# Patient Record
Sex: Female | Born: 1960
Health system: Southern US, Community
[De-identification: ages and names within clinical notes are randomized; demographics above are authoritative.]

## PROBLEM LIST (undated history)

## (undated) DIAGNOSIS — E785 Hyperlipidemia, unspecified: Secondary | ICD-10-CM

## (undated) DIAGNOSIS — M545 Low back pain: Secondary | ICD-10-CM

## (undated) DIAGNOSIS — M25519 Pain in unspecified shoulder: Secondary | ICD-10-CM

## (undated) DIAGNOSIS — G8929 Other chronic pain: Secondary | ICD-10-CM

## (undated) DIAGNOSIS — M25561 Pain in right knee: Secondary | ICD-10-CM

## (undated) DIAGNOSIS — E119 Type 2 diabetes mellitus without complications: Secondary | ICD-10-CM

## (undated) DIAGNOSIS — F329 Major depressive disorder, single episode, unspecified: Secondary | ICD-10-CM

## (undated) DIAGNOSIS — W19XXXA Unspecified fall, initial encounter: Secondary | ICD-10-CM

## (undated) DIAGNOSIS — E039 Hypothyroidism, unspecified: Secondary | ICD-10-CM

## (undated) DIAGNOSIS — G43709 Chronic migraine without aura, not intractable, without status migrainosus: Secondary | ICD-10-CM

## (undated) DIAGNOSIS — I10 Essential (primary) hypertension: Secondary | ICD-10-CM

## (undated) DIAGNOSIS — F32A Depression, unspecified: Secondary | ICD-10-CM

## (undated) DIAGNOSIS — M25562 Pain in left knee: Secondary | ICD-10-CM

## (undated) HISTORY — DX: Other chronic pain: G89.29

## (undated) HISTORY — DX: Unspecified fall, initial encounter: W19.XXXA

## (undated) HISTORY — DX: Pain in unspecified shoulder: M25.519

## (undated) HISTORY — DX: Hyperlipidemia, unspecified: E78.5

## (undated) HISTORY — DX: Low back pain: M54.5

## (undated) HISTORY — DX: Depression, unspecified: F32.A

## (undated) HISTORY — DX: Chronic migraine without aura, not intractable, without status migrainosus: G43.709

## (undated) HISTORY — PX: OTHER SURGICAL HISTORY: SHX169

## (undated) HISTORY — DX: Pain in left knee: M25.562

## (undated) HISTORY — DX: Pain in right knee: M25.561

---
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
## 1985-02-01 DIAGNOSIS — IMO0002 Reserved for concepts with insufficient information to code with codable children: Secondary | ICD-10-CM

## 1985-02-01 HISTORY — DX: Reserved for concepts with insufficient information to code with codable children: IMO0002

## 2009-10-08 ENCOUNTER — Emergency Department (HOSPITAL_COMMUNITY): Admission: EM | Admit: 2009-10-08 | Discharge: 2009-10-08 | Payer: Self-pay | Admitting: Emergency Medicine

## 2009-11-17 ENCOUNTER — Encounter: Admission: RE | Admit: 2009-11-17 | Discharge: 2009-11-17 | Payer: Self-pay | Admitting: Infectious Diseases

## 2009-11-25 ENCOUNTER — Emergency Department (HOSPITAL_COMMUNITY): Admission: EM | Admit: 2009-11-25 | Discharge: 2009-11-25 | Payer: Self-pay | Admitting: Family Medicine

## 2009-12-08 ENCOUNTER — Emergency Department (HOSPITAL_COMMUNITY): Admission: EM | Admit: 2009-12-08 | Discharge: 2009-12-08 | Payer: Self-pay | Admitting: Family Medicine

## 2010-03-09 ENCOUNTER — Inpatient Hospital Stay (INDEPENDENT_AMBULATORY_CARE_PROVIDER_SITE_OTHER)
Admission: RE | Admit: 2010-03-09 | Discharge: 2010-03-09 | Disposition: A | Discharge status: Home / Self Care | Payer: Medicaid Other | Source: Ambulatory Visit | Attending: Family Medicine | Admitting: Family Medicine

## 2010-03-19 ENCOUNTER — Inpatient Hospital Stay (INDEPENDENT_AMBULATORY_CARE_PROVIDER_SITE_OTHER)
Admission: RE | Admit: 2010-03-19 | Discharge: 2010-03-19 | Disposition: A | Discharge status: Home / Self Care | Payer: Medicaid Other | Source: Ambulatory Visit | Attending: Family Medicine | Admitting: Family Medicine

## 2010-03-19 DIAGNOSIS — M719 Bursopathy, unspecified: Secondary | ICD-10-CM

## 2010-05-11 ENCOUNTER — Inpatient Hospital Stay (INDEPENDENT_AMBULATORY_CARE_PROVIDER_SITE_OTHER)
Admission: RE | Admit: 2010-05-11 | Discharge: 2010-05-11 | Disposition: A | Discharge status: Home / Self Care | Payer: Self-pay | Source: Ambulatory Visit | Attending: Family Medicine | Admitting: Family Medicine

## 2010-05-11 DIAGNOSIS — D485 Neoplasm of uncertain behavior of skin: Secondary | ICD-10-CM

## 2010-05-11 DIAGNOSIS — M67919 Unspecified disorder of synovium and tendon, unspecified shoulder: Secondary | ICD-10-CM

## 2010-10-30 ENCOUNTER — Inpatient Hospital Stay (INDEPENDENT_AMBULATORY_CARE_PROVIDER_SITE_OTHER)
Admission: RE | Admit: 2010-10-30 | Discharge: 2010-10-30 | Disposition: A | Discharge status: Home / Self Care | Payer: Self-pay | Source: Ambulatory Visit | Attending: Emergency Medicine | Admitting: Emergency Medicine

## 2010-10-30 DIAGNOSIS — M25519 Pain in unspecified shoulder: Secondary | ICD-10-CM

## 2010-10-30 DIAGNOSIS — M549 Dorsalgia, unspecified: Secondary | ICD-10-CM

## 2010-10-30 DIAGNOSIS — M2550 Pain in unspecified joint: Secondary | ICD-10-CM

## 2010-10-30 LAB — POCT I-STAT, CHEM 8
Glucose, Bld: 85 mg/dL (ref 70–99)
HCT: 42 % (ref 36.0–46.0)
Hemoglobin: 14.3 g/dL (ref 12.0–15.0)
Potassium: 4.1 mEq/L (ref 3.5–5.1)
Sodium: 142 mEq/L (ref 135–145)
TCO2: 22 mmol/L (ref 0–100)

## 2011-07-12 ENCOUNTER — Emergency Department (HOSPITAL_COMMUNITY)
Admission: EM | Admit: 2011-07-12 | Discharge: 2011-07-13 | Disposition: A | Discharge status: Home / Self Care | Payer: Self-pay | Attending: Emergency Medicine | Admitting: Emergency Medicine

## 2011-07-12 ENCOUNTER — Encounter (HOSPITAL_COMMUNITY): Payer: Self-pay | Admitting: *Deleted

## 2011-07-12 DIAGNOSIS — M62838 Other muscle spasm: Secondary | ICD-10-CM | POA: Insufficient documentation

## 2011-07-12 DIAGNOSIS — M542 Cervicalgia: Secondary | ICD-10-CM | POA: Insufficient documentation

## 2011-07-12 DIAGNOSIS — F17200 Nicotine dependence, unspecified, uncomplicated: Secondary | ICD-10-CM | POA: Insufficient documentation

## 2011-07-12 MED ORDER — HYDROCODONE-ACETAMINOPHEN 5-325 MG PO TABS
1.0000 | ORAL_TABLET | Freq: Once | ORAL | Status: AC
  Administered 2011-07-12: 1 via ORAL
  Filled 2011-07-12: qty 1

## 2011-07-12 NOTE — ED Notes (Addendum)
C/o headache and neck pain, onset this am, (denies: injury, fever, dizziness, nvd, visual changes or other sx). No relief with ASA. No h/o same. (Declines translator at this time, son with pt and will translate). "Takes meloxicam for mouth and joint pain". Pinpoints pain to bilateral occipital & parietal areas.

## 2011-07-13 ENCOUNTER — Encounter (HOSPITAL_COMMUNITY): Payer: Self-pay | Admitting: *Deleted

## 2011-07-13 ENCOUNTER — Emergency Department (HOSPITAL_COMMUNITY): Payer: Self-pay

## 2011-07-13 ENCOUNTER — Emergency Department (HOSPITAL_COMMUNITY)
Admission: EM | Admit: 2011-07-13 | Discharge: 2011-07-13 | Disposition: A | Discharge status: Home / Self Care | Payer: Self-pay | Attending: Emergency Medicine | Admitting: Emergency Medicine

## 2011-07-13 DIAGNOSIS — R51 Headache: Secondary | ICD-10-CM | POA: Insufficient documentation

## 2011-07-13 DIAGNOSIS — R110 Nausea: Secondary | ICD-10-CM | POA: Insufficient documentation

## 2011-07-13 DIAGNOSIS — H53149 Visual discomfort, unspecified: Secondary | ICD-10-CM | POA: Insufficient documentation

## 2011-07-13 DIAGNOSIS — Z7982 Long term (current) use of aspirin: Secondary | ICD-10-CM | POA: Insufficient documentation

## 2011-07-13 MED ORDER — MECLIZINE HCL 32 MG PO TABS: 32.0000 mg | ORAL_TABLET | Freq: Three times a day (TID) | ORAL | Status: AC | PRN

## 2011-07-13 MED ORDER — DIPHENHYDRAMINE HCL 50 MG/ML IJ SOLN
25.0000 mg | Freq: Once | INTRAMUSCULAR | Status: AC
  Administered 2011-07-13: 25 mg via INTRAVENOUS
  Filled 2011-07-13: qty 1

## 2011-07-13 MED ORDER — IBUPROFEN 800 MG PO TABS: 800.0000 mg | ORAL_TABLET | Freq: Three times a day (TID) | ORAL | Status: AC

## 2011-07-13 MED ORDER — OXYCODONE-ACETAMINOPHEN 5-325 MG PO TABS: 2.0000 | ORAL_TABLET | ORAL | Status: DC | PRN

## 2011-07-13 MED ORDER — DIAZEPAM 5 MG PO TABS: 5.0000 mg | ORAL_TABLET | Freq: Three times a day (TID) | ORAL | Status: DC | PRN

## 2011-07-13 MED ORDER — MECLIZINE HCL 25 MG PO TABS
25.0000 mg | ORAL_TABLET | Freq: Once | ORAL | Status: AC
  Administered 2011-07-13: 25 mg via ORAL
  Filled 2011-07-13: qty 1

## 2011-07-13 MED ORDER — DIAZEPAM 5 MG PO TABS
5.0000 mg | ORAL_TABLET | Freq: Once | ORAL | Status: AC
  Administered 2011-07-13: 5 mg via ORAL
  Filled 2011-07-13: qty 1

## 2011-07-13 MED ORDER — DEXAMETHASONE SODIUM PHOSPHATE 10 MG/ML IJ SOLN
10.0000 mg | Freq: Once | INTRAMUSCULAR | Status: AC
  Administered 2011-07-13: 10 mg via INTRAVENOUS
  Filled 2011-07-13: qty 1

## 2011-07-13 MED ORDER — METOCLOPRAMIDE HCL 5 MG/ML IJ SOLN
10.0000 mg | Freq: Once | INTRAMUSCULAR | Status: AC
  Administered 2011-07-13: 10 mg via INTRAVENOUS
  Filled 2011-07-13: qty 2

## 2011-07-13 MED ORDER — IBUPROFEN 800 MG PO TABS
800.0000 mg | ORAL_TABLET | Freq: Once | ORAL | Status: AC
  Administered 2011-07-13: 800 mg via ORAL
  Filled 2011-07-13 (×2): qty 1

## 2011-07-13 NOTE — ED Provider Notes (Signed)
History     CSN: [id number]  Arrival date & time 07/13/11  1949   First MD Initiated Contact with Patient 07/13/11 2104      Chief Complaint  Patient presents with  . Headache    HPI  Hx provided by pt through a [place] interpreter.  Patient is a 51 year old female with prior history of migraine type headaches who presents with complaints of headache and dizziness. Patient states she was seen yesterday for similar symptoms of headache. Patient was treated and felt much better and returned home. Today headache has returned and is associated with symptoms of slight dizziness. Patient states she feels off balance as the surroundings were spinning. Symptoms are worse with movements and standing and walking. She does have some improvement with rest. Patient has long history of migraine type headaches for many years. Pain feels the same and was gradual. Patient denies any associated fever, chills, confusion, neck pain or stiffness, rash. Patient denies any speech change, weakness or paralysis in extremities. Patient has not taken anything for her symptoms. Symptoms are described as severe.    History reviewed. No pertinent past medical history.  History reviewed. No pertinent past surgical history.  No family history on file.  History  Substance Use Topics  . Smoking status: Current Some Day Smoker  . Smokeless tobacco: Not on file  . Alcohol Use: No    OB History    Grav Para Term Preterm Abortions TAB SAB Ect Mult Living                  Review of Systems  Constitutional: Negative for fever and chills.  Eyes: Positive for photophobia. Negative for pain, redness and visual disturbance.  Gastrointestinal: Positive for nausea. Negative for vomiting.  Skin: Negative for rash.  Neurological: Positive for dizziness and headaches. Negative for speech difficulty, weakness, light-headedness and numbness.  Psychiatric/Behavioral: Negative for confusion.    Allergies  Review of  patient's allergies indicates no known allergies.  Home Medications   Current Outpatient Rx  Name Route Sig Dispense Refill  . ASPIRIN 325 MG PO TABS Oral Take 650 mg by mouth every 6 (six) hours as needed. For headache    . DIAZEPAM 5 MG PO TABS Oral Take 5 mg by mouth every 8 (eight) hours as needed. For anxiety    . IBUPROFEN 800 MG PO TABS Oral Take 1 tablet (800 mg total) by mouth 3 (three) times daily. 21 tablet 0  . OXYCODONE-ACETAMINOPHEN 5-325 MG PO TABS Oral Take 2 tablets by mouth every 4 (four) hours as needed. For pain      BP 108/70  Pulse 56  Temp(Src) 97.4 F (36.3 C) (Oral)  Resp 18  SpO2 97%  LMP 06/27/2011  Physical Exam  Nursing note and vitals reviewed. Constitutional: She is oriented to person, place, and time. She appears well-developed and well-nourished. No distress.  HENT:  Head: Normocephalic and atraumatic.  Eyes: Conjunctivae and EOM are normal. Pupils are equal, round, and reactive to light.       No nystagmus.   Neck: Normal range of motion. Neck supple.  Cardiovascular: Normal rate and regular rhythm.   Pulmonary/Chest: Effort normal and breath sounds normal.  Abdominal: Soft.  Neurological: She is alert and oriented to person, place, and time. She has normal strength. No cranial nerve deficit or sensory deficit. Gait normal.  Skin: Skin is warm and dry. No rash noted.  Psychiatric: She has a normal mood and affect. Her behavior is  normal.    ED Course  Procedures    Ct Head Wo Contrast  07/13/2011  *RADIOLOGY REPORT*  Clinical Data: Headache  CT HEAD WITHOUT CONTRAST  Technique:  Contiguous axial images were obtained from the base of the skull through the vertex without contrast.  Comparison: None.  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 There is no evidence for acute hemorrhage, hydrocephalus, mass lesion, or abnormal extra-axial fluid collection.  No  definite CT evidence for acute infarction.  The visualized paranasal sinuses and mastoid air cells are predominately clear.  IMPRESSION: Mild white matter changes as above.  No definite acute intracranial abnormality.  Original Report Authenticated By: Waneta Martins, M.D.     1. Headache       MDM  Patient seen and evaluated. Patient no acute distress.   Patient feeling much better after medications. Patient ambulating well and on own without assistance     Angus Seller, [place] 07/15/11 [phone]

## 2011-07-13 NOTE — Discharge Instructions (Signed)
Muscle Spasm  Take medications as prescribed.  Warm moist heat, either from warm bath, hot water bottle, or heating pad to the spasm area will help with symptoms.  Expect to be sore for 7-10 days.  Follow up with your regular doctor.  If you do not have a doctor, follow up with one of the numbers listed.  Return to the ER for worsening pain, new weakness, numbness, loss of bowel or bladder control, or other concerning symptoms.  PSYCH ANXIOLYTICS BENZODIAZEPINES  PSYCH ANXIOLYTICS BENZODIAZEPINES: You have been prescribed a medication that belongs to a class called Benzodiazepines.      These medicines are used to treat anxiety (nervousness), tremors, seizures, vertigo, insomnia, nausea (especially that associated with chemotherapy), alcohol or sedative drug withdrawal, and muscle spasm; they may also be given (usually intravenously) in the ED for sedation during procedures. This medication is a "scheduled substance" that means it is against the law to share it or give it to anyone else.     You have been given a medication, or a prescription for a medication, that causes drowsiness or dizziness.  DO NOT drive a car, operate machinery, ride a bike, or perform jobs that require you to be alert until you know how you are going to react to this medicine.     Make sure your doctor knows if you have any of these conditions before you take this medication:  an alcohol or drug abuse problem, depression, glaucoma, kidney or liver disease, lung disease or breathing difficulties, myasthenia gravis, Parkinson's disease.     If you are on any of the following medications make sure that your doctor knows before you start this medication as they can cause some undesirable interactions:  Seizure medicines (used for convulsion or epilepsy), chloroquine, cimetidine (Tagamet), digoxin (Lanoxin), disulfiram (Antabuse), or erythromycin.     DO NOT drink alcoholic beverages while taking this medicine.     If you  become dizzy, sit or lie down at the first signs.  You should be careful going up and down stairs.     This medication may cause side-effects.  If they are bothersome, discontinue the medication.  If they are severe, follow-up with your physician or return to the Emergency Department for a recheck.  These side-effects include:  dizziness, depression, headaches, blurry vision, and problems sleeping. Tell your doctor if you are taking any of the following medicines:      DO NOT drink alcoholic beverages while taking this medicine.     Medications for your stomach, Cyclosporin, Medications for your heart or blood pressure, Diflucan, Theophylline, Isoniazid, Antibiotics, Migraine medicines, Medications for seizures, depression, or mental illness.     If you become dizzy, sit or lie down at the first signs.  You should be careful going up and down stairs. DO NOT take more of this medicine than prescribed.  Taking too much of this medicine can cause DEATH.      If you miss a dose do not "double up."  DO NOT take extra doses as this will not help you feel any better any faster.  It may even cause unwanted side-effects.     Contact your doctor immediately if you develop an allergic reaction, you feel lightheaded, confused, drowsy, or experience thoughts of hurting yourself or others.  Call also if you experience yellowing of the eyes or skin, or abnormal muscle twitching or movements.     DO NOT take this medication if you are pregnant or are nursing   or you are actively trying to become pregnant.     Keep this medication out of the reach of children.  Always keep this medication in child-proof containers.  DO NOT give your medication to anyone else. This drug may be habit-forming (addictive).  DO NOT use it for more than one week without discussing it with your regular doctor.  You have been given a medication, or a prescription for a medication, that causes drowsiness or dizziness.  DO NOT drive a  car, operate machinery, ride a bike, or perform jobs that require you to be alert until you know how you are going to react to this medicine.  THESE INSTRUCTIONS ARE NOT COMPREHENSIVE (complete):  Ask your pharmacist for additional information and precautions for this medication.   PAIN NSAID MOTRIN  PAIN NSAID MOTRIN: You have been given a medication that contains ibuprofen.     This medication is often used to relieve pain, reduce fever, reduce inflammation, or to help prevent the ureteral spasm and pain associated with kidney stones.    DO NOT take this medication if you  have stomach ulcers or are sensitive / allergic to ibuprofen.    DO NOT take this medication if you are taking other over-the-counter medications that contain ibuprofen.  Never take more of the medication than prescribed.  Overdosing of medication may cause damage to your kidneys.    If you have side-effects that you think are caused by this medicine, tell your doctor.  If you develop stomach pain, vomit blood, or have bowel movements that become black and tarry, discontinue the medication and notify your physician immediately.    This medication may upset your stomach.  Always take medication with milk or meals.    Keep this medication out of the reach of children.  Always keep this medication in child-proof containers.  DO NOT give your medication to anyone else. THESE INSTRUCTIONS ARE NOT COMPREHENSIVE (complete):  Ask your pharmacist for additional information and precautions for this medication.   PAIN ACETAMINOPHEN OXYCODONE  PAIN ACETAMINOPHEN OXYCODONE: You have been given a medication that contains acetaminophen and oxycodone.      This medication is used to relieve pain.     DO NOT take this medication if you have liver disease or drink alcohol on a daily basis.     DO NOT take this medication if you are taking other over-the-counter medications that contain Tylenol or acetaminophen (the active  ingredient in Tylenol).     If you have side-effects that you think are caused by this medicine, tell your doctor.     DO NOT drink alcoholic beverages while taking this medicine.     If you become dizzy, sit or lie down at the first signs.  You should be careful going up and down stairs.     If you are pregnant or breastfeeding, notify your doctor before taking this medication.     Keep this medication out of the reach of children.  Always keep this medication in child-proof containers.  DO NOT give your medication to anyone else. This medication can be HABIT-FORMING.  Discontinue use when no longer needed and never give this medication to others.  You have been given a medication, or a prescription for a medication, that causes drowsiness or dizziness.  DO NOT drive a car, operate machinery, or perform jobs that require you to be alert until you know how you are going to react to this medicine.  THESE INSTRUCTIONS ARE NOT COMPREHENSIVE (  complete):  Ask your pharmacist for additional information and precautions for this medication.   PAIN, GENERAL - WITH PAIN MEDICATION  PAIN, GENERAL: You have been seen today for treatment of your pain.  The physician who treated you did not feel that the cause of your pain was dangerous and felt it was OK to treat your symptoms.  You will be given a prescription for pain medication to treat your pain. Use the pain medication as needed for discomfort. It may be to your advantage to take the pain medications regularly, around the clock as directed for the next a few days. By doing this, you can build up a helpful amount of the medicine in your system.  YOU SHOULD SEEK MEDICAL ATTENTION IMMEDIATELY, EITHER HERE OR AT THE NEAREST EMERGENCY DEPARTMENT, IF ANY OF THE FOLLOWING OCCURS:      Your pain becomes worse, despite treatment with pain medications.     You develop any other significant symptoms.  MUSCLE STRAIN, GENERAL  MUSCLE STRAIN,  GENERAL: You have been diagnosed with a muscle strain.  Any muscle in the body can be strained. A strain is an injury to muscles where some of the muscle fibers are injured by being stretched or partially torn. This usually happens by overusing the muscle or performing an activity that the muscle is not used to doing.  Some of the symptoms of a strain include pain, muscle cramping, and soreness to the touch.  Often, the pain and stiffness in the muscle is worse the next day. This is much like what happens when a person begins exercising for the first time. After the exercise session, the person may feel pretty good, however the next day all of the exercised muscles feel stiff and sore.  The general treatment for a strain includes the following:      Resting the affected part.     Pain medication.     Muscle relaxant medications.     Warm compresses (such as a warm, moist towel).     Gentle stretching of the injured muscle.     And when tolerated, gentle massage of the affected area. This injury is self-limited (it gets better on its own) and rarely requires specific treatment.  YOU SHOULD SEEK MEDICAL ATTENTION IMMEDIATELY, EITHER HERE OR AT THE NEAREST EMERGENCY DEPARTMENT, IF ANY OF THE FOLLOWING OCCURS:      Significant increase in swelling of the affected area.     Worsening pain instead of gradual improvement.     Redness of the skin over the affected area.     Inability to use the affected limb. Weakness or numbness of the limb.  IMPORTANCE OF PRIMARY CARE DOCTOR (EDU)  IMPORTANCE OF PRIMARY CARE DOCTOR (EDU): You have been given instructions to follow up with a primary care physician.  A primary care physician is a doctor who helps with your health maintenance. For example, he or she provides yearly health exams to help determine your general well-being along with regular check-ups to help to identify potential health problems.  Your primary care physician serves as  a main resource on all aspects of your health. In addition to treating existing medical conditions, this physician monitors your health over time. Your primary doctor can help you to recognize symptoms, or changes in your body that could be signs of new illness. Primary care physicians can look at the big picture, including your lifestyle and family history. They can help plan the best ways of staying healthy and   leading a long, productive life. They are also an important part in making referrals to specialists (such as doctors who specialize in specific disease conditions such as diabetes, heart disease, etc.).  There are many types of physicians who provide primary care. They all offer the benefits of a lasting, personal relationship based upon mutual trust and a thorough knowledge of an individual person. They also provide a wide range of healthcare services.      Family Medicine physicians provide comprehensive care for all family members, from newborns through older adults.     Internal Medicine physicians specialize in meeting the complete healthcare needs of adults, from teenagers through seniors, providing both primary and advanced levels of care.     Obstetrician/gynecologists often serve as primary physicians for women, performing routine physicals and health screenings in addition to obstetrical and gynecological care.     Pediatricians are experts in primary care for children, usually from infancy through the teen years. Primary care doctors may be either MDs or DOs. With today's modern medical training, the differences between an MD (Medical Doctor) and a D.O. (Doctor of Osteopathic Medicine) are minimal. Both MD's and DOs go to medical school and complete residencies in various medical specialties.  If you do not have a primary care physician, it takes a little homework and determination on your part. There are several options available in selecting the most appropriate doctor for your  care. There are referrals lines in your local area as well as specialists that work with your specific health care plan. Many people find a physician through word-of-mouth, asking their friends, neighbors or relatives. There are also referral lines in your local area. Hospital physician referral services are also another option. Your health care plan may also offer referral services and most health plans offer the "Ask A Nurse" service. Referral services offer backgrounds of potential physicians, their educational and practice history, age range, office locations and hours, and the types of insurance coverage that they accept.  When you have decided which doctor may be right for you, make an appointment to ask questions about issues that are important to you. Frequently asked questions include the following:      Is the doctor on staff at a hospital? Which hospital?     What is the doctor's educational background?     Does the doctor specialize in certain areas of medicine?     How many years has his or her practice been established?     Is the doctor in practice by himself or herself, or in a group practice?     Is his or her office conveniently located?     What hours are available for appointments?     What types of insurance coverage does the doctor accept?     If you're on Medicare or Medicaid, does the doctor accept these plans?     How far in advance do you have to make an appointment? Are same-day appointments available?     How does the doctor handle situations when you need to see a doctor urgently?     What is the doctor's fee schedule? When is payment expected and how can it be made? When seeing a patient for the first time in a non-emergency situation, most doctors will begin a medical chart. This chart includes information about your health history. This record should include your present state of health, personal statistics (age, height, weight, occupation, whether you're a  smoker or non-smoker), and your   family history.  Establishing a GOOD RAPPORT (relationship) with your family doctor is EXTREMELY important! A PCP (Primary Care Physician) is the cornerstone of your care and should be the first person you call with any health concerns or problems. Being an established patient is VERY important so that you can be seen quickly when an illness or injury does occur. Plan ahead and make an appointment with your chosen physician to become an established patient of his or her practice.  If you develop symptoms of Shortness of Breath, Chest Pain, Swelling of lips, mouth or tongue or if your condition becomes worse with any new symptoms, see your doctor or return to the Emergency Department for immediate care. Emergency services are not intended to be a substitute for comprehensive medical attention.  Please contact your doctor for follow up if not improving as expected.   Call your doctor in 5-7 days or as directed if there is no improvement.   Community Resources: *IF YOU ARE IN IMMEDIATE DANGER CALL 911!  Abuse/Neglect:  Family Services Crisis Hotline ([place]): (336) 273-7273 [facility] Against Violence ([place]): (336) 342-3331  After hours, holidays and weekends: (336) 643-3000 National Domestic Violence Hotline: 800 799-7233  Mental Health: [place] [facility]: [address]: (336) 641-4993  Health Clinics:  Urgent [facility] Center ([facility]): [phone] Monday - Friday 8 AM - 9 PM, Saturday and Sunday 10 AM - 9 PM  [facility] [place]: [phone] Monday - Friday 8 AM - 5 PM  [facility]  [address]: [phone] Monday- Friday 8:30 AM - 5:30 PM, Sat 9 AM - 1 PM  [facility] [address]: [phone] [facility] [address]: [phone] [facility] [address]: [phone]  [facility]: [address] [phone]  [phone]  Cultures: If culture results are positive, we will notify you if a change in treatment is necessary.  LABORATORY TESTS:         If you had any labs drawn in the ED that have not resulted by the time you are discharged home, we will review these lab results and the treatment given to you.  If there is any further treatment or notification needed, we will contact you by phone, or letter.  "PLEASE ENSURE THAT YOU HAVE GIVEN US YOUR CURRENT WORKING PHONE NUMBER AND YOUR CURRENT ADDRESS, so that we can contact you if needed."  RADIOLOGY TESTS:  If the referred physician wants today's x-rays, please call the hospital's Radiology Department the day before your doctor's appointment. [place]     [phone] [place]   [phone] [place]     [phone]  Our doctors and staff appreciate your choosing us for your emergency medical care needs. We are here to serve you. 

## 2011-07-13 NOTE — ED Notes (Signed)
Headache for 2 days.  She was seen here in the ed for the same

## 2011-07-13 NOTE — Discharge Instructions (Signed)
You were seen and evaluated for your headache.  At this time your providers do not feel your symptoms are caused from any serious or concerning condition.  You may return home and follow up with your primary care provider.  If your develop any worsening symptoms or new concerning symptoms return to the emergency room.     Headache, General, Unknown Cause The specific cause of your headache may not have been found today. There are many causes and types of headache. A few common ones are:  Tension headache.   Migraine.   Infections (examples: dental and sinus infections).   Bone and/or joint problems in the neck or jaw.   Depression.   Eye problems.  These headaches are not life threatening.  Headaches can sometimes be diagnosed by a patient history and a physical exam. Sometimes, lab and imaging studies (such as x-ray and/or CT scan) are used to rule out more serious problems. In some cases, a spinal tap (lumbar puncture) may be requested. There are many times when your exam and tests may be normal on the first visit even when there is a serious problem causing your headaches. Because of that, it is very important to follow up with your doctor or local clinic for further evaluation. FINDING OUT THE RESULTS OF TESTS  If a radiology test was performed, a radiologist will review your results.   You will be contacted by the emergency department or your physician if any test results require a change in your treatment plan.   Not all test results may be available during your visit. If your test results are not back during the visit, make an appointment with your caregiver to find out the results. Do not assume everything is normal if you have not heard from your caregiver or the medical facility. It is important for you to follow up on all of your test results.  HOME CARE INSTRUCTIONS   Keep follow-up appointments with your caregiver, or any specialist referral.   Only take over-the-counter  or prescription medicines for pain, discomfort, or fever as directed by your caregiver.   Biofeedback, massage, or other relaxation techniques may be helpful.   Ice packs or heat applied to the head and neck can be used. Do this three to four times per day, or as needed.   Call your doctor if you have any questions or concerns.   If you smoke, you should quit.  SEEK MEDICAL CARE IF:   You develop problems with medications prescribed.   You do not respond to or obtain relief from medications.   You have a change from the usual headache.   You develop nausea or vomiting.  SEEK IMMEDIATE MEDICAL CARE IF:   If your headache becomes severe.   You have an unexplained oral temperature above 102 F (38.9 C), or as your caregiver suggests.   You have a stiff neck.   You have loss of vision.   You have muscular weakness.   You have loss of muscular control.   You develop severe symptoms different from your first symptoms.   You start losing your balance or have trouble walking.   You feel faint or pass out.  MAKE SURE YOU:   Understand these instructions.   Will watch your condition.   Will get help right away if you are not doing well or get worse.  Document Released: 01/18/2005 Document Revised: 01/07/2011 Document Reviewed: 09/07/2007 [facility] Patient Information 2012 [place], [place].  RESOURCE GUIDE  Chronic Pain Problems: Contact [facility]  [phone] Patients need to be referred by their primary care doctor.  Insufficient Money for Medicine: Contact United Way:  call "211" or [facility] [phone].  No Primary Care Doctor: - Call [facility]  [phone] - can help you locate a primary care doctor that  accepts your insurance, provides certain services, etc. - Physician Referral Service- [phone]  Agencies that provide inexpensive medical care: - [facility]  [phone] - [facility]   [phone] - [facility]  [phone] - Women's Clinic  [phone] - Planned Parenthood  [phone] [facility]  [phone]  Medicaid-accepting [facility] [place] Providers: - [facility]- [address]  [phone], Mon-Fri 9am-7pm, Sat 9am-1pm - [facility]- [address] [place], [address] [place]  [phone] - [facility] [address] [place]  [phone] [facility]- [address]  [phone] - Renaye Rakers- [address] [place], [address], [phone]  Only accepts [place] patients after they have their name  applied to their card  Self Pay (no insurance) in [place]: - Sickle Cell Patients: Dr Willey Blade, [facility] Internal [facility]  [address] [place], [phone] - [facility]- [address] [place]  [phone]       [facility] [place]- [address]       -     [facility]- see information above (Speak to [employer] if you do not have insurance)       -  [facility]- [address] [place], [phone]       -  [facility]- [address],  [phone]       -  [facility]- [address] [phone]       -  Dr Julio Sicks-  [address], [place], [phone]       -  [facility]- [address] [phone]       -  [facility]- [address], [phone], also [address] [phone]       -    [facility]- [address] [place], [phone], 1st & 3rd Saturday   every month, 10am-1pm  1) Find a Doctor and Pay Out of Pocket Although you won't have to find out who is covered by your insurance plan, it is a good idea to ask around and get recommendations. You will then need to call the office and see if the doctor you have chosen will accept you as a new patient and what types of options they offer for patients who are self-pay.  Some doctors offer discounts or will set up payment plans for their patients who do not have insurance, but you will need to ask so you aren't surprised when you get to your appointment.  2) Contact Your Local Health Department Not all health departments have doctors that can see patients for sick visits, but many do, so it is worth a call to see if yours does. If you don't know where your local health department is, you can check in your phone book. The CDC also has a tool to help you locate your state's health department, and many state websites also  have listings of all of their local health departments.  3) Find a Walk-in Clinic If your illness is not likely to be very severe or complicated, you may want to try a walk in clinic. These are popping up all over the country in pharmacies, drugstores, and shopping centers. They're usually staffed by nurse practitioners or physician assistants that have been trained to treat common illnesses and complaints. They're usually fairly quick and inexpensive. However, if you have serious medical issues or chronic medical problems, these are probably not your best option  STD Testing - [facility] Department of [facility], STD Clinic, [address], [place], phone [phone] or [phone].  Monday - Friday, call for an appointment. [facility] Department of [employer], STD Clinic, [address], [place], phone [phone] or [phone].  Monday - Friday, call for an appointment.  Abuse/Neglect: [facility] Child Abuse Hotline 915 790 4646 [facility] Child Abuse Hotline (267) 205-2676 (After Hours)  Emergency Shelter:  [facility] 2165060797  Maternity Homes: - [facility] [place] [facility] [phone] - [facility] 8545324285  MRSA Hotline #:   310-732-6061  [facility] Resources  [facility] [place]  [facility] Dept. [address]                 [address]         [address] [place] [address]  Blondell Reveal Phone:  [phone]                                  Phone:  [phone]                   Phone:  [phone]  [facility] Mental Health, [phone] - [phone]       -     [facility] in [place], [address]                                  [phone], [facility] Child Abuse Hotline 978-337-3174 or 225-271-2191 (After Hours)   Behavioral Health Services  Substance Abuse Resources: - [facility] Drug [facility]  [phone] - [facility] [phone] - [facility] [place] [phone] Floydene Flock (725)[phone] - Residential & Outpatient Substance Abuse Program  ([phone]  Psychological Services: [facility]  [phone] Services  [phone] - [facility], [phone] [place]. [address], [place], ACCESS LINE: [phone] or 9592225346, [employer]  Dental Assistance  If unable to pay or uninsured, contact:  [facility] or [facility]. to become qualified for the adult dental clinic.  Patients with Medicaid: [facility] [phone] [address], [phone] [address] [phone]  If unable  to pay, or uninsured, contact [facility] 7203906994) or [facility] Department [phone] in [place], [phone] in [facility]) to become qualified for the adult dental clinic  Other Low-Cost Community Dental Services: - [facility]- [address] [place], [place], [place], [address], [phone], [phone], 2nd and 4th Thursday of the month at 6:30am.  10 clients each day by appointment, can sometimes see walk-in patients if someone does not show for  an appointment. [facility]- [address] [place] Griffins [place], [place], [address], [phone] - [facility]- [address], [place], [place], [address], [phone] - [facility]- [phone] [facility]- [phone] [facility] Department- [phone]

## 2011-07-13 NOTE — ED Notes (Signed)
Pt ambulated in hall with out assistance.  C/o slight HA per her son as pt is non english speaking.

## 2011-07-16 NOTE — ED Provider Notes (Signed)
Medical screening examination/treatment/procedure(s) were performed by non-physician practitioner and as supervising physician I was immediately available for consultation/collaboration.  Juliet Rude. Rubin Payor, MD 07/16/11 [phone]

## 2011-07-16 NOTE — ED Provider Notes (Signed)
History     CSN: [id number]  Arrival date & time 07/12/11  2146   First MD Initiated Contact with Patient 07/13/11 0052      Chief Complaint  Patient presents with  . Headache  . Neck Pain    (Consider location/radiation/quality/duration/timing/severity/associated sxs/prior treatment) HPI 51 yo female presents to the ER with complaint of right sided neck pain and headache.  Pt has take meloxicam for pain.  Pt does not speak english, family member at bedside translating.  Pt refuses phone translator.  No fever, chills, no sore throat, uri sxs.  Pain with movement of the next but no rigidity.  No trauma. History reviewed. No pertinent past medical history.  History reviewed. No pertinent past surgical history.  History reviewed. No pertinent family history.  History  Substance Use Topics  . Smoking status: Current Some Day Smoker  . Smokeless tobacco: Not on file  . Alcohol Use: No    OB History    Grav Para Term Preterm Abortions TAB SAB Ect Mult Living                  Review of Systems  Unable to perform ROS: Other    Allergies  Review of patient's allergies indicates no known allergies.  Home Medications   Current Outpatient Rx  Name Route Sig Dispense Refill  . ASPIRIN 325 MG PO TABS Oral Take 650 mg by mouth every 6 (six) hours as needed. For headache    . DIAZEPAM 5 MG PO TABS Oral Take 5 mg by mouth every 8 (eight) hours as needed. For anxiety    . IBUPROFEN 800 MG PO TABS Oral Take 1 tablet (800 mg total) by mouth 3 (three) times daily. 21 tablet 0  . MECLIZINE HCL 32 MG PO TABS Oral Take 1 tablet (32 mg total) by mouth 3 (three) times daily as needed. 30 tablet 0  . OXYCODONE-ACETAMINOPHEN 5-325 MG PO TABS Oral Take 2 tablets by mouth every 4 (four) hours as needed. For pain      BP 140/59  Pulse 57  Temp 98.1 F (36.7 C) (Oral)  Resp 18  SpO2 99%  LMP 06/27/2011  Physical Exam  Nursing note and vitals reviewed. Constitutional: She is oriented  to person, place, and time. She appears well-developed and well-nourished.  HENT:  Head: Normocephalic and atraumatic.  Right Ear: External ear normal.  Left Ear: External ear normal.  Nose: Nose normal.  Mouth/Throat: Oropharynx is clear and moist.  Eyes: Conjunctivae and EOM are normal. Pupils are equal, round, and reactive to light.  Neck: Normal range of motion. Neck supple. No JVD present. No tracheal deviation present. No thyromegaly present.       Tenderness with palpation of SCM, trapezius muscles bilaterally no midline tenderness or crepitus, no nuchal rigidity.  Cardiovascular: Normal rate, regular rhythm, normal heart sounds and intact distal pulses.  Exam reveals no gallop and no friction rub.   No murmur heard. Pulmonary/Chest: Effort normal and breath sounds normal. No stridor. No respiratory distress. She has no wheezes. She has no rales. She exhibits no tenderness.  Abdominal: Soft. Bowel sounds are normal. She exhibits no distension and no mass. There is no tenderness. There is no rebound and no guarding.  Musculoskeletal: Normal range of motion. She exhibits no edema and no tenderness.  Lymphadenopathy:    She has no cervical adenopathy.  Neurological: She is oriented to person, place, and time. She has normal reflexes. No cranial nerve deficit. She  exhibits normal muscle tone. Coordination normal.  Skin: Skin is dry. No rash noted. No erythema. No pallor.  Psychiatric: She has a normal mood and affect. Her behavior is normal. Judgment and thought content normal.    ED Course  Procedures (including critical care time)  Labs Reviewed - No data to display No results found.   1. Trapezius muscle spasm   2. Neck pain       MDM  Pt feeling better after medications, wishes to go home.  Will have her f/u with pcm       Olivia Mackie, MD 07/16/11 1529

## 2011-07-20 NOTE — ED Notes (Addendum)
Medication strength changed to Antivert 25 mg TID  Per Roselyn Meier. Called by Purnell Shoemaker PFM to Pharmacy

## 2012-07-17 ENCOUNTER — Encounter (HOSPITAL_COMMUNITY): Payer: Self-pay

## 2012-07-17 ENCOUNTER — Emergency Department (HOSPITAL_COMMUNITY)
Admission: EM | Admit: 2012-07-17 | Discharge: 2012-07-17 | Disposition: A | Discharge status: Home / Self Care | Payer: No Typology Code available for payment source | Source: Home / Self Care | Attending: Family Medicine | Admitting: Family Medicine

## 2012-07-17 DIAGNOSIS — G43909 Migraine, unspecified, not intractable, without status migrainosus: Secondary | ICD-10-CM

## 2012-07-17 MED ORDER — AMITRIPTYLINE HCL 50 MG PO TABS: 50.0000 mg | ORAL_TABLET | Freq: Every day | ORAL | Status: DC

## 2012-07-17 MED ORDER — METHYLPREDNISOLONE ACETATE 40 MG/ML IJ SUSP
80.0000 mg | Freq: Once | INTRAMUSCULAR | Status: AC
  Administered 2012-07-17: 80 mg via INTRAMUSCULAR

## 2012-07-17 MED ORDER — METHYLPREDNISOLONE ACETATE 80 MG/ML IJ SUSP
INTRAMUSCULAR | Status: AC
  Filled 2012-07-17: qty 1

## 2012-07-17 NOTE — ED Provider Notes (Signed)
History     CSN: [id number]  Arrival date & time 07/17/12  1901   First MD Initiated Contact with Patient 07/17/12 1936      Chief Complaint  Patient presents with  . Headache    (Consider location/radiation/quality/duration/timing/severity/associated sxs/prior treatment) Patient is a 52 y.o. female presenting with headaches. The history is provided by the patient.  Headache Pain location:  Generalized Quality:  Stabbing Radiates to:  Does not radiate Progression:  Unchanged Chronicity:  Chronic Similar to prior headaches: yes (15-20 yrs having headache problem)   Context: bright light   Relieved by:  NSAIDs Ineffective treatments:  None tried Associated symptoms: dizziness and photophobia   Associated symptoms: no back pain, no blurred vision, no fever, no nausea, no neck pain and no sinus pressure     History reviewed. No pertinent past medical history.  History reviewed. No pertinent past surgical history.  History reviewed. No pertinent family history.  History  Substance Use Topics  . Smoking status: Current Some Day Smoker  . Smokeless tobacco: Not on file  . Alcohol Use: No    OB History   Grav Para Term Preterm Abortions TAB SAB Ect Mult Living                  Review of Systems  Constitutional: Negative.  Negative for fever.  HENT: Negative for neck pain and sinus pressure.   Eyes: Positive for photophobia. Negative for blurred vision.  Gastrointestinal: Negative.  Negative for nausea.  Musculoskeletal: Negative for back pain.  Neurological: Positive for dizziness and headaches.    Allergies  Review of patient's allergies indicates no known allergies.  Home Medications   Current Outpatient Rx  Name  Route  Sig  Dispense  Refill  . amitriptyline (ELAVIL) 50 MG tablet   Oral   Take 1 tablet (50 mg total) by mouth at bedtime. As needed for headache   30 tablet   0   . aspirin 325 MG tablet   Oral   Take 650 mg by mouth every 6 (six) hours  as needed. For headache           BP 128/68  Pulse 88  Temp(Src) 98.1 F (36.7 C) (Oral)  Resp 16  SpO2 100%  Physical Exam  Nursing note and vitals reviewed. Constitutional: She is oriented to person, place, and time. She appears well-developed and well-nourished. No distress.  HENT:  Head: Normocephalic.  Right Ear: External ear normal.  Left Ear: External ear normal.  Mouth/Throat: Oropharynx is clear and moist.  Neck: Normal range of motion. Neck supple.  Musculoskeletal: Normal range of motion.  Lymphadenopathy:    She has no cervical adenopathy.  Neurological: She is alert and oriented to person, place, and time. No cranial nerve deficit. Coordination normal.  Skin: Skin is warm and dry.    ED Course  Procedures (including critical care time)  Labs Reviewed - No data to display No results found.   1. Migraine headache       MDM         Linna Hoff, MD 07/17/12 2021

## 2012-07-17 NOTE — ED Notes (Signed)
Reports HA onset today ; sensitivity to light, sounds; has not used any medication for her HA; son here with her

## 2012-08-18 ENCOUNTER — Ambulatory Visit: Payer: No Typology Code available for payment source | Attending: Family Medicine | Admitting: Family Medicine

## 2012-08-18 ENCOUNTER — Encounter: Payer: Self-pay | Admitting: Family Medicine

## 2012-08-18 VITALS — BP 148/84 | HR 91 | Temp 98.2°F | Resp 18 | Wt 150.8 lb

## 2012-08-18 DIAGNOSIS — R519 Headache, unspecified: Secondary | ICD-10-CM | POA: Insufficient documentation

## 2012-08-18 DIAGNOSIS — I10 Essential (primary) hypertension: Secondary | ICD-10-CM | POA: Insufficient documentation

## 2012-08-18 DIAGNOSIS — M545 Low back pain: Secondary | ICD-10-CM | POA: Insufficient documentation

## 2012-08-18 DIAGNOSIS — M542 Cervicalgia: Secondary | ICD-10-CM

## 2012-08-18 DIAGNOSIS — R51 Headache: Secondary | ICD-10-CM | POA: Insufficient documentation

## 2012-08-18 LAB — COMPLETE METABOLIC PANEL WITH GFR
AST: 24 U/L (ref 0–37)
Albumin: 4.3 g/dL (ref 3.5–5.2)
BUN: 12 mg/dL (ref 6–23)
Calcium: 9.2 mg/dL (ref 8.4–10.5)
Chloride: 105 mEq/L (ref 96–112)
Creat: 0.74 mg/dL (ref 0.50–1.10)
GFR, Est African American: >89 mL/min
GFR, Est Non African American: >89 mL/min
Glucose, Bld: 132 mg/dL — ABNORMAL HIGH (ref 70–99)
Potassium: 4.1 mEq/L (ref 3.5–5.3)

## 2012-08-18 LAB — CBC
HCT: 36.4 % (ref 36.0–46.0)
Hemoglobin: 12.3 g/dL (ref 12.0–15.0)
MCV: 79.6 fL (ref 78.0–100.0)
RDW: 14.7 % (ref 11.5–15.5)
WBC: 9.4 10*3/uL (ref 4.0–10.5)

## 2012-08-18 LAB — LIPID PANEL
HDL: 51 mg/dL (ref >39)
LDL Cholesterol: 67 mg/dL (ref 0–99)
Triglycerides: 342 mg/dL — ABNORMAL HIGH (ref <150)

## 2012-08-18 MED ORDER — CYCLOBENZAPRINE HCL 5 MG PO TABS: 5.0000 mg | ORAL_TABLET | Freq: Every evening | ORAL | Status: DC | PRN

## 2012-08-18 MED ORDER — HYDROCHLOROTHIAZIDE 12.5 MG PO TABS: 12.5000 mg | ORAL_TABLET | Freq: Every day | ORAL | Status: DC

## 2012-08-18 NOTE — Progress Notes (Signed)
Patient here to establish care Has a history of headaches Both ears are blocked

## 2012-08-18 NOTE — Progress Notes (Signed)
Patient ID: April Mcmahon, female   DOB: 1960/02/03, 52 y.o.   MRN: [id number]  CC:  Chief Complaint  Patient presents with  . Establish Care   Interpreter used for communication  HPI Pt is presenting to establish care.  PT is reporting that she has been having chronic headaches and pain behind her eyes.  She has not had an eye exam and has been told by other providers that she needed to have an eye exam but has not been able to do so. She does not have a diagnosis in the past of hypertension but does say that she has been using herbal and natural substances to lower blood pressure at home.   No Known Allergies History reviewed. No pertinent past medical history. Current Outpatient Prescriptions on File Prior to Visit  Medication Sig Dispense Refill  . aspirin 325 MG tablet Take 650 mg by mouth every 6 (six) hours as needed. For headache       No current facility-administered medications on file prior to visit.   History reviewed. No pertinent family history. History   Social History  . Marital Status: Married    Spouse Name: N/A    Number of Children: N/A  . Years of Education: N/A   Occupational History  . Not on file.   Social History Main Topics  . Smoking status: Current Some Day Smoker  . Smokeless tobacco: Not on file  . Alcohol Use: No  . Drug Use: No  . Sexually Active:    Other Topics Concern  . Not on file   Social History Narrative  . No narrative on file    Review of Systems  Constitutional: Negative for fever, chills, diaphoresis, activity change, appetite change and fatigue.  HENT: Negative for ear pain, nosebleeds, congestion, facial swelling, rhinorrhea, neck pain, neck stiffness and ear discharge.   Eyes: positive for pain and visual disturbance. Negative for discharge, redness, itching.  Respiratory: Negative for cough, choking, chest tightness, shortness of breath, wheezing and stridor.   Cardiovascular: Negative for chest pain, palpitations and leg  swelling.  Gastrointestinal: Negative for abdominal distention.  Genitourinary: Negative for dysuria, urgency, frequency, hematuria, flank pain, decreased urine volume, difficulty urinating and dyspareunia.  Musculoskeletal: Negative for back pain, joint swelling, arthralgias and gait problem.  Neurological: Negative for dizziness, tremors, seizures, syncope, facial asymmetry, speech difficulty, weakness, light-headedness, numbness and positive for headaches.  Hematological: Negative for adenopathy. Does not bruise/bleed easily.  Psychiatric/Behavioral: Negative for hallucinations, behavioral problems, confusion, dysphoric mood, decreased concentration and agitation.    Objective:   Filed Vitals:   08/18/12 1637  BP: 148/84  Pulse: 91  Temp: 98.2 F (36.8 C)  Resp: 18    Physical Exam  Constitutional: Appears well-developed and well-nourished. No distress.  HENT: Normocephalic. External right and left ear normal. Oropharynx is clear and moist.  Eyes: Conjunctivae and EOM are normal. PERRLA, no scleral icterus.  Neck: Normal ROM. Neck supple. No JVD. No tracheal deviation. No thyromegaly.  CVS: RRR, S1/S2 +, no murmurs, no gallops, no carotid bruit.  Pulmonary: Effort and breath sounds normal, no stridor, rhonchi, wheezes, rales.  Abdominal: Soft. BS +,  no distension, tenderness, rebound or guarding.  Musculoskeletal: Normal range of motion. No edema and no tenderness.  Lymphadenopathy: No lymphadenopathy noted, cervical, inguinal. Neuro: Alert. Normal reflexes, muscle tone coordination. No cranial nerve deficit. Skin: Skin is warm and dry. No rash noted. Not diaphoretic. No erythema. No pallor.  Psychiatric: Normal mood and affect. Behavior, judgment,  thought content normal.   Lab Results  Component Value Date   HGB 14.3 10/30/2010   HCT 42.0 10/30/2010   Lab Results  Component Value Date   CREATININE 0.60 10/30/2010   BUN 9 10/30/2010   NA 142 10/30/2010   K 4.1 10/30/2010    CL 107 10/30/2010    No results found for this basename: HGBA1C   Lipid Panel  No results found for this basename: chol, trig, hdl, cholhdl, vldl, ldlcalc       Assessment and plan:   Patient Active Problem List   Diagnosis Date Noted  . Unspecified essential hypertension 08/18/2012  . Headache(784.0) 08/18/2012  . Low back pain 08/18/2012  . Neck pain 08/18/2012   HCTZ 12.5 mg po daily to treat hypertension.  Recommended to family and patient urgent eye appointment for eye exam to rule out glaucoma and visual deficits. Family says they will take patient to a local optometrist for evaluation asap.   Check labs today  The patient was given clear instructions to go to ER or return to medical center if symptoms don't improve, worsen or new problems develop.  The patient verbalized understanding.  The patient was told to call to get any lab results if not heard anything in the next week.    Follow up in 3 weeks  Rodney Langton, MD, CDE, FAAFP [facility] [place], [place] .  Results for orders placed in visit on 08/18/12  LIPID PANEL      Result Value Range   Cholesterol 186  0 - 200 mg/dL   Triglycerides 161 (*) <150 mg/dL   HDL 51  >09 mg/dL   Total CHOL/HDL Ratio 3.6     VLDL 68 (*) 0 - 40 mg/dL   LDL Cholesterol 67  0 - 99 mg/dL  CBC      Result Value Range   WBC 9.4  4.0 - 10.5 K/uL   RBC 4.57  3.87 - 5.11 MIL/uL   Hemoglobin 12.3  12.0 - 15.0 g/dL   HCT 60.4  54.0 - 98.1 %   MCV 79.6  78.0 - 100.0 fL   MCH 26.9  26.0 - 34.0 pg   MCHC 33.8  30.0 - 36.0 g/dL   RDW 19.1  47.8 - 29.5 %   Platelets 221  150 - 400 K/uL  COMPLETE METABOLIC PANEL WITH GFR      Result Value Range   Sodium 139  135 - 145 mEq/L   Potassium 4.1  3.5 - 5.3 mEq/L   Chloride 105  96 - 112 mEq/L   CO2 24  19 - 32 mEq/L   Glucose, Bld 132 (*) 70 - 99 mg/dL   BUN 12  6 - 23 mg/dL   Creat 6.21  3.08 - 6.57 mg/dL   Total Bilirubin 0.3  0.3 - 1.2 mg/dL   Alkaline  Phosphatase 97  39 - 117 U/L   AST 24  0 - 37 U/L   ALT 25  0 - 35 U/L   Total Protein 7.2  6.0 - 8.3 g/dL   Albumin 4.3  3.5 - 5.2 g/dL   Calcium 9.2  8.4 - 84.6 mg/dL   GFR, Est African American >89     GFR, Est Non African American >89

## 2012-08-18 NOTE — Patient Instructions (Addendum)
Please go to Eye doctor to have an eye exam as soon as possible   Headaches, Frequently Asked Questions MIGRAINE HEADACHES Q: What is migraine? What causes it? How can I treat it? A: Generally, migraine headaches begin as a dull ache. Then they develop into a constant, throbbing, and pulsating pain. You may experience pain at the temples. You may experience pain at the front or back of one or both sides of the head. The pain is usually accompanied by a combination of:  Nausea.  Vomiting.  Sensitivity to light and noise. Some people (about 15%) experience an aura (see below) before an attack. The cause of migraine is believed to be chemical reactions in the brain. Treatment for migraine may include over-the-counter or prescription medications. It may also include self-help techniques. These include relaxation training and biofeedback.  Q: What is an aura? A: About 15% of people with migraine get an "aura". This is a sign of neurological symptoms that occur before a migraine headache. You may see wavy or jagged lines, dots, or flashing lights. You might experience tunnel vision or blind spots in one or both eyes. The aura can include visual or auditory hallucinations (something imagined). It may include disruptions in smell (such as strange odors), taste or touch. Other symptoms include:  Numbness.  A "pins and needles" sensation.  Difficulty in recalling or speaking the correct word. These neurological events may last as long as 60 minutes. These symptoms will fade as the headache begins. Q: What is a trigger? A: Certain physical or environmental factors can lead to or "trigger" a migraine. These include:  Foods.  Hormonal changes.  Weather.  Stress. It is important to remember that triggers are different for everyone. To help prevent migraine attacks, you need to figure out which triggers affect you. Keep a headache diary. This is a good way to track triggers. The diary will help you  talk to your healthcare professional about your condition. Q: Does weather affect migraines? A: Bright sunshine, hot, humid conditions, and drastic changes in barometric pressure may lead to, or "trigger," a migraine attack in some people. But studies have shown that weather does not act as a trigger for everyone with migraines. Q: What is the link between migraine and hormones? A: Hormones start and regulate many of your body's functions. Hormones keep your body in balance within a constantly changing environment. The levels of hormones in your body are unbalanced at times. Examples are during menstruation, pregnancy, or menopause. That can lead to a migraine attack. In fact, about three quarters of all women with migraine report that their attacks are related to the menstrual cycle.  Q: Is there an increased risk of stroke for migraine sufferers? A: The likelihood of a migraine attack causing a stroke is very remote. That is not to say that migraine sufferers cannot have a stroke associated with their migraines. In persons under age 35, the most common associated factor for stroke is migraine headache. But over the course of a person's normal life span, the occurrence of migraine headache may actually be associated with a reduced risk of dying from cerebrovascular disease due to stroke.  Q: What are acute medications for migraine? A: Acute medications are used to treat the pain of the headache after it has started. Examples over-the-counter medications, NSAIDs, ergots, and triptans.  Q: What are the triptans? A: Triptans are the newest class of abortive medications. They are specifically targeted to treat migraine. Triptans are vasoconstrictors. They moderate  some chemical reactions in the brain. The triptans work on receptors in your brain. Triptans help to restore the balance of a neurotransmitter called serotonin. Fluctuations in levels of serotonin are thought to be a main cause of migraine.  Q: Are  over-the-counter medications for migraine effective? A: Over-the-counter, or "OTC," medications may be effective in relieving mild to moderate pain and associated symptoms of migraine. But you should see your caregiver before beginning any treatment regimen for migraine.  Q: What are preventive medications for migraine? A: Preventive medications for migraine are sometimes referred to as "prophylactic" treatments. They are used to reduce the frequency, severity, and length of migraine attacks. Examples of preventive medications include antiepileptic medications, antidepressants, beta-blockers, calcium channel blockers, and NSAIDs (nonsteroidal anti-inflammatory drugs). Q: Why are anticonvulsants used to treat migraine? A: During the past few years, there has been an increased interest in antiepileptic drugs for the prevention of migraine. They are sometimes referred to as "anticonvulsants". Both epilepsy and migraine may be caused by similar reactions in the brain.  Q: Why are antidepressants used to treat migraine? A: Antidepressants are typically used to treat people with depression. They may reduce migraine frequency by regulating chemical levels, such as serotonin, in the brain.  Q: What alternative therapies are used to treat migraine? A: The term "alternative therapies" is often used to describe treatments considered outside the scope of conventional Western medicine. Examples of alternative therapy include acupuncture, acupressure, and yoga. Another common alternative treatment is herbal therapy. Some herbs are believed to relieve headache pain. Always discuss alternative therapies with your caregiver before proceeding. Some herbal products contain arsenic and other toxins. TENSION HEADACHES Q: What is a tension-type headache? What causes it? How can I treat it? A: Tension-type headaches occur randomly. They are often the result of temporary stress, anxiety, fatigue, or anger. Symptoms include  soreness in your temples, a tightening band-like sensation around your head (a "vice-like" ache). Symptoms can also include a pulling feeling, pressure sensations, and contracting head and neck muscles. The headache begins in your forehead, temples, or the back of your head and neck. Treatment for tension-type headache may include over-the-counter or prescription medications. Treatment may also include self-help techniques such as relaxation training and biofeedback. CLUSTER HEADACHES Q: What is a cluster headache? What causes it? How can I treat it? A: Cluster headache gets its name because the attacks come in groups. The pain arrives with little, if any, warning. It is usually on one side of the head. A tearing or bloodshot eye and a runny nose on the same side of the headache may also accompany the pain. Cluster headaches are believed to be caused by chemical reactions in the brain. They have been described as the most severe and intense of any headache type. Treatment for cluster headache includes prescription medication and oxygen. SINUS HEADACHES Q: What is a sinus headache? What causes it? How can I treat it? A: When a cavity in the bones of the face and skull (a sinus) becomes inflamed, the inflammation will cause localized pain. This condition is usually the result of an allergic reaction, a tumor, or an infection. If your headache is caused by a sinus blockage, such as an infection, you will probably have a fever. An x-ray will confirm a sinus blockage. Your caregiver's treatment might include antibiotics for the infection, as well as antihistamines or decongestants.  REBOUND HEADACHES Q: What is a rebound headache? What causes it? How can I treat it? A: A pattern  of taking acute headache medications too often can lead to a condition known as "rebound headache." A pattern of taking too much headache medication includes taking it more than 2 days per week or in excessive amounts. That means more  than the label or a caregiver advises. With rebound headaches, your medications not only stop relieving pain, they actually begin to cause headaches. Doctors treat rebound headache by tapering the medication that is being overused. Sometimes your caregiver will gradually substitute a different type of treatment or medication. Stopping may be a challenge. Regularly overusing a medication increases the potential for serious side effects. Consult a caregiver if you regularly use headache medications more than 2 days per week or more than the label advises. ADDITIONAL QUESTIONS AND ANSWERS Q: What is biofeedback? A: Biofeedback is a self-help treatment. Biofeedback uses special equipment to monitor your body's involuntary physical responses. Biofeedback monitors:  Breathing.  Pulse.  Heart rate.  Temperature.  Muscle tension.  Brain activity. Biofeedback helps you refine and perfect your relaxation exercises. You learn to control the physical responses that are related to stress. Once the technique has been mastered, you do not need the equipment any more. Q: Are headaches hereditary? A: Four out of five (80%) of people that suffer report a family history of migraine. Scientists are not sure if this is genetic or a family predisposition. Despite the uncertainty, a child has a 50% chance of having migraine if one parent suffers. The child has a 75% chance if both parents suffer.  Q: Can children get headaches? A: By the time they reach high school, most young people have experienced some type of headache. Many safe and effective approaches or medications can prevent a headache from occurring or stop it after it has begun.  Q: What type of doctor should I see to diagnose and treat my headache? A: Start with your primary caregiver. Discuss his or her experience and approach to headaches. Discuss methods of classification, diagnosis, and treatment. Your caregiver may decide to recommend you to a  headache specialist, depending upon your symptoms or other physical conditions. Having diabetes, allergies, etc., may require a more comprehensive and inclusive approach to your headache. The National Headache Foundation will provide, upon request, a list of [facility] physician members in your state. Document Released: 04/10/2003 Document Revised: 04/12/2011 Document Reviewed: 09/18/2007 [facility] Patient Information 2014 [place], [place]. Hypertension As your heart beats, it forces blood through your arteries. This force is your blood pressure. If the pressure is too high, it is called hypertension (HTN) or high blood pressure. HTN is dangerous because you may have it and not know it. High blood pressure may mean that your heart has to work harder to pump blood. Your arteries may be narrow or stiff. The extra work puts you at risk for heart disease, stroke, and other problems.  Blood pressure consists of two numbers, a higher number over a lower, 110/72, for example. It is stated as "110 over 72." The ideal is below 120 for the top number (systolic) and under 80 for the bottom (diastolic). Write down your blood pressure today. You should pay close attention to your blood pressure if you have certain conditions such as:  Heart failure.  Prior heart attack.  Diabetes  Chronic kidney disease.  Prior stroke.  Multiple risk factors for heart disease. To see if you have HTN, your blood pressure should be measured while you are seated with your arm held at the level of the heart. It  should be measured at least twice. A one-time elevated blood pressure reading (especially in the Emergency Department) does not mean that you need treatment. There may be conditions in which the blood pressure is different between your right and left arms. It is important to see your caregiver soon for a recheck. Most people have essential hypertension which means that there is not a specific cause. This type of high blood pressure  may be lowered by changing lifestyle factors such as:  Stress.  Smoking.  Lack of exercise.  Excessive weight.  Drug/tobacco/alcohol use.  Eating less salt. Most people do not have symptoms from high blood pressure until it has caused damage to the body. Effective treatment can often prevent, delay or reduce that damage. TREATMENT  When a cause has been identified, treatment for high blood pressure is directed at the cause. There are a large number of medications to treat HTN. These fall into several categories, and your caregiver will help you select the medicines that are best for you. Medications may have side effects. You should review side effects with your caregiver. If your blood pressure stays high after you have made lifestyle changes or started on medicines,   Your medication(s) may need to be changed.  Other problems may need to be addressed.  Be certain you understand your prescriptions, and know how and when to take your medicine.  Be sure to follow up with your caregiver within the time frame advised (usually within two weeks) to have your blood pressure rechecked and to review your medications.  If you are taking more than one medicine to lower your blood pressure, make sure you know how and at what times they should be taken. Taking two medicines at the same time can result in blood pressure that is too low. SEEK IMMEDIATE MEDICAL CARE IF:  You develop a severe headache, blurred or changing vision, or confusion.  You have unusual weakness or numbness, or a faint feeling.  You have severe chest or abdominal pain, vomiting, or breathing problems. MAKE SURE YOU:   Understand these instructions.  Will watch your condition.  Will get help right away if you are not doing well or get worse. Document Released: 01/18/2005 Document Revised: 04/12/2011 Document Reviewed: 09/08/2007 [facility] Patient Information 2014 [place], [place].

## 2012-08-19 ENCOUNTER — Encounter: Payer: Self-pay | Admitting: Family Medicine

## 2012-08-19 NOTE — Progress Notes (Signed)
Quick Note:  Please inform patient that labs came back OK.   C. L. Johnson, MD, CDE, FAAFP [facility] [employer], [place]   ______ 

## 2012-08-22 ENCOUNTER — Telehealth: Payer: Self-pay | Admitting: *Deleted

## 2012-08-22 NOTE — Telephone Encounter (Signed)
08/22/12 Patient made aware that  Labs came back OK per Dr. Laural Benes. P.Nataliee Shurtz,RN BSN [facility]

## 2012-09-01 ENCOUNTER — Ambulatory Visit: Payer: No Typology Code available for payment source | Attending: Family Medicine | Admitting: Internal Medicine

## 2012-09-01 VITALS — BP 137/84 | HR 92 | Temp 98.1°F | Resp 16 | Wt 150.0 lb

## 2012-09-01 DIAGNOSIS — R51 Headache: Secondary | ICD-10-CM

## 2012-09-01 MED ORDER — BUTALBITAL-APAP-CAFFEINE 50-500-40 MG PO TABS: 1.0000 | ORAL_TABLET | ORAL | Status: DC | PRN

## 2012-09-01 MED ORDER — MECLIZINE HCL 25 MG PO TABS: 25.0000 mg | ORAL_TABLET | Freq: Three times a day (TID) | ORAL | Status: DC | PRN

## 2012-09-01 MED ORDER — FENOFIBRATE 145 MG PO TABS: 145.0000 mg | ORAL_TABLET | Freq: Every day | ORAL | Status: DC

## 2012-09-01 MED ORDER — HYDROCHLOROTHIAZIDE 12.5 MG PO TABS: 12.5000 mg | ORAL_TABLET | Freq: Every day | ORAL | Status: DC

## 2012-09-01 MED ORDER — CYCLOBENZAPRINE HCL 5 MG PO TABS: 5.0000 mg | ORAL_TABLET | Freq: Every evening | ORAL | Status: DC | PRN

## 2012-09-01 NOTE — Progress Notes (Unsigned)
Patient ID: April Mcmahon, female   DOB: [birth year], 52 y.o.   MRN: [id number]  CC:  HPI: 52 year old female is here for followup of her headache and her visual problems. The patient recently saw an ophthalmologist and her headaches are much better. The patient has been compliant with her antihypertensive medication. Hypertension stable continue hydrochlorothiazide   No Known Allergies History reviewed. No pertinent past medical history. Current Outpatient Prescriptions on File Prior to Visit  Medication Sig Dispense Refill  . aspirin 325 MG tablet Take 650 mg by mouth every 6 (six) hours as needed. For headache       No current facility-administered medications on file prior to visit.   History reviewed. No pertinent family history. History   Social History  . Marital Status: Married    Spouse Name: N/A    Number of Children: N/A  . Years of Education: N/A   Occupational History  . Not on file.   Social History Main Topics  . Smoking status: Current Some Day Smoker  . Smokeless tobacco: Not on file  . Alcohol Use: No  . Drug Use: No  . Sexually Active:    Other Topics Concern  . Not on file   Social History Narrative  . No narrative on file    Review of Systems  Constitutional: Negative for fever, chills, diaphoresis, activity change, appetite change and fatigue.  HENT: Negative for ear pain, nosebleeds, congestion, facial swelling, rhinorrhea, neck pain, neck stiffness and ear discharge.   Eyes: Negative for pain, discharge, redness, itching and visual disturbance.  Respiratory: Negative for cough, choking, chest tightness, shortness of breath, wheezing and stridor.   Cardiovascular: Negative for chest pain, palpitations and leg swelling.  Gastrointestinal: Negative for abdominal distention.  Genitourinary: Negative for dysuria, urgency, frequency, hematuria, flank pain, decreased urine volume, difficulty urinating and dyspareunia.  Musculoskeletal: Negative for back  pain, joint swelling, arthralgias and gait problem.  Neurological: Negative for dizziness, tremors, seizures, syncope, facial asymmetry, speech difficulty, weakness, light-headedness, numbness and headaches.  Hematological: Negative for adenopathy. Does not bruise/bleed easily.  Psychiatric/Behavioral: Negative for hallucinations, behavioral problems, confusion, dysphoric mood, decreased concentration and agitation.    Objective:   Filed Vitals:   09/01/12 1400  BP: 137/84  Pulse: 92  Temp: 98.1 F (36.7 C)  Resp: 16    Physical Exam  Constitutional: Appears well-developed and well-nourished. No distress.  HENT: Normocephalic. External right and left ear normal. Oropharynx is clear and moist.  Eyes: Conjunctivae and EOM are normal. PERRLA, no scleral icterus.  Neck: Normal ROM. Neck supple. No JVD. No tracheal deviation. No thyromegaly.  CVS: RRR, S1/S2 +, no murmurs, no gallops, no carotid bruit.  Pulmonary: Effort and breath sounds normal, no stridor, rhonchi, wheezes, rales.  Abdominal: Soft. BS +,  no distension, tenderness, rebound or guarding.  Musculoskeletal: Normal range of motion. No edema and no tenderness.  Lymphadenopathy: No lymphadenopathy noted, cervical, inguinal. Neuro: Alert. Normal reflexes, muscle tone coordination. No cranial nerve deficit. Skin: Skin is warm and dry. No rash noted. Not diaphoretic. No erythema. No pallor.  Psychiatric: Normal mood and affect. Behavior, judgment, thought content normal.   Lab Results  Component Value Date   WBC 9.4 08/18/2012   HGB 12.3 08/18/2012   HCT 36.4 08/18/2012   MCV 79.6 08/18/2012   PLT 221 08/18/2012   Lab Results  Component Value Date   CREATININE 0.74 08/18/2012   BUN 12 08/18/2012   NA 139 08/18/2012   K 4.1 08/18/2012  CL 105 08/18/2012   CO2 24 08/18/2012    No results found for this basename: HGBA1C   Lipid Panel     Component Value Date/Time   CHOL 186 08/18/2012 1715   TRIG 342* 08/18/2012 1715    HDL 51 08/18/2012 1715   CHOLHDL 3.6 08/18/2012 1715   VLDL 68* 08/18/2012 1715   LDLCALC 67 08/18/2012 1715       Assessment and plan:   Patient Active Problem List   Diagnosis Date Noted  . Unspecified essential hypertension 08/18/2012  . Headache(784.0) 08/18/2012  . Low back pain 08/18/2012  . Neck pain 08/18/2012       Hypertension stable continue hydrochlorothiazide  Headaches improved with change of glasses  Hypertriglyceridemia start the patient on [facility] Either repeat lipid panel in 6 weeks

## 2012-09-01 NOTE — Progress Notes (Unsigned)
Patient here for follow up- headaches States since getting her glasses headaches have been less frequent

## 2012-12-11 ENCOUNTER — Ambulatory Visit: Payer: Self-pay | Attending: Internal Medicine

## 2013-02-19 ENCOUNTER — Ambulatory Visit: Payer: Self-pay

## 2013-02-19 ENCOUNTER — Ambulatory Visit: Payer: Self-pay | Admitting: Internal Medicine

## 2013-04-06 ENCOUNTER — Ambulatory Visit: Payer: Self-pay | Admitting: Internal Medicine

## 2013-04-19 ENCOUNTER — Ambulatory Visit: Payer: No Typology Code available for payment source | Attending: Internal Medicine | Admitting: Internal Medicine

## 2013-04-19 ENCOUNTER — Encounter: Payer: Self-pay | Admitting: Internal Medicine

## 2013-04-19 VITALS — BP 146/87 | HR 74 | Temp 97.6°F | Resp 16 | Ht 60.0 in | Wt 152.0 lb

## 2013-04-19 DIAGNOSIS — F17200 Nicotine dependence, unspecified, uncomplicated: Secondary | ICD-10-CM | POA: Insufficient documentation

## 2013-04-19 DIAGNOSIS — Z139 Encounter for screening, unspecified: Secondary | ICD-10-CM

## 2013-04-19 DIAGNOSIS — M545 Low back pain, unspecified: Secondary | ICD-10-CM | POA: Insufficient documentation

## 2013-04-19 DIAGNOSIS — K029 Dental caries, unspecified: Secondary | ICD-10-CM

## 2013-04-19 DIAGNOSIS — I10 Essential (primary) hypertension: Secondary | ICD-10-CM | POA: Insufficient documentation

## 2013-04-19 DIAGNOSIS — Z7982 Long term (current) use of aspirin: Secondary | ICD-10-CM | POA: Insufficient documentation

## 2013-04-19 DIAGNOSIS — Z79899 Other long term (current) drug therapy: Secondary | ICD-10-CM | POA: Insufficient documentation

## 2013-04-19 DIAGNOSIS — E781 Pure hyperglyceridemia: Secondary | ICD-10-CM | POA: Insufficient documentation

## 2013-04-19 DIAGNOSIS — R51 Headache: Secondary | ICD-10-CM | POA: Insufficient documentation

## 2013-04-19 LAB — COMPLETE METABOLIC PANEL WITH GFR
ALBUMIN: 4.7 g/dL (ref 3.5–5.2)
ALK PHOS: 102 U/L (ref 39–117)
ALT: 29 U/L (ref 0–35)
AST: 27 U/L (ref 0–37)
BILIRUBIN TOTAL: 0.3 mg/dL (ref 0.2–1.2)
BUN: 8 mg/dL (ref 6–23)
CO2: 26 mEq/L (ref 19–32)
Calcium: 9.7 mg/dL (ref 8.4–10.5)
Chloride: 107 mEq/L (ref 96–112)
Creat: 0.67 mg/dL (ref 0.50–1.10)
GLUCOSE: 113 mg/dL — AB (ref 70–99)
POTASSIUM: 4.5 meq/L (ref 3.5–5.3)
Sodium: 141 mEq/L (ref 135–145)
Total Protein: 7.8 g/dL (ref 6.0–8.3)

## 2013-04-19 LAB — CBC WITH DIFFERENTIAL/PLATELET
BASOS PCT: 0 % (ref 0–1)
Basophils Absolute: 0 10*3/uL (ref 0.0–0.1)
EOS ABS: 0.2 10*3/uL (ref 0.0–0.7)
Eosinophils Relative: 3 % (ref 0–5)
HEMATOCRIT: 40.3 % (ref 36.0–46.0)
HEMOGLOBIN: 13 g/dL (ref 12.0–15.0)
Lymphocytes Relative: 38 % (ref 12–46)
Lymphs Abs: 2.9 10*3/uL (ref 0.7–4.0)
MCH: 25.7 pg — AB (ref 26.0–34.0)
MCHC: 32.3 g/dL (ref 30.0–36.0)
MCV: 79.8 fL (ref 78.0–100.0)
MONO ABS: 0.3 10*3/uL (ref 0.1–1.0)
MONOS PCT: 4 % (ref 3–12)
Neutro Abs: 4.1 10*3/uL (ref 1.7–7.7)
Neutrophils Relative %: 55 % (ref 43–77)
Platelets: 191 10*3/uL (ref 150–400)
RBC: 5.05 MIL/uL (ref 3.87–5.11)
RDW: 14.7 % (ref 11.5–15.5)
WBC: 7.5 10*3/uL (ref 4.0–10.5)

## 2013-04-19 LAB — LIPID PANEL
Cholesterol: 178 mg/dL (ref 0–200)
HDL: 47 mg/dL (ref >39)
LDL Cholesterol: 75 mg/dL (ref 0–99)
Total CHOL/HDL Ratio: 3.8 Ratio
Triglycerides: 282 mg/dL — ABNORMAL HIGH (ref <150)
VLDL: 56 mg/dL — ABNORMAL HIGH (ref 0–40)

## 2013-04-19 LAB — TSH: TSH: 1.492 u[IU]/mL (ref 0.350–4.500)

## 2013-04-19 MED ORDER — HYDROCHLOROTHIAZIDE 12.5 MG PO TABS: 12.5000 mg | ORAL_TABLET | Freq: Every day | ORAL | Status: DC

## 2013-04-19 MED ORDER — FENOFIBRATE 145 MG PO TABS: 145.0000 mg | ORAL_TABLET | Freq: Every day | ORAL | Status: DC

## 2013-04-19 MED ORDER — BUTALBITAL-APAP-CAFFEINE 50-500-40 MG PO TABS: 1.0000 | ORAL_TABLET | ORAL | Status: DC | PRN

## 2013-04-19 NOTE — Progress Notes (Signed)
Pt is here following up on her chronic back pain. Pt reports having frequent headaches. Pt has an interpretor.

## 2013-04-19 NOTE — Progress Notes (Signed)
MRN: [id number] Name: April Mcmahon  Sex: female Age: 53 y.o. DOB: January 01, 1961  Allergies: Review of patient's allergies indicates no known allergies.  Chief Complaint  Patient presents with  . Follow-up    HPI: Patient is 53 y.o. female who has history of hypertension, hypertriglyceridemia, chronic headache, comes today for followup her, has not been seen in the office for several months, as per patient she stopped taking the medications, now she has orange card and will be able to take the medications, she's requesting on refill, currently denies any headache dizziness chest and shortness of breath. Patient also reported to have lot of dental cavities and is requesting to see a dentist.  History reviewed. No pertinent past medical history.  History reviewed. No pertinent past surgical history.    Medication List       This list is accurate as of: 04/19/13  9:30 AM.  Always use your most recent med list.               aspirin 325 MG tablet  Take 650 mg by mouth every 6 (six) hours as needed. For headache     butalbital-acetaminophen-caffeine 50-500-40 MG per tablet  Commonly known as:  ESGIC PLUS  Take 1 tablet by mouth every 4 (four) hours as needed for pain.     cyclobenzaprine 5 MG tablet  Commonly known as:  FLEXERIL  Take 1 tablet (5 mg total) by mouth at bedtime as needed for muscle spasms.     fenofibrate 145 MG tablet  Commonly known as:  TRICOR  Take 1 tablet (145 mg total) by mouth daily.     hydrochlorothiazide 12.5 MG tablet  Commonly known as:  HYDRODIURIL  Take 1 tablet (12.5 mg total) by mouth daily.     meclizine 25 MG tablet  Commonly known as:  ANTIVERT  Take 1 tablet (25 mg total) by mouth 3 (three) times daily as needed.        Meds ordered this encounter  Medications  . fenofibrate (TRICOR) 145 MG tablet    Sig: Take 1 tablet (145 mg total) by mouth daily.    Dispense:  30 tablet    Refill:  2  . hydrochlorothiazide (HYDRODIURIL) 12.5  MG tablet    Sig: Take 1 tablet (12.5 mg total) by mouth daily.    Dispense:  30 tablet    Refill:  3  . butalbital-acetaminophen-caffeine (ESGIC PLUS) 50-500-40 MG per tablet    Sig: Take 1 tablet by mouth every 4 (four) hours as needed for pain.    Dispense:  30 tablet    Refill:  0     There is no immunization history on file for this patient.  History reviewed. No pertinent family history.  History  Substance Use Topics  . Smoking status: Current Some Day Smoker  . Smokeless tobacco: Not on file  . Alcohol Use: No    Review of Systems   As noted in HPI  Filed Vitals:   04/19/13 0910  BP: 146/87  Pulse: 74  Temp: 97.6 F (36.4 C)  Resp: 16    Physical Exam  Physical Exam  Constitutional: No distress.  Eyes: EOM are normal. Pupils are equal, round, and reactive to light.  Cardiovascular: Normal rate and regular rhythm.   Pulmonary/Chest: Breath sounds normal. No respiratory distress. She has no wheezes. She has no rales.  Musculoskeletal: She exhibits no edema.  Neurological:  Equal strength all extremities     CBC  Component Value Date/Time   WBC 9.4 08/18/2012 1715   RBC 4.57 08/18/2012 1715   HGB 12.3 08/18/2012 1715   HCT 36.4 08/18/2012 1715   PLT 221 08/18/2012 1715   MCV 79.6 08/18/2012 1715    CMP     Component Value Date/Time   NA 139 08/18/2012 1715   K 4.1 08/18/2012 1715   CL 105 08/18/2012 1715   CO2 24 08/18/2012 1715   GLUCOSE 132* 08/18/2012 1715   BUN 12 08/18/2012 1715   CREATININE 0.74 08/18/2012 1715   CREATININE 0.60 10/30/2010 1141   CALCIUM 9.2 08/18/2012 1715   PROT 7.2 08/18/2012 1715   ALBUMIN 4.3 08/18/2012 1715   AST 24 08/18/2012 1715   ALT 25 08/18/2012 1715   ALKPHOS 97 08/18/2012 1715   BILITOT 0.3 08/18/2012 1715    Lab Results  Component Value Date/Time   CHOL 186 08/18/2012  5:15 PM    No components found with this basename: hga1c    Lab Results  Component Value Date/Time   AST 24 08/18/2012  5:15 PM     Assessment and Plan  Unspecified essential hypertension - Plan: DASH diet resume on hydrochlorothiazide (HYDRODIURIL) 12.5 MG tablet  Headache(784.0) - Plan: butalbital-acetaminophen-caffeine (ESGIC PLUS) 50-500-40 MG per tablet  Low back pain Tylenol/ibuprofen when necessary  Screening - Plan: CBC with Differential, COMPLETE METABOLIC PANEL WITH GFR, TSH, Lipid panel, Vit D  25 hydroxy (rtn osteoporosis monitoring)  Hypertriglyceridemia - Plan: Restart fenofibrate (TRICOR) 145 MG tablet  Dental cavities - Plan: Ambulatory referral to Dentistry   Return in about 3 months (around 07/20/2013) for hypertension.  Lorayne Marek, MD

## 2013-04-20 ENCOUNTER — Telehealth: Payer: Self-pay

## 2013-04-20 LAB — VITAMIN D 25 HYDROXY (VIT D DEFICIENCY, FRACTURES): VIT D 25 HYDROXY: 23 ng/mL — AB (ref 30–89)

## 2013-04-20 MED ORDER — VITAMIN D (ERGOCALCIFEROL) 1.25 MG (50000 UNIT) PO CAPS: 50000.0000 [IU] | ORAL_CAPSULE | ORAL | Status: DC

## 2013-04-20 NOTE — Telephone Encounter (Signed)
Interpreter line used Patient is aware of her lab results 

## 2013-04-20 NOTE — Telephone Encounter (Signed)
Message copied by Dorothe Pea on Fri Apr 20, 2013  2:40 PM ------      Message from: Lorayne Marek      Created: Fri Apr 20, 2013  1:56 PM       Blood work reviewed, noticed low vitamin D, call patient advise to start ergocalciferol 50,000 units once a week for the duration of  12 weeks.       noticed impaired fasting glucose, call and advise patient for low carbohydrate diet.       ------

## 2013-05-15 ENCOUNTER — Encounter: Payer: Self-pay | Admitting: Internal Medicine

## 2013-05-15 ENCOUNTER — Ambulatory Visit: Payer: No Typology Code available for payment source | Attending: Internal Medicine | Admitting: Internal Medicine

## 2013-05-15 VITALS — BP 132/85 | HR 72 | Temp 98.5°F | Resp 16

## 2013-05-15 DIAGNOSIS — K0889 Other specified disorders of teeth and supporting structures: Secondary | ICD-10-CM

## 2013-05-15 DIAGNOSIS — G43909 Migraine, unspecified, not intractable, without status migrainosus: Secondary | ICD-10-CM

## 2013-05-15 MED ORDER — SUMATRIPTAN SUCCINATE 50 MG PO TABS: 50.0000 mg | ORAL_TABLET | ORAL | Status: DC | PRN

## 2013-05-15 MED ORDER — TOPIRAMATE 25 MG PO TABS: 25.0000 mg | ORAL_TABLET | Freq: Two times a day (BID) | ORAL | Status: DC

## 2013-05-15 NOTE — Progress Notes (Signed)
MRN: [id number] Name: April Mcmahon  Sex: female Age: 53 y.o. DOB: 04-18-1960  Allergies: Review of patient's allergies indicates no known allergies.  Chief Complaint  Patient presents with  . Headache    HPI: Patient is 53 y.o. female who reported to have migraine headache for several years, she was prescribed Esgic plus on the last visit as per patient she has used the medication still has persistent pulsating pain and bright light bothers her, denies any chest pain or shortness of breath history of hypertension blood pressure is well controlled, she also reported to have lot of dental cavities and loose teeth her would like a referral to see a dentist.  History reviewed. No pertinent past medical history.  History reviewed. No pertinent past surgical history.    Medication List       This list is accurate as of: 05/15/13  1:06 PM.  Always use your most recent med list.               aspirin 325 MG tablet  Take 650 mg by mouth every 6 (six) hours as needed. For headache     butalbital-acetaminophen-caffeine 50-500-40 MG per tablet  Commonly known as:  ESGIC PLUS  Take 1 tablet by mouth every 4 (four) hours as needed for pain.     cyclobenzaprine 5 MG tablet  Commonly known as:  FLEXERIL  Take 1 tablet (5 mg total) by mouth at bedtime as needed for muscle spasms.     fenofibrate 145 MG tablet  Commonly known as:  TRICOR  Take 1 tablet (145 mg total) by mouth daily.     hydrochlorothiazide 12.5 MG tablet  Commonly known as:  HYDRODIURIL  Take 1 tablet (12.5 mg total) by mouth daily.     meclizine 25 MG tablet  Commonly known as:  ANTIVERT  Take 1 tablet (25 mg total) by mouth 3 (three) times daily as needed.     SUMAtriptan 50 MG tablet  Commonly known as:  IMITREX  Take 1 tablet (50 mg total) by mouth every 2 (two) hours as needed for migraine or headache. May repeat in 2 hours if headache persists or recurs.     topiramate 25 MG tablet  Commonly known as:   TOPAMAX  Take 1 tablet (25 mg total) by mouth 2 (two) times daily.     Vitamin D (Ergocalciferol) 50000 UNITS Caps capsule  Commonly known as:  DRISDOL  Take 1 capsule (50,000 Units total) by mouth every 7 (seven) days.        Meds ordered this encounter  Medications  . topiramate (TOPAMAX) 25 MG tablet    Sig: Take 1 tablet (25 mg total) by mouth 2 (two) times daily.    Dispense:  60 tablet    Refill:  3  . SUMAtriptan (IMITREX) 50 MG tablet    Sig: Take 1 tablet (50 mg total) by mouth every 2 (two) hours as needed for migraine or headache. May repeat in 2 hours if headache persists or recurs.    Dispense:  10 tablet    Refill:  0     There is no immunization history on file for this patient.  History reviewed. No pertinent family history.  History  Substance Use Topics  . Smoking status: Current Some Day Smoker  . Smokeless tobacco: Not on file  . Alcohol Use: No    Review of Systems   As noted in HPI  Filed Vitals:   05/15/13 1233  BP: 132/85  Pulse: 72  Temp: 98.5 F (36.9 C)  Resp: 16    Physical Exam  Physical Exam  HENT:  Dental cavities   Eyes: EOM are normal. Pupils are equal, round, and reactive to light.  Cardiovascular: Normal rate and regular rhythm.   Pulmonary/Chest: Breath sounds normal. No respiratory distress. She has no wheezes. She has no rales.  Musculoskeletal: She exhibits no edema.  Neurological: She has normal reflexes.    CBC    Component Value Date/Time   WBC 7.5 04/19/2013 0933   RBC 5.05 04/19/2013 0933   HGB 13.0 04/19/2013 0933   HCT 40.3 04/19/2013 0933   PLT 191 04/19/2013 0933   MCV 79.8 04/19/2013 0933   LYMPHSABS 2.9 04/19/2013 0933   MONOABS 0.3 04/19/2013 0933   EOSABS 0.2 04/19/2013 0933   BASOSABS 0.0 04/19/2013 0933    CMP     Component Value Date/Time   NA 141 04/19/2013 0933   K 4.5 04/19/2013 0933   CL 107 04/19/2013 0933   CO2 26 04/19/2013 0933   GLUCOSE 113* 04/19/2013 0933   BUN 8 04/19/2013 0933    CREATININE 0.67 04/19/2013 0933   CREATININE 0.60 10/30/2010 1141   CALCIUM 9.7 04/19/2013 0933   PROT 7.8 04/19/2013 0933   ALBUMIN 4.7 04/19/2013 0933   AST 27 04/19/2013 0933   ALT 29 04/19/2013 0933   ALKPHOS 102 04/19/2013 0933   BILITOT 0.3 04/19/2013 0933   GFRNONAA >89 04/19/2013 0933   GFRAA >89 04/19/2013 0933    Lab Results  Component Value Date/Time   CHOL 178 04/19/2013  9:33 AM    No components found with this basename: hga1c    Lab Results  Component Value Date/Time   AST 27 04/19/2013  9:33 AM    Assessment and Plan  Migraine - Plan: Have started patient on topiramate (TOPAMAX) 25 MG tablet for prophylaxis, SUMAtriptan (IMITREX) 50 MG tablet when necessary.  Tooth loose - Plan: Ambulatory referral to Dentistry  Return in about 3 months (around 08/14/2013) for hypertension, migraine.  Lorayne Marek, MD

## 2013-05-15 NOTE — Progress Notes (Signed)
Patient here with interpreter Complains of headache since yesterday No relief with the migraine medication that she is prescribed The light bothers her eyes

## 2013-08-09 ENCOUNTER — Emergency Department (HOSPITAL_COMMUNITY)
Admission: EM | Admit: 2013-08-09 | Discharge: 2013-08-09 | Disposition: A | Discharge status: Home / Self Care | Payer: No Typology Code available for payment source | Source: Home / Self Care | Attending: Family Medicine | Admitting: Family Medicine

## 2013-08-09 ENCOUNTER — Encounter (HOSPITAL_COMMUNITY): Payer: Self-pay | Admitting: Emergency Medicine

## 2013-08-09 DIAGNOSIS — IMO0001 Reserved for inherently not codable concepts without codable children: Secondary | ICD-10-CM

## 2013-08-09 DIAGNOSIS — G44209 Tension-type headache, unspecified, not intractable: Secondary | ICD-10-CM

## 2013-08-09 DIAGNOSIS — E1165 Type 2 diabetes mellitus with hyperglycemia: Secondary | ICD-10-CM

## 2013-08-09 DIAGNOSIS — IMO0002 Reserved for concepts with insufficient information to code with codable children: Secondary | ICD-10-CM

## 2013-08-09 LAB — GLUCOSE, CAPILLARY: GLUCOSE-CAPILLARY: 113 mg/dL — AB (ref 70–99)

## 2013-08-09 NOTE — ED Provider Notes (Signed)
CSN: [id number]     Arrival date & time 08/09/13  1722 History   First MD Initiated Contact with Patient 08/09/13 1846     Chief Complaint  Patient presents with  . Hyperglycemia   (Consider location/radiation/quality/duration/timing/severity/associated sxs/prior Treatment) HPI Comments: Patient was seen by congregational nurse and had blood sugar of 276, asymptomatic. The patient was told that she needs to come to urgent care immediately, and was specifically told that this would be better than her primary care. She denies any new complaints at this time whatsoever, although she has lots of chronic somatic complaints that are all being adequately treated by primary care. She denies nausea, vomiting, blurry vision, headache, or any other symptoms.   History reviewed. No pertinent past medical history. History reviewed. No pertinent past surgical history. No family history on file. History  Substance Use Topics  . Smoking status: Current Some Day Smoker  . Smokeless tobacco: Not on file  . Alcohol Use: No   OB History   Grav Para Term Preterm Abortions TAB SAB Ect Mult Living                 Review of Systems  Constitutional: Negative for fever and chills.  Eyes: Negative for visual disturbance.  Respiratory: Negative for cough and shortness of breath.   Cardiovascular: Negative for chest pain, palpitations and leg swelling.  Gastrointestinal: Negative for nausea, vomiting and abdominal pain.  Endocrine: Negative for polydipsia and polyuria.  Genitourinary: Negative for dysuria, urgency and frequency.  Musculoskeletal: Positive for arthralgias (chronic) and myalgias (Chronic).  Skin: Negative for rash.  Neurological: Negative for dizziness, weakness and light-headedness.  All other systems reviewed and are negative.   Allergies  Review of patient's allergies indicates no known allergies.  Home Medications   Prior to Admission medications   Medication Sig Start Date End Date  Taking? Authorizing Provider  aspirin 325 MG tablet Take 650 mg by mouth every 6 (six) hours as needed. For headache    Historical Provider, MD  butalbital-acetaminophen-caffeine (ESGIC PLUS) 50-500-40 MG per tablet Take 1 tablet by mouth every 4 (four) hours as needed for pain. 04/19/13   Lorayne Marek, MD  cyclobenzaprine (FLEXERIL) 5 MG tablet Take 1 tablet (5 mg total) by mouth at bedtime as needed for muscle spasms. 09/01/12   Reyne Dumas, MD  fenofibrate (TRICOR) 145 MG tablet Take 1 tablet (145 mg total) by mouth daily. 04/19/13   Lorayne Marek, MD  hydrochlorothiazide (HYDRODIURIL) 12.5 MG tablet Take 1 tablet (12.5 mg total) by mouth daily. 04/19/13   Lorayne Marek, MD  meclizine (ANTIVERT) 25 MG tablet Take 1 tablet (25 mg total) by mouth 3 (three) times daily as needed. 09/01/12   Reyne Dumas, MD  SUMAtriptan (IMITREX) 50 MG tablet Take 1 tablet (50 mg total) by mouth every 2 (two) hours as needed for migraine or headache. May repeat in 2 hours if headache persists or recurs. 05/15/13   Lorayne Marek, MD  topiramate (TOPAMAX) 25 MG tablet Take 1 tablet (25 mg total) by mouth 2 (two) times daily. 05/15/13   Lorayne Marek, MD  Vitamin D, Ergocalciferol, (DRISDOL) 50000 UNITS CAPS capsule Take 1 capsule (50,000 Units total) by mouth every 7 (seven) days. 04/20/13   Lorayne Marek, MD   BP 144/75  Pulse 66  Temp(Src) 98.7 F (37.1 C) (Oral)  Resp 18  SpO2 100% Physical Exam  Nursing note and vitals reviewed. Constitutional: She is oriented to person, place, and time. Vital signs are normal.  She appears well-developed and well-nourished. No distress.  HENT:  Head: Normocephalic and atraumatic.  Cardiovascular: Normal rate, regular rhythm and normal heart sounds.   Pulmonary/Chest: Effort normal and breath sounds normal. No respiratory distress.  Abdominal: Soft. She exhibits no mass. There is no tenderness. There is no rebound and no guarding.  Neurological: She is alert and oriented to person,  place, and time. She has normal strength. Coordination normal.  Skin: Skin is warm and dry. No rash noted. She is not diaphoretic.  Psychiatric: She has a normal mood and affect. Judgment normal.    ED Course  Procedures (including critical care time) Labs Review Labs Reviewed  GLUCOSE, CAPILLARY - Abnormal; Notable for the following:    Glucose-Capillary 113 (*)    All other components within normal limits    Imaging Review No results found.   MDM   1. Diabetes type 2, uncontrolled   2. Tension headache    Physical exam is normal, asymptomatic, and blood sugar is now normal. Followup with primary care     Liam Graham, PA-C 08/10/13 1443

## 2013-08-09 NOTE — Discharge Instructions (Signed)
Diabetes and Standards of Medical Care  Diabetes is complicated. You may find that your diabetes team includes a dietitian, nurse, diabetes educator, eye doctor, and more. To help everyone know what is going on and to help you get the care you deserve, the following schedule of care was developed to help keep you on track. Below are the tests, exams, vaccines, medicines, education, and plans you will need. HbA1c test This test shows how well you have controlled your glucose over the past 2-3 months. It is used to see if your diabetes management plan needs to be adjusted.   It is performed at least 2 times a year if you are meeting treatment goals.  It is performed 4 times a year if therapy has changed or if you are not meeting treatment goals. Blood pressure test  This test is performed at every routine medical visit. The goal is less than 140/90 mmHg for most people, but 130/80 mmHg in some cases. Ask your health care provider about your goal. Dental exam  Follow up with the dentist regularly. Eye exam  If you are diagnosed with type 1 diabetes as a child, get an exam upon reaching the age of 10 years or older and have had diabetes for 3-5 years. Yearly eye exams are recommended after that initial eye exam.  If you are diagnosed with type 1 diabetes as an adult, get an exam within 5 years of diagnosis and then yearly.  If you are diagnosed with type 2 diabetes, get an exam as soon as possible after the diagnosis and then yearly. Foot care exam  Visual foot exams are performed at every routine medical visit. The exams check for cuts, injuries, or other problems with the feet.  A comprehensive foot exam should be done yearly. This includes visual inspection as well as assessing foot pulses and testing for loss of sensation.  Check your feet nightly for cuts, injuries, or other problems with your feet. Tell your health care provider if anything is not healing. Kidney function test (urine  microalbumin)  This test is performed once a year.  Type 1 diabetes: The first test is performed 5 years after diagnosis.  Type 2 diabetes: The first test is performed at the time of diagnosis.  A serum creatinine and estimated glomerular filtration rate (eGFR) test is done once a year to assess the level of chronic kidney disease (CKD), if present. Lipid profile (cholesterol, HDL, LDL, triglycerides)  Performed every 5 years for most people.  The goal for LDL is less than 100 mg/dL. If you are at high risk, the goal is less than 70 mg/dL.  The goal for HDL is 40 mg/dL-50 mg/dL for men and 50 mg/dL-60 mg/dL for women. An HDL cholesterol of 60 mg/dL or higher gives some protection against heart disease.  The goal for triglycerides is less than 150 mg/dL. Influenza vaccine, pneumococcal vaccine, and hepatitis B vaccine  The influenza vaccine is recommended yearly.  The pneumococcal vaccine is generally given once in a lifetime. However, there are some instances when another vaccination is recommended. Check with your health care provider.  The hepatitis B vaccine is also recommended for adults with diabetes. Diabetes self-management education  Education is recommended at diagnosis and ongoing as needed. Treatment plan  Your treatment plan is reviewed at every medical visit. Document Released: 11/15/2008 Document Revised: 09/20/2012 Document Reviewed: 06/20/2012 ExitCare Patient Information 2015 [employer]. This information is not intended to replace advice given to you by your   health care provider. Make sure you discuss any questions you have with your health care provider.  

## 2013-08-09 NOTE — ED Notes (Signed)
Patient was seen earlier today by the congregational nurse Her fasting blood sugar this am was 276 Patient took medications as scheduled

## 2013-08-12 NOTE — ED Provider Notes (Signed)
Medical screening examination/treatment/procedure(s) were performed by resident physician or non-physician practitioner and as supervising physician I was immediately available for consultation/collaboration.   Pauline Good MD.   Billy Fischer, MD 08/12/13 1314

## 2013-08-29 ENCOUNTER — Ambulatory Visit: Payer: Self-pay | Admitting: Internal Medicine

## 2013-09-01 DIAGNOSIS — I10 Essential (primary) hypertension: Secondary | ICD-10-CM

## 2013-09-01 HISTORY — DX: Essential (primary) hypertension: I10

## 2013-09-13 ENCOUNTER — Encounter: Payer: Self-pay | Admitting: *Deleted

## 2013-09-13 ENCOUNTER — Ambulatory Visit: Payer: Self-pay | Admitting: Internal Medicine

## 2013-09-13 NOTE — Progress Notes (Signed)
Patient presents to walk-in clinic with c/o headache, back pain, and arm pain for two months every since she got her teeth pulled. Patient is already on Imitrex and Toprimate for headaches. VS are stable. Patient given an appointment for 09/24/2013. Patient verbalized understanding via telephone interpreter. Vivia Birmingham, RN

## 2013-09-24 ENCOUNTER — Encounter: Payer: Self-pay | Admitting: Internal Medicine

## 2013-09-24 ENCOUNTER — Ambulatory Visit: Payer: No Typology Code available for payment source | Attending: Internal Medicine | Admitting: Internal Medicine

## 2013-09-24 VITALS — BP 144/78 | HR 94 | Temp 98.2°F | Resp 15 | Wt 147.2 lb

## 2013-09-24 DIAGNOSIS — F17200 Nicotine dependence, unspecified, uncomplicated: Secondary | ICD-10-CM | POA: Insufficient documentation

## 2013-09-24 DIAGNOSIS — G43709 Chronic migraine without aura, not intractable, without status migrainosus: Secondary | ICD-10-CM

## 2013-09-24 DIAGNOSIS — R7301 Impaired fasting glucose: Secondary | ICD-10-CM | POA: Insufficient documentation

## 2013-09-24 DIAGNOSIS — E781 Pure hyperglyceridemia: Secondary | ICD-10-CM | POA: Insufficient documentation

## 2013-09-24 DIAGNOSIS — I10 Essential (primary) hypertension: Secondary | ICD-10-CM

## 2013-09-24 DIAGNOSIS — M25529 Pain in unspecified elbow: Secondary | ICD-10-CM

## 2013-09-24 DIAGNOSIS — M25521 Pain in right elbow: Secondary | ICD-10-CM

## 2013-09-24 DIAGNOSIS — Z7982 Long term (current) use of aspirin: Secondary | ICD-10-CM | POA: Insufficient documentation

## 2013-09-24 DIAGNOSIS — E559 Vitamin D deficiency, unspecified: Secondary | ICD-10-CM | POA: Insufficient documentation

## 2013-09-24 DIAGNOSIS — G43909 Migraine, unspecified, not intractable, without status migrainosus: Secondary | ICD-10-CM | POA: Insufficient documentation

## 2013-09-24 DIAGNOSIS — E785 Hyperlipidemia, unspecified: Secondary | ICD-10-CM | POA: Insufficient documentation

## 2013-09-24 MED ORDER — SUMATRIPTAN SUCCINATE 50 MG PO TABS: 50.0000 mg | ORAL_TABLET | ORAL | Status: DC | PRN

## 2013-09-24 MED ORDER — FENOFIBRATE 160 MG PO TABS: 160.0000 mg | ORAL_TABLET | Freq: Every day | ORAL | Status: DC

## 2013-09-24 MED ORDER — TOPIRAMATE 25 MG PO TABS: 25.0000 mg | ORAL_TABLET | Freq: Two times a day (BID) | ORAL | Status: DC

## 2013-09-24 MED ORDER — GABAPENTIN 300 MG PO CAPS: 300.0000 mg | ORAL_CAPSULE | Freq: Every day | ORAL | Status: DC

## 2013-09-24 NOTE — Progress Notes (Signed)
Patient here for follow up Complains of headache and generalized back pain and  Arm pain Here with interpreter

## 2013-09-24 NOTE — Patient Instructions (Signed)
Diabetes Mellitus and Food It is important for you to manage your blood sugar (glucose) level. Your blood glucose level can be greatly affected by what you eat. Eating healthier foods in the appropriate amounts throughout the day at about the same time each day will help you control your blood glucose level. It can also help slow or prevent worsening of your diabetes mellitus. Healthy eating may even help you improve the level of your blood pressure and reach or maintain a healthy weight.  HOW CAN FOOD AFFECT ME? Carbohydrates Carbohydrates affect your blood glucose level more than any other type of food. Your dietitian will help you determine how many carbohydrates to eat at each meal and teach you how to count carbohydrates. Counting carbohydrates is important to keep your blood glucose at a healthy level, especially if you are using insulin or taking certain medicines for diabetes mellitus. Alcohol Alcohol can cause sudden decreases in blood glucose (hypoglycemia), especially if you use insulin or take certain medicines for diabetes mellitus. Hypoglycemia can be a life-threatening condition. Symptoms of hypoglycemia (sleepiness, dizziness, and disorientation) are similar to symptoms of having too much alcohol.  If your health care provider has given you approval to drink alcohol, do so in moderation and use the following guidelines:  Women should not have more than one drink per day, and men should not have more than two drinks per day. One drink is equal to:  12 oz of beer.  5 oz of wine.  1 oz of hard liquor.  Do not drink on an empty stomach.  Keep yourself hydrated. Have water, diet soda, or unsweetened iced tea.  Regular soda, juice, and other mixers might contain a lot of carbohydrates and should be counted. WHAT FOODS ARE NOT RECOMMENDED? As you make food choices, it is important to remember that all foods are not the same. Some foods have fewer nutrients per serving than other  foods, even though they might have the same number of calories or carbohydrates. It is difficult to get your body what it needs when you eat foods with fewer nutrients. Examples of foods that you should avoid that are high in calories and carbohydrates but low in nutrients include:  Trans fats (most processed foods list trans fats on the Nutrition Facts label).  Regular soda.  Juice.  Candy.  Sweets, such as cake, pie, doughnuts, and cookies.  Fried foods. WHAT FOODS CAN I EAT? Have nutrient-rich foods, which will nourish your body and keep you healthy. The food you should eat also will depend on several factors, including:  The calories you need.  The medicines you take.  Your weight.  Your blood glucose level.  Your blood pressure level.  Your cholesterol level. You also should eat a variety of foods, including:  Protein, such as meat, poultry, fish, tofu, nuts, and seeds (lean animal proteins are best).  Fruits.  Vegetables.  Dairy products, such as milk, cheese, and yogurt (low fat is best).  Breads, grains, pasta, cereal, rice, and beans.  Fats such as olive oil, trans fat-free margarine, canola oil, avocado, and olives. DOES EVERYONE WITH DIABETES MELLITUS HAVE THE SAME MEAL PLAN? Because every person with diabetes mellitus is different, there is not one meal plan that works for everyone. It is very important that you meet with a dietitian who will help you create a meal plan that is just right for you. Document Released: 10/15/2004 Document Revised: 01/23/2013 Document Reviewed: 12/15/2012 ExitCare Patient Information 2015 [employer]. This   information is not intended to replace advice given to you by your health care provider. Make sure you discuss any questions you have with your health care provider. DASH Eating Plan DASH stands for "Dietary Approaches to Stop Hypertension." The DASH eating plan is a healthy eating plan that has been shown to reduce high  blood pressure (hypertension). Additional health benefits may include reducing the risk of type 2 diabetes mellitus, heart disease, and stroke. The DASH eating plan may also help with weight loss. WHAT DO I NEED TO KNOW ABOUT THE DASH EATING PLAN? For the DASH eating plan, you will follow these general guidelines:  Choose foods with a percent daily value for sodium of less than 5% (as listed on the food label).  Use salt-free seasonings or herbs instead of table salt or sea salt.  Check with your health care provider or pharmacist before using salt substitutes.  Eat lower-sodium products, often labeled as "lower sodium" or "no salt added."  Eat fresh foods.  Eat more vegetables, fruits, and low-fat dairy products.  Choose whole grains. Look for the word "whole" as the first word in the ingredient list.  Choose fish and skinless chicken or turkey more often than red meat. Limit fish, poultry, and meat to 6 oz (170 g) each day.  Limit sweets, desserts, sugars, and sugary drinks.  Choose heart-healthy fats.  Limit cheese to 1 oz (28 g) per day.  Eat more home-cooked food and less restaurant, buffet, and fast food.  Limit fried foods.  Cook foods using methods other than frying.  Limit canned vegetables. If you do use them, rinse them well to decrease the sodium.  When eating at a restaurant, ask that your food be prepared with less salt, or no salt if possible. WHAT FOODS CAN I EAT? Seek help from a dietitian for individual calorie needs. Grains Whole grain or whole wheat bread. Brown rice. Whole grain or whole wheat pasta. Quinoa, bulgur, and whole grain cereals. Low-sodium cereals. Corn or whole wheat flour tortillas. Whole grain cornbread. Whole grain crackers. Low-sodium crackers. Vegetables Fresh or frozen vegetables (raw, steamed, roasted, or grilled). Low-sodium or reduced-sodium tomato and vegetable juices. Low-sodium or reduced-sodium tomato sauce and paste. Low-sodium  or reduced-sodium canned vegetables.  Fruits All fresh, canned (in natural juice), or frozen fruits. Meat and Other Protein Products Ground beef (85% or leaner), grass-fed beef, or beef trimmed of fat. Skinless chicken or turkey. Ground chicken or turkey. Pork trimmed of fat. All fish and seafood. Eggs. Dried beans, peas, or lentils. Unsalted nuts and seeds. Unsalted canned beans. Dairy Low-fat dairy products, such as skim or 1% milk, 2% or reduced-fat cheeses, low-fat ricotta or cottage cheese, or plain low-fat yogurt. Low-sodium or reduced-sodium cheeses. Fats and Oils Tub margarines without trans fats. Light or reduced-fat mayonnaise and salad dressings (reduced sodium). Avocado. Safflower, olive, or canola oils. Natural peanut or almond butter. Other Unsalted popcorn and pretzels. The items listed above may not be a complete list of recommended foods or beverages. Contact your dietitian for more options. WHAT FOODS ARE NOT RECOMMENDED? Grains White bread. White pasta. White rice. Refined cornbread. Bagels and croissants. Crackers that contain trans fat. Vegetables Creamed or fried vegetables. Vegetables in a cheese sauce. Regular canned vegetables. Regular canned tomato sauce and paste. Regular tomato and vegetable juices. Fruits Dried fruits. Canned fruit in light or heavy syrup. Fruit juice. Meat and Other Protein Products Fatty cuts of meat. Ribs, chicken wings, bacon, sausage, bologna, salami, chitterlings, fatback, hot   dogs, bratwurst, and packaged luncheon meats. Salted nuts and seeds. Canned beans with salt. Dairy Whole or 2% milk, cream, half-and-half, and cream cheese. Whole-fat or sweetened yogurt. Full-fat cheeses or blue cheese. Nondairy creamers and whipped toppings. Processed cheese, cheese spreads, or cheese curds. Condiments Onion and garlic salt, seasoned salt, table salt, and sea salt. Canned and packaged gravies. Worcestershire sauce. Tartar sauce. Barbecue sauce.  Teriyaki sauce. Soy sauce, including reduced sodium. Steak sauce. Fish sauce. Oyster sauce. Cocktail sauce. Horseradish. Ketchup and mustard. Meat flavorings and tenderizers. Bouillon cubes. Hot sauce. Tabasco sauce. Marinades. Taco seasonings. Relishes. Fats and Oils Butter, stick margarine, lard, shortening, ghee, and bacon fat. Coconut, palm kernel, or palm oils. Regular salad dressings. Other Pickles and olives. Salted popcorn and pretzels. The items listed above may not be a complete list of foods and beverages to avoid. Contact your dietitian for more information. WHERE CAN I FIND MORE INFORMATION? National Heart, [employer], and [employer] Institute: www.nhlbi.nih.gov/health/health-topics/topics/dash/ Document Released: 01/07/2011 Document Revised: 06/04/2013 Document Reviewed: 11/22/2012 ExitCare Patient Information 2015 [employer]. This information is not intended to replace advice given to you by your health care provider. Make sure you discuss any questions you have with your health care provider.  

## 2013-09-24 NOTE — Progress Notes (Signed)
MRN: [id number] Name: April Mcmahon  Sex: female Age: 53 y.o. DOB: 03-02-1960  Allergies: Review of patient's allergies indicates no known allergies.  Chief Complaint  Patient presents with  . Headache  . generalized pain    HPI: Patient is 53 y.o. female who has to of hypertension, migraine headaches comes today for followup, complaining of right elbow pain on and off for several months denies any fall or trauma denies any numbness or weakness as per patient the pain is more worse at night, denies any fever chills chest and shortness of breath, she is requesting refill on her migraine medications, she had blood work done which was reviewed with the patient noticed impaired fasting glucose and vitamin D deficiency as per patient she already finished the prescription dose of vitamin D.  History reviewed. No pertinent past medical history.  History reviewed. No pertinent past surgical history.    Medication List       This list is accurate as of: 09/24/13  3:09 PM.  Always use your most recent med list.               aspirin 325 MG tablet  Take 650 mg by mouth every 6 (six) hours as needed. For headache     butalbital-acetaminophen-caffeine 50-500-40 MG per tablet  Commonly known as:  ESGIC PLUS  Take 1 tablet by mouth every 4 (four) hours as needed for pain.     cyclobenzaprine 5 MG tablet  Commonly known as:  FLEXERIL  Take 1 tablet (5 mg total) by mouth at bedtime as needed for muscle spasms.     fenofibrate 160 MG tablet  Take 1 tablet (160 mg total) by mouth daily.     gabapentin 300 MG capsule  Commonly known as:  NEURONTIN  Take 1 capsule (300 mg total) by mouth at bedtime.     hydrochlorothiazide 12.5 MG tablet  Commonly known as:  HYDRODIURIL  Take 1 tablet (12.5 mg total) by mouth daily.     meclizine 25 MG tablet  Commonly known as:  ANTIVERT  Take 1 tablet (25 mg total) by mouth 3 (three) times daily as needed.     SUMAtriptan 50 MG tablet  Commonly  known as:  IMITREX  Take 1 tablet (50 mg total) by mouth every 2 (two) hours as needed for migraine or headache. May repeat in 2 hours if headache persists or recurs.     topiramate 25 MG tablet  Commonly known as:  TOPAMAX  Take 1 tablet (25 mg total) by mouth 2 (two) times daily.     Vitamin D (Ergocalciferol) 50000 UNITS Caps capsule  Commonly known as:  DRISDOL  Take 1 capsule (50,000 Units total) by mouth every 7 (seven) days.        Meds ordered this encounter  Medications  . gabapentin (NEURONTIN) 300 MG capsule    Sig: Take 1 capsule (300 mg total) by mouth at bedtime.    Dispense:  90 capsule    Refill:  3  . topiramate (TOPAMAX) 25 MG tablet    Sig: Take 1 tablet (25 mg total) by mouth 2 (two) times daily.    Dispense:  60 tablet    Refill:  3  . SUMAtriptan (IMITREX) 50 MG tablet    Sig: Take 1 tablet (50 mg total) by mouth every 2 (two) hours as needed for migraine or headache. May repeat in 2 hours if headache persists or recurs.    Dispense:  10  tablet    Refill:  0  . fenofibrate 160 MG tablet    Sig: Take 1 tablet (160 mg total) by mouth daily.    Dispense:  30 tablet    Refill:  3     There is no immunization history on file for this patient.  History reviewed. No pertinent family history.  History  Substance Use Topics  . Smoking status: Current Some Day Smoker  . Smokeless tobacco: Not on file  . Alcohol Use: No    Review of Systems   As noted in HPI  Filed Vitals:   09/24/13 1434  BP: 144/78  Pulse: 94  Temp: 98.2 F (36.8 C)  Resp: 15    Physical Exam  Physical Exam  Constitutional: No distress.  Eyes: EOM are normal. Pupils are equal, round, and reactive to light.  Cardiovascular: Normal rate and regular rhythm.   Pulmonary/Chest: Breath sounds normal. No respiratory distress. She has no wheezes. She has no rales.  Musculoskeletal: She exhibits no edema.  Right elbow Minimal tenderness on the lateral epicondyle, good ROM     CBC    Component Value Date/Time   WBC 7.5 04/19/2013 0933   RBC 5.05 04/19/2013 0933   HGB 13.0 04/19/2013 0933   HCT 40.3 04/19/2013 0933   PLT 191 04/19/2013 0933   MCV 79.8 04/19/2013 0933   LYMPHSABS 2.9 04/19/2013 0933   MONOABS 0.3 04/19/2013 0933   EOSABS 0.2 04/19/2013 0933   BASOSABS 0.0 04/19/2013 0933    CMP     Component Value Date/Time   NA 141 04/19/2013 0933   K 4.5 04/19/2013 0933   CL 107 04/19/2013 0933   CO2 26 04/19/2013 0933   GLUCOSE 113* 04/19/2013 0933   BUN 8 04/19/2013 0933   CREATININE 0.67 04/19/2013 0933   CREATININE 0.60 10/30/2010 1141   CALCIUM 9.7 04/19/2013 0933   PROT 7.8 04/19/2013 0933   ALBUMIN 4.7 04/19/2013 0933   AST 27 04/19/2013 0933   ALT 29 04/19/2013 0933   ALKPHOS 102 04/19/2013 0933   BILITOT 0.3 04/19/2013 0933   GFRNONAA >89 04/19/2013 0933   GFRAA >89 04/19/2013 0933    Lab Results  Component Value Date/Time   CHOL 178 04/19/2013  9:33 AM    No components found with this basename: hga1c    Lab Results  Component Value Date/Time   AST 27 04/19/2013  9:33 AM    Assessment and Plan  Chronic migraine without aura without status migrainosus, not intractable - Plan: Continue with topiramate (TOPAMAX) 25 MG tablet, SUMAtriptan (IMITREX) 50 MG tablet when necessary for severe headache  Right elbow pain - Plan: Patient will take percent when necessary, Trial of gabapentin (NEURONTIN) 300 MG capsule each bedtime  Unspecified essential hypertension Blood pressure is borderline elevated, patient is on hydrochlorothiazide, I have advised patient for DASH diet.  Unspecified vitamin D deficiency Patient will start taking over-the-counter vitamin D 2000 units daily.  IFG (impaired fasting glucose) Advised patient for low carbohydrate diet.  Hypertriglyceridemia - Plan: Continue with fenofibrate 160 MG tablet, will repeat fasting lipid panel on the next visit.    Return in about 3 months (around 12/25/2013) for hypertension.  Lorayne Marek, MD

## 2013-10-24 ENCOUNTER — Ambulatory Visit: Payer: No Typology Code available for payment source | Attending: Internal Medicine

## 2014-05-03 ENCOUNTER — Ambulatory Visit: Payer: Self-pay | Attending: Internal Medicine | Admitting: Internal Medicine

## 2014-05-03 ENCOUNTER — Encounter: Payer: Self-pay | Admitting: Internal Medicine

## 2014-05-03 VITALS — BP 140/60 | HR 94 | Temp 98.0°F | Resp 16 | Wt 149.8 lb

## 2014-05-03 DIAGNOSIS — M25511 Pain in right shoulder: Secondary | ICD-10-CM | POA: Insufficient documentation

## 2014-05-03 DIAGNOSIS — I10 Essential (primary) hypertension: Secondary | ICD-10-CM | POA: Insufficient documentation

## 2014-05-03 DIAGNOSIS — R030 Elevated blood-pressure reading, without diagnosis of hypertension: Secondary | ICD-10-CM

## 2014-05-03 DIAGNOSIS — IMO0001 Reserved for inherently not codable concepts without codable children: Secondary | ICD-10-CM

## 2014-05-03 DIAGNOSIS — M25561 Pain in right knee: Secondary | ICD-10-CM

## 2014-05-03 DIAGNOSIS — E559 Vitamin D deficiency, unspecified: Secondary | ICD-10-CM | POA: Insufficient documentation

## 2014-05-03 DIAGNOSIS — Z7982 Long term (current) use of aspirin: Secondary | ICD-10-CM | POA: Insufficient documentation

## 2014-05-03 MED ORDER — HYDROCHLOROTHIAZIDE 12.5 MG PO TABS: 12.5000 mg | ORAL_TABLET | Freq: Every day | ORAL | Status: DC

## 2014-05-03 MED ORDER — CLONIDINE HCL 0.1 MG PO TABS
0.1000 mg | ORAL_TABLET | Freq: Once | ORAL | Status: AC
  Administered 2014-05-03: 0.1 mg via ORAL

## 2014-05-03 MED ORDER — IBUPROFEN 600 MG PO TABS: 600.0000 mg | ORAL_TABLET | Freq: Three times a day (TID) | ORAL | Status: DC | PRN

## 2014-05-03 NOTE — Progress Notes (Signed)
Patient here for follow up Complains of right leg pain and right shoulder pain that seem to be getting worse Patient states she sleeps with a pillow under her arm so her shoulder wont hurt so much Patients blood pressure is elevated but she has been our of her medication for greater than ten days

## 2014-05-03 NOTE — Progress Notes (Signed)
MRN: [id number] Name: April Mcmahon  Sex: female Age: 54 y.o. DOB: 1960/11/20  Allergies: Review of patient's allergies indicates no known allergies.  Chief Complaint  Patient presents with  . Follow-up    HPI: Patient is 54 y.o. female who has to of hypertension, vitamin D deficiency, accompanied with interpreter comes for followup, patient ran out of her blood pressure medications, her blood pressure initially was elevated, she was given clonidine, repeat manual blood pressure is 140/60, she denies any headache dizziness chest and shortness of breath, her major concern today is multiple joint pain including right shoulder and right knee, she denies any recent fall or trauma, she has not tried any pain medication.  History reviewed. No pertinent past medical history.  History reviewed. No pertinent past surgical history.    Medication List       This list is accurate as of: 05/03/14  4:07 PM.  Always use your most recent med list.               aspirin 325 MG tablet  Take 650 mg by mouth every 6 (six) hours as needed. For headache     butalbital-acetaminophen-caffeine 50-500-40 MG per tablet  Commonly known as:  ESGIC PLUS  Take 1 tablet by mouth every 4 (four) hours as needed for pain.     cyclobenzaprine 5 MG tablet  Commonly known as:  FLEXERIL  Take 1 tablet (5 mg total) by mouth at bedtime as needed for muscle spasms.     fenofibrate 160 MG tablet  Take 1 tablet (160 mg total) by mouth daily.     gabapentin 300 MG capsule  Commonly known as:  NEURONTIN  Take 1 capsule (300 mg total) by mouth at bedtime.     hydrochlorothiazide 12.5 MG tablet  Commonly known as:  HYDRODIURIL  Take 1 tablet (12.5 mg total) by mouth daily.     ibuprofen 600 MG tablet  Commonly known as:  ADVIL,MOTRIN  Take 1 tablet (600 mg total) by mouth every 8 (eight) hours as needed.     meclizine 25 MG tablet  Commonly known as:  ANTIVERT  Take 1 tablet (25 mg total) by mouth 3 (three)  times daily as needed.     SUMAtriptan 50 MG tablet  Commonly known as:  IMITREX  Take 1 tablet (50 mg total) by mouth every 2 (two) hours as needed for migraine or headache. May repeat in 2 hours if headache persists or recurs.     topiramate 25 MG tablet  Commonly known as:  TOPAMAX  Take 1 tablet (25 mg total) by mouth 2 (two) times daily.     Vitamin D (Ergocalciferol) 50000 UNITS Caps capsule  Commonly known as:  DRISDOL  Take 1 capsule (50,000 Units total) by mouth every 7 (seven) days.        Meds ordered this encounter  Medications  . cloNIDine (CATAPRES) tablet 0.1 mg    Sig:   . ibuprofen (ADVIL,MOTRIN) 600 MG tablet    Sig: Take 1 tablet (600 mg total) by mouth every 8 (eight) hours as needed.    Dispense:  30 tablet    Refill:  3  . hydrochlorothiazide (HYDRODIURIL) 12.5 MG tablet    Sig: Take 1 tablet (12.5 mg total) by mouth daily.    Dispense:  30 tablet    Refill:  3     There is no immunization history on file for this patient.  History reviewed. No pertinent family history.  History  Substance Use Topics  . Smoking status: Current Some Day Smoker  . Smokeless tobacco: Not on file  . Alcohol Use: No    Review of Systems   As noted in HPI  Filed Vitals:   05/03/14 1534  BP: 140/60  Pulse:   Temp:   Resp:     Physical Exam  Physical Exam  Cardiovascular: Normal rate and regular rhythm.   Pulmonary/Chest: Breath sounds normal. No respiratory distress. She has no wheezes. She has no rales.  Musculoskeletal:  Right shoulder minimal tenderness anteriorly, minimal limitation with full range of motion with abduction and extension.  Right knee some tenderness, no erythema or swelling, crepitation +    CBC    Component Value Date/Time   WBC 7.5 04/19/2013 0933   RBC 5.05 04/19/2013 0933   HGB 13.0 04/19/2013 0933   HCT 40.3 04/19/2013 0933   PLT 191 04/19/2013 0933   MCV 79.8 04/19/2013 0933   LYMPHSABS 2.9 04/19/2013 0933   MONOABS  0.3 04/19/2013 0933   EOSABS 0.2 04/19/2013 0933   BASOSABS 0.0 04/19/2013 0933    CMP     Component Value Date/Time   NA 141 04/19/2013 0933   K 4.5 04/19/2013 0933   CL 107 04/19/2013 0933   CO2 26 04/19/2013 0933   GLUCOSE 113* 04/19/2013 0933   BUN 8 04/19/2013 0933   CREATININE 0.67 04/19/2013 0933   CREATININE 0.60 10/30/2010 1141   CALCIUM 9.7 04/19/2013 0933   PROT 7.8 04/19/2013 0933   ALBUMIN 4.7 04/19/2013 0933   AST 27 04/19/2013 0933   ALT 29 04/19/2013 0933   ALKPHOS 102 04/19/2013 0933   BILITOT 0.3 04/19/2013 0933   GFRNONAA >89 04/19/2013 0933   GFRAA >89 04/19/2013 0933    Lab Results  Component Value Date/Time   CHOL 178 04/19/2013 09:33 AM    No components found for: HGA1C  Lab Results  Component Value Date/Time   AST 27 04/19/2013 09:33 AM    Assessment and Plan  Elevated blood pressure - Plan: cloNIDine (CATAPRES) tablet 0.1 mg  Right knee pain - Plan: ? arthritis, prescribed  ibuprofen (ADVIL,MOTRIN) 600 MG tablet, DG Knee Bilateral Standing AP  Right shoulder pain - Plan: ibuprofen (ADVIL,MOTRIN) 600 MG tablet, DG Shoulder Right  Vitamin D deficiency - Plan: will recheck Vit D  25 hydroxy (rtn osteoporosis monitoring)  Essential hypertension - Plan:resume back on  hydrochlorothiazide (HYDRODIURIL) 12.5 MG tablet, COMPLETE METABOLIC PANEL WITH GFR    Return in about 3 months (around 08/02/2014), or if symptoms worsen or fail to improve, for hypertension.   This note has been created with Surveyor, quantity. Any transcriptional errors are unintentional.    Lorayne Marek, MD

## 2014-05-03 NOTE — Patient Instructions (Signed)
DASH Eating Plan °DASH stands for "Dietary Approaches to Stop Hypertension." The DASH eating plan is a healthy eating plan that has been shown to reduce high blood pressure (hypertension). Additional health benefits may include reducing the risk of type 2 diabetes mellitus, heart disease, and stroke. The DASH eating plan may also help with weight loss. °WHAT DO I NEED TO KNOW ABOUT THE DASH EATING PLAN? °For the DASH eating plan, you will follow these general guidelines: °· Choose foods with a percent daily value for sodium of less than 5% (as listed on the food label). °· Use salt-free seasonings or herbs instead of table salt or sea salt. °· Check with your health care provider or pharmacist before using salt substitutes. °· Eat lower-sodium products, often labeled as "lower sodium" or "no salt added." °· Eat fresh foods. °· Eat more vegetables, fruits, and low-fat dairy products. °· Choose whole grains. Look for the word "whole" as the first word in the ingredient list. °· Choose fish and skinless chicken or turkey more often than red meat. Limit fish, poultry, and meat to 6 oz (170 g) each day. °· Limit sweets, desserts, sugars, and sugary drinks. °· Choose heart-healthy fats. °· Limit cheese to 1 oz (28 g) per day. °· Eat more home-cooked food and less restaurant, buffet, and fast food. °· Limit fried foods. °· Cook foods using methods other than frying. °· Limit canned vegetables. If you do use them, rinse them well to decrease the sodium. °· When eating at a restaurant, ask that your food be prepared with less salt, or no salt if possible. °WHAT FOODS CAN I EAT? °Seek help from a dietitian for individual calorie needs. °Grains °Whole grain or whole wheat bread. Brown rice. Whole grain or whole wheat pasta. Quinoa, bulgur, and whole grain cereals. Low-sodium cereals. Corn or whole wheat flour tortillas. Whole grain cornbread. Whole grain crackers. Low-sodium crackers. °Vegetables °Fresh or frozen vegetables  (raw, steamed, roasted, or grilled). Low-sodium or reduced-sodium tomato and vegetable juices. Low-sodium or reduced-sodium tomato sauce and paste. Low-sodium or reduced-sodium canned vegetables.  °Fruits °All fresh, canned (in natural juice), or frozen fruits. °Meat and Other Protein Products °Ground beef (85% or leaner), grass-fed beef, or beef trimmed of fat. Skinless chicken or turkey. Ground chicken or turkey. Pork trimmed of fat. All fish and seafood. Eggs. Dried beans, peas, or lentils. Unsalted nuts and seeds. Unsalted canned beans. °Dairy °Low-fat dairy products, such as skim or 1% milk, 2% or reduced-fat cheeses, low-fat ricotta or cottage cheese, or plain low-fat yogurt. Low-sodium or reduced-sodium cheeses. °Fats and Oils °Tub margarines without trans fats. Light or reduced-fat mayonnaise and salad dressings (reduced sodium). Avocado. Safflower, olive, or canola oils. Natural peanut or almond butter. °Other °Unsalted popcorn and pretzels. °The items listed above may not be a complete list of recommended foods or beverages. Contact your dietitian for more options. °WHAT FOODS ARE NOT RECOMMENDED? °Grains °White bread. White pasta. White rice. Refined cornbread. Bagels and croissants. Crackers that contain trans fat. °Vegetables °Creamed or fried vegetables. Vegetables in a cheese sauce. Regular canned vegetables. Regular canned tomato sauce and paste. Regular tomato and vegetable juices. °Fruits °Dried fruits. Canned fruit in light or heavy syrup. Fruit juice. °Meat and Other Protein Products °Fatty cuts of meat. Ribs, chicken wings, bacon, sausage, bologna, salami, chitterlings, fatback, hot dogs, bratwurst, and packaged luncheon meats. Salted nuts and seeds. Canned beans with salt. °Dairy °Whole or 2% milk, cream, half-and-half, and cream cheese. Whole-fat or sweetened yogurt. Full-fat   cheeses or blue cheese. Nondairy creamers and whipped toppings. Processed cheese, cheese spreads, or cheese  curds. °Condiments °Onion and garlic salt, seasoned salt, table salt, and sea salt. Canned and packaged gravies. Worcestershire sauce. Tartar sauce. Barbecue sauce. Teriyaki sauce. Soy sauce, including reduced sodium. Steak sauce. Fish sauce. Oyster sauce. Cocktail sauce. Horseradish. Ketchup and mustard. Meat flavorings and tenderizers. Bouillon cubes. Hot sauce. Tabasco sauce. Marinades. Taco seasonings. Relishes. °Fats and Oils °Butter, stick margarine, lard, shortening, ghee, and bacon fat. Coconut, palm kernel, or palm oils. Regular salad dressings. °Other °Pickles and olives. Salted popcorn and pretzels. °The items listed above may not be a complete list of foods and beverages to avoid. Contact your dietitian for more information. °WHERE CAN I FIND MORE INFORMATION? °[employer], [employer]: www.nhlbi.nih.gov/health/health-topics/topics/dash/ °Document Released: 01/07/2011 Document Revised: 06/04/2013 Document Reviewed: 11/22/2012 °[employer]® Patient Information ©2015 [employer]. This information is not intended to replace advice given to you by your health care provider. Make sure you discuss any questions you have with your health care provider. ° °

## 2014-05-04 LAB — COMPLETE METABOLIC PANEL WITH GFR
ALT: 16 U/L (ref 0–35)
AST: 18 U/L (ref 0–37)
Albumin: 4.6 g/dL (ref 3.5–5.2)
Alkaline Phosphatase: 102 U/L (ref 39–117)
BILIRUBIN TOTAL: 0.3 mg/dL (ref 0.2–1.2)
BUN: 10 mg/dL (ref 6–23)
CHLORIDE: 106 meq/L (ref 96–112)
CO2: 25 mEq/L (ref 19–32)
CREATININE: 0.6 mg/dL (ref 0.50–1.10)
Calcium: 9.9 mg/dL (ref 8.4–10.5)
GFR, Est African American: >89 mL/min
Glucose, Bld: 115 mg/dL — ABNORMAL HIGH (ref 70–99)
POTASSIUM: 4.8 meq/L (ref 3.5–5.3)
Sodium: 141 mEq/L (ref 135–145)
Total Protein: 7.7 g/dL (ref 6.0–8.3)

## 2014-05-04 LAB — VITAMIN D 25 HYDROXY (VIT D DEFICIENCY, FRACTURES): VIT D 25 HYDROXY: 19 ng/mL — AB (ref 30–100)

## 2014-05-06 ENCOUNTER — Ambulatory Visit (HOSPITAL_COMMUNITY)
Admission: RE | Admit: 2014-05-06 | Discharge: 2014-05-06 | Disposition: A | Discharge status: Home / Self Care | Payer: Self-pay | Source: Ambulatory Visit | Attending: Internal Medicine | Admitting: Internal Medicine

## 2014-05-06 DIAGNOSIS — M25562 Pain in left knee: Secondary | ICD-10-CM | POA: Insufficient documentation

## 2014-05-06 DIAGNOSIS — M25511 Pain in right shoulder: Secondary | ICD-10-CM | POA: Insufficient documentation

## 2014-05-06 DIAGNOSIS — M25561 Pain in right knee: Secondary | ICD-10-CM | POA: Insufficient documentation

## 2014-05-13 ENCOUNTER — Telehealth: Payer: Self-pay

## 2014-05-13 NOTE — Telephone Encounter (Signed)
-----   Message from Lorayne Marek, MD sent at 05/07/2014  9:24 AM EDT ----- : The patient know that her right shoulder x-rays negative  FINDINGS: There is no evidence of fracture or dislocation. There is no evidence of arthropathy or other focal bone abnormality. Soft tissues are unremarkable.

## 2014-05-13 NOTE — Telephone Encounter (Signed)
Interpreter line used [id number] Patient not available Message left on voice mail to return our call

## 2014-05-21 ENCOUNTER — Telehealth: Payer: Self-pay | Admitting: *Deleted

## 2014-05-21 NOTE — Telephone Encounter (Signed)
Interpreter line left message with family member to return my call.

## 2014-05-21 NOTE — Telephone Encounter (Signed)
-----   Message from Dorothe Pea, LPN sent at 4/72/0721  1:55 PM EDT -----   ----- Message -----    From: Lorayne Marek, MD    Sent: 05/06/2014   9:15 AM      To: Dorothe Pea, LPN  Blood work reviewed noticed impaired fasting glucose, call and advise patient for low carbohydrate diet.  noticed low vitamin D, call patient advise to start ergocalciferol 50,000 units once a week for the duration of  12 weeks.

## 2014-05-29 ENCOUNTER — Encounter: Payer: Self-pay | Admitting: Internal Medicine

## 2014-05-29 ENCOUNTER — Ambulatory Visit: Payer: 59 | Attending: Internal Medicine | Admitting: Internal Medicine

## 2014-05-29 VITALS — BP 140/80 | HR 83 | Temp 97.9°F | Resp 16 | Wt 152.4 lb

## 2014-05-29 DIAGNOSIS — E559 Vitamin D deficiency, unspecified: Secondary | ICD-10-CM

## 2014-05-29 DIAGNOSIS — I10 Essential (primary) hypertension: Secondary | ICD-10-CM | POA: Diagnosis not present

## 2014-05-29 DIAGNOSIS — Z720 Tobacco use: Secondary | ICD-10-CM | POA: Diagnosis not present

## 2014-05-29 DIAGNOSIS — R7301 Impaired fasting glucose: Secondary | ICD-10-CM | POA: Diagnosis not present

## 2014-05-29 DIAGNOSIS — M1710 Unilateral primary osteoarthritis, unspecified knee: Secondary | ICD-10-CM

## 2014-05-29 DIAGNOSIS — M179 Osteoarthritis of knee, unspecified: Secondary | ICD-10-CM

## 2014-05-29 MED ORDER — VITAMIN D (ERGOCALCIFEROL) 1.25 MG (50000 UNIT) PO CAPS: 50000.0000 [IU] | ORAL_CAPSULE | ORAL | Status: DC

## 2014-05-29 MED ORDER — IBUPROFEN 600 MG PO TABS
600.0000 mg | ORAL_TABLET | Freq: Three times a day (TID) | ORAL | Status: DC | PRN
Start: 2014-05-29 — End: 2014-05-29

## 2014-05-29 MED ORDER — IBUPROFEN 600 MG PO TABS: 600.0000 mg | ORAL_TABLET | Freq: Three times a day (TID) | ORAL | Status: DC | PRN

## 2014-05-29 NOTE — Progress Notes (Signed)
Patient here with interpreter  Here for follow up of bilateral leg and foot pain No other complaints

## 2014-05-29 NOTE — Patient Instructions (Addendum)
DASH Eating Plan DASH stands for "Dietary Approaches to Stop Hypertension." The DASH eating plan is a healthy eating plan that has been shown to reduce high blood pressure (hypertension). Additional health benefits may include reducing the risk of type 2 diabetes mellitus, heart disease, and stroke. The DASH eating plan may also help with weight loss. WHAT DO I NEED TO KNOW ABOUT THE DASH EATING PLAN? For the DASH eating plan, you will follow these general guidelines:  Choose foods with a percent daily value for sodium of less than 5% (as listed on the food label).  Use salt-free seasonings or herbs instead of table salt or sea salt.  Check with your health care provider or pharmacist before using salt substitutes.  Eat lower-sodium products, often labeled as "lower sodium" or "no salt added."  Eat fresh foods.  Eat more vegetables, fruits, and low-fat dairy products.  Choose whole grains. Look for the word "whole" as the first word in the ingredient list.  Choose fish and skinless chicken or turkey more often than red meat. Limit fish, poultry, and meat to 6 oz (170 g) each day.  Limit sweets, desserts, sugars, and sugary drinks.  Choose heart-healthy fats.  Limit cheese to 1 oz (28 g) per day.  Eat more home-cooked food and less restaurant, buffet, and fast food.  Limit fried foods.  Cook foods using methods other than frying.  Limit canned vegetables. If you do use them, rinse them well to decrease the sodium.  When eating at a restaurant, ask that your food be prepared with less salt, or no salt if possible. WHAT FOODS CAN I EAT? Seek help from a dietitian for individual calorie needs. Grains Whole grain or whole wheat bread. Brown rice. Whole grain or whole wheat pasta. Quinoa, bulgur, and whole grain cereals. Low-sodium cereals. Corn or whole wheat flour tortillas. Whole grain cornbread. Whole grain crackers. Low-sodium crackers. Vegetables Fresh or frozen vegetables  (raw, steamed, roasted, or grilled). Low-sodium or reduced-sodium tomato and vegetable juices. Low-sodium or reduced-sodium tomato sauce and paste. Low-sodium or reduced-sodium canned vegetables.  Fruits All fresh, canned (in natural juice), or frozen fruits. Meat and Other Protein Products Ground beef (85% or leaner), grass-fed beef, or beef trimmed of fat. Skinless chicken or turkey. Ground chicken or turkey. Pork trimmed of fat. All fish and seafood. Eggs. Dried beans, peas, or lentils. Unsalted nuts and seeds. Unsalted canned beans. Dairy Low-fat dairy products, such as skim or 1% milk, 2% or reduced-fat cheeses, low-fat ricotta or cottage cheese, or plain low-fat yogurt. Low-sodium or reduced-sodium cheeses. Fats and Oils Tub margarines without trans fats. Light or reduced-fat mayonnaise and salad dressings (reduced sodium). Avocado. Safflower, olive, or canola oils. Natural peanut or almond butter. Other Unsalted popcorn and pretzels. The items listed above may not be a complete list of recommended foods or beverages. Contact your dietitian for more options. WHAT FOODS ARE NOT RECOMMENDED? Grains White bread. White pasta. White rice. Refined cornbread. Bagels and croissants. Crackers that contain trans fat. Vegetables Creamed or fried vegetables. Vegetables in a cheese sauce. Regular canned vegetables. Regular canned tomato sauce and paste. Regular tomato and vegetable juices. Fruits Dried fruits. Canned fruit in light or heavy syrup. Fruit juice. Meat and Other Protein Products Fatty cuts of meat. Ribs, chicken wings, bacon, sausage, bologna, salami, chitterlings, fatback, hot dogs, bratwurst, and packaged luncheon meats. Salted nuts and seeds. Canned beans with salt. Dairy Whole or 2% milk, cream, half-and-half, and cream cheese. Whole-fat or sweetened yogurt. Full-fat   cheeses or blue cheese. Nondairy creamers and whipped toppings. Processed cheese, cheese spreads, or cheese  curds. Condiments Onion and garlic salt, seasoned salt, table salt, and sea salt. Canned and packaged gravies. Worcestershire sauce. Tartar sauce. Barbecue sauce. Teriyaki sauce. Soy sauce, including reduced sodium. Steak sauce. Fish sauce. Oyster sauce. Cocktail sauce. Horseradish. Ketchup and mustard. Meat flavorings and tenderizers. Bouillon cubes. Hot sauce. Tabasco sauce. Marinades. Taco seasonings. Relishes. Fats and Oils Butter, stick margarine, lard, shortening, ghee, and bacon fat. Coconut, palm kernel, or palm oils. Regular salad dressings. Other Pickles and olives. Salted popcorn and pretzels. The items listed above may not be a complete list of foods and beverages to avoid. Contact your dietitian for more information. WHERE CAN I FIND MORE INFORMATION? National Heart, Lung, and Blood Institute: www.nhlbi.nih.gov/health/health-topics/topics/dash/ Document Released: 01/07/2011 Document Revised: 06/04/2013 Document Reviewed: 11/22/2012 [employer] Patient Information 2015 [employer]. This information is not intended to replace advice given to you by your health care provider. Make sure you discuss any questions you have with your health care provider. Diabetes Mellitus and Food It is important for you to manage your blood sugar (glucose) level. Your blood glucose level can be greatly affected by what you eat. Eating healthier foods in the appropriate amounts throughout the day at about the same time each day will help you control your blood glucose level. It can also help slow or prevent worsening of your diabetes mellitus. Healthy eating may even help you improve the level of your blood pressure and reach or maintain a healthy weight.  HOW CAN FOOD AFFECT ME? Carbohydrates Carbohydrates affect your blood glucose level more than any other type of food. Your dietitian will help you determine how many carbohydrates to eat at each meal and teach you how to count carbohydrates. Counting  carbohydrates is important to keep your blood glucose at a healthy level, especially if you are using insulin or taking certain medicines for diabetes mellitus. Alcohol Alcohol can cause sudden decreases in blood glucose (hypoglycemia), especially if you use insulin or take certain medicines for diabetes mellitus. Hypoglycemia can be a life-threatening condition. Symptoms of hypoglycemia (sleepiness, dizziness, and disorientation) are similar to symptoms of having too much alcohol.  If your health care provider has given you approval to drink alcohol, do so in moderation and use the following guidelines:  Women should not have more than one drink per day, and men should not have more than two drinks per day. One drink is equal to:  12 oz of beer.  5 oz of wine.  1 oz of hard liquor.  Do not drink on an empty stomach.  Keep yourself hydrated. Have water, diet soda, or unsweetened iced tea.  Regular soda, juice, and other mixers might contain a lot of carbohydrates and should be counted. WHAT FOODS ARE NOT RECOMMENDED? As you make food choices, it is important to remember that all foods are not the same. Some foods have fewer nutrients per serving than other foods, even though they might have the same number of calories or carbohydrates. It is difficult to get your body what it needs when you eat foods with fewer nutrients. Examples of foods that you should avoid that are high in calories and carbohydrates but low in nutrients include:  Trans fats (most processed foods list trans fats on the Nutrition Facts label).  Regular soda.  Juice.  Candy.  Sweets, such as cake, pie, doughnuts, and cookies.  Fried foods. WHAT FOODS CAN I EAT? Have nutrient-rich foods,   which will nourish your body and keep you healthy. The food you should eat also will depend on several factors, including:  The calories you need.  The medicines you take.  Your weight.  Your blood glucose level.  Your  blood pressure level.  Your cholesterol level. You also should eat a variety of foods, including:  Protein, such as meat, poultry, fish, tofu, nuts, and seeds (lean animal proteins are best).  Fruits.  Vegetables.  Dairy products, such as milk, cheese, and yogurt (low fat is best).  Breads, grains, pasta, cereal, rice, and beans.  Fats such as olive oil, trans fat-free margarine, canola oil, avocado, and olives. DOES EVERYONE WITH DIABETES MELLITUS HAVE THE SAME MEAL PLAN? Because every person with diabetes mellitus is different, there is not one meal plan that works for everyone. It is very important that you meet with a dietitian who will help you create a meal plan that is just right for you. Document Released: 10/15/2004 Document Revised: 01/23/2013 Document Reviewed: 12/15/2012 [employer] Patient Information 2015 [employer]. This information is not intended to replace advice given to you by your health care provider. Make sure you discuss any questions you have with your health care provider.  

## 2014-05-29 NOTE — Progress Notes (Signed)
MRN: [id number] Name: April Mcmahon  Sex: female Age: 54 y.o. DOB: 16-Aug-1960  Allergies: Review of patient's allergies indicates no known allergies.  Chief Complaint  Patient presents with  . Follow-up    HPI: Patient is 54 y.o. female who has to of hypertension, vitamin D deficiency, today her blood pressure is borderline elevated, denies any headache dizziness chest  short of breath, as per patient she takes her blood pressure medication at night, she also is complaining of joint pain knee pain, patient had x-ray done which reported osteoarthritis, patient is requesting some pain medication. Also a blood work shows impaired fasting glucose, she's advised for low carbohydrate diet.  History reviewed. No pertinent past medical history.  History reviewed. No pertinent past surgical history.    Medication List       This list is accurate as of: 05/29/14  5:05 PM.  Always use your most recent med list.               aspirin 325 MG tablet  Take 650 mg by mouth every 6 (six) hours as needed. For headache     butalbital-acetaminophen-caffeine 50-500-40 MG per tablet  Commonly known as:  ESGIC PLUS  Take 1 tablet by mouth every 4 (four) hours as needed for pain.     cyclobenzaprine 5 MG tablet  Commonly known as:  FLEXERIL  Take 1 tablet (5 mg total) by mouth at bedtime as needed for muscle spasms.     fenofibrate 160 MG tablet  Take 1 tablet (160 mg total) by mouth daily.     gabapentin 300 MG capsule  Commonly known as:  NEURONTIN  Take 1 capsule (300 mg total) by mouth at bedtime.     hydrochlorothiazide 12.5 MG tablet  Commonly known as:  HYDRODIURIL  Take 1 tablet (12.5 mg total) by mouth daily.     ibuprofen 600 MG tablet  Commonly known as:  ADVIL,MOTRIN  Take 1 tablet (600 mg total) by mouth every 8 (eight) hours as needed.     meclizine 25 MG tablet  Commonly known as:  ANTIVERT  Take 1 tablet (25 mg total) by mouth 3 (three) times daily as needed.     SUMAtriptan 50 MG tablet  Commonly known as:  IMITREX  Take 1 tablet (50 mg total) by mouth every 2 (two) hours as needed for migraine or headache. May repeat in 2 hours if headache persists or recurs.     topiramate 25 MG tablet  Commonly known as:  TOPAMAX  Take 1 tablet (25 mg total) by mouth 2 (two) times daily.     Vitamin D (Ergocalciferol) 50000 UNITS Caps capsule  Commonly known as:  DRISDOL  Take 1 capsule (50,000 Units total) by mouth every 7 (seven) days.        Meds ordered this encounter  Medications  . DISCONTD: Vitamin D, Ergocalciferol, (DRISDOL) 50000 UNITS CAPS capsule    Sig: Take 1 capsule (50,000 Units total) by mouth every 7 (seven) days.    Dispense:  12 capsule    Refill:  0  . DISCONTD: ibuprofen (ADVIL,MOTRIN) 600 MG tablet    Sig: Take 1 tablet (600 mg total) by mouth every 8 (eight) hours as needed.    Dispense:  30 tablet    Refill:  3  . ibuprofen (ADVIL,MOTRIN) 600 MG tablet    Sig: Take 1 tablet (600 mg total) by mouth every 8 (eight) hours as needed.    Dispense:  30  tablet    Refill:  3  . Vitamin D, Ergocalciferol, (DRISDOL) 50000 UNITS CAPS capsule    Sig: Take 1 capsule (50,000 Units total) by mouth every 7 (seven) days.    Dispense:  12 capsule    Refill:  0     There is no immunization history on file for this patient.  History reviewed. No pertinent family history.  History  Substance Use Topics  . Smoking status: Current Some Day Smoker  . Smokeless tobacco: Not on file  . Alcohol Use: No    Review of Systems   As noted in HPI  Filed Vitals:   05/29/14 1650  BP: 140/80  Pulse:   Temp:   Resp:     Physical Exam  Physical Exam  Constitutional: No distress.  Eyes: EOM are normal. Pupils are equal, round, and reactive to light.  Cardiovascular: Normal rate and regular rhythm.   Pulmonary/Chest: Breath sounds normal. No respiratory distress. She has no wheezes. She has no rales.  Musculoskeletal: She exhibits no  edema.    CBC    Component Value Date/Time   WBC 7.5 04/19/2013 0933   RBC 5.05 04/19/2013 0933   HGB 13.0 04/19/2013 0933   HCT 40.3 04/19/2013 0933   PLT 191 04/19/2013 0933   MCV 79.8 04/19/2013 0933   LYMPHSABS 2.9 04/19/2013 0933   MONOABS 0.3 04/19/2013 0933   EOSABS 0.2 04/19/2013 0933   BASOSABS 0.0 04/19/2013 0933    CMP     Component Value Date/Time   NA 141 05/03/2014 1539   K 4.8 05/03/2014 1539   CL 106 05/03/2014 1539   CO2 25 05/03/2014 1539   GLUCOSE 115* 05/03/2014 1539   BUN 10 05/03/2014 1539   CREATININE 0.60 05/03/2014 1539   CREATININE 0.60 10/30/2010 1141   CALCIUM 9.9 05/03/2014 1539   PROT 7.7 05/03/2014 1539   ALBUMIN 4.6 05/03/2014 1539   AST 18 05/03/2014 1539   ALT 16 05/03/2014 1539   ALKPHOS 102 05/03/2014 1539   BILITOT 0.3 05/03/2014 1539   GFRNONAA >89 05/03/2014 1539   GFRAA >89 05/03/2014 1539    Lab Results  Component Value Date/Time   CHOL 178 04/19/2013 09:33 AM    No results found for: HGBA1C  Lab Results  Component Value Date/Time   AST 18 05/03/2014 03:39 PM    Assessment and Plan  Essential hypertension Blood pressure is borderline elevated, again reinforced DASH diet continue with hydrochlorothiazide, recheck blood chemistry on the following visit  Osteoarthritis of knee, unspecified laterality, unspecified osteoarthritis type - Plan: ibuprofen (ADVIL,MOTRIN) 600 MG tablet,   IFG (impaired fasting glucose) Advise patient for low fat diet  Vitamin D deficiency - Plan: Vitamin D, Ergocalciferol, (DRISDOL) 50000 UNITS CAPS capsule,     Return in about 3 months (around 08/28/2014).   This note has been created with Surveyor, quantity. Any transcriptional errors are unintentional.    Lorayne Marek, MD

## 2014-06-02 DIAGNOSIS — G8929 Other chronic pain: Secondary | ICD-10-CM

## 2014-06-02 DIAGNOSIS — M545 Low back pain, unspecified: Secondary | ICD-10-CM

## 2014-06-02 DIAGNOSIS — M25561 Pain in right knee: Secondary | ICD-10-CM

## 2014-06-02 HISTORY — DX: Other chronic pain: G89.29

## 2014-06-02 HISTORY — DX: Pain in right knee: M25.561

## 2014-06-02 HISTORY — DX: Low back pain, unspecified: M54.50

## 2014-06-03 ENCOUNTER — Ambulatory Visit: Payer: Self-pay

## 2014-06-17 ENCOUNTER — Ambulatory Visit: Payer: 59

## 2014-07-22 ENCOUNTER — Ambulatory Visit: Payer: 59

## 2014-09-03 ENCOUNTER — Encounter: Payer: Self-pay | Admitting: Internal Medicine

## 2014-09-03 ENCOUNTER — Ambulatory Visit: Payer: 59 | Attending: Internal Medicine | Admitting: Internal Medicine

## 2014-09-03 VITALS — BP 130/70 | HR 77 | Temp 98.8°F | Resp 16 | Wt 152.0 lb

## 2014-09-03 DIAGNOSIS — R7301 Impaired fasting glucose: Secondary | ICD-10-CM | POA: Insufficient documentation

## 2014-09-03 DIAGNOSIS — M179 Osteoarthritis of knee, unspecified: Secondary | ICD-10-CM | POA: Diagnosis not present

## 2014-09-03 DIAGNOSIS — M79671 Pain in right foot: Secondary | ICD-10-CM | POA: Diagnosis not present

## 2014-09-03 DIAGNOSIS — I10 Essential (primary) hypertension: Secondary | ICD-10-CM | POA: Insufficient documentation

## 2014-09-03 DIAGNOSIS — M1710 Unilateral primary osteoarthritis, unspecified knee: Secondary | ICD-10-CM

## 2014-09-03 DIAGNOSIS — Z720 Tobacco use: Secondary | ICD-10-CM | POA: Insufficient documentation

## 2014-09-03 MED ORDER — IBUPROFEN 600 MG PO TABS: 600.0000 mg | ORAL_TABLET | Freq: Three times a day (TID) | ORAL | Status: DC | PRN

## 2014-09-03 NOTE — Patient Instructions (Addendum)
DASH Eating Plan DASH stands for "Dietary Approaches to Stop Hypertension." The DASH eating plan is a healthy eating plan that has been shown to reduce high blood pressure (hypertension). Additional health benefits may include reducing the risk of type 2 diabetes mellitus, heart disease, and stroke. The DASH eating plan may also help with weight loss. WHAT DO I NEED TO KNOW ABOUT THE DASH EATING PLAN? For the DASH eating plan, you will follow these general guidelines:  Choose foods with a percent daily value for sodium of less than 5% (as listed on the food label).  Use salt-free seasonings or herbs instead of table salt or sea salt.  Check with your health care provider or pharmacist before using salt substitutes.  Eat lower-sodium products, often labeled as "lower sodium" or "no salt added."  Eat fresh foods.  Eat more vegetables, fruits, and low-fat dairy products.  Choose whole grains. Look for the word "whole" as the first word in the ingredient list.  Choose fish and skinless chicken or turkey more often than red meat. Limit fish, poultry, and meat to 6 oz (170 g) each day.  Limit sweets, desserts, sugars, and sugary drinks.  Choose heart-healthy fats.  Limit cheese to 1 oz (28 g) per day.  Eat more home-cooked food and less restaurant, buffet, and fast food.  Limit fried foods.  Cook foods using methods other than frying.  Limit canned vegetables. If you do use them, rinse them well to decrease the sodium.  When eating at a restaurant, ask that your food be prepared with less salt, or no salt if possible. WHAT FOODS CAN I EAT? Seek help from a dietitian for individual calorie needs. Grains Whole grain or whole wheat bread. Brown rice. Whole grain or whole wheat pasta. Quinoa, bulgur, and whole grain cereals. Low-sodium cereals. Corn or whole wheat flour tortillas. Whole grain cornbread. Whole grain crackers. Low-sodium crackers. Vegetables Fresh or frozen vegetables  (raw, steamed, roasted, or grilled). Low-sodium or reduced-sodium tomato and vegetable juices. Low-sodium or reduced-sodium tomato sauce and paste. Low-sodium or reduced-sodium canned vegetables.  Fruits All fresh, canned (in natural juice), or frozen fruits. Meat and Other Protein Products Ground beef (85% or leaner), grass-fed beef, or beef trimmed of fat. Skinless chicken or turkey. Ground chicken or turkey. Pork trimmed of fat. All fish and seafood. Eggs. Dried beans, peas, or lentils. Unsalted nuts and seeds. Unsalted canned beans. Dairy Low-fat dairy products, such as skim or 1% milk, 2% or reduced-fat cheeses, low-fat ricotta or cottage cheese, or plain low-fat yogurt. Low-sodium or reduced-sodium cheeses. Fats and Oils Tub margarines without trans fats. Light or reduced-fat mayonnaise and salad dressings (reduced sodium). Avocado. Safflower, olive, or canola oils. Natural peanut or almond butter. Other Unsalted popcorn and pretzels. The items listed above may not be a complete list of recommended foods or beverages. Contact your dietitian for more options. WHAT FOODS ARE NOT RECOMMENDED? Grains White bread. White pasta. White rice. Refined cornbread. Bagels and croissants. Crackers that contain trans fat. Vegetables Creamed or fried vegetables. Vegetables in a cheese sauce. Regular canned vegetables. Regular canned tomato sauce and paste. Regular tomato and vegetable juices. Fruits Dried fruits. Canned fruit in light or heavy syrup. Fruit juice. Meat and Other Protein Products Fatty cuts of meat. Ribs, chicken wings, bacon, sausage, bologna, salami, chitterlings, fatback, hot dogs, bratwurst, and packaged luncheon meats. Salted nuts and seeds. Canned beans with salt. Dairy Whole or 2% milk, cream, half-and-half, and cream cheese. Whole-fat or sweetened yogurt. Full-fat   cheeses or blue cheese. Nondairy creamers and whipped toppings. Processed cheese, cheese spreads, or cheese  curds. Condiments Onion and garlic salt, seasoned salt, table salt, and sea salt. Canned and packaged gravies. Worcestershire sauce. Tartar sauce. Barbecue sauce. Teriyaki sauce. Soy sauce, including reduced sodium. Steak sauce. Fish sauce. Oyster sauce. Cocktail sauce. Horseradish. Ketchup and mustard. Meat flavorings and tenderizers. Bouillon cubes. Hot sauce. Tabasco sauce. Marinades. Taco seasonings. Relishes. Fats and Oils Butter, stick margarine, lard, shortening, ghee, and bacon fat. Coconut, palm kernel, or palm oils. Regular salad dressings. Other Pickles and olives. Salted popcorn and pretzels. The items listed above may not be a complete list of foods and beverages to avoid. Contact your dietitian for more information. WHERE CAN I FIND MORE INFORMATION? National Heart, Lung, and Blood Institute: travelstabloid.com Document Released: 01/07/2011 Document Revised: 06/04/2013 Document Reviewed: 11/22/2012 [facility] Patient Information 2015 [place], [place]. This information is not intended to replace advice given to you by your health care provider. Make sure you discuss any questions you have with your health care provider. Plantar Fasciitis Plantar fasciitis is a common condition that causes foot pain. It is soreness (inflammation) of the band of tough fibrous tissue on the bottom of the foot that runs from the heel bone (calcaneus) to the ball of the foot. The cause of this soreness may be from excessive standing, poor fitting shoes, running on hard surfaces, being overweight, having an abnormal walk, or overuse (this is common in runners) of the painful foot or feet. It is also common in aerobic exercise dancers and ballet dancers. SYMPTOMS  Most people with plantar fasciitis complain of:  Severe pain in the morning on the bottom of their foot especially when taking the first steps out of bed. This pain recedes after a few minutes of walking.  Severe pain  is experienced also during walking following a long period of inactivity.  Pain is worse when walking barefoot or up stairs DIAGNOSIS   Your caregiver will diagnose this condition by examining and feeling your foot.  Special tests such as X-rays of your foot, are usually not needed. PREVENTION   Consult a sports medicine professional before beginning a new exercise program.  Walking programs offer a good workout. With walking there is a lower chance of overuse injuries common to runners. There is less impact and less jarring of the joints.  Begin all new exercise programs slowly. If problems or pain develop, decrease the amount of time or distance until you are at a comfortable level.  Wear good shoes and replace them regularly.  Stretch your foot and the heel cords at the back of the ankle (Achilles tendon) both before and after exercise.  Run or exercise on even surfaces that are not hard. For example, asphalt is better than pavement.  Do not run barefoot on hard surfaces.  If using a treadmill, vary the incline.  Do not continue to workout if you have foot or joint problems. Seek professional help if they do not improve. HOME CARE INSTRUCTIONS   Avoid activities that cause you pain until you recover.  Use ice or cold packs on the problem or painful areas after working out.  Only take over-the-counter or prescription medicines for pain, discomfort, or fever as directed by your caregiver.  Soft shoe inserts or athletic shoes with air or gel sole cushions may be helpful.  If problems continue or become more severe, consult a sports medicine caregiver or your own health care provider. Cortisone is a potent  anti-inflammatory medication that may be injected into the painful area. You can discuss this treatment with your caregiver. MAKE SURE YOU:   Understand these instructions.  Will watch your condition.  Will get help right away if you are not doing well or get  worse. Document Released: 10/13/2000 Document Revised: 04/12/2011 Document Reviewed: 12/13/2007 [facility] Patient Information 2015 [place], [place]. This information is not intended to replace advice given to you by your health care provider. Make sure you discuss any questions you have with your health care provider.

## 2014-09-03 NOTE — Progress Notes (Signed)
Patient here with interpreter Her for follow up on her blood pressure and also for her knee pain Also having some pain to her left foot

## 2014-09-03 NOTE — Progress Notes (Signed)
MRN: [id number] Name: April Mcmahon  Sex: female Age: 54 y.o. DOB: [birth year]  Allergies: Review of patient's allergies indicates no known allergies.  Chief Complaint  Patient presents with  . Follow-up    HPI: Patient is 54 y.o. female who has history of hypertension, osteoarthritis, vitamin D deficiency, comes today for followup as per patient she is taking her blood pressure medication regularly, initially her blood pressure was elevated, repeat manual blood pressure is 130/70, she denies any headache dizziness chest and shortness of breath, today she is also complaining of right foot heel pain which is worse in the morning, she denies any recent fall or trauma.  History reviewed. No pertinent past medical history.  History reviewed. No pertinent past surgical history.    Medication List       This list is accurate as of: 09/03/14 12:24 PM.  Always use your most recent med list.               aspirin 325 MG tablet  Take 650 mg by mouth every 6 (six) hours as needed. For headache     butalbital-acetaminophen-caffeine 50-500-40 MG per tablet  Commonly known as:  ESGIC PLUS  Take 1 tablet by mouth every 4 (four) hours as needed for pain.     cyclobenzaprine 5 MG tablet  Commonly known as:  FLEXERIL  Take 1 tablet (5 mg total) by mouth at bedtime as needed for muscle spasms.     fenofibrate 160 MG tablet  Take 1 tablet (160 mg total) by mouth daily.     gabapentin 300 MG capsule  Commonly known as:  NEURONTIN  Take 1 capsule (300 mg total) by mouth at bedtime.     hydrochlorothiazide 12.5 MG tablet  Commonly known as:  HYDRODIURIL  Take 1 tablet (12.5 mg total) by mouth daily.     ibuprofen 600 MG tablet  Commonly known as:  ADVIL,MOTRIN  Take 1 tablet (600 mg total) by mouth every 8 (eight) hours as needed.     meclizine 25 MG tablet  Commonly known as:  ANTIVERT  Take 1 tablet (25 mg total) by mouth 3 (three) times daily as needed.     SUMAtriptan 50 MG  tablet  Commonly known as:  IMITREX  Take 1 tablet (50 mg total) by mouth every 2 (two) hours as needed for migraine or headache. May repeat in 2 hours if headache persists or recurs.     topiramate 25 MG tablet  Commonly known as:  TOPAMAX  Take 1 tablet (25 mg total) by mouth 2 (two) times daily.     Vitamin D (Ergocalciferol) 50000 UNITS Caps capsule  Commonly known as:  DRISDOL  Take 1 capsule (50,000 Units total) by mouth every 7 (seven) days.        Meds ordered this encounter  Medications  . ibuprofen (ADVIL,MOTRIN) 600 MG tablet    Sig: Take 1 tablet (600 mg total) by mouth every 8 (eight) hours as needed.    Dispense:  30 tablet    Refill:  3     There is no immunization history on file for this patient.  History reviewed. No pertinent family history.  History  Substance Use Topics  . Smoking status: Current Some Day Smoker  . Smokeless tobacco: Not on file  . Alcohol Use: No    Review of Systems   As noted in HPI  Filed Vitals:   09/03/14 1217  BP: 130/70  Pulse:   Temp:  Resp:     Physical Exam  Physical Exam  Constitutional: No distress.  Eyes: EOM are normal. Pupils are equal, round, and reactive to light.  Cardiovascular: Normal rate and regular rhythm.   Pulmonary/Chest: Breath sounds normal. No respiratory distress. She has no wheezes. She has no rales.  Musculoskeletal:  Right heel tenderness with deep palpation, no skin breakdown erythema or any signs of infection.    Labs   Lab Results  Component Value Date   WBC 7.5 04/19/2013   HGB 13.0 04/19/2013   HCT 40.3 04/19/2013   PLT 191 04/19/2013   GLUCOSE 115* 05/03/2014   CHOL 178 04/19/2013   TRIG 282* 04/19/2013   HDL 47 04/19/2013   LDLCALC 75 04/19/2013   ALT 16 05/03/2014   AST 18 05/03/2014   NA 141 05/03/2014   K 4.8 05/03/2014   CL 106 05/03/2014   CREATININE 0.60 05/03/2014   BUN 10 05/03/2014   CO2 25 05/03/2014   TSH 1.492 04/19/2013    No results found  for: HGBA1C   Assessment and Plan  Essential hypertension - Plan: blood pressure is well controlled continue with hydrochlorothiazide, repeat blood chemistry COMPLETE METABOLIC PANEL WITH GFR  Osteoarthritis of knee, unspecified laterality, unspecified osteoarthritis type - Plan: ibuprofen (ADVIL,MOTRIN) 600 MG tablet  Heel pain, right - Plan: likely plantar fasciitis, patient is given the handout , prescribed ibuprofen (ADVIL,MOTRIN) 600 MG tablet  IFG (impaired fasting glucose) - Plan: advised patient for low carbohydrate, check Hemoglobin A1c   Return in about 3 months (around 12/04/2014), or if symptoms worsen or fail to improve.   This note has been created with Surveyor, quantity. Any transcriptional errors are unintentional.    Lorayne Marek, MD

## 2014-09-04 ENCOUNTER — Telehealth: Payer: Self-pay

## 2014-09-04 LAB — COMPLETE METABOLIC PANEL WITH GFR
ALBUMIN: 4.3 g/dL (ref 3.6–5.1)
ALT: 19 U/L (ref 6–29)
AST: 21 U/L (ref 10–35)
Alkaline Phosphatase: 109 U/L (ref 33–130)
BUN: 8 mg/dL (ref 7–25)
CHLORIDE: 109 mmol/L (ref 98–110)
CO2: 23 mmol/L (ref 20–31)
Calcium: 9.5 mg/dL (ref 8.6–10.4)
Creat: 0.62 mg/dL (ref 0.50–1.05)
GFR, Est African American: >89 mL/min (ref >=60)
GFR, Est Non African American: >89 mL/min (ref >=60)
Glucose, Bld: 141 mg/dL — ABNORMAL HIGH (ref 65–99)
Potassium: 4.9 mmol/L (ref 3.5–5.3)
Sodium: 137 mmol/L (ref 135–146)
Total Bilirubin: 0.3 mg/dL (ref 0.2–1.2)
Total Protein: 7.4 g/dL (ref 6.1–8.1)

## 2014-09-04 LAB — HEMOGLOBIN A1C
Hgb A1c MFr Bld: 7.2 % — ABNORMAL HIGH (ref <5.7)
MEAN PLASMA GLUCOSE: 160 mg/dL — AB (ref <117)

## 2014-09-04 MED ORDER — METFORMIN HCL 500 MG PO TABS: 500.0000 mg | ORAL_TABLET | Freq: Two times a day (BID) | ORAL | Status: DC

## 2014-09-04 NOTE — Telephone Encounter (Signed)
-----   Message from Lorayne Marek, MD sent at 09/04/2014  9:25 AM EDT ----- Call and let the patient know that her hemoglobin A1c is 7.2%, patient has diabetes, advise patient for low carbohydrate diet, start taking metformin 500 mg twice a day, we'll check A1c in 3 months.

## 2014-09-04 NOTE — Telephone Encounter (Signed)
Interpreter line used [place] ID# [phone] Patient not available Left message on machine to return our call

## 2014-10-04 ENCOUNTER — Telehealth: Payer: Self-pay | Admitting: Internal Medicine

## 2014-10-04 ENCOUNTER — Ambulatory Visit: Payer: 59 | Attending: Internal Medicine

## 2014-10-04 NOTE — Telephone Encounter (Signed)
Pt. Needs refill on her vitamin D....please call patiebnt

## 2014-10-14 ENCOUNTER — Ambulatory Visit: Payer: 59 | Attending: Family Medicine

## 2014-11-02 DIAGNOSIS — E119 Type 2 diabetes mellitus without complications: Secondary | ICD-10-CM

## 2014-11-02 HISTORY — DX: Type 2 diabetes mellitus without complications: E11.9

## 2014-12-02 ENCOUNTER — Encounter: Payer: Self-pay | Admitting: Internal Medicine

## 2014-12-02 ENCOUNTER — Ambulatory Visit: Payer: 59 | Attending: Internal Medicine | Admitting: Internal Medicine

## 2014-12-02 VITALS — BP 130/78 | HR 78 | Temp 98.0°F | Resp 16 | Ht 60.0 in | Wt 148.6 lb

## 2014-12-02 DIAGNOSIS — M79671 Pain in right foot: Secondary | ICD-10-CM

## 2014-12-02 DIAGNOSIS — E119 Type 2 diabetes mellitus without complications: Secondary | ICD-10-CM | POA: Diagnosis not present

## 2014-12-02 DIAGNOSIS — E781 Pure hyperglyceridemia: Secondary | ICD-10-CM | POA: Diagnosis not present

## 2014-12-02 DIAGNOSIS — I10 Essential (primary) hypertension: Secondary | ICD-10-CM

## 2014-12-02 DIAGNOSIS — M25561 Pain in right knee: Secondary | ICD-10-CM | POA: Diagnosis not present

## 2014-12-02 LAB — LIPID PANEL
CHOL/HDL RATIO: 4.7 ratio (ref <=5.0)
CHOLESTEROL: 165 mg/dL (ref 125–200)
HDL: 35 mg/dL — AB (ref >=46)
LDL Cholesterol: 74 mg/dL (ref <130)
TRIGLYCERIDES: 281 mg/dL — AB (ref <150)
VLDL: 56 mg/dL — AB (ref <30)

## 2014-12-02 LAB — GLUCOSE, POCT (MANUAL RESULT ENTRY): POC Glucose: 255 mg/dl — AB (ref 70–99)

## 2014-12-02 LAB — POCT GLYCOSYLATED HEMOGLOBIN (HGB A1C): Hemoglobin A1C: 6.9

## 2014-12-02 MED ORDER — FENOFIBRATE 160 MG PO TABS: 160.0000 mg | ORAL_TABLET | Freq: Every day | ORAL | Status: DC

## 2014-12-02 MED ORDER — IBUPROFEN 600 MG PO TABS: 600.0000 mg | ORAL_TABLET | Freq: Three times a day (TID) | ORAL | Status: DC | PRN

## 2014-12-02 MED ORDER — GABAPENTIN 300 MG PO CAPS: 300.0000 mg | ORAL_CAPSULE | Freq: Every day | ORAL | Status: DC

## 2014-12-02 MED ORDER — METFORMIN HCL 500 MG PO TABS: 500.0000 mg | ORAL_TABLET | Freq: Two times a day (BID) | ORAL | Status: DC

## 2014-12-02 MED ORDER — HYDROCHLOROTHIAZIDE 12.5 MG PO TABS: 12.5000 mg | ORAL_TABLET | Freq: Every day | ORAL | Status: DC

## 2014-12-02 NOTE — Progress Notes (Signed)
Patient ID: April Mcmahon, female   DOB: 11/13/1960, 54 y.o.   MRN: [id number]  CC: generalized pain, diabetes   HPI: April Mcmahon is a 54 y.o. female here today for a follow up visit.  Patient has past medical history of hypertension, migraine headaches, and diabetes mellitus. Patient reports that she takes her Metformin daily without complications. She denies hypoglycemic events, polyuria/dipsia, blurred vision. She reports some neuropathy of her BLE but has been on gabapentin for pain. She has right foot pain in her heel and arch of foot for 4 months. She states that she when she gets out of bed and when she sits for a period of time and then gets up to walk. She then reports that she has headaches, back pain, right knee pain, and left shoulder pain all described as a ache. She states that her general;ized pain keeps her from sleeping at night. She denies bowel or bladder dysfunction and has only tried gabapentin for pain.   No Known Allergies No past medical history on file. Current Outpatient Prescriptions on File Prior to Visit  Medication Sig Dispense Refill  . aspirin 325 MG tablet Take 650 mg by mouth every 6 (six) hours as needed. For headache    . hydrochlorothiazide (HYDRODIURIL) 12.5 MG tablet Take 1 tablet (12.5 mg total) by mouth daily. 30 tablet 3  . ibuprofen (ADVIL,MOTRIN) 600 MG tablet Take 1 tablet (600 mg total) by mouth every 8 (eight) hours as needed. 30 tablet 3  . metFORMIN (GLUCOPHAGE) 500 MG tablet Take 1 tablet (500 mg total) by mouth 2 (two) times daily with a meal. 180 tablet 3  . butalbital-acetaminophen-caffeine (ESGIC PLUS) 50-500-40 MG per tablet Take 1 tablet by mouth every 4 (four) hours as needed for pain. 30 tablet 0  . cyclobenzaprine (FLEXERIL) 5 MG tablet Take 1 tablet (5 mg total) by mouth at bedtime as needed for muscle spasms. 30 tablet 0  . fenofibrate 160 MG tablet Take 1 tablet (160 mg total) by mouth daily. 30 tablet 3  . gabapentin (NEURONTIN) 300 MG  capsule Take 1 capsule (300 mg total) by mouth at bedtime. 90 capsule 3  . meclizine (ANTIVERT) 25 MG tablet Take 1 tablet (25 mg total) by mouth 3 (three) times daily as needed. 30 tablet 2  . SUMAtriptan (IMITREX) 50 MG tablet Take 1 tablet (50 mg total) by mouth every 2 (two) hours as needed for migraine or headache. May repeat in 2 hours if headache persists or recurs. 10 tablet 0  . topiramate (TOPAMAX) 25 MG tablet Take 1 tablet (25 mg total) by mouth 2 (two) times daily. 60 tablet 3  . Vitamin D, Ergocalciferol, (DRISDOL) 50000 UNITS CAPS capsule Take 1 capsule (50,000 Units total) by mouth every 7 (seven) days. 12 capsule 0   No current facility-administered medications on file prior to visit.   No family history on file. Social History   Social History  . Marital Status: Married    Spouse Name: N/A  . Number of Children: N/A  . Years of Education: N/A   Occupational History  . Not on file.   Social History Main Topics  . Smoking status: Current Some Day Smoker  . Smokeless tobacco: Not on file  . Alcohol Use: No  . Drug Use: No  . Sexual Activity: Not on file   Other Topics Concern  . Not on file   Social History Narrative    Review of Systems: Other than what is stated in HPI,  all other systems are negative.   Objective:   Filed Vitals:   12/02/14 1146  BP: 148/83  Pulse: 78  Temp: 98 F (36.7 C)  Resp: 16    Physical Exam  Constitutional: She is oriented to person, place, and time.  Cardiovascular: Normal rate, regular rhythm and normal heart sounds.   Pulses:      Dorsalis pedis pulses are 2+ on the right side, and 2+ on the left side.       Posterior tibial pulses are 2+ on the right side, and 2+ on the left side.  Pulmonary/Chest: Effort normal and breath sounds normal.  Musculoskeletal: Normal range of motion. She exhibits no edema or tenderness.  Feet:  Right Foot:  Protective Sensation: 10 sites tested.2 sites sensed. Skin Integrity: Negative  for skin breakdown.  Left Foot:  Protective Sensation: 10 sites tested. 3 sites sensed. Skin Integrity: Negative for skin breakdown.  Neurological: She is alert and oriented to person, place, and time.  Skin: Skin is warm and dry.  Psychiatric: She has a normal mood and affect.     Lab Results  Component Value Date   WBC 7.5 04/19/2013   HGB 13.0 04/19/2013   HCT 40.3 04/19/2013   MCV 79.8 04/19/2013   PLT 191 04/19/2013   Lab Results  Component Value Date   CREATININE 0.62 09/03/2014   BUN 8 09/03/2014   NA 137 09/03/2014   K 4.9 09/03/2014   CL 109 09/03/2014   CO2 23 09/03/2014    Lab Results  Component Value Date   HGBA1C 6.90 12/02/2014   Lipid Panel     Component Value Date/Time   CHOL 178 04/19/2013 0933   TRIG 282* 04/19/2013 0933   HDL 47 04/19/2013 0933   CHOLHDL 3.8 04/19/2013 0933   VLDL 56* 04/19/2013 0933   LDLCALC 75 04/19/2013 0933       Assessment and plan:   April Mcmahon was seen today for follow-up.  Diagnoses and all orders for this visit:  Type 2 diabetes mellitus without complication, without long-term current use of insulin (HCC) -     Glucose (CBG) -     HgB A1c -     Flu Vaccine QUAD 36+ mos PF IM (Fluarix & Fluzone Quad PF) -     metFORMIN (GLUCOPHAGE) 500 MG tablet; Take 1 tablet (500 mg total) by mouth 2 (two) times daily with a meal. Patients diabetes is well control as evidence by consistently low a1c.  Patient will continue with current therapy and continue to make necessary lifestyle changes.  Reviewed foot care, diet, exercise, annual health maintenance with patient.   Essential hypertension -     hydrochlorothiazide (HYDRODIURIL) 12.5 MG tablet; Take 1 tablet (12.5 mg total) by mouth daily. Patient blood pressure is stable and may continue on current medication.  Education on diet, exercise, and modifiable risk factors discussed. Will obtain appropriate labs as needed. Will follow up in 3-6 months.   Hypertriglyceridemia -      fenofibrate 160 MG tablet; Take 1 tablet (160 mg total) by mouth daily. For triglyucerides -     Lipid panel Education provided on proper lifestyle changes in order to lower cholesterol. Patient advised to maintain healthy weight and to keep total fat intake at 25-35% of total calories and carbohydrates 50-60% of total daily calories. Explained how high cholesterol places patient at risk for heart disease. Patient placed on appropriate medication and repeat labs in 6 months   Right knee pain -  gabapentin (NEURONTIN) 300 MG capsule; Take 1 capsule (300 mg total) by mouth at bedtime. -     ibuprofen (ADVIL,MOTRIN) 600 MG tablet; Take 1 tablet (600 mg total) by mouth every 8 (eight) hours as needed.  Heel pain, right Pain sounds consistent with plantar fascitis. Will refer to podiatry for diabetic foot exam. I alos have went over exercises to stretch fascia to help with pain.  Due to language barrier, an interpreter was present during the history-taking and subsequent discussion (and for part of the physical exam) with this patient.  Return in about 3 months (around 03/04/2015).       Lance Bosch, [facility] [phone] 12/02/2014, 11:54 AM

## 2014-12-02 NOTE — Patient Instructions (Signed)

## 2014-12-02 NOTE — Progress Notes (Signed)
Patient here for follow up on her leg pain Headaches and just generalized pain

## 2014-12-09 ENCOUNTER — Telehealth: Payer: Self-pay

## 2014-12-09 NOTE — Telephone Encounter (Signed)
Interpreter line used [place] ID# [id number] Called patient  Patient not available Message left on voice mail to return our call

## 2014-12-09 NOTE — Telephone Encounter (Signed)
-----   Message from Lance Bosch, NP sent at 12/06/2014  9:07 AM EDT ----- Triglycerides have not changes they are still elevated. Make sure she has been taking the Fenofibrate, if she is adamant about compliance I may need to change the medication because it may not be working

## 2015-02-10 ENCOUNTER — Ambulatory Visit: Payer: Self-pay | Admitting: Internal Medicine

## 2015-02-27 ENCOUNTER — Ambulatory Visit: Payer: Self-pay | Attending: Internal Medicine | Admitting: Internal Medicine

## 2015-02-27 ENCOUNTER — Encounter: Payer: Self-pay | Admitting: Internal Medicine

## 2015-02-27 VITALS — BP 152/86 | HR 96 | Temp 98.7°F | Resp 16 | Ht 60.0 in | Wt 151.2 lb

## 2015-02-27 DIAGNOSIS — Z7982 Long term (current) use of aspirin: Secondary | ICD-10-CM | POA: Insufficient documentation

## 2015-02-27 DIAGNOSIS — M79671 Pain in right foot: Secondary | ICD-10-CM

## 2015-02-27 DIAGNOSIS — Z7984 Long term (current) use of oral hypoglycemic drugs: Secondary | ICD-10-CM | POA: Insufficient documentation

## 2015-02-27 DIAGNOSIS — F17200 Nicotine dependence, unspecified, uncomplicated: Secondary | ICD-10-CM | POA: Insufficient documentation

## 2015-02-27 DIAGNOSIS — G8929 Other chronic pain: Secondary | ICD-10-CM | POA: Insufficient documentation

## 2015-02-27 DIAGNOSIS — Z79899 Other long term (current) drug therapy: Secondary | ICD-10-CM | POA: Insufficient documentation

## 2015-02-27 DIAGNOSIS — E119 Type 2 diabetes mellitus without complications: Secondary | ICD-10-CM | POA: Insufficient documentation

## 2015-02-27 DIAGNOSIS — I10 Essential (primary) hypertension: Secondary | ICD-10-CM | POA: Insufficient documentation

## 2015-02-27 DIAGNOSIS — M79672 Pain in left foot: Secondary | ICD-10-CM

## 2015-02-27 LAB — GLUCOSE, POCT (MANUAL RESULT ENTRY): POC GLUCOSE: 90 mg/dL (ref 70–99)

## 2015-02-27 LAB — POCT GLYCOSYLATED HEMOGLOBIN (HGB A1C): HEMOGLOBIN A1C: 7.2

## 2015-02-27 NOTE — Progress Notes (Signed)
Interpreter line used [place] ID# [id number] Patient here for follow up on her headaches and bilateral leg pain

## 2015-02-27 NOTE — Progress Notes (Signed)
Patient ID: April Mcmahon, female   DOB: 08-Aug-1960, 55 y.o.   MRN: [id number]  CC: F/u  HPI: April Mcmahon is a 55 y.o. female here today for a follow up visit. Patient has past medical history of hypertension, migraine headaches, and diabetes mellitus. Patient reports that she takes her Metformin daily without complications. She denies hypoglycemic events, polyuria/dipsia, blurred vision. She reports some neuropathy of her BLE but has been on gabapentin for pain. Patient is also concerned about bilateral leg pain. This has been a on-going issue for the patient addressed by her past PCP. She is currently on Gabapentin for neuropathy and chronic generalized pain. Cannot stand due to pain or sleep. Reports that she is so weak that she falls over when getting out of bed because she has pain in her heels. Was on a OTC pain med for almost 3 years. Pain in legs only when feet begin to hurt.   No Known Allergies History reviewed. No pertinent past medical history. Current Outpatient Prescriptions on File Prior to Visit  Medication Sig Dispense Refill  . aspirin 325 MG tablet Take 650 mg by mouth every 6 (six) hours as needed. For headache    . fenofibrate 160 MG tablet Take 1 tablet (160 mg total) by mouth daily. For triglyucerides 30 tablet 3  . gabapentin (NEURONTIN) 300 MG capsule Take 1 capsule (300 mg total) by mouth at bedtime. 90 capsule 3  . hydrochlorothiazide (HYDRODIURIL) 12.5 MG tablet Take 1 tablet (12.5 mg total) by mouth daily. 30 tablet 3  . ibuprofen (ADVIL,MOTRIN) 600 MG tablet Take 1 tablet (600 mg total) by mouth every 8 (eight) hours as needed. 30 tablet 3  . metFORMIN (GLUCOPHAGE) 500 MG tablet Take 1 tablet (500 mg total) by mouth 2 (two) times daily with a meal. 180 tablet 3   No current facility-administered medications on file prior to visit.   History reviewed. No pertinent family history. Social History   Social History  . Marital Status: Married    Spouse Name: N/A  .  Number of Children: N/A  . Years of Education: N/A   Occupational History  . Not on file.   Social History Main Topics  . Smoking status: Current Some Day Smoker  . Smokeless tobacco: Not on file  . Alcohol Use: No  . Drug Use: No  . Sexual Activity: Not on file   Other Topics Concern  . Not on file   Social History Narrative    Review of Systems  Constitutional: Negative for malaise/fatigue.  Eyes: Negative for blurred vision.  Musculoskeletal: Positive for joint pain and falls.  Neurological: Positive for headaches.  Endo/Heme/Allergies: Negative for polydipsia.  All other systems reviewed and are negative.  Objective:   Filed Vitals:   02/27/15 1019  BP: 152/86  Pulse: 96  Temp: 98.7 F (37.1 C)  Resp: 16    Physical Exam  Constitutional: She is oriented to person, place, and time.  Cardiovascular: Normal rate, regular rhythm and normal heart sounds.   Pulmonary/Chest: Effort normal and breath sounds normal.  Musculoskeletal: Normal range of motion. She exhibits tenderness (bilateral heel pain).  Neurological: She is alert and oriented to person, place, and time.  Skin: Skin is dry.  Psychiatric: She has a normal mood and affect.     Lab Results  Component Value Date   WBC 7.5 04/19/2013   HGB 13.0 04/19/2013   HCT 40.3 04/19/2013   MCV 79.8 04/19/2013   PLT 191 04/19/2013  Lab Results  Component Value Date   CREATININE 0.62 09/03/2014   BUN 8 09/03/2014   NA 137 09/03/2014   K 4.9 09/03/2014   CL 109 09/03/2014   CO2 23 09/03/2014    Lab Results  Component Value Date   HGBA1C 7.20 02/27/2015   Lipid Panel     Component Value Date/Time   CHOL 165 12/02/2014 1230   TRIG 281* 12/02/2014 1230   HDL 35* 12/02/2014 1230   CHOLHDL 4.7 12/02/2014 1230   VLDL 56* 12/02/2014 1230   LDLCALC 74 12/02/2014 1230       Assessment and plan:   Tekayla was seen today for follow-up.  Diagnoses and all orders for this visit:  Type 2 diabetes  mellitus without complication, without long-term current use of insulin (HCC) -     Glucose (CBG) -     HgB A1c -     Ambulatory referral to Podiatry Patients diabetes is well control as evidence by consistently low a1c.  Patient will continue with current therapy and continue to make necessary lifestyle changes.  Reviewed foot care, diet, exercise, annual health maintenance with patient.   Heel pain, bilateral -     Ambulatory referral to Podiatry I will refer her to podiatry again. Patient likely has plantar fascitis that is affecting her gait upon getting out of bed. Likely has been a problem for so long that it is radiating up her legs. If unable to get patient into podiatry I will try Orthopedics.  Due to language barrier, an interpreter was present during the history-taking and subsequent discussion (and for part of the physical exam) with this patient.  Return in about 3 months (around 05/28/2015) for Diabetes Mellitus.       Lance Bosch, [facility] [phone] 02/27/2015, 10:22 AM

## 2015-03-05 ENCOUNTER — Telehealth: Payer: Self-pay | Admitting: Internal Medicine

## 2015-03-05 DIAGNOSIS — E119 Type 2 diabetes mellitus without complications: Secondary | ICD-10-CM

## 2015-03-05 DIAGNOSIS — E781 Pure hyperglyceridemia: Secondary | ICD-10-CM

## 2015-03-05 DIAGNOSIS — I10 Essential (primary) hypertension: Secondary | ICD-10-CM

## 2015-03-05 DIAGNOSIS — M25561 Pain in right knee: Secondary | ICD-10-CM

## 2015-03-05 MED ORDER — HYDROCHLOROTHIAZIDE 12.5 MG PO TABS: 12.5000 mg | ORAL_TABLET | Freq: Every day | ORAL | Status: DC

## 2015-03-05 MED ORDER — IBUPROFEN 600 MG PO TABS
600.0000 mg | ORAL_TABLET | Freq: Three times a day (TID) | ORAL | Status: DC | PRN
Start: 2015-03-05 — End: 2015-10-10

## 2015-03-05 MED ORDER — GABAPENTIN 300 MG PO CAPS: 300.0000 mg | ORAL_CAPSULE | Freq: Every day | ORAL | Status: DC

## 2015-03-05 MED ORDER — FENOFIBRATE 160 MG PO TABS: 160.0000 mg | ORAL_TABLET | Freq: Every day | ORAL | Status: DC

## 2015-03-05 MED ORDER — METFORMIN HCL 500 MG PO TABS: 500.0000 mg | ORAL_TABLET | Freq: Two times a day (BID) | ORAL | Status: DC

## 2015-03-05 NOTE — Telephone Encounter (Signed)
Pt. Called requesting a refill on all her current medications. Pt. Does not know the name of her medications. Please f/u with pt.

## 2015-03-10 ENCOUNTER — Ambulatory Visit: Payer: Self-pay | Attending: Internal Medicine

## 2015-06-19 ENCOUNTER — Ambulatory Visit: Payer: Self-pay | Attending: Family Medicine | Admitting: Family Medicine

## 2015-06-19 ENCOUNTER — Encounter: Payer: Self-pay | Admitting: Family Medicine

## 2015-06-19 VITALS — BP 153/85 | HR 83 | Temp 98.6°F | Resp 16 | Ht 60.0 in | Wt 149.0 lb

## 2015-06-19 DIAGNOSIS — E1142 Type 2 diabetes mellitus with diabetic polyneuropathy: Secondary | ICD-10-CM | POA: Insufficient documentation

## 2015-06-19 DIAGNOSIS — Z1159 Encounter for screening for other viral diseases: Secondary | ICD-10-CM

## 2015-06-19 DIAGNOSIS — M25562 Pain in left knee: Secondary | ICD-10-CM | POA: Insufficient documentation

## 2015-06-19 DIAGNOSIS — F17200 Nicotine dependence, unspecified, uncomplicated: Secondary | ICD-10-CM | POA: Insufficient documentation

## 2015-06-19 DIAGNOSIS — Z114 Encounter for screening for human immunodeficiency virus [HIV]: Secondary | ICD-10-CM | POA: Insufficient documentation

## 2015-06-19 DIAGNOSIS — Z7984 Long term (current) use of oral hypoglycemic drugs: Secondary | ICD-10-CM | POA: Insufficient documentation

## 2015-06-19 DIAGNOSIS — E119 Type 2 diabetes mellitus without complications: Secondary | ICD-10-CM | POA: Insufficient documentation

## 2015-06-19 DIAGNOSIS — Z7982 Long term (current) use of aspirin: Secondary | ICD-10-CM | POA: Insufficient documentation

## 2015-06-19 DIAGNOSIS — G8929 Other chronic pain: Secondary | ICD-10-CM

## 2015-06-19 DIAGNOSIS — M25561 Pain in right knee: Secondary | ICD-10-CM | POA: Insufficient documentation

## 2015-06-19 DIAGNOSIS — Z79899 Other long term (current) drug therapy: Secondary | ICD-10-CM | POA: Insufficient documentation

## 2015-06-19 DIAGNOSIS — I10 Essential (primary) hypertension: Secondary | ICD-10-CM | POA: Insufficient documentation

## 2015-06-19 DIAGNOSIS — G43109 Migraine with aura, not intractable, without status migrainosus: Secondary | ICD-10-CM | POA: Insufficient documentation

## 2015-06-19 DIAGNOSIS — B1920 Unspecified viral hepatitis C without hepatic coma: Secondary | ICD-10-CM | POA: Insufficient documentation

## 2015-06-19 DIAGNOSIS — Z791 Long term (current) use of non-steroidal anti-inflammatories (NSAID): Secondary | ICD-10-CM | POA: Insufficient documentation

## 2015-06-19 LAB — GLUCOSE, POCT (MANUAL RESULT ENTRY): POC GLUCOSE: 114 mg/dL — AB (ref 70–99)

## 2015-06-19 MED ORDER — SUMATRIPTAN SUCCINATE 100 MG PO TABS: 100.0000 mg | ORAL_TABLET | ORAL | Status: DC | PRN

## 2015-06-19 MED ORDER — METHYLPREDNISOLONE ACETATE 40 MG/ML IJ SUSP
40.0000 mg | Freq: Once | INTRAMUSCULAR | Status: AC
  Administered 2015-06-19: 40 mg via INTRA_ARTICULAR

## 2015-06-19 MED ORDER — HYDROCHLOROTHIAZIDE 25 MG PO TABS: 25.0000 mg | ORAL_TABLET | Freq: Every day | ORAL | Status: DC

## 2015-06-19 NOTE — Patient Instructions (Addendum)
April Mcmahon was seen today for diabetes.  Diagnoses and all orders for this visit:  Controlled type 2 diabetes mellitus without complication, without long-term current use of insulin (HCC) -     POCT glucose (manual entry) -     Hemoglobin A1c -     Microalbumin/Creatinine Ratio, Urine  Essential hypertension -     hydrochlorothiazide (HYDRODIURIL) 25 MG tablet; Take 1 tablet (25 mg total) by mouth daily.  Screening for HIV (human immunodeficiency virus) -     HIV antibody (with reflex)  Need for hepatitis C screening test -     Hepatitis C antibody, reflex  Bilateral chronic knee pain -     methylPREDNISolone acetate (DEPO-MEDROL) injection 40 mg; Inject 1 mL (40 mg total) into the articular space once.  Migraine with aura and without status migrainosus, not intractable -     Ambulatory referral to Neurology -     SUMAtriptan (IMITREX) 100 MG tablet; Take 1 tablet (100 mg total) by mouth every 2 (two) hours as needed for migraine. May repeat in 2 hours if headache persists or recurs.   F/u in 4 weeks for pap smear   Dr. Adrian Blackwater

## 2015-06-19 NOTE — Progress Notes (Signed)
Subjective:  Patient ID: April Mcmahon, female    DOB: Apr 26, 1960  Age: 55 y.o. MRN: [id number]  CC: Diabetes   HPI April Mcmahon presents for    1. CHRONIC DIABETES  Disease Monitoring  Blood Sugar Ranges: not checking   Polyuria: no   Visual problems: no   Medication Compliance: yes  Medication Side Effects  Hypoglycemia: no   Preventitive Health Care  Eye Exam: due   Foot Exam: done today    2. CHRONIC HYPERTENSION  Disease Monitoring  Blood pressure range: not checking   Chest pain: no   Dyspnea: no   Claudication: no   Medication compliance: yes  Medication Side Effects  Lightheadedness: no   Urinary frequency: no   Edema: no   3. Chronic headache: x 30 years. She describes pain at temples and on top of her head. She has shooting pain associated with blurry vision. She also has sensitivity to light. When HA is severe she does vomit and feels better. She reports ringing in ears and stuffy feeling in ears with HA. When she has HA she has numbness in upper back. No other areas of numbness and no weakness. Rest and ibuprofen help relieve HA. HA occurs atbout every 15 days and last for 2-3 days. When she has menstrual bleeding the HA occurs more often. She has been tried on several medicine including Imitex, Topamax and Fioricet in the past. These relieved the pain but the  pain would come back.   4. Bilateral knee pain: x one years. 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 She reports fatigue in knees when she walks. Pain is worse with walking. There is no swelling in knees. No injury to knees. The ibuprofen does help with knee pain when she takes it. She reports that ibuprofen makes her feel tired. R knee is >L knee with 8/10 pain.   Social History  Substance Use Topics  . Smoking status: Current Some Day Smoker  . Smokeless tobacco: Not on file  . Alcohol Use: No    Outpatient Prescriptions Prior to Visit  Medication Sig Dispense Refill  . aspirin 325  MG tablet Take 650 mg by mouth every 6 (six) hours as needed. For headache    . fenofibrate 160 MG tablet Take 1 tablet (160 mg total) by mouth daily. For triglyucerides 30 tablet 3  . gabapentin (NEURONTIN) 300 MG capsule Take 1 capsule (300 mg total) by mouth at bedtime. 90 capsule 3  . hydrochlorothiazide (HYDRODIURIL) 12.5 MG tablet Take 1 tablet (12.5 mg total) by mouth daily. 30 tablet 3  . ibuprofen (ADVIL,MOTRIN) 600 MG tablet Take 1 tablet (600 mg total) by mouth every 8 (eight) hours as needed. 30 tablet 3  . metFORMIN (GLUCOPHAGE) 500 MG tablet Take 1 tablet (500 mg total) by mouth 2 (two) times daily with a meal. 180 tablet 3   No facility-administered medications prior to visit.    ROS Review of Systems  Constitutional: Negative for fever and chills.  Eyes: Negative for visual disturbance.  Respiratory: Negative for shortness of breath.   Cardiovascular: Negative for chest pain.  Gastrointestinal: Negative for abdominal pain and blood in stool.  Genitourinary: Positive for vaginal bleeding and menstrual problem.  Musculoskeletal: Positive for back pain and arthralgias (b/l knee pain x 1 year ).  Skin: Negative for rash.  Allergic/Immunologic: Negative for immunocompromised state.  Neurological: Positive for headaches (x 30 years ).  Hematological: Negative for adenopathy. Does not bruise/bleed easily.  Psychiatric/Behavioral: Negative for suicidal ideas and dysphoric mood.    Objective:  BP 153/85 mmHg  Pulse 83  Temp(Src) 98.6 F (37 C) (Oral)  Resp 16  Ht 5' (1.524 m)  Wt 149 lb (67.586 kg)  BMI 29.10 kg/m2  SpO2 98%  BP/Weight 06/19/2015 02/27/2015 [id number]  Systolic BP 0000000 0000000 [id number]  Diastolic BP 85 86 78  Wt. (Lbs) 149 151.2 148.6  BMI 29.1 29.53 29.02   Physical Exam  Constitutional: She is oriented to person, place, and time. She appears well-developed and well-nourished. No distress.  HENT:  Head: Normocephalic and atraumatic.  Cardiovascular: Normal  rate, regular rhythm, normal heart sounds and intact distal pulses.   Pulmonary/Chest: Effort normal and breath sounds normal.  Musculoskeletal: She exhibits no edema.  Neurological: She is alert and oriented to person, place, and time. She displays normal reflexes. No cranial nerve deficit. She exhibits normal muscle tone. Coordination normal.  Skin: Skin is warm and dry. No rash noted.  Psychiatric: She has a normal mood and affect.   Lab Results  Component Value Date   HGBA1C 7.20 02/27/2015   Lab Results  Component Value Date   HGBA1C 7.3* 06/19/2015    CBG 114  After obtaining informed consent and cleaning the skin using iodine and alcohol a  steroid injection was performed at R knee using 1% plain Lidocaine and 40 mg of Depo Medrol. This was well tolerated  Assessment & Plan:   April Mcmahon was seen today for diabetes.  Diagnoses and all orders for this visit:  Controlled type 2 diabetes mellitus without complication, without long-term current use of insulin (HCC) -     POCT glucose (manual entry) -     Hemoglobin A1c -     Cancel: Microalbumin/Creatinine Ratio, Urine  Essential hypertension -     hydrochlorothiazide (HYDRODIURIL) 25 MG tablet; Take 1 tablet (25 mg total) by mouth daily.  Screening for HIV (human immunodeficiency virus) -     HIV antibody (with reflex)  Need for hepatitis C screening test -     Hepatitis C antibody, reflex  Bilateral chronic knee pain -     methylPREDNISolone acetate (DEPO-MEDROL) injection 40 mg; Inject 1 mL (40 mg total) into the articular space once.  Migraine with aura and without status migrainosus, not intractable -     Ambulatory referral to Neurology -     SUMAtriptan (IMITREX) 100 MG tablet; Take 1 tablet (100 mg total) by mouth every 2 (two) hours as needed for migraine. May repeat in 2 hours if headache persists or recurs.  Type 2 diabetes mellitus without complication, without long-term current use of insulin (HCC) -      metFORMIN (GLUCOPHAGE) 1000 MG tablet; 500 mg after breakfast, 1000 mg after supper by mouth daily  Other orders -     Hepatitis C antibody    No orders of the defined types were placed in this encounter.    Follow-up: No Follow-up on file.   Boykin Nearing MD   screenign HIV anf hep

## 2015-06-19 NOTE — Progress Notes (Signed)
F/U HA,  Knee pain x 3 month  Pain scale # 8 Pain worsen with walk Taking medication as prescribed

## 2015-06-20 DIAGNOSIS — M179 Osteoarthritis of knee, unspecified: Secondary | ICD-10-CM | POA: Insufficient documentation

## 2015-06-20 DIAGNOSIS — M1710 Unilateral primary osteoarthritis, unspecified knee: Secondary | ICD-10-CM | POA: Insufficient documentation

## 2015-06-20 LAB — HEMOGLOBIN A1C
Hgb A1c MFr Bld: 7.3 % — ABNORMAL HIGH (ref <5.7)
Mean Plasma Glucose: 163 mg/dL

## 2015-06-20 LAB — HIV ANTIBODY (ROUTINE TESTING W REFLEX): HIV: NONREACTIVE

## 2015-06-20 LAB — HEPATITIS C ANTIBODY: HCV AB: NEGATIVE

## 2015-06-20 MED ORDER — METFORMIN HCL 1000 MG PO TABS: ORAL_TABLET | ORAL | Status: DC

## 2015-06-20 NOTE — Assessment & Plan Note (Signed)
Remains fairly well controlled Goal A1c is < 7  Increase metformin to 500 mg in AM, 1000 mg in PM

## 2015-06-20 NOTE — Assessment & Plan Note (Signed)
Chronic b/l knee pina R>L Steroid injection in R knee today

## 2015-06-20 NOTE — Assessment & Plan Note (Signed)
Elevated BP Med: compliant Increase HCTZ from 12.5 to 25 mg daily

## 2015-06-20 NOTE — Assessment & Plan Note (Signed)
Chronic migraine She has tried multiple preventative and treatment medications without significant  relief  No red flags on today's exam  Plan: Neuro referral Refilled imitrex

## 2015-07-03 ENCOUNTER — Telehealth: Payer: Self-pay | Admitting: *Deleted

## 2015-07-03 NOTE — Telephone Encounter (Signed)
-----   Message from Boykin Nearing, MD sent at 06/20/2015  1:15 PM EDT ----- Neg screen HIV and hep C A1c up to 7.3, increase metformin from 500 mg in AM, to 500 mg in AM and 1000 mg in PM

## 2015-07-03 NOTE — Telephone Encounter (Signed)
Used [employer] interpreter Nepali [phone] Left massage with female to return call

## 2015-08-12 NOTE — Telephone Encounter (Signed)
Telephone call to pacific interpreter [phone] states unable to receive messages at the following number [phone]. Then interpreter called home# [phone] gentlemen answered /then hung up.  Unable to reach patient letter generated to be sent 08/12/2015

## 2015-09-01 ENCOUNTER — Ambulatory Visit: Payer: Self-pay | Attending: Family Medicine

## 2015-09-05 ENCOUNTER — Encounter (HOSPITAL_COMMUNITY): Payer: Self-pay | Admitting: Emergency Medicine

## 2015-09-05 ENCOUNTER — Ambulatory Visit (HOSPITAL_COMMUNITY)
Admission: EM | Admit: 2015-09-05 | Discharge: 2015-09-05 | Disposition: A | Discharge status: Home / Self Care | Payer: Self-pay | Attending: Emergency Medicine | Admitting: Emergency Medicine

## 2015-09-05 DIAGNOSIS — M7581 Other shoulder lesions, right shoulder: Secondary | ICD-10-CM

## 2015-09-05 DIAGNOSIS — M25511 Pain in right shoulder: Secondary | ICD-10-CM

## 2015-09-05 HISTORY — DX: Essential (primary) hypertension: I10

## 2015-09-05 HISTORY — DX: Type 2 diabetes mellitus without complications: E11.9

## 2015-09-05 MED ORDER — MELOXICAM 7.5 MG PO TABS: 7.5000 mg | ORAL_TABLET | Freq: Every day | ORAL | 0 refills | Status: DC

## 2015-09-05 MED ORDER — TRAMADOL HCL 50 MG PO TABS: 50.0000 mg | ORAL_TABLET | Freq: Four times a day (QID) | ORAL | 0 refills | Status: DC | PRN

## 2015-09-05 MED ORDER — PREDNISONE 50 MG PO TABS: ORAL_TABLET | ORAL | 0 refills | Status: DC

## 2015-09-05 NOTE — Discharge Instructions (Signed)
She has some chronic tendinitis of her shoulder that has flared up. Take prednisone daily for 5 days. Take meloxicam daily for 5 days, then as needed for pain. Use the tramadol every 6-8 hours as needed for severe pain. This medicine may make you drowsy. Follow-up with PCP as needed.

## 2015-09-05 NOTE — ED Provider Notes (Signed)
[place]    CSN: [id number] Arrival date & time: 09/05/15  1255  First Provider Contact:  First MD Initiated Contact with Patient 09/05/15 1400        History   Chief Complaint Chief Complaint  Patient presents with  . Shoulder Pain    HPI April Mcmahon is a 55 y.o. female.   She is a 55 year old woman here for evaluation of right shoulder pain. Her daughter-in-law is present and access interpreter. She has had intermittent problems with the right shoulder since 2010 or 2011. For the last week, she has had worse pain. Pain is located throughout the shoulder, but worse anteriorly. It will radiate into the upper arm at times. Pain is worse with overhead movements and cross body movements. She has tried taking Tylenol without improvement. She has had x-rays as recently as a year ago. These have been normal. Denies any injury or trauma that triggered this event. No change in activity or heavy lifting.    Past Medical History:  Diagnosis Date  . Diabetes mellitus without complication ([place])   . Hypertension     Patient Active Problem List   Diagnosis Date Noted  . Bilateral chronic knee pain 06/20/2015  . Diabetes type 2, controlled ([place]) 06/19/2015  . Essential hypertension 06/19/2015  . Unspecified vitamin D deficiency 09/24/2013  . IFG (impaired fasting glucose) 09/24/2013  . Hypertriglyceridemia 09/24/2013  . Migraine 05/15/2013  . Unspecified essential hypertension 08/18/2012  . Headache(784.0) 08/18/2012  . Low back pain 08/18/2012  . Neck pain 08/18/2012    History reviewed. No pertinent surgical history.  OB History    No data available       Home Medications    Prior to Admission medications   Medication Sig Start Date End Date Taking? Authorizing Provider  aspirin 325 MG tablet Take 650 mg by mouth every 6 (six) hours as needed. For headache    Historical Provider, MD  fenofibrate 160 MG tablet Take 1 tablet (160 mg total) by mouth daily. For  triglyucerides 03/05/15   Lance Bosch, NP  gabapentin (NEURONTIN) 300 MG capsule Take 1 capsule (300 mg total) by mouth at bedtime. 03/05/15   Lance Bosch, NP  hydrochlorothiazide (HYDRODIURIL) 25 MG tablet Take 1 tablet (25 mg total) by mouth daily. 06/19/15   Josalyn Funches, MD  ibuprofen (ADVIL,MOTRIN) 600 MG tablet Take 1 tablet (600 mg total) by mouth every 8 (eight) hours as needed. 03/05/15   Lance Bosch, NP  meloxicam (MOBIC) 7.5 MG tablet Take 1 tablet (7.5 mg total) by mouth daily. 09/05/15   Melony Overly, MD  metFORMIN (GLUCOPHAGE) 1000 MG tablet 500 mg after breakfast, 1000 mg after supper by mouth daily 06/20/15   Boykin Nearing, MD  predniSONE (DELTASONE) 50 MG tablet Take 1 pill daily for 5 days. 09/05/15   Melony Overly, MD  SUMAtriptan (IMITREX) 100 MG tablet Take 1 tablet (100 mg total) by mouth every 2 (two) hours as needed for migraine. May repeat in 2 hours if headache persists or recurs. 06/19/15   Josalyn Funches, MD  traMADol (ULTRAM) 50 MG tablet Take 1 tablet (50 mg total) by mouth every 6 (six) hours as needed. 09/05/15   Melony Overly, MD    Family History No family history on file.  Social History Social History  Substance Use Topics  . Smoking status: Current Some Day Smoker  . Smokeless tobacco: Never Used  . Alcohol use No  Allergies   Review of patient's allergies indicates no known allergies.   Review of Systems Review of Systems  Musculoskeletal:       Right shoulder pain     Physical Exam Triage Vital Signs ED Triage Vitals [09/05/15 1339]  Enc Vitals Group     BP 152/72     Pulse Rate 76     Resp 16     Temp 98.3 F (36.8 C)     Temp Source Oral     SpO2 99 %     Weight      Height      Head Circumference      Peak Flow      Pain Score      Pain Loc      Pain Edu?      Excl. in [place]?    No data found.   Updated Vital Signs BP 152/72 (BP Location: Left Arm)   Pulse 76   Temp 98.3 F (36.8 C) (Oral)   Resp 16   SpO2 99%    Visual Acuity Right Eye Distance:   Left Eye Distance:   Bilateral Distance:    Right Eye Near:   Left Eye Near:    Bilateral Near:     Physical Exam  Constitutional: She is oriented to person, place, and time. She appears well-developed and well-nourished. No distress.  Uncomfortable with shoulder exam  Cardiovascular: Normal rate.   Pulmonary/Chest: Effort normal.  Musculoskeletal:  Right shoulder: No erythema or edema. No obvious asymmetry. She is diffusely tender, but worse so over the [facility] joint and lateral shoulder. Pain with passive abduction beyond 30. Pain with external rotation. Pain with cross body testing.  Neurological: She is alert and oriented to person, place, and time.     UC Treatments / Results  Labs (all labs ordered are listed, but only abnormal results are displayed) Labs Reviewed - No data to display  EKG  EKG Interpretation None       Radiology No results found.  Procedures Procedures (including critical care time)  Medications Ordered in UC Medications - No data to display   Initial Impression / Assessment and Plan / UC Course  I have reviewed the triage vital signs and the nursing notes.  Pertinent labs & imaging results that were available during my care of the patient were reviewed by me and considered in my medical decision making (see chart for details).  Clinical Course    X-ray from April 2016 reviewed and normal. I suspect she has chronic tendinitis and some AC joint arthritis. We'll treat with 5 days of prednisone and meloxicam. Prescription provided for tramadol to use as needed for severe pain. Expect improvement over the weekend. Discussed that this will likely be a recurring issue.  Final Clinical Impressions(s) / UC Diagnoses   Final diagnoses:  Right shoulder pain  Rotator cuff tendonitis, right    New Prescriptions Discharge Medication List as of 09/05/2015  2:12 PM    START taking these medications   Details   meloxicam (MOBIC) 7.5 MG tablet Take 1 tablet (7.5 mg total) by mouth daily., Starting Fri 09/05/2015, Normal    predniSONE (DELTASONE) 50 MG tablet Take 1 pill daily for 5 days., Normal    traMADol (ULTRAM) 50 MG tablet Take 1 tablet (50 mg total) by mouth every 6 (six) hours as needed., Starting Fri 09/05/2015, Normal         Melony Overly, MD 09/05/15 1436

## 2015-09-05 NOTE — ED Notes (Signed)
PT has used tylenol today and says it helps a bit, but not for long

## 2015-09-05 NOTE — ED Triage Notes (Signed)
PT reports shoulder problems since 2011. PT has had imaging done in the past, but no surgeries. Shoulder has been especially painful for the last week

## 2015-10-02 ENCOUNTER — Ambulatory Visit: Payer: Self-pay | Admitting: Family Medicine

## 2015-10-10 ENCOUNTER — Ambulatory Visit: Payer: Self-pay | Attending: Family Medicine | Admitting: Physician Assistant

## 2015-10-10 VITALS — BP 155/78 | HR 86 | Temp 98.1°F | Resp 16 | Wt 150.0 lb

## 2015-10-10 DIAGNOSIS — E781 Pure hyperglyceridemia: Secondary | ICD-10-CM

## 2015-10-10 DIAGNOSIS — E119 Type 2 diabetes mellitus without complications: Secondary | ICD-10-CM

## 2015-10-10 DIAGNOSIS — I10 Essential (primary) hypertension: Secondary | ICD-10-CM

## 2015-10-10 DIAGNOSIS — M25561 Pain in right knee: Secondary | ICD-10-CM | POA: Insufficient documentation

## 2015-10-10 DIAGNOSIS — E1121 Type 2 diabetes mellitus with diabetic nephropathy: Secondary | ICD-10-CM

## 2015-10-10 LAB — POCT GLYCOSYLATED HEMOGLOBIN (HGB A1C): Hemoglobin A1C: 6.6

## 2015-10-10 MED ORDER — MELOXICAM 7.5 MG PO TABS: ORAL_TABLET | ORAL | 2 refills | Status: DC

## 2015-10-10 MED ORDER — GABAPENTIN 300 MG PO CAPS: 300.0000 mg | ORAL_CAPSULE | Freq: Every day | ORAL | 3 refills | Status: DC

## 2015-10-10 MED ORDER — FENOFIBRATE 160 MG PO TABS: 160.0000 mg | ORAL_TABLET | Freq: Every day | ORAL | 3 refills | Status: DC

## 2015-10-10 MED ORDER — HYDROCHLOROTHIAZIDE 25 MG PO TABS: 25.0000 mg | ORAL_TABLET | Freq: Every day | ORAL | 3 refills | Status: DC

## 2015-10-10 MED ORDER — METFORMIN HCL 1000 MG PO TABS: ORAL_TABLET | ORAL | 3 refills | Status: DC

## 2015-10-10 NOTE — Progress Notes (Signed)
April Mcmahon, is a 55 y.o. female  [id number]  [id number]  DOB - Jul 01, 1960  Subjective:  Chief Complaint and HPI: April Mcmahon is a 55 y.o. female here for RF on medications.  She is diabetic, hypertensive and c/o generalized aches and pains all over her whole body.  She has had the aches and pains for years.  No new s/sx.  [employer] used.  She c/o pain in B arms and legs and has occasional headaches.  No neurological s/sx.  She is out of all of her medications for several days.  She asks for a note for disability/being out of work but then tells me she isn't currently working and doesn't need a note to be out of work for now.    Compliant with meds other than running out.  Tries to eat proper diabetic diet.  Does not check sugars regularly.  Denies s/sx hyper/hypoglycemia.   ROS:   Constitutional:  No f/c, No night sweats, No unexplained weight loss. EENT:  No vision changes, No blurry vision, No hearing changes. No mouth, throat, or ear problems.  Respiratory: No cough, No SOB Cardiac: No CP, no palpitations GI:  No abd pain, No N/V/D. GU: No Urinary s/sx Musculoskeletal: generalized aches and pains Neuro: +occasional headache, no dizziness, no motor weakness.  Skin: No rash Endocrine:  No polydipsia. No polyuria.  Psych: Denies SI/HI  Problem  Right Knee Pain    ALLERGIES: No Known Allergies  PAST MEDICAL HISTORY: Past Medical History:  Diagnosis Date  . Diabetes mellitus without complication ([place])   . Hypertension     MEDICATIONS AT HOME: Prior to Admission medications   Medication Sig Start Date End Date Taking? Authorizing Provider  aspirin 325 MG tablet Take 650 mg by mouth every 6 (six) hours as needed. For headache   Yes Historical Provider, MD  fenofibrate 160 MG tablet Take 1 tablet (160 mg total) by mouth daily. For triglyucerides 10/10/15  Yes Argentina Donovan, PA-C  gabapentin (NEURONTIN) 300 MG capsule Take 1 capsule (300 mg total) by mouth  at bedtime. 10/10/15  Yes Dionne Bucy McClung, PA-C  hydrochlorothiazide (HYDRODIURIL) 25 MG tablet Take 1 tablet (25 mg total) by mouth daily. 10/10/15  Yes Argentina Donovan, PA-C  metFORMIN (GLUCOPHAGE) 1000 MG tablet 500 mg after breakfast, 1000 mg after supper by mouth daily 10/10/15  Yes Argentina Donovan, PA-C  meloxicam (MOBIC) 7.5 MG tablet 1 bid prn pain 10/10/15   Argentina Donovan, PA-C  SUMAtriptan (IMITREX) 100 MG tablet Take 1 tablet (100 mg total) by mouth every 2 (two) hours as needed for migraine. May repeat in 2 hours if headache persists or recurs. 06/19/15   Josalyn Funches, MD  traMADol (ULTRAM) 50 MG tablet Take 1 tablet (50 mg total) by mouth every 6 (six) hours as needed. 09/05/15   Melony Overly, MD     Objective:  EXAM:   Vitals:   10/10/15 1345  BP: (!) 155/78  Pulse: 86  Resp: 16  Temp: 98.1 F (36.7 C)  TempSrc: Oral  SpO2: 99%  Weight: 150 lb (68 kg)    General appearance : A&OX3. NAD. Non-toxic-appearing HEENT: Atraumatic and Normocephalic.  PERRLA. EOM intact.  TM clear B. Mouth-MMM, post pharynx WNL w/o erythema, No PND. Neck: supple, no JVD. No cervical lymphadenopathy. No thyromegaly Chest/Lungs:  Breathing-non-labored, Good air entry bilaterally, breath sounds normal without rales, rhonchi, or wheezing  CVS: S1 S2 regular, no murmurs, gallops, rubs  Abdomen: Bowel  sounds present, Non tender and not distended with no gaurding, rigidity or rebound. Extremities: Bilateral Lower Ext shows no edema, both legs are warm to touch with = pulse throughout Neurology:  CN II-XII grossly intact, Non focal.   Psych:  TP linear. J/I WNL. Normal speech. Appropriate eye contact and affect.  Skin:  No Rash  Data Review Lab Results  Component Value Date   HGBA1C 6.6 10/10/2015   HGBA1C 7.3 (H) 06/19/2015   HGBA1C 7.20 02/27/2015     Assessment & Plan  1. Type 2 diabetes mellitus without complication, without long-term current use of insulin (HCC) Improved control -  metFORMIN (GLUCOPHAGE) 1000 MG tablet; 500 mg after breakfast, 1000 mg after supper by mouth daily  Dispense: 270 tablet; Refill: 3 - HgB A1c .   2. Essential hypertension Resume medication(she was out) - hydrochlorothiazide (HYDRODIURIL) 25 MG tablet; Take 1 tablet (25 mg total) by mouth daily.  Dispense: 90 tablet; Refill: 3  3. Right knee pain-generalized aches and pains all over continue- gabapentin (NEURONTIN) 300 MG capsule; Take 1 capsule (300 mg total) by mouth at bedtime.  Dispense: 90 capsule; Refill: 3 - meloxicam (MOBIC) 7.5 MG tablet; 1 bid prn pain  Dispense: 60 tablet; Refill: 2  5. Hypertriglyceridemia - fenofibrate 160 MG tablet; Take 1 tablet (160 mg total) by mouth daily. For triglyucerides  Dispense: 30 tablet; Refill: 3  Patient have been counseled extensively about nutrition and exercise  Return in about 1 week (around 10/17/2015) for  and followup with Funches diabetes/htn/body pain.  The patient was given clear instructions to go to ER or return to medical center if symptoms don't improve, worsen or new problems develop. The patient verbalized understanding. The patient was told to call to get lab results if they haven't heard anything in the next week.     Freeman Caldron, PA-C [facility] and [facility], [place]   10/10/2015, 4:43 PM

## 2015-10-10 NOTE — Progress Notes (Signed)
F/u right shoulder pain from urgent care visit 09/05/15. States medication did help, ran out and did not refill. Also complaining dizziness for 10-11 days while on medication: Meloxicam/tramdol. Been taking Tylenol. [place]

## 2015-10-20 ENCOUNTER — Ambulatory Visit: Payer: Self-pay | Attending: Family Medicine | Admitting: Physician Assistant

## 2015-10-20 ENCOUNTER — Encounter: Payer: Self-pay | Admitting: Physician Assistant

## 2015-10-20 VITALS — BP 152/76 | HR 83 | Temp 98.2°F | Resp 18 | Ht 59.0 in | Wt 151.0 lb

## 2015-10-20 DIAGNOSIS — I10 Essential (primary) hypertension: Secondary | ICD-10-CM | POA: Insufficient documentation

## 2015-10-20 DIAGNOSIS — E785 Hyperlipidemia, unspecified: Secondary | ICD-10-CM | POA: Insufficient documentation

## 2015-10-20 DIAGNOSIS — E1121 Type 2 diabetes mellitus with diabetic nephropathy: Secondary | ICD-10-CM | POA: Insufficient documentation

## 2015-10-20 LAB — CBC WITH DIFFERENTIAL/PLATELET
Basophils Absolute: 0 cells/uL (ref 0–200)
Basophils Relative: 0 %
EOS ABS: 268 {cells}/uL (ref 15–500)
Eosinophils Relative: 4 %
HEMATOCRIT: 38.4 % (ref 35.0–45.0)
Hemoglobin: 12.6 g/dL (ref 11.7–15.5)
LYMPHS PCT: 44 %
Lymphs Abs: 2948 cells/uL (ref 850–3900)
MCH: 26.9 pg — ABNORMAL LOW (ref 27.0–33.0)
MCHC: 32.8 g/dL (ref 32.0–36.0)
MCV: 82.1 fL (ref 80.0–100.0)
MONO ABS: 335 {cells}/uL (ref 200–950)
MPV: 10.9 fL (ref 7.5–12.5)
Monocytes Relative: 5 %
NEUTROS PCT: 47 %
Neutro Abs: 3149 cells/uL (ref 1500–7800)
Platelets: 265 10*3/uL (ref 140–400)
RBC: 4.68 MIL/uL (ref 3.80–5.10)
RDW: 14.3 % (ref 11.0–15.0)
WBC: 6.7 10*3/uL (ref 3.8–10.8)

## 2015-10-20 LAB — GLUCOSE, POCT (MANUAL RESULT ENTRY): POC Glucose: 195 mg/dl — AB (ref 70–99)

## 2015-10-20 MED ORDER — HYDROCHLOROTHIAZIDE 25 MG PO TABS
25.0000 mg | ORAL_TABLET | Freq: Every day | ORAL | 3 refills | Status: DC
Start: 2015-10-20 — End: 2015-12-23

## 2015-10-20 MED ORDER — LISINOPRIL 10 MG PO TABS: 10.0000 mg | ORAL_TABLET | Freq: Every day | ORAL | 1 refills | Status: DC

## 2015-10-20 MED ORDER — HYDROCHLOROTHIAZIDE 25 MG PO TABS: 25.0000 mg | ORAL_TABLET | Freq: Every day | ORAL | 3 refills | Status: DC

## 2015-10-20 MED FILL — LISINOPRIL 10 MG TABLET: 10 | 30 days supply | Qty: 30 | Fill #0

## 2015-10-20 NOTE — Addendum Note (Signed)
Addended by: Trecia Rogers on: 10/20/2015 04:43 PM   Modules accepted: Orders

## 2015-10-20 NOTE — Progress Notes (Signed)
April Mcmahon, is a 55 y.o. female  [id number]  [id number]  DOB - December 07, 1960  Subjective:  Chief Complaint and HPI: April Mcmahon is a 55 y.o. female here today for f/up on diabetes, htn, high lipids, and labs.  She doesn't check her sugars regularly.  Her body aches are improved since her last visit with Meloxicam.   She is taking all her meds as prescribed.  Denies any s/sx of hyper/hypoglycemia.  No complaints today.  [employer] "[place]" used.   ROS:   Constitutional:  No f/c, No night sweats, No unexplained weight loss. EENT:  No vision changes, No blurry vision, No hearing changes. No mouth, throat, or ear problems.  Respiratory: No cough, No SOB Cardiac: No CP, no palpitations GI:  No abd pain, No N/V/D. GU: No Urinary s/sx Musculoskeletal: +body aches-long standing-not new Neuro: No headache, no dizziness, no motor weakness.  Skin: No rash Endocrine:  No polydipsia. No polyuria.  Psych: Denies SI/HI  No problems updated.  ALLERGIES: No Known Allergies  PAST MEDICAL HISTORY: Past Medical History:  Diagnosis Date  . Diabetes mellitus without complication ([place])   . Hypertension     MEDICATIONS AT HOME: Prior to Admission medications   Medication Sig Start Date End Date Taking? Authorizing Provider  aspirin 325 MG tablet Take 650 mg by mouth every 6 (six) hours as needed. For headache    Historical Provider, MD  fenofibrate 160 MG tablet Take 1 tablet (160 mg total) by mouth daily. For triglyucerides 10/10/15   Argentina Donovan, PA-C  gabapentin (NEURONTIN) 300 MG capsule Take 1 capsule (300 mg total) by mouth at bedtime. 10/10/15   Argentina Donovan, PA-C  hydrochlorothiazide (HYDRODIURIL) 25 MG tablet Take 1 tablet (25 mg total) by mouth daily. 10/20/15   Argentina Donovan, PA-C  lisinopril (PRINIVIL,ZESTRIL) 10 MG tablet Take 1 tablet (10 mg total) by mouth daily. 10/20/15   Argentina Donovan, PA-C  meloxicam (MOBIC) 7.5 MG tablet 1 bid prn pain 10/10/15    Argentina Donovan, PA-C  metFORMIN (GLUCOPHAGE) 1000 MG tablet 500 mg after breakfast, 1000 mg after supper by mouth daily 10/10/15   Argentina Donovan, PA-C  SUMAtriptan (IMITREX) 100 MG tablet Take 1 tablet (100 mg total) by mouth every 2 (two) hours as needed for migraine. May repeat in 2 hours if headache persists or recurs. 06/19/15   Josalyn Funches, MD  traMADol (ULTRAM) 50 MG tablet Take 1 tablet (50 mg total) by mouth every 6 (six) hours as needed. 09/05/15   Melony Overly, MD     Objective:  EXAM:   Vitals:   10/20/15 1602  BP: (!) 152/76  Pulse: 83  Resp: 18  Temp: 98.2 F (36.8 C)  TempSrc: Oral  SpO2: 100%  Weight: 151 lb (68.5 kg)  Height: 4\' 11"  (1.499 m)    General appearance : A&OX3. NAD. Non-toxic-appearing HEENT: Atraumatic and Normocephalic.  PERRLA. EOM intact.  TM clear B. Mouth-MMM, post pharynx WNL w/o erythema, No PND. Neck: supple, no JVD. No cervical lymphadenopathy. No thyromegaly Chest/Lungs:  Breathing-non-labored, Good air entry bilaterally, breath sounds normal without rales, rhonchi, or wheezing  CVS: S1 S2 regular, no murmurs, gallops, rubs Extremities: Bilateral Lower Ext shows no edema, both legs are warm to touch with = pulse throughout Neurology:  CN II-XII grossly intact, Non focal.   Psych:  TP linear. J/I WNL. Normal speech. Appropriate eye contact and affect.  Skin:  No Rash  Data Review Lab Results  Component Value Date   HGBA1C 6.6 10/10/2015   HGBA1C 7.3 (H) 06/19/2015   HGBA1C 7.20 02/27/2015     Assessment & Plan   1. Essential hypertension Not controlled.   Add - lisinopril (PRINIVIL,ZESTRIL) 10 MG tablet; Take 1 tablet (10 mg total) by mouth daily.  Dispense: 90 tablet; Refill: 1 - hydrochlorothiazide (HYDRODIURIL) 25 MG tablet; Take 1 tablet (25 mg total) by mouth daily.  Dispense: 90 tablet; Refill: 3 - POCT glucose (manual entry) - Comprehensive metabolic panel - CBC with Differential  2. Controlled type 2 diabetes  mellitus with diabetic nephropathy, without long-term current use of insulin (HCC) Continue current medications.  Not controlled but last A1C=6.6 on 10/10/2015.  Work hard on diabetic diet choices.  - Comprehensive metabolic panel - CBC with Differential  3. Hyperlipidemia Continue fenofibrate.  - Lipid panel   Patient have been counseled extensively about nutrition and exercise  Return in about 3 weeks (around 11/10/2015) for  PCP for htn follow-up. to make sure BP is better controlled after starting Lisinopril  The patient was given clear instructions to go to ER or return to medical center if symptoms don't improve, worsen or new problems develop. The patient verbalized understanding. The patient was told to call to get lab results if they haven't heard anything in the next week.     Freeman Caldron, PA-C [facility] and [facility], [place]   10/20/2015, 4:16 PMPatient ID: April Mcmahon, female   DOB: 03-25-1960, 55 y.o.   MRN: [id number]

## 2015-10-20 NOTE — Progress Notes (Signed)
Patient is here for FU DM HTN  Patient denies pain at this time.  Patient has taken medication today and patient has eaten today.

## 2015-10-21 LAB — COMPREHENSIVE METABOLIC PANEL
ALK PHOS: 95 U/L (ref 33–130)
ALT: 33 U/L — AB (ref 6–29)
AST: 27 U/L (ref 10–35)
Albumin: 4.4 g/dL (ref 3.6–5.1)
BUN: 7 mg/dL (ref 7–25)
CALCIUM: 9.2 mg/dL (ref 8.6–10.4)
CHLORIDE: 104 mmol/L (ref 98–110)
CO2: 25 mmol/L (ref 20–31)
Creat: 0.62 mg/dL (ref 0.50–1.05)
GLUCOSE: 167 mg/dL — AB (ref 65–99)
POTASSIUM: 3.6 mmol/L (ref 3.5–5.3)
Sodium: 137 mmol/L (ref 135–146)
TOTAL PROTEIN: 7.3 g/dL (ref 6.1–8.1)
Total Bilirubin: 0.4 mg/dL (ref 0.2–1.2)

## 2015-10-21 LAB — LIPID PANEL
CHOL/HDL RATIO: 3.3 ratio (ref <=5.0)
CHOLESTEROL: 144 mg/dL (ref 125–200)
HDL: 44 mg/dL — AB (ref >=46)
LDL Cholesterol: 54 mg/dL (ref <130)
Triglycerides: 231 mg/dL — ABNORMAL HIGH (ref <150)
VLDL: 46 mg/dL — AB (ref <30)

## 2015-10-22 ENCOUNTER — Telehealth: Payer: Self-pay | Admitting: *Deleted

## 2015-10-22 MED FILL — HYDROCHLOROTHIAZIDE 25 MG T: 25 | 30 days supply | Qty: 30 | Fill #0

## 2015-10-22 NOTE — Telephone Encounter (Signed)
-----   Message from Argentina Donovan, [place] sent at 10/22/2015  2:26 PM EDT ----- Please call patient and let her know that her labs look overall unchanged.  Her triglycerides are still high and she should continue taking all of her medications.  All of her labs should improve if her she follows a diabetic diet and takes her medications as directed.  Thanks,  Freeman Caldron, PA-C

## 2015-10-22 NOTE — Telephone Encounter (Signed)
Medical Assistant used [employer] to contact patient.  Interpreter Name: Rodman Key Interpreter #: [id number] Patient was not available, [employer] left patient a voicemail. Medical Assistant left message on patient's home and cell voicemail. Voicemail states to give a call back to [place] with [facility] at [phone].

## 2015-12-08 MED FILL — LISINOPRIL 10 MG TABLET: 10 | 30 days supply | Qty: 30 | Fill #1

## 2015-12-08 MED FILL — HYDROCHLOROTHIAZIDE 25 MG T: 25 | 30 days supply | Qty: 30 | Fill #1

## 2015-12-09 MED FILL — ?MELOXICAM 7.5 MG TABLET: 7.5 | 30 days supply | Qty: 60 | Fill #0

## 2015-12-23 ENCOUNTER — Encounter: Payer: Self-pay | Admitting: Family Medicine

## 2015-12-23 ENCOUNTER — Ambulatory Visit: Payer: Self-pay | Attending: Family Medicine | Admitting: Family Medicine

## 2015-12-23 VITALS — BP 168/85 | HR 78 | Temp 97.6°F | Ht 59.0 in | Wt 151.4 lb

## 2015-12-23 DIAGNOSIS — M130 Polyarthritis, unspecified: Secondary | ICD-10-CM | POA: Insufficient documentation

## 2015-12-23 DIAGNOSIS — F17200 Nicotine dependence, unspecified, uncomplicated: Secondary | ICD-10-CM | POA: Insufficient documentation

## 2015-12-23 DIAGNOSIS — M25522 Pain in left elbow: Secondary | ICD-10-CM | POA: Insufficient documentation

## 2015-12-23 DIAGNOSIS — E781 Pure hyperglyceridemia: Secondary | ICD-10-CM | POA: Insufficient documentation

## 2015-12-23 DIAGNOSIS — Z79899 Other long term (current) drug therapy: Secondary | ICD-10-CM | POA: Insufficient documentation

## 2015-12-23 DIAGNOSIS — R51 Headache: Secondary | ICD-10-CM | POA: Insufficient documentation

## 2015-12-23 DIAGNOSIS — M722 Plantar fascial fibromatosis: Secondary | ICD-10-CM | POA: Insufficient documentation

## 2015-12-23 DIAGNOSIS — E1121 Type 2 diabetes mellitus with diabetic nephropathy: Secondary | ICD-10-CM | POA: Insufficient documentation

## 2015-12-23 DIAGNOSIS — E119 Type 2 diabetes mellitus without complications: Secondary | ICD-10-CM

## 2015-12-23 DIAGNOSIS — M25562 Pain in left knee: Secondary | ICD-10-CM | POA: Insufficient documentation

## 2015-12-23 DIAGNOSIS — Z7984 Long term (current) use of oral hypoglycemic drugs: Secondary | ICD-10-CM | POA: Insufficient documentation

## 2015-12-23 DIAGNOSIS — Z7982 Long term (current) use of aspirin: Secondary | ICD-10-CM | POA: Insufficient documentation

## 2015-12-23 DIAGNOSIS — M25511 Pain in right shoulder: Secondary | ICD-10-CM | POA: Insufficient documentation

## 2015-12-23 DIAGNOSIS — M25561 Pain in right knee: Secondary | ICD-10-CM | POA: Insufficient documentation

## 2015-12-23 DIAGNOSIS — I10 Essential (primary) hypertension: Secondary | ICD-10-CM | POA: Insufficient documentation

## 2015-12-23 LAB — URIC ACID: URIC ACID, SERUM: 4.7 mg/dL (ref 2.5–7.0)

## 2015-12-23 LAB — GLUCOSE, POCT (MANUAL RESULT ENTRY): POC GLUCOSE: 174 mg/dL — AB (ref 70–99)

## 2015-12-23 MED ORDER — LISINOPRIL 40 MG PO TABS: 40.0000 mg | ORAL_TABLET | Freq: Every day | ORAL | 11 refills | Status: DC

## 2015-12-23 MED ORDER — TRAMADOL HCL 50 MG PO TABS: 50.0000 mg | ORAL_TABLET | Freq: Two times a day (BID) | ORAL | 3 refills | Status: DC

## 2015-12-23 MED ORDER — METFORMIN HCL 1000 MG PO TABS: ORAL_TABLET | ORAL | 3 refills | Status: DC

## 2015-12-23 MED ORDER — HYDROCHLOROTHIAZIDE 25 MG PO TABS: 25.0000 mg | ORAL_TABLET | Freq: Every day | ORAL | 11 refills | Status: DC

## 2015-12-23 MED ORDER — FENOFIBRATE 160 MG PO TABS: 160.0000 mg | ORAL_TABLET | Freq: Every day | ORAL | 11 refills | Status: DC

## 2015-12-23 MED ORDER — MELOXICAM 7.5 MG PO TABS: 7.5000 mg | ORAL_TABLET | Freq: Every day | ORAL | 5 refills | Status: DC

## 2015-12-23 MED FILL — metFORMIN HCL 1000 MG TABS: 1000 | 30 days supply | Qty: 45 | Fill #0

## 2015-12-23 MED FILL — LISINOPRIL 40 MG TABLET: 40 | 30 days supply | Qty: 30 | Fill #0

## 2015-12-23 MED FILL — FENOFIBRATE 160 MG TABLET: 160 | 30 days supply | Qty: 30 | Fill #0

## 2015-12-23 MED FILL — traMADol HCL 50 MG TABS: 50 | 30 days supply | Qty: 60 | Fill #0

## 2015-12-23 MED FILL — ?MELOXICAM 7.5 MG TABLET: 7.5 | 15 days supply | Qty: 30 | Fill #0

## 2015-12-23 NOTE — Progress Notes (Signed)
Pt is here today to follow up on HTN.

## 2015-12-23 NOTE — Progress Notes (Signed)
Subjective:  Patient ID: April Mcmahon, female    DOB: 1960/03/27  Age: 55 y.o. MRN: [id number]  CC: Hypertension   HPI April Mcmahon presents for    1. CHRONIC DIABETES  Disease Monitoring  Blood Sugar Ranges: not checking   Polyuria: no   Visual problems: no   Medication Compliance: yes  Medication Side Effects  Hypoglycemia: no   Preventitive Health Care  Eye Exam: due   Foot Exam: done today    2. CHRONIC HYPERTENSION  Disease Monitoring  Blood pressure range: not checking   Chest pain: no   Dyspnea: no   Claudication: no   Medication compliance: yes  Medication Side Effects  Lightheadedness: no   Urinary frequency: no   Edema: no   3. Chronic headache: x 30 years. She describes pain at temples and on top of her head. She has shooting pain associated with blurry vision. She also has sensitivity to light. When HA is severe she does vomit and feels better. She reports ringing in ears and stuffy feeling in ears with HA. When she has HA she has numbness in upper back. No other areas of numbness and no weakness. Rest and ibuprofen help relieve HA. HA occurs atbout every 15 days and last for 2-3 days. When she has menstrual bleeding the HA occurs more often. She has been tried on several medicine including Imitex, Topamax and Fioricet in the past. These relieved the pain but the  pain would come back.   4. Bilateral knee pain: x one years. 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 She reports fatigue in knees when she walks. Pain is worse with walking. There is no swelling in knees. No injury to knees. The ibuprofen does help with knee pain when she takes it. She reports that ibuprofen makes her feel tired. R knee is >L knee with 8/10 pain.   5. Joint pain: in R shoulder, L elbow, both knees and both heels. No joint swelling. Has chronic pain since 2011. She has 10/10 pain in her R shoulder.   Social History  Substance Use Topics  . Smoking status: Current Some  Day Smoker  . Smokeless tobacco: Never Used  . Alcohol use No    Outpatient Medications Prior to Visit  Medication Sig Dispense Refill  . aspirin 325 MG tablet Take 650 mg by mouth every 6 (six) hours as needed. For headache    . fenofibrate 160 MG tablet Take 1 tablet (160 mg total) by mouth daily. For triglyucerides 30 tablet 3  . gabapentin (NEURONTIN) 300 MG capsule Take 1 capsule (300 mg total) by mouth at bedtime. 90 capsule 3  . hydrochlorothiazide (HYDRODIURIL) 25 MG tablet Take 1 tablet (25 mg total) by mouth daily. 90 tablet 3  . lisinopril (PRINIVIL,ZESTRIL) 10 MG tablet Take 1 tablet (10 mg total) by mouth daily. 90 tablet 1  . meloxicam (MOBIC) 7.5 MG tablet 1 bid prn pain 60 tablet 2  . metFORMIN (GLUCOPHAGE) 1000 MG tablet 500 mg after breakfast, 1000 mg after supper by mouth daily 270 tablet 3  . SUMAtriptan (IMITREX) 100 MG tablet Take 1 tablet (100 mg total) by mouth every 2 (two) hours as needed for migraine. May repeat in 2 hours if headache persists or recurs. 10 tablet 0  . traMADol (ULTRAM) 50 MG tablet Take 1 tablet (50 mg total) by mouth every 6 (six) hours as needed. 15 tablet 0   No facility-administered medications prior to visit.  ROS Review of Systems  Constitutional: Negative for chills and fever.  Eyes: Negative for visual disturbance.  Respiratory: Negative for shortness of breath.   Cardiovascular: Negative for chest pain.  Gastrointestinal: Negative for abdominal pain and blood in stool.  Genitourinary: Positive for menstrual problem and vaginal bleeding.  Musculoskeletal: Positive for arthralgias (b/l knee pain x 1 year ) and back pain.  Skin: Negative for rash.  Allergic/Immunologic: Negative for immunocompromised state.  Neurological: Positive for headaches (x 30 years ).  Hematological: Negative for adenopathy. Does not bruise/bleed easily.  Psychiatric/Behavioral: Negative for dysphoric mood and suicidal ideas.    Objective:  BP (!)  168/85 (BP Location: Right Arm, Patient Position: Sitting, Cuff Size: Small)   Pulse 78   Temp 97.6 F (36.4 C) (Oral)   Ht 4\' 11"  (1.499 m)   Wt 151 lb 6.4 oz (68.7 kg)   SpO2 99%   BMI 30.58 kg/m   BP/Weight 12/23/2015 [id number]  Systolic BP [id number]  Diastolic BP 85 76 78  Wt. (Lbs) 151.4 151 150  BMI 30.58 30.5 29.29   Physical Exam  Constitutional: She is oriented to person, place, and time. She appears well-developed and well-nourished. No distress.  HENT:  Head: Normocephalic and atraumatic.  Cardiovascular: Normal rate, regular rhythm, normal heart sounds and intact distal pulses.   Pulmonary/Chest: Effort normal and breath sounds normal.  Musculoskeletal: She exhibits no edema.       Feet:  Neurological: She is alert and oriented to person, place, and time. She displays normal reflexes. No cranial nerve deficit. She exhibits normal muscle tone. Coordination normal.  Skin: Skin is warm and dry. No rash noted.  Psychiatric: She has a normal mood and affect.   Lab Results  Component Value Date   HGBA1C 6.6 10/10/2015   CBG 174  Assessment & Plan:   April Mcmahon was seen today for hypertension.  Diagnoses and all orders for this visit:  Controlled type 2 diabetes mellitus with diabetic nephropathy, without long-term current use of insulin (HCC) -     POCT glucose (manual entry) -     lisinopril (PRINIVIL,ZESTRIL) 40 MG tablet; Take 1 tablet (40 mg total) by mouth daily. -     hydrochlorothiazide (HYDRODIURIL) 25 MG tablet; Take 1 tablet (25 mg total) by mouth daily.  Polyarthritis -     Uric Acid -     ANA,IFA RA Diag Pnl w/rflx Tit/Patn -     Vitamin D, 25-hydroxy -     traMADol (ULTRAM) 50 MG tablet; Take 1 tablet (50 mg total) by mouth 2 (two) times daily. -     meloxicam (MOBIC) 7.5 MG tablet; Take 1 tablet (7.5 mg total) by mouth daily. 1 bid prn pain  Hypertriglyceridemia -     fenofibrate 160 MG tablet; Take 1 tablet (160 mg total) by mouth daily.  For triglyucerides  Type 2 diabetes mellitus without complication, without long-term current use of insulin (HCC) -     metFORMIN (GLUCOPHAGE) 1000 MG tablet; 500 mg after breakfast, 1000 mg after supper by mouth daily  Plantar fasciitis, bilateral -     Ambulatory referral to Podiatry    No orders of the defined types were placed in this encounter.   Follow-up: Return in about 4 weeks (around 01/20/2016) for HTN and Diabetes, A1c check .   Boykin Nearing MD   screenign HIV anf hep

## 2015-12-23 NOTE — Patient Instructions (Addendum)
April Mcmahon was seen today for hypertension.  Diagnoses and all orders for this visit:  Controlled type 2 diabetes mellitus with diabetic nephropathy, without long-term current use of insulin (HCC) -     POCT glucose (manual entry) -     lisinopril (PRINIVIL,ZESTRIL) 40 MG tablet; Take 1 tablet (40 mg total) by mouth daily. -     hydrochlorothiazide (HYDRODIURIL) 25 MG tablet; Take 1 tablet (25 mg total) by mouth daily.  Polyarthritis -     Uric Acid -     ANA,IFA RA Diag Pnl w/rflx Tit/Patn -     Vitamin D, 25-hydroxy -     traMADol (ULTRAM) 50 MG tablet; Take 1 tablet (50 mg total) by mouth 2 (two) times daily. -     meloxicam (MOBIC) 7.5 MG tablet; Take 1 tablet (7.5 mg total) by mouth daily. 1 bid prn pain  Hypertriglyceridemia -     fenofibrate 160 MG tablet; Take 1 tablet (160 mg total) by mouth daily. For triglyucerides  Type 2 diabetes mellitus without complication, without long-term current use of insulin (HCC) -     metFORMIN (GLUCOPHAGE) 1000 MG tablet; 500 mg after breakfast, 1000 mg after supper by mouth daily  Plantar fasciitis, bilateral -     Ambulatory referral to Podiatry   F/u in 3-4 weeks for HTN and diabetes   Dr. Adrian Blackwater    Plantar Fasciitis Rehab Ask your health care provider which exercises are safe for you. Do exercises exactly as told by your health care provider and adjust them as directed. It is normal to feel mild stretching, pulling, tightness, or discomfort as you do these exercises, but you should stop right away if you feel sudden pain or your pain gets worse. Do not begin these exercises until told by your health care provider. Stretching and range of motion exercises These exercises warm up your muscles and joints and improve the movement and flexibility of your foot. These exercises also help to relieve pain. Exercise A: Plantar fascia stretch 1. Sit with your left / right leg crossed over your opposite knee. 2. Hold your heel with one hand with that  thumb near your arch. With your other hand, hold your toes and gently pull them back toward the top of your foot. You should feel a stretch on the bottom of your toes or your foot or both. 3. Hold this stretch for__________ seconds. 4. Slowly release your toes and return to the starting position. Repeat __________ times. Complete this exercise __________ times a day. Exercise B: Gastroc, standing 1. Stand with your hands against a wall. 2. Extend your left / right leg behind you, and bend your front knee slightly. 3. Keeping your heels on the floor and keeping your back knee straight, shift your weight toward the wall without arching your back. You should feel a gentle stretch in your left / right calf. 4. Hold this position for __________ seconds. Repeat __________ times. Complete this exercise __________ times a day. Exercise C: Soleus, standing 1. Stand with your hands against a wall. 2. Extend your left / right leg behind you, and bend your front knee slightly. 3. Keeping your heels on the floor, bend your back knee and slightly shift your weight over the back leg. You should feel a gentle stretch deep in your calf. 4. Hold this position for __________ seconds. Repeat __________ times. Complete this exercise __________ times a day. Exercise D: Gastrocsoleus, standing 1. Stand with the ball of your left / right  foot on a step. The ball of your foot is on the walking surface, right under your toes. 2. Keep your other foot firmly on the same step. 3. Hold onto the wall or a railing for balance. 4. Slowly lift your other foot, allowing your body weight to press your heel down over the edge of the step. You should feel a stretch in your left / right calf. 5. Hold this position for __________ seconds. 6. Return both feet to the step. 7. Repeat this exercise with a slight bend in your left / right knee. Repeat __________ times with your left / right knee straight and __________ times with your  left / right knee bent. Complete this exercise __________ times a day. Balance exercise This exercise builds your balance and strength control of your arch to help take pressure off your plantar fascia. Exercise E: Single leg stand 1. Without shoes, stand near a railing or in a doorway. You may hold onto the railing or door frame as needed. 2. Stand on your left / right foot. Keep your big toe down on the floor and try to keep your arch lifted. Do not let your foot roll inward. 3. Hold this position for __________ seconds. 4. If this exercise is too easy, you can try it with your eyes closed or while standing on a pillow. Repeat __________ times. Complete this exercise __________ times a day. This information is not intended to replace advice given to you by your health care provider. Make sure you discuss any questions you have with your health care provider. Document Released: 01/18/2005 Document Revised: 09/23/2015 Document Reviewed: 12/02/2014 Elsevier Interactive Patient Education  2017 [employer].

## 2015-12-24 DIAGNOSIS — M722 Plantar fascial fibromatosis: Secondary | ICD-10-CM | POA: Insufficient documentation

## 2015-12-24 LAB — ANA,IFA RA DIAG PNL W/RFLX TIT/PATN
ANA: NEGATIVE
Rhuematoid fact SerPl-aCnc: <14 IU/mL (ref <14)

## 2015-12-24 LAB — VITAMIN D 25 HYDROXY (VIT D DEFICIENCY, FRACTURES): Vit D, 25-Hydroxy: 18 ng/mL — ABNORMAL LOW (ref 30–100)

## 2015-12-24 NOTE — Assessment & Plan Note (Signed)
Elevated Compliant with meds Increase lisinopril to 40 mg daily Continue HCTZ 25 mg daily

## 2015-12-24 NOTE — Assessment & Plan Note (Signed)
Foot pain consistent with plantar fasciitis Treat with mobic and tramadol Podiatry referral placed

## 2015-12-24 NOTE — Assessment & Plan Note (Signed)
Controlled  Compliant with metfomrin Continue current regimen

## 2015-12-30 ENCOUNTER — Other Ambulatory Visit: Payer: Self-pay | Admitting: Family Medicine

## 2015-12-30 MED ORDER — ERGOCALCIFEROL 1.25 MG (50000 UT) PO CAPS: 50000.0000 [IU] | ORAL_CAPSULE | ORAL | 0 refills | Status: DC

## 2015-12-31 ENCOUNTER — Telehealth: Payer: Self-pay

## 2015-12-31 NOTE — Telephone Encounter (Signed)
Pt was called and informed of lab results and medication being sent to the pharmacy.

## 2016-01-08 ENCOUNTER — Ambulatory Visit: Payer: Self-pay | Admitting: Family Medicine

## 2016-01-20 ENCOUNTER — Ambulatory Visit: Payer: Medicaid Other | Attending: Family Medicine | Admitting: Family Medicine

## 2016-01-20 ENCOUNTER — Encounter: Payer: Self-pay | Admitting: Family Medicine

## 2016-01-20 VITALS — BP 169/98 | HR 89 | Temp 97.6°F | Ht 59.0 in | Wt 150.8 lb

## 2016-01-20 DIAGNOSIS — Z23 Encounter for immunization: Secondary | ICD-10-CM

## 2016-01-20 DIAGNOSIS — F17210 Nicotine dependence, cigarettes, uncomplicated: Secondary | ICD-10-CM | POA: Diagnosis not present

## 2016-01-20 DIAGNOSIS — F17200 Nicotine dependence, unspecified, uncomplicated: Secondary | ICD-10-CM | POA: Insufficient documentation

## 2016-01-20 DIAGNOSIS — E1121 Type 2 diabetes mellitus with diabetic nephropathy: Secondary | ICD-10-CM

## 2016-01-20 DIAGNOSIS — G43109 Migraine with aura, not intractable, without status migrainosus: Secondary | ICD-10-CM | POA: Diagnosis not present

## 2016-01-20 DIAGNOSIS — E559 Vitamin D deficiency, unspecified: Secondary | ICD-10-CM

## 2016-01-20 DIAGNOSIS — E1321 Other specified diabetes mellitus with diabetic nephropathy: Secondary | ICD-10-CM | POA: Diagnosis not present

## 2016-01-20 DIAGNOSIS — E119 Type 2 diabetes mellitus without complications: Secondary | ICD-10-CM

## 2016-01-20 DIAGNOSIS — I10 Essential (primary) hypertension: Secondary | ICD-10-CM | POA: Insufficient documentation

## 2016-01-20 LAB — GLUCOSE, POCT (MANUAL RESULT ENTRY): POC GLUCOSE: 202 mg/dL — AB (ref 70–99)

## 2016-01-20 LAB — POCT GLYCOSYLATED HEMOGLOBIN (HGB A1C): Hemoglobin A1C: 6.9

## 2016-01-20 MED ORDER — PROPRANOLOL HCL 40 MG PO TABS: 40.0000 mg | ORAL_TABLET | Freq: Two times a day (BID) | ORAL | 3 refills | Status: DC

## 2016-01-20 MED ORDER — ERGOCALCIFEROL 1.25 MG (50000 UT) PO CAPS: 50000.0000 [IU] | ORAL_CAPSULE | ORAL | 2 refills | Status: DC

## 2016-01-20 MED ORDER — SUMATRIPTAN SUCCINATE 100 MG PO TABS: 100.0000 mg | ORAL_TABLET | ORAL | 5 refills | Status: DC | PRN

## 2016-01-20 MED ORDER — METFORMIN HCL 1000 MG PO TABS: 1000.0000 mg | ORAL_TABLET | Freq: Two times a day (BID) | ORAL | 11 refills | Status: DC

## 2016-01-20 NOTE — Assessment & Plan Note (Signed)
HTN with migraine Add propranolol 40 mg BID

## 2016-01-20 NOTE — Progress Notes (Signed)
Subjective:  Patient ID: April Mcmahon, female    DOB: 1960/12/21  Age: 55 y.o. MRN: [id number]  CC: Hypertension and Diabetes   HPI April Mcmahon presents for    1. CHRONIC DIABETES  Disease Monitoring  Blood Sugar Ranges: not checking   Polyuria: no   Visual problems: no   Medication Compliance: yes  Medication Side Effects  Hypoglycemia: no   Preventitive Health Care  Eye Exam: due   Foot Exam: done today    2. CHRONIC HYPERTENSION  Disease Monitoring  Blood pressure range: not checking   Chest pain: no   Dyspnea: no   Claudication: no   Medication compliance: yes  Medication Side Effects  Lightheadedness: no   Urinary frequency: no   Edema: no   3. Chronic headache: x 30 years. She describes pain at temples and on top of her head. She has shooting pain associated with blurry vision. She also has sensitivity to light. When HA is severe she does vomit and feels better. She reports ringing in ears and stuffy feeling in ears with HA. When she has HA she has numbness in upper back. No other areas of numbness and no weakness. Rest and ibuprofen help relieve HA. HA occurs atbout every 15 days and last for 2-3 days. When she has menstrual bleeding the HA occurs more often. She has been tried on several medicine including Imitex, Topamax and Fioricet in the past. These relieved the pain but the  pain would come back.   4. Bilateral knee pain: x one years. 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 She reports fatigue in knees when she walks. Pain is worse with walking. There is no swelling in knees. No injury to knees. The ibuprofen does help with knee pain when she takes it. She reports that ibuprofen makes her feel tired. R knee is >L knee with 8/10 pain.   5. Joint pain: in R shoulder, L elbow, both knees and both heels. No joint swelling. Has chronic pain since 2011. She has 10/10 pain in her R shoulder.   Social History  Substance Use Topics  . Smoking status:  Current Some Day Smoker  . Smokeless tobacco: Never Used  . Alcohol use No    Outpatient Medications Prior to Visit  Medication Sig Dispense Refill  . aspirin 325 MG tablet Take 650 mg by mouth every 6 (six) hours as needed. For headache    . ergocalciferol (DRISDOL) 50000 units capsule Take 1 capsule (50,000 Units total) by mouth once a week. 9 capsule 0  . fenofibrate 160 MG tablet Take 1 tablet (160 mg total) by mouth daily. For triglyucerides 30 tablet 11  . gabapentin (NEURONTIN) 300 MG capsule Take 1 capsule (300 mg total) by mouth at bedtime. 90 capsule 3  . hydrochlorothiazide (HYDRODIURIL) 25 MG tablet Take 1 tablet (25 mg total) by mouth daily. 30 tablet 11  . lisinopril (PRINIVIL,ZESTRIL) 40 MG tablet Take 1 tablet (40 mg total) by mouth daily. 30 tablet 11  . meloxicam (MOBIC) 7.5 MG tablet Take 1 tablet (7.5 mg total) by mouth daily. 1 bid prn pain 30 tablet 5  . metFORMIN (GLUCOPHAGE) 1000 MG tablet 500 mg after breakfast, 1000 mg after supper by mouth daily 135 tablet 3  . SUMAtriptan (IMITREX) 100 MG tablet Take 1 tablet (100 mg total) by mouth every 2 (two) hours as needed for migraine. May repeat in 2 hours if headache persists or recurs. 10 tablet 0  .  traMADol (ULTRAM) 50 MG tablet Take 1 tablet (50 mg total) by mouth 2 (two) times daily. 60 tablet 3   No facility-administered medications prior to visit.     ROS Review of Systems  Constitutional: Negative for chills and fever.  Eyes: Negative for visual disturbance.  Respiratory: Positive for shortness of breath.   Cardiovascular: Positive for palpitations. Negative for chest pain.  Gastrointestinal: Negative for abdominal pain and blood in stool.  Genitourinary: Positive for menstrual problem and vaginal bleeding.  Musculoskeletal: Positive for arthralgias (b/l knee pain x 1 year ) and back pain.  Skin: Negative for rash.  Allergic/Immunologic: Negative for immunocompromised state.  Neurological: Positive for  headaches (x 30 years ).  Hematological: Negative for adenopathy. Does not bruise/bleed easily.  Psychiatric/Behavioral: Negative for dysphoric mood and suicidal ideas.    Objective:  BP (!) 169/98 (BP Location: Left Arm, Patient Position: Sitting, Cuff Size: Large)   Pulse 89   Temp 97.6 F (36.4 C) (Oral)   Ht 4\' 11"  (1.499 m)   Wt 150 lb 12.8 oz (68.4 kg)   SpO2 97%   BMI 30.46 kg/m   BP/Weight 01/20/2016 12/23/2015 123XX123  Systolic BP 123XX123 [id number]  Diastolic BP 98 85 76  Wt. (Lbs) 150.8 151.4 151  BMI 30.46 30.58 30.5   Physical Exam  Constitutional: She is oriented to person, place, and time. She appears well-developed and well-nourished. No distress.  HENT:  Head: Normocephalic and atraumatic.  Cardiovascular: Normal rate, regular rhythm, normal heart sounds and intact distal pulses.   Pulmonary/Chest: Effort normal and breath sounds normal.  Musculoskeletal: She exhibits no edema.       Feet:  Neurological: She is alert and oriented to person, place, and time. She displays normal reflexes. No cranial nerve deficit. She exhibits normal muscle tone. Coordination normal.  Skin: Skin is warm and dry. No rash noted.  Psychiatric: She has a normal mood and affect.   Lab Results  Component Value Date   HGBA1C 6.6 10/10/2015   Lab Results  Component Value Date   HGBA1C 6.9 01/20/2016   CBG 209  Assessment & Plan:   April Mcmahon was seen today for hypertension and diabetes.  Diagnoses and all orders for this visit:  Controlled type 2 diabetes mellitus with diabetic nephropathy, without long-term current use of insulin (HCC) -     Glucose (CBG) -     HgB A1c -     Microalbumin/Creatinine Ratio, Urine  Vitamin D deficiency -     ergocalciferol (DRISDOL) 50000 units capsule; Take 1 capsule (50,000 Units total) by mouth once a week.  Essential hypertension -     propranolol (INDERAL) 40 MG tablet; Take 1 tablet (40 mg total) by mouth 2 (two) times daily.  Light  cigarette smoker (1-9 cigarettes per day)  Migraine with aura and without status migrainosus, not intractable -     propranolol (INDERAL) 40 MG tablet; Take 1 tablet (40 mg total) by mouth 2 (two) times daily. -     SUMAtriptan (IMITREX) 100 MG tablet; Take 1 tablet (100 mg total) by mouth every 2 (two) hours as needed for migraine. May repeat in 2 hours if headache persists or recurs.  Type 2 diabetes mellitus without complication, without long-term current use of insulin (HCC) -     metFORMIN (GLUCOPHAGE) 1000 MG tablet; Take 1 tablet (1,000 mg total) by mouth 2 (two) times daily with a meal. 500 mg after breakfast, 1000 mg after supper by mouth daily  No orders of the defined types were placed in this encounter.   Follow-up: Return in about 4 weeks (around 02/17/2016) for HTN and migraines .   Boykin Nearing MD   screenign HIV anf hep

## 2016-01-20 NOTE — Progress Notes (Signed)
Pt is having pain in her right shoulder.

## 2016-01-20 NOTE — Patient Instructions (Addendum)
Kenyon was seen today for hypertension and diabetes.  Diagnoses and all orders for this visit:  Controlled type 2 diabetes mellitus with diabetic nephropathy, without long-term current use of insulin (HCC) -     Glucose (CBG) -     HgB A1c -     Microalbumin/Creatinine Ratio, Urine  Vitamin D deficiency -     ergocalciferol (DRISDOL) 50000 units capsule; Take 1 capsule (50,000 Units total) by mouth once a week.  Essential hypertension -     propranolol (INDERAL) 40 MG tablet; Take 1 tablet (40 mg total) by mouth 2 (two) times daily.  Light cigarette smoker (1-9 cigarettes per day)  Migraine with aura and without status migrainosus, not intractable -     propranolol (INDERAL) 40 MG tablet; Take 1 tablet (40 mg total) by mouth 2 (two) times daily. -     SUMAtriptan (IMITREX) 100 MG tablet; Take 1 tablet (100 mg total) by mouth every 2 (two) hours as needed for migraine. May repeat in 2 hours if headache persists or recurs.  Type 2 diabetes mellitus without complication, without long-term current use of insulin (HCC) -     metFORMIN (GLUCOPHAGE) 1000 MG tablet; Take 1 tablet (1,000 mg total) by mouth 2 (two) times daily with a meal. 500 mg after breakfast, 1000 mg after supper by mouth daily  Smoking cessation support: smoking cessation hotline: 1-800-QUIT-NOW.  Smoking cessation classes are available through [facility] and [facility]. Call ([phone] or visit our website at [email or web address]  14 mg patch for 6 weeks  7 mg patch for 2 weeks   F/u in 4 weeks for HTN and migraines  Dr. Adrian Blackwater

## 2016-01-20 NOTE — Assessment & Plan Note (Signed)
Chronic migraine Add propranolol 40 mg BID

## 2016-01-20 NOTE — Assessment & Plan Note (Signed)
Remains controlled slight decline  Increase metformin to 1000 mg BID

## 2016-01-21 LAB — MICROALBUMIN / CREATININE URINE RATIO
Creatinine, Urine: 63 mg/dL (ref 20–320)
Microalb Creat Ratio: 60 mcg/mg creat — ABNORMAL HIGH (ref <30)
Microalb, Ur: 3.8 mg/dL

## 2016-01-28 ENCOUNTER — Telehealth: Payer: Self-pay

## 2016-01-28 NOTE — Telephone Encounter (Signed)
Pt was called [phone]) and a VM was left informing pt to return phone call for lab results.

## 2016-01-28 NOTE — Progress Notes (Signed)
Pt returned phone call and was informed of lab results. 

## 2016-02-04 MED FILL — ?PROPRANOLOL 40 MG TABLET: 40 | 30 days supply | Qty: 60 | Fill #0

## 2016-02-04 MED FILL — VIT D2 1.25 MG (50,000 UNIT: 1.25 MG | 30 days supply | Qty: 4 | Fill #0

## 2016-02-04 MED FILL — ?METFORMIN HCL 1,000 MG TAB: 1000 | 30 days supply | Qty: 60 | Fill #0

## 2016-02-04 MED FILL — SUMATRIPTAN SUCC 100 MG TAB: 100 | 30 days supply | Qty: 9 | Fill #0 | Status: TO

## 2016-02-04 MED FILL — ?MELOXICAM 7.5 MG TABLET: 7.5 | 15 days supply | Qty: 30 | Fill #1

## 2016-02-12 ENCOUNTER — Encounter (HOSPITAL_COMMUNITY): Payer: Self-pay | Admitting: Emergency Medicine

## 2016-02-12 ENCOUNTER — Ambulatory Visit (HOSPITAL_COMMUNITY)
Admission: EM | Admit: 2016-02-12 | Discharge: 2016-02-12 | Disposition: A | Discharge status: Home / Self Care | Payer: Medicaid Other | Attending: Emergency Medicine | Admitting: Emergency Medicine

## 2016-02-12 DIAGNOSIS — A084 Viral intestinal infection, unspecified: Secondary | ICD-10-CM | POA: Diagnosis not present

## 2016-02-12 MED ORDER — ONDANSETRON HCL 4 MG PO TABS: 4.0000 mg | ORAL_TABLET | Freq: Three times a day (TID) | ORAL | 0 refills | Status: DC | PRN

## 2016-02-12 NOTE — Discharge Instructions (Signed)
You have a stomach bug. Take Zofran every 8 hours as needed for nausea or vomiting. Get lots of rest and drink lots of fluids. This should start to improve in the next 2-3 days. Follow-up as needed.

## 2016-02-12 NOTE — ED Triage Notes (Signed)
Via daugher Melodie Bouillon) interpreter  Here for generalized body aches onset 1200 associated w/vomiting, weakness, HAs, BAs,   Denies fevers, diarrhea  A&O x4... NAD

## 2016-02-12 NOTE — ED Provider Notes (Signed)
[name]    CSN: [id number] Arrival date & time: 02/12/16  1609     History   Chief Complaint Chief Complaint  Patient presents with  . Leg Swelling  . Generalized Body Aches    HPI April Mcmahon is a 56 y.o. female.   HPI  She is a 56 year old woman here for evaluation of vomiting and body aches. Her daughter is present and acts as interpreter. She reports generalized body aches, weakness, and vomiting that started this morning. She denies any diarrhea. No nasal congestion, rhinorrhea, or cough. No fevers. She has been able to tolerate rice and water today. Denies current nausea.  Past Medical History:  Diagnosis Date  . Diabetes mellitus without complication ([place])   . Hypertension     Patient Active Problem List   Diagnosis Date Noted  . Light cigarette smoker (1-9 cigarettes per day) 01/20/2016  . Plantar fasciitis, bilateral 12/24/2015  . Right knee pain 10/10/2015  . Bilateral chronic knee pain 06/20/2015  . Diabetes type 2, controlled ([place]) 06/19/2015  . Essential hypertension 06/19/2015  . Vitamin D deficiency 09/24/2013  . Hypertriglyceridemia 09/24/2013  . Migraine 05/15/2013  . Headache(784.0) 08/18/2012  . Low back pain 08/18/2012  . Neck pain 08/18/2012    History reviewed. No pertinent surgical history.  OB History    No data available       Home Medications    Prior to Admission medications   Medication Sig Start Date End Date Taking? Authorizing Provider  ergocalciferol (DRISDOL) 50000 units capsule Take 1 capsule (50,000 Units total) by mouth once a week. 01/20/16  Yes Josalyn Funches, MD  fenofibrate 160 MG tablet Take 1 tablet (160 mg total) by mouth daily. For triglyucerides 12/23/15  Yes Josalyn Funches, MD  lisinopril (PRINIVIL,ZESTRIL) 40 MG tablet Take 1 tablet (40 mg total) by mouth daily. 12/23/15  Yes Josalyn Funches, MD  meloxicam (MOBIC) 7.5 MG tablet Take 1 tablet (7.5 mg total) by mouth daily. 1 bid prn pain  12/23/15  Yes Josalyn Funches, MD  metFORMIN (GLUCOPHAGE) 1000 MG tablet Take 1 tablet (1,000 mg total) by mouth 2 (two) times daily with a meal. 500 mg after breakfast, 1000 mg after supper by mouth daily 01/20/16  Yes Josalyn Funches, MD  propranolol (INDERAL) 40 MG tablet Take 1 tablet (40 mg total) by mouth 2 (two) times daily. 01/20/16  Yes Josalyn Funches, MD  SUMAtriptan (IMITREX) 100 MG tablet Take 1 tablet (100 mg total) by mouth every 2 (two) hours as needed for migraine. May repeat in 2 hours if headache persists or recurs. 01/20/16  Yes Josalyn Funches, MD  traMADol (ULTRAM) 50 MG tablet Take 1 tablet (50 mg total) by mouth 2 (two) times daily. 12/23/15  Yes Boykin Nearing, MD  aspirin 325 MG tablet Take 650 mg by mouth every 6 (six) hours as needed. For headache    Historical Provider, MD  gabapentin (NEURONTIN) 300 MG capsule Take 1 capsule (300 mg total) by mouth at bedtime. 10/10/15   Argentina Donovan, PA-C  hydrochlorothiazide (HYDRODIURIL) 25 MG tablet Take 1 tablet (25 mg total) by mouth daily. 12/23/15   Josalyn Funches, MD  ondansetron (ZOFRAN) 4 MG tablet Take 1 tablet (4 mg total) by mouth every 8 (eight) hours as needed for nausea or vomiting. 02/12/16   Melony Overly, MD    Family History History reviewed. No pertinent family history.  Social History Social History  Substance Use Topics  . Smoking status: Current Some Day  Smoker  . Smokeless tobacco: Never Used  . Alcohol use No     Allergies   Patient has no known allergies.   Review of Systems Review of Systems As in history of present illness  Physical Exam Triage Vital Signs ED Triage Vitals  Enc Vitals Group     BP 02/12/16 1639 171/71     Pulse Rate 02/12/16 1639 69     Resp 02/12/16 1639 16     Temp 02/12/16 1639 98.6 F (37 C)     Temp Source 02/12/16 1639 Oral     SpO2 02/12/16 1639 100 %     Weight --      Height --      Head Circumference --      Peak Flow --      Pain Score 02/12/16 1642  9     Pain Loc --      Pain Edu? --      Excl. in [place]? --    No data found.   Updated Vital Signs BP 166/77   Pulse 70   Temp 98.6 F (37 C) (Oral)   Resp 18   SpO2 100%   Visual Acuity Right Eye Distance:   Left Eye Distance:   Bilateral Distance:    Right Eye Near:   Left Eye Near:    Bilateral Near:     Physical Exam  Constitutional: She is oriented to person, place, and time. She appears well-developed and well-nourished. No distress.  Neck: Neck supple.  Cardiovascular: Normal rate, regular rhythm and normal heart sounds.   No murmur heard. Pulmonary/Chest: Effort normal and breath sounds normal. No respiratory distress. She has no wheezes. She has no rales.  Abdominal: Soft. Bowel sounds are normal. She exhibits no distension and no mass. There is tenderness (mild diffuse). There is no rebound and no guarding.  Neurological: She is alert and oriented to person, place, and time.     UC Treatments / Results  Labs (all labs ordered are listed, but only abnormal results are displayed) Labs Reviewed - No data to display  EKG  EKG Interpretation None       Radiology No results found.  Procedures Procedures (including critical care time)  Medications Ordered in UC Medications - No data to display   Initial Impression / Assessment and Plan / UC Course  I have reviewed the triage vital signs and the nursing notes.  Pertinent labs & imaging results that were available during my care of the patient were reviewed by me and considered in my medical decision making (see chart for details).  Clinical Course     Orthostatics reviewed and are negative. Symptomatic treatment with Zofran as needed. Discussed importance of fluid intake. Expect improvement over the next 2-3 days. Follow-up as needed.  Final Clinical Impressions(s) / UC Diagnoses   Final diagnoses:  Viral gastroenteritis    New Prescriptions New Prescriptions   ONDANSETRON (ZOFRAN) 4 MG  TABLET    Take 1 tablet (4 mg total) by mouth every 8 (eight) hours as needed for nausea or vomiting.     Melony Overly, MD 02/12/16 1723

## 2016-02-19 ENCOUNTER — Ambulatory Visit: Payer: Self-pay

## 2016-02-21 ENCOUNTER — Encounter (HOSPITAL_COMMUNITY): Payer: Self-pay | Admitting: Nurse Practitioner

## 2016-02-21 ENCOUNTER — Ambulatory Visit (HOSPITAL_COMMUNITY)
Admission: EM | Admit: 2016-02-21 | Discharge: 2016-02-21 | Disposition: A | Discharge status: Nursing Home (Includes ICF) | Payer: Self-pay | Attending: Family Medicine | Admitting: Family Medicine

## 2016-02-21 ENCOUNTER — Emergency Department (HOSPITAL_COMMUNITY)
Admission: EM | Admit: 2016-02-21 | Discharge: 2016-02-21 | Disposition: A | Discharge status: Home / Self Care | Payer: Medicaid Other | Attending: Emergency Medicine | Admitting: Emergency Medicine

## 2016-02-21 ENCOUNTER — Emergency Department (HOSPITAL_COMMUNITY): Payer: Medicaid Other

## 2016-02-21 ENCOUNTER — Encounter (HOSPITAL_COMMUNITY): Payer: Self-pay

## 2016-02-21 DIAGNOSIS — I10 Essential (primary) hypertension: Secondary | ICD-10-CM | POA: Insufficient documentation

## 2016-02-21 DIAGNOSIS — R51 Headache: Secondary | ICD-10-CM | POA: Diagnosis not present

## 2016-02-21 DIAGNOSIS — I169 Hypertensive crisis, unspecified: Secondary | ICD-10-CM

## 2016-02-21 DIAGNOSIS — G43809 Other migraine, not intractable, without status migrainosus: Secondary | ICD-10-CM | POA: Diagnosis not present

## 2016-02-21 DIAGNOSIS — E119 Type 2 diabetes mellitus without complications: Secondary | ICD-10-CM | POA: Diagnosis not present

## 2016-02-21 DIAGNOSIS — Z7982 Long term (current) use of aspirin: Secondary | ICD-10-CM | POA: Insufficient documentation

## 2016-02-21 DIAGNOSIS — F17200 Nicotine dependence, unspecified, uncomplicated: Secondary | ICD-10-CM | POA: Diagnosis not present

## 2016-02-21 DIAGNOSIS — Z7984 Long term (current) use of oral hypoglycemic drugs: Secondary | ICD-10-CM | POA: Insufficient documentation

## 2016-02-21 DIAGNOSIS — R531 Weakness: Secondary | ICD-10-CM | POA: Diagnosis not present

## 2016-02-21 DIAGNOSIS — R519 Headache, unspecified: Secondary | ICD-10-CM

## 2016-02-21 LAB — I-STAT CHEM 8, ED
BUN: 5 mg/dL — AB (ref 6–20)
CALCIUM ION: 1.21 mmol/L (ref 1.15–1.40)
CREATININE: 0.6 mg/dL (ref 0.44–1.00)
Chloride: 107 mmol/L (ref 101–111)
GLUCOSE: 92 mg/dL (ref 65–99)
HCT: 37 % (ref 36.0–46.0)
HEMOGLOBIN: 12.6 g/dL (ref 12.0–15.0)
Potassium: 3.9 mmol/L (ref 3.5–5.1)
Sodium: 143 mmol/L (ref 135–145)
TCO2: 25 mmol/L (ref 0–100)

## 2016-02-21 LAB — COMPREHENSIVE METABOLIC PANEL
ALBUMIN: 4.1 g/dL (ref 3.5–5.0)
ALT: 30 U/L (ref 14–54)
ANION GAP: 9 (ref 5–15)
AST: 28 U/L (ref 15–41)
Alkaline Phosphatase: 115 U/L (ref 38–126)
BUN: 6 mg/dL (ref 6–20)
CO2: 23 mmol/L (ref 22–32)
Calcium: 9.1 mg/dL (ref 8.9–10.3)
Chloride: 106 mmol/L (ref 101–111)
Creatinine, Ser: 0.57 mg/dL (ref 0.44–1.00)
GFR calc Af Amer: >60 mL/min (ref >60)
GFR calc non Af Amer: >60 mL/min (ref >60)
GLUCOSE: 91 mg/dL (ref 65–99)
POTASSIUM: 3.8 mmol/L (ref 3.5–5.1)
SODIUM: 138 mmol/L (ref 135–145)
TOTAL PROTEIN: 7.4 g/dL (ref 6.5–8.1)
Total Bilirubin: 0.4 mg/dL (ref 0.3–1.2)

## 2016-02-21 MED ORDER — SODIUM CHLORIDE 0.9 % IV BOLUS (SEPSIS)
500.0000 mL | Freq: Once | INTRAVENOUS | Status: AC
  Administered 2016-02-21: 500 mL via INTRAVENOUS

## 2016-02-21 MED ORDER — DIPHENHYDRAMINE HCL 50 MG/ML IJ SOLN
12.5000 mg | Freq: Once | INTRAMUSCULAR | Status: AC
  Administered 2016-02-21: 12.5 mg via INTRAVENOUS
  Filled 2016-02-21: qty 1

## 2016-02-21 MED ORDER — PROCHLORPERAZINE EDISYLATE 5 MG/ML IJ SOLN
10.0000 mg | Freq: Once | INTRAMUSCULAR | Status: AC
  Administered 2016-02-21: 10 mg via INTRAVENOUS
  Filled 2016-02-21: qty 2

## 2016-02-21 NOTE — Discharge Instructions (Signed)
Follow-up with your primary care doctor, continue your current medications °

## 2016-02-21 NOTE — ED Triage Notes (Signed)
Patient presents to [facility] with Migraine x2 days, pt has taken Tylenol with no relief

## 2016-02-21 NOTE — ED Triage Notes (Addendum)
Pt presents with c/o headache. The headache began yesterday. The pain is posterior radiating into her neck. Her BP has been elevated at home despite taking her BP medication (propanolol, lisinopril) as prescribed. She went to [facility] for treatment today and they sent her ot ED for further evaluation. she speaks nepali

## 2016-02-21 NOTE — ED Provider Notes (Signed)
[facility] DEPT Provider Note   CSN: [id number] Arrival date & time: 02/21/16  1841     History   Chief Complaint Chief Complaint  Patient presents with  . Hypertension    HPI April Mcmahon is a 56 y.o. female.  HPI Pt presents to the ED for evaluation of a headache.  Pt Has a history of migraines. She has recurrent headaches. She started developing a headache yesterday that was similar to her migraines headaches. She has photophobia and has had nausea. No vomiting. No fevers. No neck pain. He is in the back of her head. Patient went to an urgent care. She was sent down to the emergency room because they noticed that her blood pressure was elevated. sHe does complain of generalized weakness and states she felt faint. Past Medical History:  Diagnosis Date  . Diabetes mellitus without complication ([place])   . Hypertension     Patient Active Problem List   Diagnosis Date Noted  . Light cigarette smoker (1-9 cigarettes per day) 01/20/2016  . Plantar fasciitis, bilateral 12/24/2015  . Right knee pain 10/10/2015  . Bilateral chronic knee pain 06/20/2015  . Diabetes type 2, controlled ([place]) 06/19/2015  . Essential hypertension 06/19/2015  . Vitamin D deficiency 09/24/2013  . Hypertriglyceridemia 09/24/2013  . Migraine 05/15/2013  . Headache(784.0) 08/18/2012  . Low back pain 08/18/2012  . Neck pain 08/18/2012    History reviewed. No pertinent surgical history.  OB History    No data available       Home Medications    Prior to Admission medications   Medication Sig Start Date End Date Taking? Authorizing Provider  aspirin 325 MG tablet Take 650 mg by mouth every 6 (six) hours as needed. For headache    Historical Provider, MD  ergocalciferol (DRISDOL) 50000 units capsule Take 1 capsule (50,000 Units total) by mouth once a week. 01/20/16   Josalyn Funches, MD  fenofibrate 160 MG tablet Take 1 tablet (160 mg total) by mouth daily. For triglyucerides 12/23/15   Boykin Nearing, MD  gabapentin (NEURONTIN) 300 MG capsule Take 1 capsule (300 mg total) by mouth at bedtime. 10/10/15   Argentina Donovan, PA-C  hydrochlorothiazide (HYDRODIURIL) 25 MG tablet Take 1 tablet (25 mg total) by mouth daily. 12/23/15   Josalyn Funches, MD  lisinopril (PRINIVIL,ZESTRIL) 40 MG tablet Take 1 tablet (40 mg total) by mouth daily. 12/23/15   Josalyn Funches, MD  meloxicam (MOBIC) 7.5 MG tablet Take 1 tablet (7.5 mg total) by mouth daily. 1 bid prn pain 12/23/15   Boykin Nearing, MD  metFORMIN (GLUCOPHAGE) 1000 MG tablet Take 1 tablet (1,000 mg total) by mouth 2 (two) times daily with a meal. 500 mg after breakfast, 1000 mg after supper by mouth daily 01/20/16   Boykin Nearing, MD  ondansetron (ZOFRAN) 4 MG tablet Take 1 tablet (4 mg total) by mouth every 8 (eight) hours as needed for nausea or vomiting. 02/12/16   Melony Overly, MD  propranolol (INDERAL) 40 MG tablet Take 1 tablet (40 mg total) by mouth 2 (two) times daily. 01/20/16   Josalyn Funches, MD  SUMAtriptan (IMITREX) 100 MG tablet Take 1 tablet (100 mg total) by mouth every 2 (two) hours as needed for migraine. May repeat in 2 hours if headache persists or recurs. 01/20/16   Josalyn Funches, MD  traMADol (ULTRAM) 50 MG tablet Take 1 tablet (50 mg total) by mouth 2 (two) times daily. 12/23/15   Boykin Nearing, MD    Family  History History reviewed. No pertinent family history.  Social History Social History  Substance Use Topics  . Smoking status: Current Some Day Smoker  . Smokeless tobacco: Never Used  . Alcohol use No     Allergies   Patient has no known allergies.   Review of Systems Review of Systems  All other systems reviewed and are negative.    Physical Exam Updated Vital Signs BP 132/66 (BP Location: Left Arm)   Pulse 65   Temp 97.9 F (36.6 C) (Oral)   Resp 14   SpO2 99%   Physical Exam  Constitutional: She appears well-developed and well-nourished. No distress.  HENT:  Head:  Normocephalic and atraumatic.  Right Ear: External ear normal.  Left Ear: External ear normal.  Eyes: Conjunctivae are normal. Right eye exhibits no discharge. Left eye exhibits no discharge. No scleral icterus.  Neck: Neck supple. No tracheal deviation present.  Cardiovascular: Normal rate, regular rhythm and intact distal pulses.   Pulmonary/Chest: Effort normal and breath sounds normal. No stridor. No respiratory distress. She has no wheezes. She has no rales.  Abdominal: Soft. Bowel sounds are normal. She exhibits no distension. There is no tenderness. There is no rebound and no guarding.  Musculoskeletal: She exhibits no edema or tenderness.  Neurological: She is alert. She has normal strength. No cranial nerve deficit (no facial droop, extraocular movements intact, no slurred speech) or sensory deficit. She exhibits normal muscle tone. She displays no seizure activity. Coordination normal.  pt has weak grip strength bilaterally and weak plantarflexion bilaterally, think this is related to effort. Normal sensation throughout  Skin: Skin is warm and dry. No rash noted.  Psychiatric: She has a normal mood and affect.  Nursing note and vitals reviewed.    ED Treatments / Results  Labs (all labs ordered are listed, but only abnormal results are displayed) Labs Reviewed  I-STAT CHEM 8, ED - Abnormal; Notable for the following:       Result Value   BUN 5 (*)    All other components within normal limits  COMPREHENSIVE METABOLIC PANEL    EKG  EKG Interpretation None       Radiology Ct Head Wo Contrast  Result Date: 02/21/2016 CLINICAL DATA:  Headache, weakness. Hx HTN and diabetes. EXAM: CT HEAD WITHOUT CONTRAST TECHNIQUE: Contiguous axial images were obtained from the base of the skull through the vertex without intravenous contrast. COMPARISON:  07/15/2011 FINDINGS: Brain: No acute intracranial hemorrhage. No focal mass lesion. No CT evidence of acute infarction. No midline shift  or mass effect. No hydrocephalus. Basilar cisterns are patent. Vascular: No hyperdense vessel or unexpected calcification. Skull: Normal. Negative for fracture or focal lesion. Sinuses/Orbits: Paranasal sinuses and mastoid air cells are clear. Orbits are clear. Other: None. IMPRESSION: Normal head CT. Electronically Signed   By: Suzy Bouchard M.D.   On: 02/21/2016 21:04    Procedures Procedures (including critical care time)  Medications Ordered in ED Medications  prochlorperazine (COMPAZINE) injection 10 mg (10 mg Intravenous Given 02/21/16 2109)  diphenhydrAMINE (BENADRYL) injection 12.5 mg (12.5 mg Intravenous Given 02/21/16 2109)  sodium chloride 0.9 % bolus 500 mL (0 mLs Intravenous Stopped 02/21/16 2231)     Initial Impression / Assessment and Plan / ED Course  I have reviewed the triage vital signs and the nursing notes.  Pertinent labs & imaging results that were available during my care of the patient were reviewed by me and considered in my medical decision making (see chart for  details).  Clinical Course as of Feb 21 2235  Sat Feb 21, 2016  2237 Feeling better.  Ready to go home  [JK]    Clinical Course User Index [JK] Dorie Rank, MD   The patient's blood pressure improved in the emergency room without any medications.  Most recent blood pressure was 132/66. I doubt hypertensive emergency or hypertensive urgency.  I suspect her blood pressure was elevated because of the pain she is experiencing from her headache.  CT scan did not show any acute abnormalities. Patient improved after migraine cocktail.  At this time there does not appear to be any evidence of an acute emergency medical condition and the patient appears stable for discharge with appropriate outpatient follow up.  Final Clinical Impressions(s) / ED Diagnoses   Final diagnoses:  Hypertension, unspecified type  Other migraine without status migrainosus, not intractable    New Prescriptions New Prescriptions     No medications on file     Dorie Rank, MD 02/21/16 2237

## 2016-02-21 NOTE — ED Provider Notes (Signed)
CSN: [id number]     Arrival date & time 02/21/16  1614 History   First MD Initiated Contact with Patient 02/21/16 1811     Chief Complaint  Patient presents with  . Migraine   (Consider location/radiation/quality/duration/timing/severity/associated sxs/prior Treatment) Please see below. Patient denies chest pain. Patient reports that she passed out 3 times today. She rates her headache currently at over 10 out of 10. She also endorses weakness.  Blood pressures 204/89.    Headache  Pain location:  Generalized Quality: throbbing. Radiates to:  L neck and R neck Pain severity now: More than 10/10. Onset quality:  Sudden Duration:  2 days Timing:  Constant Progression:  Worsening Chronicity:  New Similar to prior headaches: yes (last prior similar headache was 20 years ago)   Context: bright light and loud noise   Context comment:  +nauseous Associated symptoms: back pain, neck pain, photophobia, syncope and weakness   Associated symptoms: no abdominal pain, no blurred vision, no cough and no vomiting   Associated symptoms comment:  Had three episodes of syncope episode today. Denies Chest Pain   Past Medical History:  Diagnosis Date  . Diabetes mellitus without complication ([place])   . Hypertension    History reviewed. No pertinent surgical history. History reviewed. No pertinent family history. Social History  Substance Use Topics  . Smoking status: Current Some Day Smoker  . Smokeless tobacco: Never Used  . Alcohol use No   OB History    No data available     Review of Systems  Constitutional:       As stated in the HPI  Eyes: Positive for photophobia. Negative for blurred vision.  Respiratory: Negative for cough and shortness of breath.   Cardiovascular: Positive for syncope. Negative for chest pain and palpitations.  Gastrointestinal: Negative for abdominal pain and vomiting.  Musculoskeletal: Positive for back pain and neck pain.  Neurological: Positive for  weakness and headaches.    Allergies  Patient has no known allergies.  Home Medications   Prior to Admission medications   Medication Sig Start Date End Date Taking? Authorizing Provider  aspirin 325 MG tablet Take 650 mg by mouth every 6 (six) hours as needed. For headache   Yes Historical Provider, MD  ergocalciferol (DRISDOL) 50000 units capsule Take 1 capsule (50,000 Units total) by mouth once a week. 01/20/16  Yes Josalyn Funches, MD  fenofibrate 160 MG tablet Take 1 tablet (160 mg total) by mouth daily. For triglyucerides 12/23/15  Yes Josalyn Funches, MD  gabapentin (NEURONTIN) 300 MG capsule Take 1 capsule (300 mg total) by mouth at bedtime. 10/10/15  Yes Dionne Bucy McClung, PA-C  hydrochlorothiazide (HYDRODIURIL) 25 MG tablet Take 1 tablet (25 mg total) by mouth daily. 12/23/15  Yes Josalyn Funches, MD  lisinopril (PRINIVIL,ZESTRIL) 40 MG tablet Take 1 tablet (40 mg total) by mouth daily. 12/23/15  Yes Josalyn Funches, MD  meloxicam (MOBIC) 7.5 MG tablet Take 1 tablet (7.5 mg total) by mouth daily. 1 bid prn pain 12/23/15  Yes Josalyn Funches, MD  metFORMIN (GLUCOPHAGE) 1000 MG tablet Take 1 tablet (1,000 mg total) by mouth 2 (two) times daily with a meal. 500 mg after breakfast, 1000 mg after supper by mouth daily 01/20/16  Yes Josalyn Funches, MD  ondansetron (ZOFRAN) 4 MG tablet Take 1 tablet (4 mg total) by mouth every 8 (eight) hours as needed for nausea or vomiting. 02/12/16  Yes Melony Overly, MD  propranolol (INDERAL) 40 MG tablet Take 1 tablet (40  mg total) by mouth 2 (two) times daily. 01/20/16  Yes Josalyn Funches, MD  SUMAtriptan (IMITREX) 100 MG tablet Take 1 tablet (100 mg total) by mouth every 2 (two) hours as needed for migraine. May repeat in 2 hours if headache persists or recurs. 01/20/16  Yes Josalyn Funches, MD  traMADol (ULTRAM) 50 MG tablet Take 1 tablet (50 mg total) by mouth 2 (two) times daily. 12/23/15  Yes Boykin Nearing, MD   Meds Ordered and Administered this  Visit  Medications - No data to display  BP (!) 204/89 (BP Location: Right Arm)   Pulse 60   Temp 98.3 F (36.8 C) (Oral)   Resp 16   SpO2 100%  No data found.   Physical Exam  Constitutional: She is oriented to person, place, and time.  No acute distress but does appears weak.  HENT:  Head: Normocephalic.  Eyes: Pupils are equal, round, and reactive to light.  Neck: Normal range of motion.  Cardiovascular: Normal rate, regular rhythm and normal heart sounds.   Pulmonary/Chest: Effort normal and breath sounds normal.  Abdominal: Soft. Bowel sounds are normal. There is no tenderness.  Neurological: She is alert and oriented to person, place, and time. No cranial nerve deficit.  Has generalized weakness noted on bilateral arms and bilateral legs. No unifocal weakness noted.   Skin: Skin is warm and dry.  Nursing note and vitals reviewed.   Urgent Care Course     Procedures (including critical care time)  Labs Review Labs Reviewed - No data to display  Imaging Review No results found.  MDM   1. Hypertensive crisis   2. Severe headache   3. Weakness    Patient presents today with a severe headache 2 days accompanied by weakness. Blood pressures also noted to be very elevated. She also reports that she passed out 3 times today. These symptoms are concerning, patient transferred via shuttle to the emergency department for further evaluation.     Barry Dienes, NP 02/21/16 [phone]

## 2016-02-21 NOTE — ED Notes (Signed)
Reported to first nurse [facility]

## 2016-03-05 ENCOUNTER — Ambulatory Visit: Payer: Medicaid Other | Attending: Family Medicine | Admitting: Family Medicine

## 2016-03-05 VITALS — BP 162/89 | HR 81 | Temp 97.5°F | Ht 59.0 in | Wt 151.4 lb

## 2016-03-05 DIAGNOSIS — M545 Low back pain: Secondary | ICD-10-CM | POA: Insufficient documentation

## 2016-03-05 DIAGNOSIS — Z5189 Encounter for other specified aftercare: Secondary | ICD-10-CM | POA: Insufficient documentation

## 2016-03-05 DIAGNOSIS — Z79899 Other long term (current) drug therapy: Secondary | ICD-10-CM | POA: Diagnosis not present

## 2016-03-05 DIAGNOSIS — Z7984 Long term (current) use of oral hypoglycemic drugs: Secondary | ICD-10-CM | POA: Diagnosis not present

## 2016-03-05 DIAGNOSIS — F17200 Nicotine dependence, unspecified, uncomplicated: Secondary | ICD-10-CM | POA: Diagnosis not present

## 2016-03-05 DIAGNOSIS — M25561 Pain in right knee: Secondary | ICD-10-CM | POA: Insufficient documentation

## 2016-03-05 DIAGNOSIS — G8929 Other chronic pain: Secondary | ICD-10-CM | POA: Diagnosis not present

## 2016-03-05 DIAGNOSIS — G43109 Migraine with aura, not intractable, without status migrainosus: Secondary | ICD-10-CM | POA: Insufficient documentation

## 2016-03-05 DIAGNOSIS — Z7982 Long term (current) use of aspirin: Secondary | ICD-10-CM | POA: Diagnosis not present

## 2016-03-05 DIAGNOSIS — E1121 Type 2 diabetes mellitus with diabetic nephropathy: Secondary | ICD-10-CM | POA: Diagnosis not present

## 2016-03-05 DIAGNOSIS — M542 Cervicalgia: Secondary | ICD-10-CM | POA: Insufficient documentation

## 2016-03-05 DIAGNOSIS — I10 Essential (primary) hypertension: Secondary | ICD-10-CM | POA: Diagnosis not present

## 2016-03-05 LAB — GLUCOSE, POCT (MANUAL RESULT ENTRY): POC GLUCOSE: 205 mg/dL — AB (ref 70–99)

## 2016-03-05 MED ORDER — AMLODIPINE BESYLATE 10 MG PO TABS: 10.0000 mg | ORAL_TABLET | Freq: Every day | ORAL | 11 refills | Status: DC

## 2016-03-05 MED ORDER — CLONIDINE HCL 0.1 MG PO TABS
0.1000 mg | ORAL_TABLET | Freq: Once | ORAL | Status: AC
  Administered 2016-03-05: 0.1 mg via ORAL

## 2016-03-05 MED ORDER — AMITRIPTYLINE HCL 50 MG PO TABS: 50.0000 mg | ORAL_TABLET | Freq: Every day | ORAL | 2 refills | Status: DC

## 2016-03-05 MED FILL — ?AMITRIPTYLINE HCL 50MG TAB: 50 | 30 days supply | Qty: 30 | Fill #0 | Status: TO

## 2016-03-05 MED FILL — AMLODIPINE BESYLATE 10 MG T: 10 | 30 days supply | Qty: 30 | Fill #0

## 2016-03-05 MED FILL — LISINOPRIL 40 MG TABLET: 40 | 30 days supply | Qty: 30 | Fill #1

## 2016-03-05 MED FILL — ?METFORMIN HCL 1,000 MG TAB: 1000 | 20 days supply | Qty: 60 | Fill #1

## 2016-03-05 NOTE — Progress Notes (Signed)
Pt is having pain in her knees, feet, shoulders, and elbow. Pain level is 10.

## 2016-03-05 NOTE — Patient Instructions (Addendum)
Trinh was seen today for hospitalization follow-up.  Diagnoses and all orders for this visit:  Controlled type 2 diabetes mellitus with diabetic nephropathy, without long-term current use of insulin (HCC) -     POCT glucose (manual entry)  Essential hypertension -     amLODipine (NORVASC) 10 MG tablet; Take 1 tablet (10 mg total) by mouth daily. -     cloNIDine (CATAPRES) tablet 0.1 mg; Take 1 tablet (0.1 mg total) by mouth once.  Chronic bilateral low back pain, with sciatica presence unspecified  Chronic pain of right knee  Neck pain  Migraine with aura and without status migrainosus, not intractable -     amitriptyline (ELAVIL) 50 MG tablet; Take 1 tablet (50 mg total) by mouth at bedtime.   Stop propranolol for HTN and migraines  norvasc and lisinopril for high blood pressure Elavil for body aches, headache and trouble sleeping  Restart metformin for blood sugar as high sugar will make you urinate at night  F/u in 2 weeks with Erline Levine for BP check  Fu with me in 4 weeks for headache, HTN   For headache and chronic body aches

## 2016-03-05 NOTE — Progress Notes (Signed)
Subjective:  Patient ID: April Mcmahon, female    DOB: 12-05-1960  Age: 56 y.o. MRN: [id number] Nepali interpreter April Mcmahon ID # [phone] CC: Hospitalization Follow-up   HPI April Mcmahon presents for   1. ED f/u: she was seen in ED on 02/21/2016 with severe HTN and  headache. She was hypertensive. CT head was normal. She continues to have headache. She is taking tylenol without relief. She bring her medications with her today. She is out of lisinopril and metformin. She does not have HCTZ. She takes propranolol for HTN and migraines but reports feeling itching and weak.   Social History  Substance Use Topics  . Smoking status: Current Some Day Smoker  . Smokeless tobacco: Never Used  . Alcohol use No    Outpatient Medications Prior to Visit  Medication Sig Dispense Refill  . ergocalciferol (DRISDOL) 50000 units capsule Take 1 capsule (50,000 Units total) by mouth once a week. 4 capsule 2  . fenofibrate 160 MG tablet Take 1 tablet (160 mg total) by mouth daily. For triglyucerides 30 tablet 11  . lisinopril (PRINIVIL,ZESTRIL) 40 MG tablet Take 1 tablet (40 mg total) by mouth daily. 30 tablet 11  . meloxicam (MOBIC) 7.5 MG tablet Take 1 tablet (7.5 mg total) by mouth daily. 1 bid prn pain 30 tablet 5  . metFORMIN (GLUCOPHAGE) 1000 MG tablet Take 1 tablet (1,000 mg total) by mouth 2 (two) times daily with a meal. 500 mg after breakfast, 1000 mg after supper by mouth daily 60 tablet 11  . propranolol (INDERAL) 40 MG tablet Take 1 tablet (40 mg total) by mouth 2 (two) times daily. 60 tablet 3  . traMADol (ULTRAM) 50 MG tablet Take 1 tablet (50 mg total) by mouth 2 (two) times daily. 60 tablet 3  . aspirin 325 MG tablet Take 650 mg by mouth every 6 (six) hours as needed. For headache    . gabapentin (NEURONTIN) 300 MG capsule Take 1 capsule (300 mg total) by mouth at bedtime. (Patient not taking: Reported on 03/05/2016) 90 capsule 3  . hydrochlorothiazide (HYDRODIURIL) 25 MG tablet Take 1 tablet (25 mg  total) by mouth daily. (Patient not taking: Reported on 03/05/2016) 30 tablet 11  . ondansetron (ZOFRAN) 4 MG tablet Take 1 tablet (4 mg total) by mouth every 8 (eight) hours as needed for nausea or vomiting. (Patient not taking: Reported on 03/05/2016) 20 tablet 0  . SUMAtriptan (IMITREX) 100 MG tablet Take 1 tablet (100 mg total) by mouth every 2 (two) hours as needed for migraine. May repeat in 2 hours if headache persists or recurs. (Patient not taking: Reported on 03/05/2016) 10 tablet 5   No facility-administered medications prior to visit.     ROS Review of Systems  Constitutional: Negative for chills and fever.  Eyes: Negative for visual disturbance.  Respiratory: Negative for shortness of breath.   Cardiovascular: Negative for chest pain and palpitations.  Gastrointestinal: Negative for abdominal pain and blood in stool.  Genitourinary: Negative for menstrual problem and vaginal bleeding.  Musculoskeletal: Positive for arthralgias. Negative for back pain.  Skin: Negative for rash.  Allergic/Immunologic: Negative for immunocompromised state.  Neurological: Positive for headaches (x 30 years ).  Hematological: Negative for adenopathy. Does not bruise/bleed easily.  Psychiatric/Behavioral: Positive for sleep disturbance. Negative for dysphoric mood and suicidal ideas.    Objective:  BP (!) 172/81 (BP Location: Left Arm, Patient Position: Sitting, Cuff Size: Small)   Pulse 81   Temp 97.5 F (36.4 C) (Oral)  Ht 4\' 11"  (1.499 m)   Wt 151 lb 6.4 oz (68.7 kg)   SpO2 99%   BMI 30.58 kg/m   BP/Weight 03/05/2016 02/21/2016 [id number]  Systolic BP Q000111Q Q000111Q 0000000  Diastolic BP 81 66 89  Wt. (Lbs) 151.4 - -  BMI 30.58 - -    Physical Exam  Constitutional: She is oriented to person, place, and time. She appears well-developed and well-nourished. No distress.  HENT:  Head: Normocephalic and atraumatic.  Cardiovascular: Normal rate, regular rhythm, normal heart sounds and intact distal  pulses.   Pulmonary/Chest: Effort normal and breath sounds normal.  Musculoskeletal: She exhibits no edema.  Neurological: She is alert and oriented to person, place, and time. She displays normal reflexes. No cranial nerve deficit. She exhibits normal muscle tone. Coordination normal.  Skin: Skin is warm and dry. No rash noted.  Psychiatric: She has a normal mood and affect.   Lab Results  Component Value Date   HGBA1C 6.9 01/20/2016   CBG 205  Treated with clonidine 0.1 mg PO x one   Repeat BP 162/89  Assessment & Plan:   [name] was seen today for hospitalization follow-up.  Diagnoses and all orders for this visit:  Controlled type 2 diabetes mellitus with diabetic nephropathy, without long-term current use of insulin (HCC) -     POCT glucose (manual entry)  Essential hypertension -     amLODipine (NORVASC) 10 MG tablet; Take 1 tablet (10 mg total) by mouth daily. -     cloNIDine (CATAPRES) tablet 0.1 mg; Take 1 tablet (0.1 mg total) by mouth once.  Chronic bilateral low back pain, with sciatica presence unspecified  Chronic pain of right knee  Neck pain  Migraine with aura and without status migrainosus, not intractable -     amitriptyline (ELAVIL) 50 MG tablet; Take 1 tablet (50 mg total) by mouth at bedtime.    No orders of the defined types were placed in this encounter.   Follow-up: Return in about 2 weeks (around 03/19/2016) for HTN .   Boykin Nearing MD

## 2016-03-07 ENCOUNTER — Encounter: Payer: Self-pay | Admitting: Family Medicine

## 2016-03-07 NOTE — Assessment & Plan Note (Addendum)
Chronic migraine without improvement with propranolol and with insomnia Plan: Start elavil 50 mg nightly

## 2016-03-07 NOTE — Assessment & Plan Note (Signed)
Uncontrolled HTN with chronic headahce Plan: Stop propanolol Add norvasc 10 mg daily Start prescribed HCTZ 25 mg daily Refilled and continue lisinopril 40 mg daily

## 2016-03-08 ENCOUNTER — Encounter: Payer: Self-pay | Admitting: Family Medicine

## 2016-03-08 MED FILL — VIT D2 1.25 MG (50,000 UNIT: 1.25 MG | 30 days supply | Qty: 4 | Fill #1

## 2016-03-10 ENCOUNTER — Telehealth: Payer: Self-pay | Admitting: Family Medicine

## 2016-03-10 NOTE — Telephone Encounter (Signed)
Patient called the office returning nurse's call. Please follow up.  Thank you.

## 2016-03-16 NOTE — Telephone Encounter (Signed)
Please inform patient that Personal Care Service require medicaid Patient does not have medicaid and therefore does not qualify

## 2016-03-17 NOTE — Telephone Encounter (Signed)
Pt was called and there is no VM set up at this time to leave a message.

## 2016-03-18 ENCOUNTER — Ambulatory Visit: Payer: Medicaid Other | Attending: Family Medicine | Admitting: Pharmacist

## 2016-03-18 VITALS — BP 148/85 | HR 92

## 2016-03-18 DIAGNOSIS — I10 Essential (primary) hypertension: Secondary | ICD-10-CM

## 2016-03-18 DIAGNOSIS — Z79899 Other long term (current) drug therapy: Secondary | ICD-10-CM | POA: Diagnosis not present

## 2016-03-18 MED ORDER — HYDROCHLOROTHIAZIDE 12.5 MG PO TABS: 12.5000 mg | ORAL_TABLET | Freq: Every day | ORAL | 2 refills | Status: DC

## 2016-03-18 NOTE — Progress Notes (Signed)
   S:    Patient arrives in good spirits.    Presents to the clinic for hypertension evaluation. Patient was referred on 03/05/16 by Dr. Adrian Blackwater.  Patient was last seen by Primary Care Provider on 03/05/16.   Patient denies adherence with medications. She stopped the amlodipine because it caused dizziness, vomiting, and migraine.  Current BP Medications include:  Amlodipine 10 mg daily (stopped), HCTZ 25 mg (never picked up), lisinopril 40 mg daily.    O:   Last 3 Office BP readings: BP Readings from Last 3 Encounters:  03/18/16 (!) 148/85  03/05/16 (!) 162/89  02/21/16 132/66    BMET    Component Value Date/Time   NA 143 02/21/2016 2125   K 3.9 02/21/2016 2125   CL 107 02/21/2016 2125   CO2 23 02/21/2016 2100   GLUCOSE 92 02/21/2016 2125   BUN 5 (L) 02/21/2016 2125   CREATININE 0.60 02/21/2016 2125   CREATININE 0.62 10/20/2015 1625   CALCIUM 9.1 02/21/2016 2100   GFRNONAA >60 02/21/2016 2100   GFRNONAA >89 09/03/2014 1219   GFRAA >60 02/21/2016 2100   GFRAA >89 09/03/2014 1219    A/P: Hypertension longstanding currently UNcontrolled on current medications.  Continued lisinopril 40 daily. Hold the amlodipine 10 mg daily for now but would consider restarting at a lower dose. Ordered HCTZ 12.5 mg for now (not on med list) since patient seems to be very sensitive but plan to titrate to 25 mg at next visit.   Results reviewed and written information provided.   Total time in face-to-face counseling 20 minutes.   F/U Clinic Visit with me in 1 week.  Patient seen with Windell Moulding, PharmD Candidate

## 2016-03-18 NOTE — Patient Instructions (Signed)
Thanks for coming to see [name]  Start hydrochlorothiazide  Stop the amlodipine for now  Give the amitriptyline a few weeks to help  Schedule appt with Diane for financial counseling

## 2016-04-01 ENCOUNTER — Ambulatory Visit: Payer: Self-pay | Attending: Family Medicine

## 2016-04-05 MED FILL — LISINOPRIL 40 MG TABLET: 40 | 30 days supply | Qty: 30 | Fill #2

## 2016-04-08 MED FILL — VIT D2 1.25 MG (50,000 UNIT: 1.25 MG | 30 days supply | Qty: 4 | Fill #2

## 2016-04-13 ENCOUNTER — Ambulatory Visit: Payer: Self-pay

## 2016-04-16 ENCOUNTER — Encounter: Payer: Self-pay | Admitting: Family Medicine

## 2016-04-16 ENCOUNTER — Ambulatory Visit: Payer: Medicaid Other | Attending: Family Medicine | Admitting: Family Medicine

## 2016-04-16 VITALS — BP 156/84 | HR 84 | Temp 97.5°F | Ht 59.0 in | Wt 153.0 lb

## 2016-04-16 DIAGNOSIS — M25522 Pain in left elbow: Secondary | ICD-10-CM | POA: Diagnosis not present

## 2016-04-16 DIAGNOSIS — E119 Type 2 diabetes mellitus without complications: Secondary | ICD-10-CM

## 2016-04-16 DIAGNOSIS — I10 Essential (primary) hypertension: Secondary | ICD-10-CM | POA: Diagnosis not present

## 2016-04-16 DIAGNOSIS — Z7982 Long term (current) use of aspirin: Secondary | ICD-10-CM | POA: Insufficient documentation

## 2016-04-16 DIAGNOSIS — G8929 Other chronic pain: Secondary | ICD-10-CM | POA: Diagnosis not present

## 2016-04-16 DIAGNOSIS — E1121 Type 2 diabetes mellitus with diabetic nephropathy: Secondary | ICD-10-CM | POA: Insufficient documentation

## 2016-04-16 DIAGNOSIS — Z7984 Long term (current) use of oral hypoglycemic drugs: Secondary | ICD-10-CM | POA: Insufficient documentation

## 2016-04-16 DIAGNOSIS — R51 Headache: Secondary | ICD-10-CM | POA: Diagnosis present

## 2016-04-16 DIAGNOSIS — M25561 Pain in right knee: Secondary | ICD-10-CM | POA: Insufficient documentation

## 2016-04-16 DIAGNOSIS — G43109 Migraine with aura, not intractable, without status migrainosus: Secondary | ICD-10-CM | POA: Insufficient documentation

## 2016-04-16 DIAGNOSIS — M25511 Pain in right shoulder: Secondary | ICD-10-CM | POA: Diagnosis not present

## 2016-04-16 DIAGNOSIS — Z79899 Other long term (current) drug therapy: Secondary | ICD-10-CM | POA: Diagnosis not present

## 2016-04-16 DIAGNOSIS — M25562 Pain in left knee: Secondary | ICD-10-CM | POA: Insufficient documentation

## 2016-04-16 DIAGNOSIS — R413 Other amnesia: Secondary | ICD-10-CM | POA: Insufficient documentation

## 2016-04-16 DIAGNOSIS — M722 Plantar fascial fibromatosis: Secondary | ICD-10-CM | POA: Diagnosis not present

## 2016-04-16 DIAGNOSIS — F17200 Nicotine dependence, unspecified, uncomplicated: Secondary | ICD-10-CM | POA: Insufficient documentation

## 2016-04-16 LAB — GLUCOSE, POCT (MANUAL RESULT ENTRY): POC Glucose: 215 mg/dl — AB (ref 70–99)

## 2016-04-16 LAB — POCT GLYCOSYLATED HEMOGLOBIN (HGB A1C): HEMOGLOBIN A1C: 6.4

## 2016-04-16 MED ORDER — ACETAMINOPHEN-CODEINE #3 300-30 MG PO TABS: 1.0000 | ORAL_TABLET | Freq: Two times a day (BID) | ORAL | 0 refills | Status: DC

## 2016-04-16 MED ORDER — AMITRIPTYLINE HCL 100 MG PO TABS: 100.0000 mg | ORAL_TABLET | Freq: Every day | ORAL | 5 refills | Status: DC

## 2016-04-16 MED ORDER — DICLOFENAC SODIUM 1 % TD GEL: 2.0000 g | Freq: Four times a day (QID) | TRANSDERMAL | 3 refills | Status: DC

## 2016-04-16 MED ORDER — HYDROCHLOROTHIAZIDE 12.5 MG PO TABS: 25.0000 mg | ORAL_TABLET | Freq: Every day | ORAL | 2 refills | Status: DC

## 2016-04-16 MED ORDER — HYDROCHLOROTHIAZIDE 12.5 MG PO TABS: 12.5000 mg | ORAL_TABLET | Freq: Every day | ORAL | 2 refills | Status: DC

## 2016-04-16 MED FILL — AMITRIPTYLINE HCL 100 MG TA: 100 | 30 days supply | Qty: 30 | Fill #0

## 2016-04-16 MED FILL — ?HYDROCHLOROTHIAZIDE 12.5MG: 12.5 | 30 days supply | Qty: 60 | Fill #0

## 2016-04-16 MED FILL — VOLTAREN 1% GEL: 1 | 12 days supply | Qty: 100 | Fill #0

## 2016-04-16 NOTE — Patient Instructions (Addendum)
April Mcmahon was seen today for headache, back pain and leg pain.  Diagnoses and all orders for this visit:  Controlled type 2 diabetes mellitus with diabetic nephropathy, without long-term current use of insulin (HCC) -     POCT glucose (manual entry) -     POCT glycosylated hemoglobin (Hb A1C)  Migraine with aura and without status migrainosus, not intractable -     Ambulatory referral to Neurology -     amitriptyline (ELAVIL) 100 MG tablet; Take 1 tablet (100 mg total) by mouth at bedtime. -     acetaminophen-codeine (TYLENOL #3) 300-30 MG tablet; Take 1 tablet by mouth 2 (two) times daily.  Essential hypertension -     Discontinue: hydrochlorothiazide (HYDRODIURIL) 12.5 MG tablet; Take 2 tablets (25 mg total) by mouth daily. (Patient not taking: Reported on 04/16/2016) -     hydrochlorothiazide (HYDRODIURIL) 12.5 MG tablet; Take 1 tablet (12.5 mg total) by mouth daily.  Memory loss -     Ambulatory referral to Neurology  Plantar fasciitis, bilateral -     diclofenac sodium (VOLTAREN) 1 % GEL; Apply 2 g topically 4 (four) times daily. Apply to feet -     acetaminophen-codeine (TYLENOL #3) 300-30 MG tablet; Take 1 tablet by mouth 2 (two) times daily.    There are 4 medication changes  Increase elavil to 100 mg nightly for headache Start HCTZ 12.5 mg daily for high blood pressure Start voltaren gel apply to feet 4 times daily Tylenol #3 two times daily as needed for pain  f/u in 4 weeks for HTN, headache and foot pains   Dr .Adrian Blackwater
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
## 2016-04-16 NOTE — Progress Notes (Signed)
Pt is here for headaches, leg pains.

## 2016-04-19 ENCOUNTER — Ambulatory Visit: Payer: Self-pay | Attending: Family Medicine

## 2016-04-19 MED ORDER — METFORMIN HCL 1000 MG PO TABS: 1000.0000 mg | ORAL_TABLET | Freq: Two times a day (BID) | ORAL | 11 refills | Status: DC

## 2016-04-19 MED FILL — metFORMIN HCL 1000 MG TABS: 1000 | 30 days supply | Qty: 60 | Fill #0 | Status: TO

## 2016-04-19 NOTE — Assessment & Plan Note (Signed)
A; persistent HA in setting of HTN. CT head negative for acute findings P: Increase elavil to 100 mg daily Referral to neurology Tylenol #3 prn pain up to BID

## 2016-04-19 NOTE — Assessment & Plan Note (Signed)
A: HTN  Med: compliant with all except for HCTZ P: Start HCTZ 12.5 mg daily

## 2016-04-19 NOTE — Assessment & Plan Note (Addendum)
A1c at goal Elevated CBG today Continue current regimen of metformin 1000 mg BID  Check vit B12 level at f/u

## 2016-04-19 NOTE — Assessment & Plan Note (Signed)
Foot tenderness on both heels consistent with plantar fasciitis Add diclofenac gel, tylenol #3

## 2016-05-05 MED FILL — LISINOPRIL 40 MG TABLET: 40 | 30 days supply | Qty: 30 | Fill #3 | Status: TO

## 2016-05-05 MED FILL — VOLTAREN 1% GEL: 1 | 12 days supply | Qty: 100 | Fill #1 | Status: TO

## 2016-05-21 ENCOUNTER — Ambulatory Visit: Payer: Medicaid Other | Attending: Family Medicine | Admitting: Family Medicine

## 2016-05-21 ENCOUNTER — Encounter: Payer: Self-pay | Admitting: Family Medicine

## 2016-05-21 VITALS — BP 148/79 | HR 78 | Temp 98.0°F | Ht 59.0 in | Wt 149.8 lb

## 2016-05-21 DIAGNOSIS — F17200 Nicotine dependence, unspecified, uncomplicated: Secondary | ICD-10-CM | POA: Insufficient documentation

## 2016-05-21 DIAGNOSIS — G8929 Other chronic pain: Secondary | ICD-10-CM | POA: Diagnosis not present

## 2016-05-21 DIAGNOSIS — I10 Essential (primary) hypertension: Secondary | ICD-10-CM | POA: Diagnosis not present

## 2016-05-21 DIAGNOSIS — Z1231 Encounter for screening mammogram for malignant neoplasm of breast: Secondary | ICD-10-CM

## 2016-05-21 DIAGNOSIS — M722 Plantar fascial fibromatosis: Secondary | ICD-10-CM | POA: Diagnosis not present

## 2016-05-21 DIAGNOSIS — Z0000 Encounter for general adult medical examination without abnormal findings: Secondary | ICD-10-CM

## 2016-05-21 DIAGNOSIS — M25561 Pain in right knee: Secondary | ICD-10-CM | POA: Insufficient documentation

## 2016-05-21 DIAGNOSIS — Z79899 Other long term (current) drug therapy: Secondary | ICD-10-CM | POA: Insufficient documentation

## 2016-05-21 DIAGNOSIS — Z7982 Long term (current) use of aspirin: Secondary | ICD-10-CM | POA: Insufficient documentation

## 2016-05-21 DIAGNOSIS — Z7984 Long term (current) use of oral hypoglycemic drugs: Secondary | ICD-10-CM | POA: Diagnosis not present

## 2016-05-21 DIAGNOSIS — M25562 Pain in left knee: Secondary | ICD-10-CM | POA: Diagnosis not present

## 2016-05-21 DIAGNOSIS — R51 Headache: Secondary | ICD-10-CM | POA: Insufficient documentation

## 2016-05-21 DIAGNOSIS — G43109 Migraine with aura, not intractable, without status migrainosus: Secondary | ICD-10-CM

## 2016-05-21 DIAGNOSIS — E1121 Type 2 diabetes mellitus with diabetic nephropathy: Secondary | ICD-10-CM | POA: Diagnosis not present

## 2016-05-21 DIAGNOSIS — M25511 Pain in right shoulder: Secondary | ICD-10-CM | POA: Insufficient documentation

## 2016-05-21 DIAGNOSIS — M25522 Pain in left elbow: Secondary | ICD-10-CM | POA: Diagnosis not present

## 2016-05-21 DIAGNOSIS — H538 Other visual disturbances: Secondary | ICD-10-CM | POA: Insufficient documentation

## 2016-05-21 LAB — GLUCOSE, POCT (MANUAL RESULT ENTRY): POC Glucose: 253 mg/dl — AB (ref 70–99)

## 2016-05-21 MED ORDER — AMITRIPTYLINE HCL 100 MG PO TABS: 100.0000 mg | ORAL_TABLET | Freq: Every day | ORAL | 5 refills | Status: DC

## 2016-05-21 MED FILL — VIT D2 1.25 MG (50,000 UNIT: 1.25 MG | 28 days supply | Qty: 4 | Fill #0 | Status: TO

## 2016-05-21 NOTE — Patient Instructions (Addendum)
April Mcmahon was seen today for diabetes, hypertension and headache.  Diagnoses and all orders for this visit:  Controlled type 2 diabetes mellitus with diabetic nephropathy, without long-term current use of insulin (HCC) -     POCT glucose (manual entry) -     Ambulatory referral to Ophthalmology  Migraine with aura and without status migrainosus, not intractable -     amitriptyline (ELAVIL) 100 MG tablet; Take 1 tablet (100 mg total) by mouth at bedtime. -     Ambulatory referral to Neurology  Essential hypertension  Plantar fasciitis, bilateral -     Ambulatory referral to [place] maintenance -     Ambulatory referral to Gastroenterology  Visit for screening mammogram -     MM DIGITAL SCREENING BILATERAL; Future  increase HCTZ to 25 mg (two tablets) twice daily  f/u in 6 weeks for pap smear  Dr. Adrian Blackwater    Colonoscopy, Adult A colonoscopy is an exam to look at the large intestine. It is done to check for problems, such as:  Lumps (tumors).  Growths (polyps).  Swelling (inflammation).  Bleeding. What happens before the procedure? Eating and drinking  Follow instructions from your doctor about eating and drinking. These instructions may include:  A few days before the procedure - follow a low-fiber diet.  Avoid nuts.  Avoid seeds.  Avoid dried fruit.  Avoid raw fruits.  Avoid vegetables.  1-3 days before the procedure - follow a clear liquid diet. Avoid liquids that have red or purple dye. Drink only clear liquids, such as:  Clear broth or bouillon.  Black coffee or tea.  Clear juice.  Clear soft drinks or sports drinks.  Gelatin dessert.  Popsicles.  On the day of the procedure - do not eat or drink anything during the 2 hours before the procedure. Bowel prep  If you were prescribed an oral bowel prep:  Take it as told by your doctor. Starting the day before your procedure, you will need to drink a lot of liquid. The liquid will cause  you to poop (have bowel movements) until your poop is almost clear or light green.  If your skin or butt gets irritated from diarrhea, you may:  Wipe the area with wipes that have medicine in them, such as adult wet wipes with aloe and vitamin E.  Put something on your skin that soothes the area, such as petroleum jelly.  If you throw up (vomit) while drinking the bowel prep, take a break for up to 60 minutes. Then begin the bowel prep again. If you keep throwing up and you cannot take the bowel prep without throwing up, call your doctor. General instructions   Ask your doctor about changing or stopping your normal medicines. This is important if you take diabetes medicines or blood thinners.  Plan to have someone take you home from the hospital or clinic. What happens during the procedure?  An IV tube may be put into one of your veins.  You will be given medicine to help you relax (sedative).  To reduce your risk of infection:  Your doctors will wash their hands.  Your anal area will be washed with soap.  You will be asked to lie on your side with your knees bent.  Your doctor will get a long, thin, flexible tube ready. The tube will have a camera and a light on the end.  The tube will be put into your anus.  The tube will be gently put into  your large intestine.  Air will be delivered into your large intestine to keep it open. You may feel some pressure or cramping.  The camera will be used to take photos.  A small tissue sample may be removed from your body to be looked at under a microscope (biopsy). If any possible problems are found, the tissue will be sent to a lab for testing.  If small growths are found, your doctor may remove them and have them checked for cancer.  The tube that was put into your anus will be slowly removed. The procedure may vary among doctors and hospitals. What happens after the procedure?  Your doctor will check on you often until the  medicines you were given have worn off.  Do not drive for 24 hours after the procedure.  You may have a small amount of blood in your poop.  You may pass gas.  You may have mild cramps or bloating in your belly (abdomen).  It is up to you to get the results of your procedure. Ask your doctor, or the department performing the procedure, when your results will be ready. This information is not intended to replace advice given to you by your health care provider. Make sure you discuss any questions you have with your health care provider. Document Released: 02/20/2010 Document Revised: 11/19/2015 Document Reviewed: 04/01/2015 Elsevier Interactive Patient Education  2017 [employer].
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
## 2016-05-23 NOTE — Assessment & Plan Note (Signed)
Improved with elavil 100 mg nightly Continue elavil Neurology referral placed

## 2016-05-23 NOTE — Assessment & Plan Note (Signed)
A: HTN improved with addition of HCTZ P: Increase HCTZ to 25 mg daily

## 2016-05-23 NOTE — Assessment & Plan Note (Signed)
Persistent despite elavil and tylenol #3 Podiatry referral placed

## 2016-05-23 NOTE — Assessment & Plan Note (Signed)
Well controlled with A1c at goal Opthalmology referral placed

## 2016-06-04 ENCOUNTER — Ambulatory Visit
Admission: RE | Admit: 2016-06-04 | Discharge: 2016-06-04 | Disposition: A | Discharge status: Home / Self Care | Payer: Medicaid Other | Source: Ambulatory Visit | Attending: Family Medicine | Admitting: Family Medicine

## 2016-06-04 DIAGNOSIS — Z1231 Encounter for screening mammogram for malignant neoplasm of breast: Secondary | ICD-10-CM

## 2016-06-06 ENCOUNTER — Encounter (HOSPITAL_COMMUNITY): Payer: Self-pay

## 2016-06-06 ENCOUNTER — Ambulatory Visit (INDEPENDENT_AMBULATORY_CARE_PROVIDER_SITE_OTHER): Payer: Medicaid Other

## 2016-06-06 ENCOUNTER — Ambulatory Visit (HOSPITAL_COMMUNITY)
Admission: EM | Admit: 2016-06-06 | Discharge: 2016-06-06 | Disposition: A | Discharge status: Home / Self Care | Payer: Medicaid Other | Attending: Internal Medicine | Admitting: Internal Medicine

## 2016-06-06 DIAGNOSIS — S8001XA Contusion of right knee, initial encounter: Secondary | ICD-10-CM | POA: Diagnosis not present

## 2016-06-06 DIAGNOSIS — M25561 Pain in right knee: Secondary | ICD-10-CM | POA: Diagnosis not present

## 2016-06-06 DIAGNOSIS — W19XXXA Unspecified fall, initial encounter: Secondary | ICD-10-CM | POA: Diagnosis not present

## 2016-06-06 DIAGNOSIS — R42 Dizziness and giddiness: Secondary | ICD-10-CM

## 2016-06-06 MED ORDER — KETOROLAC TROMETHAMINE 30 MG/ML IJ SOLN
30.0000 mg | Freq: Once | INTRAMUSCULAR | Status: AC
  Administered 2016-06-06: 30 mg via INTRAMUSCULAR

## 2016-06-06 MED ORDER — KETOROLAC TROMETHAMINE 30 MG/ML IJ SOLN
INTRAMUSCULAR | Status: AC
  Filled 2016-06-06: qty 1

## 2016-06-06 MED ORDER — NAPROXEN 500 MG PO TABS: 500.0000 mg | ORAL_TABLET | Freq: Two times a day (BID) | ORAL | 0 refills | Status: DC | PRN

## 2016-06-06 NOTE — ED Provider Notes (Signed)
CSN: [id number]     Arrival date & time 06/06/16  1505 History   First MD Initiated Contact with Patient 06/06/16 1715     Chief Complaint  Patient presents with  . Fall   (Consider location/radiation/quality/duration/timing/severity/associated sxs/prior Treatment) 56 year old female presents with right knee pain and injury. She got dizzy in her bathroom yesterday and fell and hit her right inside of her knee on the toilet. Now experiencing intense pain, swelling and decreased movement. She has taken Tylenol with Codeine with minimal relief. She has a history of multiple chronic health issues, including diabetes, hypertension, migraine, chronic knee pain (bilateral) and back pain and on multiple medication to manage those conditions. She has also had syncope episodes with her migraine headaches- last episode in January. Was seen here and then sent to ER for further management.    The history is provided by the patient and a caregiver. The history is limited by a language barrier.    Past Medical History:  Diagnosis Date  . Bilateral chronic knee pain 06/2014  . Chronic low back pain 06/2014  . Chronic migraine 02/1985  . Diabetes mellitus without complication ([place]) 40/9811  . Hypertension 09/2013   History reviewed. No pertinent surgical history. Family History  Problem Relation Age of Onset  . Breast cancer Neg Hx    Social History  Substance Use Topics  . Smoking status: Current Some Day Smoker  . Smokeless tobacco: Never Used  . Alcohol use No   OB History    No data available     Review of Systems  Constitutional: Negative for diaphoresis and fever.  HENT: Negative for congestion, ear pain, sinus pain, sinus pressure and tinnitus.   Eyes: Negative for visual disturbance.  Respiratory: Negative for cough, chest tightness, shortness of breath and wheezing.   Cardiovascular: Negative for chest pain.  Musculoskeletal: Positive for arthralgias and joint swelling.  Skin:  Positive for color change.  Neurological: Positive for dizziness, syncope and headaches. Negative for weakness and numbness.  Hematological: Negative for adenopathy.    Allergies  Patient has no known allergies.  Home Medications   Prior to Admission medications   Medication Sig Start Date End Date Taking? Authorizing Provider  acetaminophen-codeine (TYLENOL #3) 300-30 MG tablet Take 1 tablet by mouth 2 (two) times daily. 04/16/16  Yes Funches, Josalyn, MD  amitriptyline (ELAVIL) 100 MG tablet Take 1 tablet (100 mg total) by mouth at bedtime. 05/21/16  Yes Funches, Adriana Mccallum, MD  aspirin 325 MG tablet Take 650 mg by mouth every 6 (six) hours as needed. For headache   Yes [provider]  diclofenac sodium (VOLTAREN) 1 % GEL Apply 2 g topically 4 (four) times daily. Apply to feet 04/16/16  Yes Funches, Josalyn, MD  ergocalciferol (DRISDOL) 50000 units capsule Take 1 capsule (50,000 Units total) by mouth once a week. 01/20/16  Yes Funches, Josalyn, MD  fenofibrate 160 MG tablet Take 1 tablet (160 mg total) by mouth daily. For triglyucerides 12/23/15  Yes Funches, Josalyn, MD  hydrochlorothiazide (HYDRODIURIL) 12.5 MG tablet Take 1 tablet (12.5 mg total) by mouth daily. 04/16/16  Yes Funches, Josalyn, MD  lisinopril (PRINIVIL,ZESTRIL) 40 MG tablet Take 1 tablet (40 mg total) by mouth daily. 12/23/15  Yes Funches, Adriana Mccallum, MD  metFORMIN (GLUCOPHAGE) 1000 MG tablet Take 1 tablet (1,000 mg total) by mouth 2 (two) times daily with a meal. 04/19/16  Yes Funches, Josalyn, MD  naproxen (NAPROSYN) 500 MG tablet Take 1 tablet (500 mg total) by  mouth 2 (two) times daily as needed for moderate pain. 06/06/16   Katy Apo, NP   Meds Ordered and Administered this Visit   Medications  ketorolac (TORADOL) 30 MG/ML injection 30 mg (30 mg Intramuscular Given 06/06/16 1851)    BP (!) 117/50 (BP Location: Right Arm)   Pulse 70   Temp 97.8 F (36.6 C) (Oral)   Resp 20   SpO2 96%  No data  found.   Physical Exam  Constitutional: She is oriented to person, place, and time. She appears well-developed and well-nourished.  She is sitting in a wheelchair holding onto her right knee and rocking back and forth due to pain.   HENT:  Head: Normocephalic and atraumatic.  Right Ear: External ear normal.  Left Ear: External ear normal.  Eyes: EOM are normal. Pupils are equal, round, and reactive to light.  Neck: Normal range of motion. Neck supple.  Cardiovascular: Normal rate and regular rhythm.   Musculoskeletal: She exhibits edema and tenderness.       Right knee: She exhibits decreased range of motion, swelling and ecchymosis. She exhibits no laceration, no erythema, normal alignment and normal patellar mobility. Tenderness found. Medial joint line tenderness noted.       Legs: Slight ecchymosis and swelling present on medial aspect of knee just above patella. Very tender and unable to straighten knee without intense pain. No numbness or neuro deficits noted.   Neurological: She is alert and oriented to person, place, and time. She has normal strength. No sensory deficit.  Skin: Skin is warm and dry. Capillary refill takes less than 2 seconds.  Psychiatric: Her speech is normal. Her mood appears anxious. She is agitated (due to pain).    Urgent Care Course     Procedures (including critical care time)  Labs Review Labs Reviewed - No data to display  Imaging Review Dg Knee Ap/lat W/sunrise Right  Result Date: 06/06/2016 CLINICAL DATA:  Fall yesterday.  Right knee pain. EXAM: RIGHT KNEE 3 VIEWS COMPARISON:  None. FINDINGS: No evidence of fracture, dislocation, or joint effusion. No evidence of arthropathy or other focal bone abnormality. Mild soft tissue swelling in the medial right knee. IMPRESSION: No fracture, joint effusion or malalignment in the right knee. Electronically Signed   By: Ilona Sorrel M.D.   On: 06/06/2016 18:17     Visual Acuity Review  Right Eye  Distance:   Left Eye Distance:   Bilateral Distance:    Right Eye Near:   Left Eye Near:    Bilateral Near:         MDM   1. Contusion of right knee, initial encounter    Reviewed negative x-ray results with patient and caregiver. Discussed that she probably has a contusion of her knee. Gave Toradol 30mg  IM today to help with pain. Applied ace wrap for support and comfort. Elevated knee as much as possible to reduce swelling. May start Naproxen 500mg  every 12 hours as needed for pain. Recommend follow-up with her Orthopedic in 3 to 4 days if not improving.      Katy Apo, NP 06/07/16 1349

## 2016-06-06 NOTE — Discharge Instructions (Signed)
You were given a shot of Toradol today to help with pain and swelling. Wear ace wrap for support. May start Naproxen twice a day as needed for pain and swelling. Keep knee elevated as much as possible. Follow-up with the Orthopedic in 3 to 4 days if not improving.

## 2016-06-06 NOTE — ED Triage Notes (Signed)
Pt fell yesterday while in the bathroom and hurt her right leg. Said she got dizzy and fell down. Hasn't happen before.

## 2016-06-07 ENCOUNTER — Other Ambulatory Visit: Payer: Self-pay | Admitting: Family Medicine

## 2016-06-07 DIAGNOSIS — R928 Other abnormal and inconclusive findings on diagnostic imaging of breast: Secondary | ICD-10-CM

## 2016-06-09 ENCOUNTER — Ambulatory Visit
Admission: RE | Admit: 2016-06-09 | Discharge: 2016-06-09 | Disposition: A | Discharge status: Home / Self Care | Payer: Medicaid Other | Source: Ambulatory Visit | Attending: Family Medicine | Admitting: Family Medicine

## 2016-06-09 DIAGNOSIS — R928 Other abnormal and inconclusive findings on diagnostic imaging of breast: Secondary | ICD-10-CM

## 2016-06-16 ENCOUNTER — Encounter: Payer: Self-pay | Admitting: Family Medicine

## 2016-06-17 ENCOUNTER — Ambulatory Visit (INDEPENDENT_AMBULATORY_CARE_PROVIDER_SITE_OTHER): Payer: Medicaid Other | Admitting: Podiatry

## 2016-06-17 ENCOUNTER — Encounter: Payer: Self-pay | Admitting: Podiatry

## 2016-06-17 ENCOUNTER — Ambulatory Visit: Payer: Medicaid Other

## 2016-06-17 DIAGNOSIS — E1142 Type 2 diabetes mellitus with diabetic polyneuropathy: Secondary | ICD-10-CM

## 2016-06-17 DIAGNOSIS — M779 Enthesopathy, unspecified: Principal | ICD-10-CM

## 2016-06-17 DIAGNOSIS — M722 Plantar fascial fibromatosis: Secondary | ICD-10-CM

## 2016-06-17 DIAGNOSIS — M778 Other enthesopathies, not elsewhere classified: Secondary | ICD-10-CM

## 2016-06-17 MED ORDER — GABAPENTIN 300 MG PO CAPS: 300.0000 mg | ORAL_CAPSULE | Freq: Three times a day (TID) | ORAL | 3 refills | Status: DC

## 2016-06-17 MED ORDER — GABAPENTIN 100 MG PO CAPS: 100.0000 mg | ORAL_CAPSULE | Freq: Three times a day (TID) | ORAL | 3 refills | Status: DC
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
## 2016-06-21 ENCOUNTER — Ambulatory Visit (HOSPITAL_COMMUNITY)
Admission: RE | Admit: 2016-06-21 | Discharge: 2016-06-21 | Disposition: A | Discharge status: Home / Self Care | Payer: Medicaid Other | Source: Ambulatory Visit | Attending: Family Medicine | Admitting: Family Medicine

## 2016-06-21 ENCOUNTER — Encounter: Payer: Self-pay | Admitting: Family Medicine

## 2016-06-21 ENCOUNTER — Ambulatory Visit: Payer: Medicaid Other | Attending: Family Medicine | Admitting: Family Medicine

## 2016-06-21 VITALS — BP 126/69 | HR 74 | Temp 97.9°F | Wt 148.4 lb

## 2016-06-21 DIAGNOSIS — Z7984 Long term (current) use of oral hypoglycemic drugs: Secondary | ICD-10-CM | POA: Diagnosis not present

## 2016-06-21 DIAGNOSIS — M25561 Pain in right knee: Secondary | ICD-10-CM | POA: Insufficient documentation

## 2016-06-21 DIAGNOSIS — M722 Plantar fascial fibromatosis: Secondary | ICD-10-CM

## 2016-06-21 DIAGNOSIS — E1121 Type 2 diabetes mellitus with diabetic nephropathy: Secondary | ICD-10-CM | POA: Insufficient documentation

## 2016-06-21 DIAGNOSIS — Z7982 Long term (current) use of aspirin: Secondary | ICD-10-CM | POA: Insufficient documentation

## 2016-06-21 DIAGNOSIS — G43109 Migraine with aura, not intractable, without status migrainosus: Secondary | ICD-10-CM | POA: Diagnosis not present

## 2016-06-21 DIAGNOSIS — R1319 Other dysphagia: Secondary | ICD-10-CM

## 2016-06-21 DIAGNOSIS — R1310 Dysphagia, unspecified: Secondary | ICD-10-CM | POA: Diagnosis not present

## 2016-06-21 DIAGNOSIS — F17200 Nicotine dependence, unspecified, uncomplicated: Secondary | ICD-10-CM | POA: Diagnosis not present

## 2016-06-21 DIAGNOSIS — G8929 Other chronic pain: Secondary | ICD-10-CM | POA: Insufficient documentation

## 2016-06-21 DIAGNOSIS — M25562 Pain in left knee: Secondary | ICD-10-CM

## 2016-06-21 LAB — GLUCOSE, POCT (MANUAL RESULT ENTRY): POC Glucose: 177 mg/dl — AB (ref 70–99)

## 2016-06-21 MED ORDER — METHYLPREDNISOLONE ACETATE 40 MG/ML IJ SUSP: 40.0000 mg | Freq: Once | INTRAMUSCULAR | Status: DC

## 2016-06-21 MED ORDER — METHYLPREDNISOLONE ACETATE 40 MG/ML IJ SUSP
40.0000 mg | Freq: Once | INTRAMUSCULAR | Status: DC
Start: 2016-06-21 — End: 2016-06-21

## 2016-06-21 MED ORDER — PANTOPRAZOLE SODIUM 40 MG PO TBEC: 40.0000 mg | DELAYED_RELEASE_TABLET | Freq: Every day | ORAL | 0 refills | Status: DC

## 2016-06-21 MED ORDER — ACETAMINOPHEN-CODEINE #3 300-30 MG PO TABS: 1.0000 | ORAL_TABLET | Freq: Two times a day (BID) | ORAL | 0 refills | Status: DC

## 2016-06-21 NOTE — Progress Notes (Signed)
Subjective:  Patient ID: April Mcmahon, female    DOB: [birth year]  Age: 56 y.o. MRN: [id number]  CC: Follow-up  [employer] [place] ID # [id number]  April Mcmahon presents with her daughter for    1. Urgent care follow up R knee pain: she had a fall and hit her R knee. Her fall occurred in the rest room. She was dizzy. She had swelling which has subsided. She still has pain in her R knee. Massage helps a bit. Her current pain level is 8/10. She has had negative x-ray of her R knee at urgent care. She takes tylenol #3 that does improve her pain. She is not taking the prescribed naproxen due to headache and  dizziness.   She has known chronic bilateral knee pain: x one years. 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 She reports fatigue in knees when she walks. Pain is worse with walking. There is no swelling in knees. No injury to knees. The ibuprofen does help with knee pain when she takes it. She reports that ibuprofen makes her feel tired. R knee is >L knee with 8/10 pain.   2. Pain with swallowing x 3 months. She denies reflux. She is able to swallow liquid without difficulty. She has trouble swallowing solids. She denies fever, chills and weight loss.   Social History  Substance Use Topics  . Smoking status: Current Some Day Smoker  . Smokeless tobacco: Never Used  . Alcohol use No    Outpatient Medications Prior to Visit  Medication Sig Dispense Refill  . acetaminophen-codeine (TYLENOL #3) 300-30 MG tablet Take 1 tablet by mouth 2 (two) times daily. 60 tablet 0  . amitriptyline (ELAVIL) 100 MG tablet Take 1 tablet (100 mg total) by mouth at bedtime. 30 tablet 5  . aspirin 325 MG tablet Take 650 mg by mouth every 6 (six) hours as needed. For headache    . diclofenac sodium (VOLTAREN) 1 % GEL Apply 2 g topically 4 (four) times daily. Apply to feet 100 g 3  . ergocalciferol (DRISDOL) 50000 units capsule Take 1 capsule (50,000 Units total) by mouth once a week. 4 capsule  2  . fenofibrate 160 MG tablet Take 1 tablet (160 mg total) by mouth daily. For triglyucerides 30 tablet 11  . gabapentin (NEURONTIN) 100 MG capsule Take 1 capsule (100 mg total) by mouth 3 (three) times daily. 90 capsule 3  . hydrochlorothiazide (HYDRODIURIL) 12.5 MG tablet Take 1 tablet (12.5 mg total) by mouth daily. 30 tablet 2  . lisinopril (PRINIVIL,ZESTRIL) 40 MG tablet Take 1 tablet (40 mg total) by mouth daily. 30 tablet 11  . metFORMIN (GLUCOPHAGE) 1000 MG tablet Take 1 tablet (1,000 mg total) by mouth 2 (two) times daily with a meal. 60 tablet 11  . naproxen (NAPROSYN) 500 MG tablet Take 1 tablet (500 mg total) by mouth 2 (two) times daily as needed for moderate pain. 20 tablet 0   No facility-administered medications prior to visit.     ROS Review of Systems  Constitutional: Negative for chills and fever.  Eyes: Negative for visual disturbance.  Respiratory: Negative for shortness of breath.   Cardiovascular: Negative for chest pain and palpitations.  Gastrointestinal: Negative for abdominal pain and blood in stool.  Genitourinary: Negative for menstrual problem and vaginal bleeding.  Musculoskeletal: Positive for arthralgias (b/l knee pain x 1 year ) and back pain.  Skin: Negative for rash.  Allergic/Immunologic: Negative for immunocompromised state.  Neurological: Positive  for headaches (x 30 years ).  Hematological: Negative for adenopathy. Does not bruise/bleed easily.  Psychiatric/Behavioral: Negative for dysphoric mood and suicidal ideas.    Objective:  BP 126/69   Pulse 74   Temp 97.9 F (36.6 C) (Oral)   Wt 148 lb 6.4 oz (67.3 kg)   SpO2 99%   BMI 29.97 kg/m   BP/Weight 06/21/2016 06/06/2016 3/38/3291  Systolic BP 916 606 004  Diastolic BP 69 50 79  Wt. (Lbs) 148.4 - 149.8  BMI 29.97 - 30.26   Physical Exam  Constitutional: She is oriented to person, place, and time. She appears well-developed and well-nourished. No distress.  HENT:  Head: Normocephalic  and atraumatic.  Cardiovascular: Normal rate, regular rhythm, normal heart sounds and intact distal pulses.   Pulmonary/Chest: Effort normal and breath sounds normal.  Musculoskeletal: She exhibits no edema.       Feet:  Neurological: She is alert and oriented to person, place, and time. She displays normal reflexes. No cranial nerve deficit. She exhibits normal muscle tone. Coordination normal.  Skin: Skin is warm and dry. No rash noted.  Psychiatric: She has a normal mood and affect.   Lab Results  Component Value Date   HGBA1C 6.4 04/16/2016   CBG 253   Assessment & Plan:   April Mcmahon was seen today for follow-up.  Diagnoses and all orders for this visit:  Controlled type 2 diabetes mellitus with diabetic nephropathy, without long-term current use of insulin (HCC) -     POCT glucose (manual entry)  Bilateral chronic knee pain -     Discontinue: methylPREDNISolone acetate (DEPO-MEDROL) injection 40 mg; Inject 1 mL (40 mg total) into the muscle once. -     Discontinue: methylPREDNISolone acetate (DEPO-MEDROL) injection 40 mg; Inject 1 mL (40 mg total) into the articular space once. -     Discontinue: methylPREDNISolone acetate (DEPO-MEDROL) injection 40 mg; Inject 1 mL (40 mg total) into the articular space once.  Esophageal dysphagia -     DG Esophagus; Future -     pantoprazole (PROTONIX) 40 MG tablet; Take 1 tablet (40 mg total) by mouth daily.  Migraine with aura and without status migrainosus, not intractable -     acetaminophen-codeine (TYLENOL #3) 300-30 MG tablet; Take 1 tablet by mouth 2 (two) times daily.  Plantar fasciitis, bilateral -     acetaminophen-codeine (TYLENOL #3) 300-30 MG tablet; Take 1 tablet by mouth 2 (two) times daily.  Chronic pain of right knee   No orders of the defined types were placed in this encounter.   Follow-up: Return in about 3 weeks (around 07/12/2016) for pap smear and knee injections.   Boykin Nearing MD   screenign HIV anf hep

## 2016-06-21 NOTE — Assessment & Plan Note (Signed)
Chronic pain acutely worsened in her R knee following fall Improving She requested steroid injection today unfortunately there was no lidocaine without epi for the injection  Plan: Refilled tylenol #3 Close f/u for injections

## 2016-06-21 NOTE — Patient Instructions (Addendum)
Taegen was seen today for follow-up.  Diagnoses and all orders for this visit:  Controlled type 2 diabetes mellitus with diabetic nephropathy, without long-term current use of insulin (HCC) -     POCT glucose (manual entry)  Bilateral chronic knee pain -     Discontinue: methylPREDNISolone acetate (DEPO-MEDROL) injection 40 mg; Inject 1 mL (40 mg total) into the muscle once. -     methylPREDNISolone acetate (DEPO-MEDROL) injection 40 mg; Inject 1 mL (40 mg total) into the articular space once. -     methylPREDNISolone acetate (DEPO-MEDROL) injection 40 mg; Inject 1 mL (40 mg total) into the articular space once.  Esophageal dysphagia -     DG Esophagus; Future   You have received a shot of steroid in your joint today. Rest and ice knee today. Regular activity tomorrow. Look out for redness, swelling, fever,severe pain in joint and call if you experience these symptoms.  F/u in 2-3 weeks for pap smear and knee injections   Dr. Adrian Blackwater

## 2016-06-21 NOTE — Assessment & Plan Note (Signed)
X 3 months Normal swallow study Will add short course of PPI to cover for possible silent reflux

## 2016-06-24 ENCOUNTER — Ambulatory Visit (INDEPENDENT_AMBULATORY_CARE_PROVIDER_SITE_OTHER): Payer: Medicaid Other | Admitting: Neurology

## 2016-06-24 ENCOUNTER — Encounter: Payer: Self-pay | Admitting: Neurology

## 2016-06-24 VITALS — BP 115/55 | HR 55 | Ht 59.0 in | Wt 149.0 lb

## 2016-06-24 DIAGNOSIS — T3995XA Adverse effect of unspecified nonopioid analgesic, antipyretic and antirheumatic, initial encounter: Secondary | ICD-10-CM

## 2016-06-24 DIAGNOSIS — G4440 Drug-induced headache, not elsewhere classified, not intractable: Secondary | ICD-10-CM | POA: Diagnosis not present

## 2016-06-24 DIAGNOSIS — G43009 Migraine without aura, not intractable, without status migrainosus: Secondary | ICD-10-CM | POA: Diagnosis not present

## 2016-06-24 MED ORDER — METHYLPREDNISOLONE 4 MG PO TBPK: ORAL_TABLET | ORAL | 1 refills | Status: DC

## 2016-06-24 MED ORDER — DIVALPROEX SODIUM ER 250 MG PO TB24: 250.0000 mg | ORAL_TABLET | Freq: Every day | ORAL | 11 refills | Status: DC

## 2016-06-24 NOTE — Patient Instructions (Addendum)
Remember to drink plenty of fluid, eat healthy meals and do not skip any meals. Try to eat protein with a every meal and eat a healthy snack such as fruit or nuts in between meals. Try to keep a regular sleep-wake schedule and try to exercise daily, particularly in the form of walking, 20-30 minutes a day, if you can.   As far as your medications are concerned, I would like to suggest:  Start Depakote at bedtime STOP ALL DAILY TYLENOL, IBUPROFEN and other over the counter medications 6 days Medrol Dosepak:  Day 1: 6 pills with breakfast  Day 2: 5 pills with breakfast  Day 3: 4 pills with breakfast  Day 4: 3 pills with breakfast  Day 5: 2 pills with breakfast  Day 6: 1 pill with breakfast  I would like to see you back in 3 months, sooner if we need to. Please call us with any interim questions, concerns, problems, updates or refill requests.   Our phone number is [phone]. We also have an after hours call service for urgent matters and there is a physician on-call for urgent questions. For any emergencies you know to call 911 or go to the nearest emergency room  Methylprednisolone tablets What is this medicine? METHYLPREDNISOLONE (meth ill pred NISS oh lone) is a corticosteroid. It is commonly used to treat inflammation of the skin, joints, lungs, and other organs. Common conditions treated include asthma, allergies, and arthritis. It is also used for other conditions, such as blood disorders and diseases of the adrenal glands. This medicine may be used for other purposes; ask your health care provider or pharmacist if you have questions. COMMON BRAND NAME(S): Medrol, Medrol Dosepak What should I tell my health care provider before I take this medicine? They need to know if you have any of these conditions: -Cushing's syndrome -eye disease, vision problems -diabetes -glaucoma -heart disease -high blood pressure -infection (especially a virus infection such as chickenpox, cold sores,  or herpes) -liver disease -mental illness -myasthenia gravis -osteoporosis -recently received or scheduled to receive a vaccine -seizures -stomach or intestine problems -thyroid disease -an unusual or allergic reaction to lactose, methylprednisolone, other medicines, foods, dyes, or preservatives -pregnant or trying to get pregnant -breast-feeding How should I use this medicine? Take this medicine by mouth with a glass of water. Follow the directions on the prescription label. Take this medicine with food. If you are taking this medicine once a day, take it in the morning. Do not take it more often than directed. Do not suddenly stop taking your medicine because you may develop a severe reaction. Your doctor will tell you how much medicine to take. If your doctor wants you to stop the medicine, the dose may be slowly lowered over time to avoid any side effects. Talk to your pediatrician regarding the use of this medicine in children. Special care may be needed. Overdosage: If you think you have taken too much of this medicine contact a poison control center or emergency room at once. NOTE: This medicine is only for you. Do not share this medicine with others. What if I miss a dose? If you miss a dose, take it as soon as you can. If it is almost time for your next dose, talk to your doctor or health care professional. You may need to miss a dose or take an extra dose. Do not take double or extra doses without advice. What may interact with this medicine? Do not take this medicine with  any of the following medications: -alefacept -echinacea -live virus vaccines -metyrapone -mifepristone This medicine may also interact with the following medications: -amphotericin B -aspirin and aspirin-like medicines -certain antibiotics like erythromycin, clarithromycin, troleandomycin -certain medicines for diabetes -certain medicines for fungal infections like ketoconazole -certain medicines for  seizures like carbamazepine, phenobarbital, phenytoin -certain medicines that treat or prevent blood clots like warfarin -cholestyramine -cyclosporine -digoxin -diuretics -female hormones, like estrogens and birth control pills -isoniazid -NSAIDs, medicines for pain inflammation, like ibuprofen or naproxen -other medicines for myasthenia gravis -rifampin -vaccines This list may not describe all possible interactions. Give your health care provider a list of all the medicines, herbs, non-prescription drugs, or dietary supplements you use. Also tell them if you smoke, drink alcohol, or use illegal drugs. Some items may interact with your medicine. What should I watch for while using this medicine? Tell your doctor or healthcare professional if your symptoms do not start to get better or if they get worse. Do not stop taking except on your doctor's advice. You may develop a severe reaction. Your doctor will tell you how much medicine to take. This medicine may increase your risk of getting an infection. Tell your doctor or health care professional if you are around anyone with measles or chickenpox, or if you develop sores or blisters that do not heal properly. This medicine may affect blood sugar levels. If you have diabetes, check with your doctor or health care professional before you change your diet or the dose of your diabetic medicine. Tell your doctor or health care professional right away if you have any change in your eyesight. Using this medicine for a long time may increase your risk of low bone mass. Talk to your doctor about bone health. What side effects may I notice from receiving this medicine? Side effects that you should report to your doctor or health care professional as soon as possible: -allergic reactions like skin rash, itching or hives, swelling of the face, lips, or tongue -bloody or tarry stools -changes in vision -hallucination, loss of contact with reality -muscle  cramps -muscle pain -palpitations -signs and symptoms of high blood sugar such as dizziness; dry mouth; dry skin; fruity breath; nausea; stomach pain; increased hunger or thirst; increased urination -signs and symptoms of infection like fever or chills; cough; sore throat; pain or trouble passing urine -trouble passing urine or change in the amount of urine Side effects that usually do not require medical attention (report to your doctor or health care professional if they continue or are bothersome): -changes in emotions or mood -constipation -diarrhea -excessive hair growth on the face or body -headache -nausea, vomiting -trouble sleeping -weight gain This list may not describe all possible side effects. Call your doctor for medical advice about side effects. You may report side effects to FDA at 1-800-FDA-1088. Where should I keep my medicine? Keep out of the reach of children. Store at room temperature between 20 and 25 degrees C (68 and 77 degrees F). Throw away any unused medicine after the expiration date. NOTE: This sheet is a summary. It may not cover all possible information. If you have questions about this medicine, talk to your doctor, pharmacist, or health care provider.  2018 Elsevier/Gold Standard (2015-03-27 15:53:30)  Valproic Acid, Divalproex Sodium delayed or extended-release tablets What is this medicine? DIVALPROEX SODIUM (dye VAL pro ex SO dee um) is used to prevent seizures caused by some forms of epilepsy. It is also used to treat bipolar mania and to  prevent migraine headaches. This medicine may be used for other purposes; ask your health care provider or pharmacist if you have questions. COMMON BRAND NAME(S): Depakote, Depakote ER What should I tell my health care provider before I take this medicine? They need to know if you have any of these conditions: -if you often drink alcohol -kidney disease -liver disease -low platelet counts -mitochondrial  disease -suicidal thoughts, plans, or attempt; a previous suicide attempt by you or a family member -urea cycle disorder (UCD) -an unusual or allergic reaction to divalproex sodium, sodium valproate, valproic acid, other medicines, foods, dyes, or preservatives -pregnant or trying to get pregnant -breast-feeding How should I use this medicine? Take this medicine by mouth with a drink of water. Follow the directions on the prescription label. Do not cut, crush or chew this medicine. You can take it with or without food. If it upsets your stomach, take it with food. Take your medicine at regular intervals. Do not take it more often than directed. Do not stop taking except on your doctor's advice. A special MedGuide will be given to you by the pharmacist with each prescription and refill. Be sure to read this information carefully each time. Talk to your pediatrician regarding the use of this medicine in children. While this drug may be prescribed for children as young as 10 years for selected conditions, precautions do apply. Overdosage: If you think you have taken too much of this medicine contact a poison control center or emergency room at once. NOTE: This medicine is only for you. Do not share this medicine with others. What if I miss a dose? If you miss a dose, take it as soon as you can. If it is almost time for your next dose, take only that dose. Do not take double or extra doses. What may interact with this medicine? Do not take this medicine with any of the following medications: -sodium phenylbutyrate This medicine may also interact with the following medications: -aspirin -certain antibiotics like ertapenem, imipenem, meropenem -certain medicines for depression, anxiety, or psychotic disturbances -certain medicines for seizures like carbamazepine, clonazepam, diazepam, ethosuximide, felbamate, lamotrigine, phenobarbital, phenytoin, primidone, rufinamide, topiramate -certain medicines  that treat or prevent blood clots like warfarin -cholestyramine -female hormones, like estrogens and birth control pills, patches, or rings -propofol -rifampin -ritonavir -tolbutamide -zidovudine This list may not describe all possible interactions. Give your health care provider a list of all the medicines, herbs, non-prescription drugs, or dietary supplements you use. Also tell them if you smoke, drink alcohol, or use illegal drugs. Some items may interact with your medicine. What should I watch for while using this medicine? Tell your doctor or healthcare professional if your symptoms do not get better or they start to get worse. Wear a medical ID bracelet or chain, and carry a card that describes your disease and details of your medicine and dosage times. You may get drowsy, dizzy, or have blurred vision. Do not drive, use machinery, or do anything that needs mental alertness until you know how this medicine affects you. To reduce dizzy or fainting spells, do not sit or stand up quickly, especially if you are an older patient. Alcohol can increase drowsiness and dizziness. Avoid alcoholic drinks. This medicine can make you more sensitive to the sun. Keep out of the sun. If you cannot avoid being in the sun, wear protective clothing and use sunscreen. Do not use sun lamps or tanning beds/booths. Patients and their families should watch out for new  or worsening depression or thoughts of suicide. Also watch out for sudden changes in feelings such as feeling anxious, agitated, panicky, irritable, hostile, aggressive, impulsive, severely restless, overly excited and hyperactive, or not being able to sleep. If this happens, especially at the beginning of treatment or after a change in dose, call your health care professional. Women should inform their doctor if they wish to become pregnant or think they might be pregnant. There is a potential for serious side effects to an unborn child. Talk to your  health care professional or pharmacist for more information. Women who become pregnant while using this medicine may enroll in the [place] Pregnancy Registry by calling [phone]. This registry collects information about the safety of antiepileptic drug use during pregnancy. What side effects may I notice from receiving this medicine? Side effects that you should report to your doctor or health care professional as soon as possible: -allergic reactions like skin rash, itching or hives, swelling of the face, lips, or tongue -changes in vision -redness, blistering, peeling or loosening of the skin, including inside the mouth -signs and symptoms of liver injury like dark yellow or brown urine; general ill feeling or flu-like symptoms; light-colored stools; loss of appetite; nausea; right upper belly pain; unusually weak or tired; yellowing of the eyes or skin -suicidal thoughts or other mood changes -unusual bleeding or bruising Side effects that usually do not require medical attention (report to your doctor or health care professional if they continue or are bothersome): -constipation -diarrhea -dizziness -hair loss -headache -loss of appetite -weight gain This list may not describe all possible side effects. Call your doctor for medical advice about side effects. You may report side effects to FDA at 1-800-FDA-1088. Where should I keep my medicine? Keep out of reach of children. Store at room temperature between 15 and 30 degrees C (59 and 86 degrees F). Keep container tightly closed. Throw away any unused medicine after the expiration date. NOTE: This sheet is a summary. It may not cover all possible information. If you have questions about this medicine, talk to your doctor, pharmacist, or health care provider.  2018 Elsevier/Gold Standard (2015-04-24 07:11:40)

## 2016-06-24 NOTE — Progress Notes (Signed)
[facility]    Provider:  Dr Jaynee Eagles Referring Provider: Boykin Nearing, MD Primary Care Physician:  Boykin Nearing, MD  CC:  Migraine  HPI:  April Mcmahon is a 56 y.o. female here as a referral from Dr. Adrian Blackwater for chronic headaches for 30 years. Past medical history migraines, diabetes, hypertension, chronic low back pain. She is here with an interpreter and still difficult to interview. It is on the [place] and top of the head. It is cramping and throbbing. She has nausea and vomiting, lights bother her, smells can trigger.  She has every day continuous, and they last all day, all are migrainous. She takes Tylenol every 8 hours for years. Discussed medication overuse. Tried Topiramate, on Elavil. She takes Tylenol-3 she takes every 8 hours. She snores. She wakes up with headaches. Very bad in the morning. She is very tired and daughter hears her snoring.   Medications tried: Topamax, Elavil  Reviewed notes, labs and imaging from outside physicians, which showed:   CT Head 02/21/2016: showed No acute intracranial abnormalities including mass lesion or mass effect, hydrocephalus, extra-axial fluid collection, midline shift, hemorrhage, or acute infarction, large ischemic events (personally reviewed images)   Reviewed primary care notes. She has had chronic headaches for 30 years. She has pain in the temples and on the top of her head. She has shooting pain associated with blurry vision. She also has sensitivity to light. Vomiting makes her feel better. She has a stuffy feeling in her years with headache. When she has a headache she feels numbness in the upper back no weakness. Rest and ibuprofen help relieve headache which occurs about every 15 days and last for 2-3 days. Headaches occur more often menstrual bleeding. She has tried Topamax and Fioricet in the past. Chest has memory loss for the past 2-3 weeks. Elavil 100 mg nightly helps with headache and improve  sleep.  Reviewed labs CMP unremarkable  Review of Systems: Patient complains of symptoms per HPI as well as the following symptoms: headache, memory loss Pertinent negatives per HPI. All others negative.   Social History   Social History  . Marital status: Married    Spouse name: N/A  . Number of children: N/A  . Years of education: N/A   Occupational History  . Not on file.   Social History Main Topics  . Smoking status: Current Some Day Smoker  . Smokeless tobacco: Never Used  . Alcohol use No  . Drug use: No  . Sexual activity: Not on file   Other Topics Concern  . Not on file   Social History Narrative  . No narrative on file    Family History  Problem Relation Age of Onset  . Migraines Mother   . Breast cancer Neg Hx     Past Medical History:  Diagnosis Date  . Bilateral chronic knee pain 06/2014  . Chronic low back pain 06/2014  . Chronic migraine 02/1985  . Diabetes mellitus without complication ([place]) 48/5462  . Hypertension 09/2013    Past Surgical History:  Procedure Laterality Date  . no previous surgeries      Current Outpatient Prescriptions  Medication Sig Dispense Refill  . acetaminophen-codeine (TYLENOL #3) 300-30 MG tablet Take 1 tablet by mouth 2 (two) times daily. 60 tablet 0  . amitriptyline (ELAVIL) 100 MG tablet Take 1 tablet (100 mg total) by mouth at bedtime. 30 tablet 5  . aspirin 325 MG tablet Take 650 mg by mouth every 6 (six) hours  as needed. For headache    . diclofenac sodium (VOLTAREN) 1 % GEL Apply 2 g topically 4 (four) times daily. Apply to feet 100 g 3  . ergocalciferol (DRISDOL) 50000 units capsule Take 1 capsule (50,000 Units total) by mouth once a week. 4 capsule 2  . fenofibrate 160 MG tablet Take 1 tablet (160 mg total) by mouth daily. For triglyucerides 30 tablet 11  . gabapentin (NEURONTIN) 100 MG capsule Take 1 capsule (100 mg total) by mouth 3 (three) times daily. 90 capsule 3  . hydrochlorothiazide (HYDRODIURIL)  12.5 MG tablet Take 1 tablet (12.5 mg total) by mouth daily. 30 tablet 2  . lisinopril (PRINIVIL,ZESTRIL) 40 MG tablet Take 1 tablet (40 mg total) by mouth daily. 30 tablet 11  . metFORMIN (GLUCOPHAGE) 1000 MG tablet Take 1 tablet (1,000 mg total) by mouth 2 (two) times daily with a meal. 60 tablet 11  . pantoprazole (PROTONIX) 40 MG tablet Take 1 tablet (40 mg total) by mouth daily. 30 tablet 0  . divalproex (DEPAKOTE ER) 250 MG 24 hr tablet Take 1 tablet (250 mg total) by mouth at bedtime. 30 tablet 11  . methylPREDNISolone (MEDROL DOSEPAK) 4 MG TBPK tablet follow package directions 21 tablet 1   No current facility-administered medications for this visit.     Allergies as of 06/24/2016  . (No Known Allergies)    Vitals: BP (!) 115/55   Pulse (!) 55   Ht 4\' 11"  (1.499 m)   Wt 149 lb (67.6 kg)   BMI 30.09 kg/m  Last Weight:  Wt Readings from Last 1 Encounters:  06/24/16 149 lb (67.6 kg)   Last Height:   Ht Readings from Last 1 Encounters:  06/24/16 4\' 11"  (1.499 m)    Physical exam: Exam: Gen: NAD, conversant, well nourised, obese, well groomed                     CV: RRR, no MRG. No Carotid Bruits. No peripheral edema, warm, nontender Eyes: Conjunctivae clear without exudates or hemorrhage  Neuro: Detailed Neurologic Exam  Speech:    Speech is normal; fluent and spontaneous with normal comprehension.  Cognition:    The patient is oriented to person, place, and time;     recent and remote memory intact;     language fluent;     normal attention, concentration,     fund of knowledge Cranial Nerves:    The pupils are equal, round, and reactive to light. Attempted funduscopic exam could not visualize due to small pupils. Visual fields are full to finger confrontation. Extraocular movements are intact. Trigeminal sensation is intact and the muscles of mastication are normal. The face is symmetric. The palate elevates in the midline. Hearing intact. Voice is normal.  Shoulder shrug is normal. The tongue has normal motion without fasciculations.   Coordination:    No Dysmetria  Gait:    Heel-toe and tandem gait are normal.   Motor Observation:    No asymmetry, no atrophy, and no involuntary movements noted. Tone:    Normal muscle tone.    Posture:    Posture is normal. normal erect    Strength: LE weakness due to pain but otherwise intact.      Sensation: intact to LT     Reflex Exam:  DTR's:    Deep tendon reflexes in the upper and lower extremities are normal bilaterally.   Toes:    The toes are equivocal bilaterally.   Clonus:  Clonus is absent.      Assessment/Plan:  Chronic daily headaches due to medication overuse  I had a long discussion with patient and daughter that her daily use Tylenol every 8 hours for a decade can cause medication overuse/rebound headache which is likely causing her chronic daily headaches. The only thing to do is to stop the medication unfortunately. In the timeframe after stopping, her headaches may get much worse. I will give her some medication to try to bridge that. She should significantly  improve with her chronic daily headaches after 2-4 weeks of being off her daily over-the-counter medications. Do not use these medications more than 2 times in a week.  Migraines: Discussed migraine management including acute management and preventative management. We'll start patient on preventative and hold of fon acute management because at this time she has daily continuous headaches.. Discussed Depakote side effects    Remember to drink plenty of fluid, eat healthy meals and do not skip any meals. Try to eat protein with a every meal and eat a healthy snack such as fruit or nuts in between meals. Try to keep a regular sleep-wake schedule and try to exercise daily, particularly in the form of walking, 20-30 minutes a day, if you can.   As far as your medications are concerned, I would like to suggest: - Stop  daily over the counter med use (Tylenol, excedrin, ibuprofen, alleve) Do not tak emore than 2-3x in one week - For the next 6 days take steroids (medrol)  .  As far as diagnostic testing: If headaches persist need MRI brain  Discussed; To prevent or relieve headaches, try the following:  Cool Compress. Lie down and place a cool compress on your head.   Avoid headache triggers. If certain foods or odors seem to have triggered your migraines in the past, avoid them. A headache diary might help you identify triggers.   Include physical activity in your daily routine. Try a daily walk or other moderate aerobic exercise.   Manage stress. Find healthy ways to cope with the stressors, such as delegating tasks on your to-do list.   Practice relaxation techniques. Try deep breathing, yoga, massage and visualization.   Eat regularly. Eating regularly scheduled meals and maintaining a healthy diet might help prevent headaches. Also, drink plenty of fluids.   Follow a regular sleep schedule. Sleep deprivation might contribute to headaches  Consider biofeedback. With this mind-body technique, you learn to control certain bodily functions - such as muscle tension, heart rate and blood pressure - to prevent headaches or reduce headache pain.    Proceed to emergency room if you experience new or worsening symptoms or symptoms do not resolve, if you have new neurologic symptoms or if headache is severe, or for any concerning symptom.     Sarina Ill, MD  [facility] [address] [place], [place] [address]  Phone [phone] Fax [phone]

## 2016-07-02 ENCOUNTER — Ambulatory Visit: Payer: Medicaid Other | Attending: Family Medicine | Admitting: Family Medicine

## 2016-07-02 ENCOUNTER — Encounter: Payer: Self-pay | Admitting: Family Medicine

## 2016-07-02 VITALS — BP 123/77 | HR 74 | Temp 98.0°F | Wt 150.8 lb

## 2016-07-02 DIAGNOSIS — M25561 Pain in right knee: Secondary | ICD-10-CM | POA: Diagnosis not present

## 2016-07-02 DIAGNOSIS — Z7984 Long term (current) use of oral hypoglycemic drugs: Secondary | ICD-10-CM | POA: Insufficient documentation

## 2016-07-02 DIAGNOSIS — F17200 Nicotine dependence, unspecified, uncomplicated: Secondary | ICD-10-CM | POA: Diagnosis not present

## 2016-07-02 DIAGNOSIS — G8929 Other chronic pain: Secondary | ICD-10-CM | POA: Diagnosis present

## 2016-07-02 DIAGNOSIS — Z79899 Other long term (current) drug therapy: Secondary | ICD-10-CM | POA: Diagnosis not present

## 2016-07-02 DIAGNOSIS — Z7982 Long term (current) use of aspirin: Secondary | ICD-10-CM | POA: Diagnosis not present

## 2016-07-02 DIAGNOSIS — R1310 Dysphagia, unspecified: Secondary | ICD-10-CM | POA: Insufficient documentation

## 2016-07-02 DIAGNOSIS — M25562 Pain in left knee: Secondary | ICD-10-CM

## 2016-07-02 MED ORDER — METHYLPREDNISOLONE ACETATE 40 MG/ML IJ SUSP
40.0000 mg | Freq: Once | INTRAMUSCULAR | Status: AC
  Administered 2016-07-02: 40 mg via INTRA_ARTICULAR

## 2016-07-02 NOTE — Patient Instructions (Addendum)
April Mcmahon was seen today for knee pain.  Diagnoses and all orders for this visit:  Bilateral chronic knee pain -     methylPREDNISolone acetate (DEPO-MEDROL) injection 40 mg; Inject 1 mL (40 mg total) into the articular space once. -     methylPREDNISolone acetate (DEPO-MEDROL) injection 40 mg; Inject 1 mL (40 mg total) into the articular space once.   You have received a shot of steroid in your knees today. Rest and ice knee today. Regular activity tomorrow. Look out for redness, swelling, fever,severe pain in joint and call if you experience these symptoms.  F/u in 2-3 weeks for pap smear   Dr. Adrian Blackwater

## 2016-07-02 NOTE — Progress Notes (Signed)
Subjective:  Patient ID: April Mcmahon, female    DOB: 09-Dec-1960  Age: 56 y.o. MRN: [id number]  CC: Knee Pain   April Mcmahon presents with her daughter for    1. Knee pain:  she had a fall last month and hit her R knee. Her fall occurred in the rest room. She was dizzy. She had swelling which has subsided. She still has pain in her R knee. Massage helps a bit. Her current pain level is 8/10. She has had negative x-ray of her R knee at urgent care. She takes tylenol #3 that does improve her pain.   She has known chronic bilateral knee pain: x one years. 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 She reports fatigue in knees when she walks. Pain is worse with walking. There is no swelling in knees. No injury to knees. The ibuprofen does help with knee pain when she takes it. She reports that ibuprofen makes her feel tired. R knee is >L knee with 8/10 pain.   2. Pain with swallowing x 3 months. She denies reflux. She is able to swallow liquid without difficulty. She has trouble swallowing solids. She denies fever, chills and weight loss. She had a normal swallow study on 06/21/16, 10 days ago.    3. Pap smear: she declines pap smear today.   Social History  Substance Use Topics  . Smoking status: Current Some Day Smoker  . Smokeless tobacco: Never Used  . Alcohol use No    Outpatient Medications Prior to Visit  Medication Sig Dispense Refill  . acetaminophen-codeine (TYLENOL #3) 300-30 MG tablet Take 1 tablet by mouth 2 (two) times daily. 60 tablet 0  . amitriptyline (ELAVIL) 100 MG tablet Take 1 tablet (100 mg total) by mouth at bedtime. 30 tablet 5  . aspirin 325 MG tablet Take 650 mg by mouth every 6 (six) hours as needed. For headache    . diclofenac sodium (VOLTAREN) 1 % GEL Apply 2 g topically 4 (four) times daily. Apply to feet 100 g 3  . divalproex (DEPAKOTE ER) 250 MG 24 hr tablet Take 1 tablet (250 mg total) by mouth at bedtime. 30 tablet 11  . ergocalciferol (DRISDOL)  50000 units capsule Take 1 capsule (50,000 Units total) by mouth once a week. 4 capsule 2  . fenofibrate 160 MG tablet Take 1 tablet (160 mg total) by mouth daily. For triglyucerides 30 tablet 11  . gabapentin (NEURONTIN) 100 MG capsule Take 1 capsule (100 mg total) by mouth 3 (three) times daily. 90 capsule 3  . hydrochlorothiazide (HYDRODIURIL) 12.5 MG tablet Take 1 tablet (12.5 mg total) by mouth daily. 30 tablet 2  . lisinopril (PRINIVIL,ZESTRIL) 40 MG tablet Take 1 tablet (40 mg total) by mouth daily. 30 tablet 11  . metFORMIN (GLUCOPHAGE) 1000 MG tablet Take 1 tablet (1,000 mg total) by mouth 2 (two) times daily with a meal. 60 tablet 11  . methylPREDNISolone (MEDROL DOSEPAK) 4 MG TBPK tablet follow package directions 21 tablet 1  . pantoprazole (PROTONIX) 40 MG tablet Take 1 tablet (40 mg total) by mouth daily. 30 tablet 0   No facility-administered medications prior to visit.     ROS Review of Systems  Constitutional: Negative for chills and fever.  Eyes: Negative for visual disturbance.  Respiratory: Negative for shortness of breath.   Cardiovascular: Negative for chest pain and palpitations.  Gastrointestinal: Negative for abdominal pain and blood in stool.  Genitourinary: Negative for menstrual problem and  vaginal bleeding.  Musculoskeletal: Positive for arthralgias (b/l knee pain x 1 year ) and back pain.  Skin: Negative for rash.  Allergic/Immunologic: Negative for immunocompromised state.  Neurological: Positive for headaches (x 30 years ).  Hematological: Negative for adenopathy. Does not bruise/bleed easily.  Psychiatric/Behavioral: Negative for dysphoric mood and suicidal ideas.    Objective:  BP 123/77   Pulse 74   Temp 98 F (36.7 C) (Oral)   Wt 150 lb 12.8 oz (68.4 kg)   SpO2 98%   BMI 30.46 kg/m   BP/Weight 07/02/2016 06/24/2016 5/39/7673  Systolic BP 419 379 024  Diastolic BP 77 55 69  Wt. (Lbs) 150.8 149 148.4  BMI 30.46 30.09 29.97   Physical Exam    Constitutional: She is oriented to person, place, and time. She appears well-developed and well-nourished. No distress.  HENT:  Head: Normocephalic and atraumatic.  Cardiovascular: Normal rate, regular rhythm, normal heart sounds and intact distal pulses.   Pulmonary/Chest: Effort normal and breath sounds normal.  Musculoskeletal: She exhibits no edema.       Feet:  Neurological: She is alert and oriented to person, place, and time. She displays normal reflexes. No cranial nerve deficit. She exhibits normal muscle tone. Coordination normal.  Skin: Skin is warm and dry. No rash noted.  Psychiatric: She has a normal mood and affect.   Lab Results  Component Value Date   HGBA1C 6.4 04/16/2016   CBG 253   After obtaining informed consent and cleaning the skin using iodine and alcohol a  steroid injection was performed at R and L knees.  Using 4:1 ,  2% plain Lidocaine without epinephrine and 40 mg/ml of Depo Medrol. This was well tolerated.   Assessment & Plan:   April Mcmahon was seen today for knee pain.  Diagnoses and all orders for this visit:  Bilateral chronic knee pain -     methylPREDNISolone acetate (DEPO-MEDROL) injection 40 mg; Inject 1 mL (40 mg total) into the articular space once. -     methylPREDNISolone acetate (DEPO-MEDROL) injection 40 mg; Inject 1 mL (40 mg total) into the articular space once.   No orders of the defined types were placed in this encounter.   Follow-up: Return in about 2 weeks (around 07/16/2016) for pap smear.   Boykin Nearing MD   screenign HIV anf hep

## 2016-07-05 ENCOUNTER — Other Ambulatory Visit: Payer: Self-pay | Admitting: Pharmacist

## 2016-07-05 MED ORDER — VOLTAREN 1 % TD GEL: 2.0000 g | Freq: Four times a day (QID) | TRANSDERMAL | 0 refills | Status: DC

## 2016-07-05 MED FILL — VOLTAREN 1% GEL: 1 | 12 days supply | Qty: 100 | Fill #0

## 2016-07-05 NOTE — Telephone Encounter (Signed)
Patient with Medicaid - preferred is brand name Voltaren. Diclofenac gel rewritten as DAW Voltaren gel.

## 2016-07-06 NOTE — Assessment & Plan Note (Signed)
A: chronic pain from osteoarthritis P: Steroid injections done in both knees today

## 2016-07-16 ENCOUNTER — Other Ambulatory Visit (HOSPITAL_COMMUNITY)
Admission: RE | Admit: 2016-07-16 | Discharge: 2016-07-16 | Disposition: A | Discharge status: Home / Self Care | Payer: Medicaid Other | Source: Ambulatory Visit | Attending: Family Medicine | Admitting: Family Medicine

## 2016-07-16 ENCOUNTER — Encounter: Payer: Self-pay | Admitting: Family Medicine

## 2016-07-16 ENCOUNTER — Ambulatory Visit: Payer: Medicaid Other | Attending: Family Medicine | Admitting: Family Medicine

## 2016-07-16 VITALS — BP 120/76 | HR 77 | Temp 97.7°F | Resp 17 | Ht 59.0 in | Wt 150.0 lb

## 2016-07-16 DIAGNOSIS — Z124 Encounter for screening for malignant neoplasm of cervix: Secondary | ICD-10-CM | POA: Insufficient documentation

## 2016-07-16 DIAGNOSIS — N921 Excessive and frequent menstruation with irregular cycle: Secondary | ICD-10-CM | POA: Diagnosis not present

## 2016-07-16 DIAGNOSIS — E1121 Type 2 diabetes mellitus with diabetic nephropathy: Secondary | ICD-10-CM | POA: Diagnosis not present

## 2016-07-16 LAB — GLUCOSE, POCT (MANUAL RESULT ENTRY): POC Glucose: 130 mg/dl — AB (ref 70–99)

## 2016-07-16 NOTE — Patient Instructions (Addendum)
April Mcmahon was seen today for gynecologic exam.  Diagnoses and all orders for this visit:  Pap smear for cervical cancer screening -     Cytology - PAP  Controlled type 2 diabetes mellitus with diabetic nephropathy, without long-term current use of insulin ([place]) -     POCT glucose (manual entry)  Menorrhagia with irregular cycle -     US Pelvis Complete; Future -     US Transvaginal Non-OB; Future -     TSH   You will be called with pap and ultrasound results Ultrasound is to check for cause of heavy bleeding like a fibroid   F/u in 3 months for HTN and diabetes  Dr. Adrian Blackwater

## 2016-07-16 NOTE — Progress Notes (Signed)
Subjective:  Patient ID: Grady Lucci, female    DOB: 1960-07-14  Age: 56 y.o. MRN: [id number]  CC: Gynecologic Exam   HPI Caroljean Monsivais presents for   1. Screening pap smear: no previous pap. Denies vaginal discharge. Reports heavy menses. Menses last 7 days. Heavy first 4 days of period. Also has vaginal odor.   Patient daughter request thyroid check.   Social History  Substance Use Topics  . Smoking status: Current Some Day Smoker  . Smokeless tobacco: Never Used  . Alcohol use No    Outpatient Medications Prior to Visit  Medication Sig Dispense Refill  . acetaminophen-codeine (TYLENOL #3) 300-30 MG tablet Take 1 tablet by mouth 2 (two) times daily. 60 tablet 0  . amitriptyline (ELAVIL) 100 MG tablet Take 1 tablet (100 mg total) by mouth at bedtime. 30 tablet 5  . divalproex (DEPAKOTE ER) 250 MG 24 hr tablet Take 1 tablet (250 mg total) by mouth at bedtime. 30 tablet 11  . ergocalciferol (DRISDOL) 50000 units capsule Take 1 capsule (50,000 Units total) by mouth once a week. 4 capsule 2  . gabapentin (NEURONTIN) 100 MG capsule Take 1 capsule (100 mg total) by mouth 3 (three) times daily. 90 capsule 3  . hydrochlorothiazide (HYDRODIURIL) 12.5 MG tablet Take 1 tablet (12.5 mg total) by mouth daily. 30 tablet 2  . lisinopril (PRINIVIL,ZESTRIL) 40 MG tablet Take 1 tablet (40 mg total) by mouth daily. 30 tablet 11  . metFORMIN (GLUCOPHAGE) 1000 MG tablet Take 1 tablet (1,000 mg total) by mouth 2 (two) times daily with a meal. 60 tablet 11  . methylPREDNISolone (MEDROL DOSEPAK) 4 MG TBPK tablet follow package directions 21 tablet 1  . pantoprazole (PROTONIX) 40 MG tablet Take 1 tablet (40 mg total) by mouth daily. 30 tablet 0  . VOLTAREN 1 % GEL Apply 2 g topically 4 (four) times daily. 100 g 0  . aspirin 325 MG tablet Take 650 mg by mouth every 6 (six) hours as needed. For headache    . fenofibrate 160 MG tablet Take 1 tablet (160 mg total) by mouth daily. For triglyucerides (Patient not  taking: Reported on 07/16/2016) 30 tablet 11   No facility-administered medications prior to visit.     ROS Review of Systems  Constitutional: Negative for chills and fever.  Eyes: Negative for visual disturbance.  Respiratory: Negative for shortness of breath.   Cardiovascular: Negative for chest pain and palpitations.  Gastrointestinal: Negative for abdominal pain and blood in stool.  Genitourinary: Positive for menstrual problem. Negative for vaginal bleeding.  Musculoskeletal: Positive for arthralgias (b/l knee pain x 1 year ) and back pain.  Skin: Negative for rash.  Allergic/Immunologic: Negative for immunocompromised state.  Neurological: Positive for headaches (x 30 years ).  Hematological: Negative for adenopathy. Does not bruise/bleed easily.  Psychiatric/Behavioral: Negative for dysphoric mood and suicidal ideas.    Objective:  BP 120/76 (BP Location: Right Arm, Patient Position: Sitting, Cuff Size: Large)   Pulse 77   Temp 97.7 F (36.5 C) (Oral)   Resp 17   Ht 4\' 11"  (1.499 m)   Wt 150 lb (68 kg)   LMP 07/02/2016   SpO2 99%   BMI 30.30 kg/m   BP/Weight 07/16/2016 07/02/2016 5/36/1443  Systolic BP 154 008 676  Diastolic BP 76 77 55  Wt. (Lbs) 150 150.8 149  BMI 30.3 30.46 30.09   Physical Exam  Constitutional: She appears well-developed and well-nourished. No distress.  Cardiovascular: Normal rate, regular rhythm,  normal heart sounds and intact distal pulses.   Pulmonary/Chest: Effort normal and breath sounds normal.  Genitourinary: Vagina normal. Pelvic exam was performed with patient prone. There is no rash, tenderness or lesion on the right labia. There is no rash, tenderness or lesion on the left labia. Uterus is enlarged. Cervix exhibits no motion tenderness, no discharge and no friability.    Musculoskeletal: She exhibits no edema.  Lymphadenopathy:       Right: No inguinal adenopathy present.       Left: No inguinal adenopathy present.  Skin: Skin is  warm and dry. No rash noted.     Lab Results  Component Value Date   HGBA1C 6.4 04/16/2016   CBG 130 Assessment & Plan:  Cionna was seen today for gynecologic exam.  Diagnoses and all orders for this visit:  Pap smear for cervical cancer screening -     Cytology - PAP  Controlled type 2 diabetes mellitus with diabetic nephropathy, without long-term current use of insulin ([place]) -     POCT glucose (manual entry)  Menorrhagia with irregular cycle -     US Pelvis Complete; Future -     US Transvaginal Non-OB; Future -     TSH   There are no diagnoses linked to this encounter.  No orders of the defined types were placed in this encounter.   Follow-up: Return in about 3 months (around 10/16/2016) for HTN and DM2.   Boykin Nearing MD

## 2016-07-17 DIAGNOSIS — N921 Excessive and frequent menstruation with irregular cycle: Secondary | ICD-10-CM | POA: Insufficient documentation

## 2016-07-17 LAB — TSH: TSH: 2.48 u[IU]/mL (ref 0.450–4.500)

## 2016-07-17 NOTE — Assessment & Plan Note (Signed)
Pelvic and TVUS ordered Suspect uterine fibroids

## 2016-07-21 LAB — CYTOLOGY - PAP
Diagnosis: NEGATIVE
HPV: NOT DETECTED

## 2016-07-21 LAB — CERVICOVAGINAL ANCILLARY ONLY
CANDIDA VAGINITIS: NEGATIVE
CHLAMYDIA, DNA PROBE: NEGATIVE
Neisseria Gonorrhea: NEGATIVE
TRICH (WINDOWPATH): NEGATIVE

## 2016-07-22 ENCOUNTER — Encounter: Payer: Self-pay | Admitting: Family Medicine

## 2016-07-23 ENCOUNTER — Ambulatory Visit (HOSPITAL_COMMUNITY): Payer: Medicaid Other

## 2016-08-02 ENCOUNTER — Telehealth: Payer: Self-pay

## 2016-08-02 NOTE — Telephone Encounter (Signed)
Pt was called and informed of lab results([id number])

## 2016-08-17 ENCOUNTER — Ambulatory Visit (INDEPENDENT_AMBULATORY_CARE_PROVIDER_SITE_OTHER): Payer: Medicaid Other | Admitting: Podiatry

## 2016-08-17 ENCOUNTER — Encounter: Payer: Self-pay | Admitting: Podiatry

## 2016-08-17 DIAGNOSIS — M722 Plantar fascial fibromatosis: Secondary | ICD-10-CM | POA: Diagnosis not present

## 2016-08-18 NOTE — Progress Notes (Signed)
She presents today for follow-up of her plantar fasciitis. She states that she is doing better particularly the right the left is still painful. The medication seems to be helping for the burning and tingling of her feet.  Objective: Pulses remain palpable no open lesions or wounds. She has pain on palpation medial calcaneal tubercle of the left heel only.  Assessment: Well-healing plantar fasciitis right. Residual plantar fasciitis left.  Plan: Reinjected the left heel today with local anesthetic. She will continue the use of the gabapentin as prescribed

## 2016-08-29 ENCOUNTER — Encounter: Payer: Self-pay | Admitting: Internal Medicine

## 2016-08-30 ENCOUNTER — Ambulatory Visit: Payer: Medicaid Other | Attending: Internal Medicine | Admitting: Internal Medicine

## 2016-08-30 ENCOUNTER — Encounter: Payer: Self-pay | Admitting: Internal Medicine

## 2016-08-30 VITALS — BP 137/78 | HR 65 | Temp 98.2°F | Resp 16 | Wt 152.4 lb

## 2016-08-30 DIAGNOSIS — Z79899 Other long term (current) drug therapy: Secondary | ICD-10-CM | POA: Diagnosis not present

## 2016-08-30 DIAGNOSIS — Z7984 Long term (current) use of oral hypoglycemic drugs: Secondary | ICD-10-CM | POA: Diagnosis not present

## 2016-08-30 DIAGNOSIS — E785 Hyperlipidemia, unspecified: Secondary | ICD-10-CM | POA: Insufficient documentation

## 2016-08-30 DIAGNOSIS — M722 Plantar fascial fibromatosis: Secondary | ICD-10-CM | POA: Diagnosis not present

## 2016-08-30 DIAGNOSIS — E559 Vitamin D deficiency, unspecified: Secondary | ICD-10-CM | POA: Diagnosis not present

## 2016-08-30 DIAGNOSIS — N920 Excessive and frequent menstruation with regular cycle: Secondary | ICD-10-CM | POA: Diagnosis present

## 2016-08-30 DIAGNOSIS — I10 Essential (primary) hypertension: Secondary | ICD-10-CM | POA: Diagnosis not present

## 2016-08-30 DIAGNOSIS — N938 Other specified abnormal uterine and vaginal bleeding: Secondary | ICD-10-CM | POA: Diagnosis not present

## 2016-08-30 DIAGNOSIS — E119 Type 2 diabetes mellitus without complications: Secondary | ICD-10-CM | POA: Diagnosis not present

## 2016-08-30 DIAGNOSIS — G43909 Migraine, unspecified, not intractable, without status migrainosus: Secondary | ICD-10-CM | POA: Insufficient documentation

## 2016-08-30 DIAGNOSIS — F17200 Nicotine dependence, unspecified, uncomplicated: Secondary | ICD-10-CM

## 2016-08-30 DIAGNOSIS — Z1211 Encounter for screening for malignant neoplasm of colon: Secondary | ICD-10-CM | POA: Diagnosis not present

## 2016-08-30 DIAGNOSIS — Z7982 Long term (current) use of aspirin: Secondary | ICD-10-CM | POA: Insufficient documentation

## 2016-08-30 DIAGNOSIS — F17210 Nicotine dependence, cigarettes, uncomplicated: Secondary | ICD-10-CM | POA: Insufficient documentation

## 2016-08-30 DIAGNOSIS — E1121 Type 2 diabetes mellitus with diabetic nephropathy: Secondary | ICD-10-CM

## 2016-08-30 NOTE — Progress Notes (Signed)
Patient ID: April Mcmahon, female    DOB: 16-May-1960  MRN: [id number]  CC: re-establish; Knee Pain; and Migraine   Subjective: April Mcmahon is a 56 y.o. female who presents for. [place]. Daughter-in-law, Enis Gash, is with her and interprets Her concerns today include:  Pt with hx of DM, HTN, HL, tob dep, migraines, Vit D def, DJD knees and plantar fasciitis.  1. Menorrhagia:  Seen by Funches last mth and pelvic US ordered but missed appt. PAP was negative. TSH nl -last menses started 08/01/2016 and bled until 3 days ago.  Heavy bleeding. Had to change pad 4-5 x a day -usual menses last 7-9 days -not sexually active  2. DM -compliant with Metformin -does not check BS Doing ok with diet Walks in a.m and p.m for 1/2 hr -due for eye exam. Last eye exam 2014. No blurred vision  3. Tob: Smoking 1-2 cig a day. Finds it difficult to quit completely  4. HTN compliant with med and salt restriction   Patient Active Problem List   Diagnosis Date Noted  . Menorrhagia with irregular cycle 07/17/2016  . Analgesic rebound headache 06/24/2016  . Esophageal dysphagia 06/21/2016  . Tobacco dependence 01/20/2016  . Plantar fasciitis, bilateral 12/24/2015  . 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 Diabetes type 2, controlled ([place]) 06/19/2015  . Essential hypertension 06/19/2015  . Vitamin D deficiency 09/24/2013  . Hyperlipidemia 09/24/2013  . Migraine 05/15/2013  . Low back pain 08/18/2012     Current Outpatient Prescriptions on File Prior to Visit  Medication Sig Dispense Refill  . acetaminophen-codeine (TYLENOL #3) 300-30 MG tablet Take 1 tablet by mouth 2 (two) times daily. 60 tablet 0  . amitriptyline (ELAVIL) 100 MG tablet Take 1 tablet (100 mg total) by mouth at bedtime. 30 tablet 5  . aspirin 325 MG tablet Take 650 mg by mouth every 6 (six) hours as needed. For headache    . divalproex (DEPAKOTE ER) 250 MG 24 hr tablet Take 1 tablet (250 mg total) by mouth at bedtime.  30 tablet 11  . ergocalciferol (DRISDOL) 50000 units capsule Take 1 capsule (50,000 Units total) by mouth once a week. 4 capsule 2  . gabapentin (NEURONTIN) 100 MG capsule Take 1 capsule (100 mg total) by mouth 3 (three) times daily. 90 capsule 3  . hydrochlorothiazide (HYDRODIURIL) 12.5 MG tablet Take 1 tablet (12.5 mg total) by mouth daily. 30 tablet 2  . lisinopril (PRINIVIL,ZESTRIL) 40 MG tablet Take 1 tablet (40 mg total) by mouth daily. 30 tablet 11  . metFORMIN (GLUCOPHAGE) 1000 MG tablet Take 1 tablet (1,000 mg total) by mouth 2 (two) times daily with a meal. 60 tablet 11  . pantoprazole (PROTONIX) 40 MG tablet Take 1 tablet (40 mg total) by mouth daily. 30 tablet 0  . SUMAtriptan (IMITREX) 100 MG tablet TAKE 1 TABLET EVERY 2 HOURS AS NEEDED FOR MIGRAINE, REPEAT IN 2 HRS IF PERSISTS OR RECURS  4  . VOLTAREN 1 % GEL Apply 2 g topically 4 (four) times daily. 100 g 0   No current facility-administered medications on file prior to visit.     No Known Allergies  Social History   Social History  . Marital status: Married    Spouse name: N/A  . Number of children: N/A  . Years of education: N/A   Occupational History  . Not on file.   Social History Main Topics  . Smoking status: Current Some Day Smoker  . Smokeless tobacco:  Never Used  . Alcohol use No  . Drug use: No  . Sexual activity: Not on file   Other Topics Concern  . Not on file   Social History Narrative  . No narrative on file    Family History  Problem Relation Age of Onset  . Migraines Mother   . Breast cancer Neg Hx     Past Surgical History:  Procedure Laterality Date  . no previous surgeries      ROS: Review of Systems Neg except as stated above PHYSICAL EXAM: BP 137/78   Pulse 65   Temp 98.2 F (36.8 C) (Oral)   Resp 16   Wt 152 lb 6.4 oz (69.1 kg)   SpO2 99%   BMI 30.78 kg/m   Physical Exam General appearance - alert, well appearing, and in no distress Mental status - alert,  oriented to person, place, and time, normal mood, behavior, speech, dress, motor activity, and thought processes Mouth - mucous membranes moist, pharynx normal without lesions Neck - supple, no significant adenopathy Chest - clear to auscultation, no wheezes, rales or rhonchi, symmetric air entry Heart - normal rate, regular rhythm, normal S1, S2, no murmurs, rubs, clicks or gallops Extremities - peripheral pulses normal, no pedal edema, no clubbing or cyanosis   Depression screen PHQ 2/9 08/30/2016  Decreased Interest 1  Down, Depressed, Hopeless 1  PHQ - 2 Score 2  Altered sleeping 2  Tired, decreased energy 3  Change in appetite 1  Feeling bad or failure about yourself  2  Trouble concentrating 0  Moving slowly or fidgety/restless (No Data)  Suicidal thoughts 1  PHQ-9 Score 11   GAD 7 : Generalized Anxiety Score 08/30/2016 06/21/2016 04/16/2016  Nervous, Anxious, on Edge 0 1 2  Control/stop worrying 1 1 3   Worry too much - different things 3 1 3   Trouble relaxing 1 1 3   Restless 3 1 3   Easily annoyed or irritable 1 2 1   Afraid - awful might happen 3 1 2   Total GAD 7 Score 12 8 17     Lab Results  Component Value Date   HGBA1C 6.4 04/16/2016    ASSESSMENT AND PLAN: 1. Controlled type 2 diabetes mellitus without complication, without long-term current use of insulin (HCC) Continue healthy eating habit and regular exercise. Continue metformin. - POCT glucose (manual entry) - Hemoglobin A1c; Future - Ambulatory referral to Ophthalmology  2. Tobacco dependence Patient advised to quit smoking. Discussed health risks associated with smoking including lung and other types of cancers, chronic lung diseases and CV risks.. Pt not ready to give trail of quitting but was interested in learning about methods to help quit smoking. She will look into purchasing nicotine gum over-the-counter.  3. Essential hypertension At goal. Continue lisinopril/HCTZ  4. Dysfunctional uterine  bleeding -CMA called and rescheduled appointment for pelvic ultrasound - CBC; Future - Ambulatory referral to Gynecology  5. Colon cancer screening - Ambulatory referral to Gastroenterology   Patient was given the opportunity to ask questions.  Patient verbalized understanding of the plan and was able to repeat key elements of the plan.   Orders Placed This Encounter  Procedures  . CBC  . Hemoglobin A1c  . Ambulatory referral to Gynecology  . Ambulatory referral to Gastroenterology  . Ambulatory referral to Ophthalmology  . POCT glucose (manual entry)     Requested Prescriptions    No prescriptions requested or ordered in this encounter    Return in about 4 months (around  12/31/2016).  Karle Plumber, MD, FACP

## 2016-08-30 NOTE — Patient Instructions (Signed)
You have been referred to gynecologist for abnormal vaginal bleeding.   You have been referred for colonoscopy and eye exam.

## 2016-08-31 ENCOUNTER — Ambulatory Visit: Payer: Medicaid Other | Attending: Internal Medicine

## 2016-08-31 DIAGNOSIS — E119 Type 2 diabetes mellitus without complications: Secondary | ICD-10-CM

## 2016-08-31 DIAGNOSIS — N938 Other specified abnormal uterine and vaginal bleeding: Secondary | ICD-10-CM

## 2016-08-31 LAB — GLUCOSE, POCT (MANUAL RESULT ENTRY): POC Glucose: 193 mg/dl — AB (ref 70–99)

## 2016-09-01 LAB — CBC
HEMATOCRIT: 36.9 % (ref 34.0–46.6)
Hemoglobin: 11.8 g/dL (ref 11.1–15.9)
MCH: 27.2 pg (ref 26.6–33.0)
MCHC: 32 g/dL (ref 31.5–35.7)
MCV: 85 fL (ref 79–97)
PLATELETS: 238 10*3/uL (ref 150–379)
RBC: 4.34 x10E6/uL (ref 3.77–5.28)
RDW: 15.1 % (ref 12.3–15.4)
WBC: 8.1 10*3/uL (ref 3.4–10.8)

## 2016-09-01 LAB — HEMOGLOBIN A1C
Est. average glucose Bld gHb Est-mCnc: 134 mg/dL
Hgb A1c MFr Bld: 6.3 % — ABNORMAL HIGH (ref 4.8–5.6)

## 2016-09-03 ENCOUNTER — Ambulatory Visit (HOSPITAL_COMMUNITY)
Admission: RE | Admit: 2016-09-03 | Discharge: 2016-09-03 | Disposition: A | Discharge status: Home / Self Care | Payer: Medicaid Other | Source: Ambulatory Visit | Attending: Family Medicine | Admitting: Family Medicine

## 2016-09-03 DIAGNOSIS — N921 Excessive and frequent menstruation with irregular cycle: Secondary | ICD-10-CM | POA: Diagnosis not present

## 2016-09-16 ENCOUNTER — Telehealth: Payer: Self-pay

## 2016-09-16 NOTE — Telephone Encounter (Signed)
Pt was called and informed of lab results. 

## 2016-10-06 ENCOUNTER — Ambulatory Visit: Payer: Medicaid Other | Admitting: Adult Health

## 2016-10-07 ENCOUNTER — Encounter: Payer: Self-pay | Admitting: Adult Health

## 2016-10-13 ENCOUNTER — Encounter: Payer: Self-pay | Admitting: Physician Assistant

## 2016-10-13 ENCOUNTER — Ambulatory Visit: Payer: Medicaid Other | Attending: Internal Medicine | Admitting: Physician Assistant

## 2016-10-13 VITALS — BP 130/78 | HR 93 | Temp 98.0°F | Resp 18 | Ht 60.0 in | Wt 152.6 lb

## 2016-10-13 DIAGNOSIS — M25561 Pain in right knee: Secondary | ICD-10-CM

## 2016-10-13 DIAGNOSIS — M1990 Unspecified osteoarthritis, unspecified site: Secondary | ICD-10-CM | POA: Insufficient documentation

## 2016-10-13 DIAGNOSIS — Z79899 Other long term (current) drug therapy: Secondary | ICD-10-CM | POA: Insufficient documentation

## 2016-10-13 DIAGNOSIS — G8929 Other chronic pain: Secondary | ICD-10-CM | POA: Diagnosis not present

## 2016-10-13 DIAGNOSIS — E119 Type 2 diabetes mellitus without complications: Secondary | ICD-10-CM | POA: Diagnosis not present

## 2016-10-13 DIAGNOSIS — F99 Mental disorder, not otherwise specified: Secondary | ICD-10-CM

## 2016-10-13 DIAGNOSIS — M79672 Pain in left foot: Secondary | ICD-10-CM

## 2016-10-13 DIAGNOSIS — I10 Essential (primary) hypertension: Secondary | ICD-10-CM | POA: Diagnosis not present

## 2016-10-13 DIAGNOSIS — Z7984 Long term (current) use of oral hypoglycemic drugs: Secondary | ICD-10-CM | POA: Insufficient documentation

## 2016-10-13 DIAGNOSIS — G43009 Migraine without aura, not intractable, without status migrainosus: Secondary | ICD-10-CM | POA: Diagnosis not present

## 2016-10-13 DIAGNOSIS — Z7982 Long term (current) use of aspirin: Secondary | ICD-10-CM | POA: Insufficient documentation

## 2016-10-13 LAB — GLUCOSE, POCT (MANUAL RESULT ENTRY): POC GLUCOSE: 187 mg/dL — AB (ref 70–99)

## 2016-10-13 MED ORDER — NAPROXEN 500 MG PO TABS: 500.0000 mg | ORAL_TABLET | Freq: Two times a day (BID) | ORAL | 1 refills | Status: DC

## 2016-10-13 MED ORDER — FLUTICASONE PROPIONATE 50 MCG/ACT NA SUSP: 1.0000 | Freq: Every day | NASAL | 2 refills | Status: DC

## 2016-10-13 NOTE — Progress Notes (Signed)
Patient ID: April Mcmahon, female   DOB: Jan 22, 1961, 56 y.o.   MRN: [id number]   April Mcmahon, is a 56 y.o. female  KGY:[id number]  WYO:[id number]  DOB - [birth year]  Subjective:  Chief Complaint and HPI: April Mcmahon is a 56 y.o. female here today for several issues.   -R knee pain since a fall in 06/2016-xrays NAD Pain is medial; worse over last 3 days.  Tylenol not helping.  No new injury. -L lateral foot pain-NKI.  Pain worse with bearing weight.   -Headache daily X 1 month.  Long history with migraines and other HA types.  Tylenol and Imitrex not helping.  Headache is frontal and temporal.  Also admits to sinus pressure and congestion.  No N/V/photophobia/phonophobia.  CT head 02/2016 reviewed and was normal. -needs formal mental status exam per immigration lawyer. -not checking sugar regularly.  Takes meds "most" of the time.  Just ate lunch PTA. Interpreters refused.  Daughter translates.    Social History: lives with daughter, not working, applying for [name] citizenship/immigration ROS:   Constitutional:  No f/c, No night sweats, No unexplained weight loss. EENT:  No vision changes, No blurry vision, No hearing changes. No mouth, throat, or ear problems other than sinus congestion. Respiratory: No cough, No SOB Cardiac: No CP, no palpitations GI:  No abd pain, No N/V/D. GU: No Urinary s/sx Musculoskeletal: No joint pain Neuro: + headache, no dizziness, no motor weakness.  Skin: No rash Endocrine:  No polydipsia. No polyuria.  Psych: Denies SI/HI  No problems updated.  ALLERGIES: No Known Allergies  PAST MEDICAL HISTORY: Past Medical History:  Diagnosis Date  . Bilateral chronic knee pain 06/2014  . Chronic low back pain 06/2014  . Chronic migraine 02/1985  . Diabetes mellitus without complication ([place]) 77/4128  . Hypertension 09/2013    MEDICATIONS AT HOME: Prior to Admission medications   Medication Sig Start Date End Date Taking? Authorizing Provider  amitriptyline  (ELAVIL) 100 MG tablet Take 1 tablet (100 mg total) by mouth at bedtime. 05/21/16  Yes Funches, Adriana Mccallum, MD  divalproex (DEPAKOTE ER) 250 MG 24 hr tablet Take 1 tablet (250 mg total) by mouth at bedtime. 06/24/16  Yes Melvenia Beam, MD  gabapentin (NEURONTIN) 100 MG capsule Take 1 capsule (100 mg total) by mouth 3 (three) times daily. 06/17/16  Yes Hyatt, Max T, DPM  hydrochlorothiazide (HYDRODIURIL) 12.5 MG tablet Take 1 tablet (12.5 mg total) by mouth daily. 04/16/16  Yes Funches, Josalyn, MD  lisinopril (PRINIVIL,ZESTRIL) 40 MG tablet Take 1 tablet (40 mg total) by mouth daily. 12/23/15  Yes Funches, Adriana Mccallum, MD  metFORMIN (GLUCOPHAGE) 1000 MG tablet Take 1 tablet (1,000 mg total) by mouth 2 (two) times daily with a meal. 04/19/16  Yes Funches, Josalyn, MD  acetaminophen-codeine (TYLENOL #3) 300-30 MG tablet Take 1 tablet by mouth 2 (two) times daily. 06/21/16   Boykin Nearing, MD  aspirin 325 MG tablet Take 650 mg by mouth every 6 (six) hours as needed. For headache    [provider]  ergocalciferol (DRISDOL) 50000 units capsule Take 1 capsule (50,000 Units total) by mouth once a week. 01/20/16   Funches, Adriana Mccallum, MD  fluticasone (FLONASE) 50 MCG/ACT nasal spray Place 1 spray into both nostrils daily. 10/13/16   Argentina Donovan, PA-C  naproxen (NAPROSYN) 500 MG tablet Take 1 tablet (500 mg total) by mouth 2 (two) times daily with a meal. X 10 days then prn pain or headache 10/13/16   Argentina Donovan, PA-C  pantoprazole (PROTONIX) 40 MG tablet Take 1 tablet (40 mg total) by mouth daily. 06/21/16   Funches, Adriana Mccallum, MD  SUMAtriptan (IMITREX) 100 MG tablet TAKE 1 TABLET EVERY 2 HOURS AS NEEDED FOR MIGRAINE, REPEAT IN 2 HRS IF PERSISTS OR RECURS 06/29/16   [provider]  VOLTAREN 1 % GEL Apply 2 g topically 4 (four) times daily. 07/05/16   Funches, Adriana Mccallum, MD     Objective:  EXAM:   Vitals:   10/13/16 1423  BP: 130/78  Pulse: 93  Resp: 18  Temp: 98 F (36.7 C)  TempSrc:  Oral  SpO2: 97%  Weight: 152 lb 9.6 oz (69.2 kg)  Height: 5' (1.524 m)    General appearance : A&OX3. NAD. Non-toxic-appearing HEENT: Atraumatic and Normocephalic.  PERRLA. EOM intact.  TM full B w/o infection. Mouth-MMM, post pharynx WNL w/o erythema, No PND.  Turbinates pale, boggy, and enlarged Neck: supple, no JVD. No cervical lymphadenopathy. No thyromegaly Chest/Lungs:  Breathing-non-labored, Good air entry bilaterally, breath sounds normal without rales, rhonchi, or wheezing  CVS: S1 S2 regular, no murmurs, gallops, rubs  Extremities: Bilateral Lower Ext shows no edema, both legs are warm to touch with = pulse throughout.  No abnormality of R knee/laxity. No TTP.  Subjective pain over medial aspect of the knee.  l foot WNL.  +TTP lateral 5th metatarsal.  No gross abnormality or swelling. Neurology:  CN II-XII grossly intact, Non focal.  Heel to shin/finger to nose WNL.   Psych:  TP linear. J/I WNL. Normal speech. Appropriate eye contact and affect.  Skin:  No Rash  Data Review Lab Results  Component Value Date   HGBA1C 6.3 (H) 08/31/2016   HGBA1C 6.4 04/16/2016   HGBA1C 6.9 01/20/2016     Assessment & Plan   1. Controlled type 2 diabetes mellitus without complication, without long-term current use of insulin (HCC) Uncontrolled.  Work on diet and take meds as directed.   - Glucose (CBG)  2. Migraine without aura and without status migrainosus, not intractable vs sinus type HA which I believe is more likely in this scenario and based on exam - naproxen (NAPROSYN) 500 MG tablet; Take 1 tablet (500 mg total) by mouth 2 (two) times daily with a meal. X 10 days then prn pain or headache  Dispense: 60 tablet; Refill: 1 - fluticasone (FLONASE) 50 MCG/ACT nasal spray; Place 1 spray into both nostrils daily.  Dispense: 16 g; Refill: 2  3. Chronic pain of right knee With known DJD - naproxen (NAPROSYN) 500 MG tablet; Take 1 tablet (500 mg total) by mouth 2 (two) times daily with a  meal. X 10 days then prn pain or headache  Dispense: 60 tablet; Refill: 1  4. Left foot pain No gross abnormality - naproxen (NAPROSYN) 500 MG tablet; Take 1 tablet (500 mg total) by mouth 2 (two) times daily with a meal. X 10 days then prn pain or headache  Dispense: 60 tablet; Refill: 1  5. Abnormal mini-mental status exam Language barrier needs to be taken into account.  In addition, she is unable to read even in her native language. - Ambulatory referral to Neurology  Patient have been counseled extensively about nutrition and exercise  Return in about 7 weeks (around 12/01/2016) for assign PCP; follow up DM.  The patient was given clear instructions to go to ER or return to medical center if symptoms don't improve, worsen or new problems develop. The patient verbalized understanding. The patient was told to call  to get lab results if they haven't heard anything in the next week.     Freeman Caldron, PA-C [facility] [place], [place]   10/13/2016, 2:36 PM

## 2016-10-18 ENCOUNTER — Ambulatory Visit: Payer: Medicaid Other | Admitting: Internal Medicine

## 2016-10-25 ENCOUNTER — Encounter: Payer: Medicaid Other | Admitting: Obstetrics & Gynecology

## 2016-11-14 ENCOUNTER — Ambulatory Visit (HOSPITAL_COMMUNITY)
Admission: EM | Admit: 2016-11-14 | Discharge: 2016-11-14 | Disposition: A | Discharge status: Home / Self Care | Payer: Medicaid Other | Attending: Family Medicine | Admitting: Family Medicine

## 2016-11-14 ENCOUNTER — Encounter (HOSPITAL_COMMUNITY): Payer: Self-pay | Admitting: Family Medicine

## 2016-11-14 DIAGNOSIS — E0842 Diabetes mellitus due to underlying condition with diabetic polyneuropathy: Secondary | ICD-10-CM | POA: Diagnosis not present

## 2016-11-14 MED ORDER — GABAPENTIN 300 MG PO CAPS: 300.0000 mg | ORAL_CAPSULE | Freq: Two times a day (BID) | ORAL | 2 refills | Status: DC

## 2016-11-14 NOTE — ED Triage Notes (Signed)
Pt here for bilateral plantar pain onset 1 month  Pain increases w/activity  Denies inj/trauma  Has been to [facility] ED for similar sx  A&O x4... NAD... Ambulatory

## 2016-11-14 NOTE — Discharge Instructions (Signed)
Please follow up with your doctors in 2 weeks

## 2016-11-14 NOTE — ED Provider Notes (Signed)
[place]   [id number] 11/14/16 Arrival Time: 1622   SUBJECTIVE:  April Mcmahon is a 56 y.o. female who presents to the urgent care with complaint of bilateral plantar foot pain and some bilateral palmar pain.  She says the pain is burning in quality and present all the time, though worse with weight bearing.  She's had diabetes for five years.  She has other pains in her joints. Unemployed at present     Past Medical History:  Diagnosis Date  . Bilateral chronic knee pain 06/2014  . Chronic low back pain 06/2014  . Chronic migraine 02/1985  . Diabetes mellitus without complication ([place]) 34/7425  . Hypertension 09/2013   Family History  Problem Relation Age of Onset  . Migraines Mother   . Breast cancer Neg Hx    Social History   Social History  . Marital status: Married    Spouse name: N/A  . Number of children: N/A  . Years of education: N/A   Occupational History  . Not on file.   Social History Main Topics  . Smoking status: Current Some Day Smoker  . Smokeless tobacco: Never Used  . Alcohol use No  . Drug use: No  . Sexual activity: Not on file   Other Topics Concern  . Not on file   Social History Narrative  . No narrative on file   Current Meds  Medication Sig  . amitriptyline (ELAVIL) 100 MG tablet Take 1 tablet (100 mg total) by mouth at bedtime.  Marland Kitchen aspirin 325 MG tablet Take 650 mg by mouth every 6 (six) hours as needed. For headache  . divalproex (DEPAKOTE ER) 250 MG 24 hr tablet Take 1 tablet (250 mg total) by mouth at bedtime.  . ergocalciferol (DRISDOL) 50000 units capsule Take 1 capsule (50,000 Units total) by mouth once a week.  . fluticasone (FLONASE) 50 MCG/ACT nasal spray Place 1 spray into both nostrils daily.  . hydrochlorothiazide (HYDRODIURIL) 12.5 MG tablet Take 1 tablet (12.5 mg total) by mouth daily.  Marland Kitchen lisinopril (PRINIVIL,ZESTRIL) 40 MG tablet Take 1 tablet (40 mg total) by mouth daily.  . metFORMIN (GLUCOPHAGE)  1000 MG tablet Take 1 tablet (1,000 mg total) by mouth 2 (two) times daily with a meal.  . naproxen (NAPROSYN) 500 MG tablet Take 1 tablet (500 mg total) by mouth 2 (two) times daily with a meal. X 10 days then prn pain or headache  . pantoprazole (PROTONIX) 40 MG tablet Take 1 tablet (40 mg total) by mouth daily.  . [DISCONTINUED] gabapentin (NEURONTIN) 100 MG capsule Take 1 capsule (100 mg total) by mouth 3 (three) times daily.   No Known Allergies    ROS: As per HPI, remainder of ROS negative.   OBJECTIVE:   Vitals:   11/14/16 1637  BP: (!) 143/77  Pulse: 76  Resp: 20  Temp: 97.8 F (36.6 C)  TempSrc: Oral  SpO2: 100%     General appearance: alert; no distress Eyes: PERRL; EOMI; conjunctiva normal HENT: normocephalic; atraumatic;  external ears normal without trauma; nasal mucosa normal; oral mucosa normal Neck: supple Extremities: no cyanosis or edema; symmetrical with no gross deformities, good pedal pulses Skin: warm and dry, scaling feet that are dirty on the soles Neurologic: normal gait; grossly normal Psychological: alert and cooperative; normal mood and affect      Labs:  Results for orders placed or performed in visit on 10/13/16  Glucose (CBG)  Result Value Ref Range   POC Glucose  187 (A) 70 - 99 mg/dl    Labs Reviewed - No data to display  No results found.     ASSESSMENT & PLAN:  1. Diabetic polyneuropathy associated with diabetes mellitus due to underlying condition ([place])     Meds ordered this encounter  Medications  . gabapentin (NEURONTIN) 300 MG capsule    Sig: Take 1 capsule (300 mg total) by mouth 2 (two) times daily.    Dispense:  60 capsule    Refill:  2    Reviewed expectations re: course of current medical issues. Questions answered. Outlined signs and symptoms indicating need for more acute intervention. Patient verbalized understanding. After Visit Summary given.    Procedures:      Robyn Haber,  MD 11/14/16 1646

## 2016-11-29 ENCOUNTER — Other Ambulatory Visit (HOSPITAL_COMMUNITY)
Admission: RE | Admit: 2016-11-29 | Discharge: 2016-11-29 | Disposition: A | Discharge status: Home / Self Care | Payer: Medicaid Other | Source: Ambulatory Visit | Attending: Obstetrics & Gynecology | Admitting: Obstetrics & Gynecology

## 2016-11-29 ENCOUNTER — Ambulatory Visit (INDEPENDENT_AMBULATORY_CARE_PROVIDER_SITE_OTHER): Payer: Medicaid Other | Admitting: Obstetrics & Gynecology

## 2016-11-29 DIAGNOSIS — N938 Other specified abnormal uterine and vaginal bleeding: Secondary | ICD-10-CM | POA: Diagnosis present

## 2016-11-29 DIAGNOSIS — N398 Other specified disorders of urinary system: Secondary | ICD-10-CM | POA: Insufficient documentation

## 2016-11-29 DIAGNOSIS — Z3202 Encounter for pregnancy test, result negative: Secondary | ICD-10-CM | POA: Diagnosis not present

## 2016-11-29 NOTE — Progress Notes (Signed)
Patient ID: Kathelene Rumberger, female   DOB: [birth year], 56 y.o.   MRN: [id number]  No chief complaint on file.   HPI Brandye Inthavong is a 56 y.o. female.   HPI 56 yo Nepalese P6 with menarche at 56yo, here today with the issue of heavy, long, irregular periods. This has been going on for 3-4 months. She has skipped the last 2 periods. Past Medical History:  Diagnosis Date  . Bilateral chronic knee pain 06/2014  . Chronic low back pain 06/2014  . Chronic migraine 02/1985  . Diabetes mellitus without complication ([place]) 74/2595  . Hypertension 09/2013    PSH- BTL  Family History  Problem Relation Age of Onset  . Migraines Mother   . Breast cancer Neg Hx     Social History Social History  Substance Use Topics  . Smoking status: Current Some Day Smoker  . Smokeless tobacco: Never Used  . Alcohol use No    No Known Allergies  Current Outpatient Prescriptions  Medication Sig Dispense Refill  . acetaminophen-codeine (TYLENOL #3) 300-30 MG tablet Take 1 tablet by mouth 2 (two) times daily. 60 tablet 0  . amitriptyline (ELAVIL) 100 MG tablet Take 1 tablet (100 mg total) by mouth at bedtime. 30 tablet 5  . aspirin 325 MG tablet Take 650 mg by mouth every 6 (six) hours as needed. For headache    . divalproex (DEPAKOTE ER) 250 MG 24 hr tablet Take 1 tablet (250 mg total) by mouth at bedtime. 30 tablet 11  . ergocalciferol (DRISDOL) 50000 units capsule Take 1 capsule (50,000 Units total) by mouth once a week. 4 capsule 2  . fluticasone (FLONASE) 50 MCG/ACT nasal spray Place 1 spray into both nostrils daily. 16 g 2  . gabapentin (NEURONTIN) 300 MG capsule Take 1 capsule (300 mg total) by mouth 2 (two) times daily. 60 capsule 2  . hydrochlorothiazide (HYDRODIURIL) 12.5 MG tablet Take 1 tablet (12.5 mg total) by mouth daily. 30 tablet 2  . lisinopril (PRINIVIL,ZESTRIL) 40 MG tablet Take 1 tablet (40 mg total) by mouth daily. 30 tablet 11  . metFORMIN (GLUCOPHAGE) 1000 MG tablet Take 1 tablet  (1,000 mg total) by mouth 2 (two) times daily with a meal. 60 tablet 11  . naproxen (NAPROSYN) 500 MG tablet Take 1 tablet (500 mg total) by mouth 2 (two) times daily with a meal. X 10 days then prn pain or headache 60 tablet 1  . pantoprazole (PROTONIX) 40 MG tablet Take 1 tablet (40 mg total) by mouth daily. 30 tablet 0  . SUMAtriptan (IMITREX) 100 MG tablet TAKE 1 TABLET EVERY 2 HOURS AS NEEDED FOR MIGRAINE, REPEAT IN 2 HRS IF PERSISTS OR RECURS  4  . VOLTAREN 1 % GEL Apply 2 g topically 4 (four) times daily. 100 g 0   No current facility-administered medications for this visit.     Review of Systems Review of Systems She has had a mammogram, pap,  and flu vaccine recently There were no vitals taken for this visit.  Physical Exam Physical Exam  Interpretor present for exam Breathing, conversing, and ambulating normally Well nourished, obese, well hydrated Asian female, no apparent distress   Data Reviewed Thickness: 11 mm which is within normal limits for premenopausal patient. No focal abnormality visualized.  Right ovary  Measurements: 1.9 x 1.5 x 0.8 cm. Normal appearance/no adnexal mass.  Left ovary  Measurements: 3.1 x 2.4 x 2.5 cm. 2.1 cm simple follicular cyst is noted.  Other findings  No  abnormal free fluid.  IMPRESSION: No significant abnormality seen in the pelvis.  Assessment    DUB- probably perimenopause    Plan    Await [facility] pathology results Check [place]       Lateasha Breuer C Sohan Potvin 11/29/2016, 3:43 PM

## 2016-11-29 NOTE — Addendum Note (Signed)
Addended by: Clovia Cuff C on: 11/29/2016 04:00 PM   Modules accepted: Orders

## 2016-11-29 NOTE — Progress Notes (Signed)
Language Resources Bishnu Paudel

## 2016-11-30 LAB — FOLLICLE STIMULATING HORMONE: FSH: 46.5 m[IU]/mL

## 2016-11-30 LAB — POCT PREGNANCY, URINE: Preg Test, Ur: NEGATIVE

## 2016-12-10 ENCOUNTER — Ambulatory Visit: Payer: Medicaid Other | Admitting: Internal Medicine

## 2016-12-15 ENCOUNTER — Encounter: Payer: Self-pay | Admitting: Obstetrics & Gynecology

## 2016-12-15 ENCOUNTER — Ambulatory Visit (INDEPENDENT_AMBULATORY_CARE_PROVIDER_SITE_OTHER): Payer: Medicaid Other | Admitting: Obstetrics & Gynecology

## 2016-12-15 VITALS — BP 136/77 | HR 77 | Wt 151.3 lb

## 2016-12-15 DIAGNOSIS — N924 Excessive bleeding in the premenopausal period: Secondary | ICD-10-CM | POA: Diagnosis not present

## 2016-12-15 DIAGNOSIS — F419 Anxiety disorder, unspecified: Secondary | ICD-10-CM | POA: Diagnosis not present

## 2016-12-15 NOTE — Progress Notes (Signed)
Language Resources Bishnu Paudel

## 2016-12-15 NOTE — Progress Notes (Signed)
   Subjective:    Patient ID: April Mcmahon, female    DOB: 12-09-1960, 56 y.o.   MRN: [id number]  HPI 56 yo M Bhutanese P6 here for results of her EMBX. She came in with DUB, sounded like perimenopausal bleeding. She has never missed more than 2 months. Her u/s was normal.   Review of Systems Pap was normal 2018   mammogram normal 2018 She has had a BTL.  Objective:   Physical Exam Interpretor present for exam Breathing, conversing, and ambulating normally     Assessment & Plan:  Perimenopausal bleeding - reassurance given Anxiety- rec that she see Roselyn Reef

## 2016-12-20 ENCOUNTER — Ambulatory Visit (INDEPENDENT_AMBULATORY_CARE_PROVIDER_SITE_OTHER): Payer: Self-pay | Admitting: Clinical

## 2016-12-20 DIAGNOSIS — F4322 Adjustment disorder with anxiety: Secondary | ICD-10-CM

## 2016-12-20 NOTE — BH Specialist Note (Signed)
Integrated Behavioral Health Initial Visit  MRN: [id number] Name: April Mcmahon  Number of [place] Clinician visits:: 1/6 Session Start time: 2:30  Session End time: 3:14 Total time: 45 minutes  Type of Service: [place] Interpretor:Yes.   Interpretor Name and Language: Nepalese   Warm Hand Off Completed.       SUBJECTIVE: April Mcmahon is a 56 y.o. female accompanied by Interpreter Patient was referred by Dr Hulan Fray for anxiety. Patient reports the following symptoms/concerns: Pt states her primary concern today is feeling homesick for her homeland and parents, reminiscing over her former life, worry causing difficulty falling asleep, difficulty concentrating, poor memory, wanting to isolate herself from others, feeling aches and pains in her body, feeling as if her chest is squeezed and heart is beating fast when she worries, feeling fear. Plantar fascitis has returned.  Duration of problem: Increasing over the past several months; Severity of problem: moderate  OBJECTIVE: Mood: Anxious and Affect: Appropriate Risk of harm to self or others: No plan to harm self or others  LIFE CONTEXT: Family and Social: Parents deceased; some family still in [place]. Local family supportive, one grandchild. School/Work: - Self-Care: Walk away from everyone when feeling the most stressed Life Changes: Time passing, missing family; changing health condition  GOALS ADDRESSED: Patient will: 1. Reduce symptoms of: anxiety 2. Increase knowledge and/or ability of: self-management skills  3. Demonstrate ability to: Increase healthy adjustment to current life circumstances  INTERVENTIONS: Interventions utilized: Mindfulness or Psychologist, educational and Psychoeducation and/or Health Education  Standardized Assessments completed: Not Needed  ASSESSMENT: Patient currently experiencing Adjustment disorder with anxious mood.   Patient may benefit  from psychoeducation and brief therapeutic intervention regarding coping with symptoms of anxiety.  PLAN: 1. Follow up with behavioral health clinician on : Next medical appointment at [facility] 2. Behavioral recommendations:  -Practice relaxation breathing exercise every morning; as needed throughout the day -Sleep sound app on phone; play at bedtime -Attend neurology appointment on 12/27/16 (memory issues)  3. Referral(s): [place] (In Clinic) 4. "From scale of 1-10, how likely are you to follow plan?": 8  Garlan Fair, LCSWA  Depression screen [facility] 2/9 12/15/2016 11/29/2016 10/13/2016 08/30/2016 06/21/2016  Decreased Interest 0 0 1 1 1   Down, Depressed, Hopeless 1 0 3 1 2   PHQ - 2 Score 1 0 4 2 3   Altered sleeping 3 0 2 2 3   Tired, decreased energy 1 3 3 3 1   Change in appetite 0 0 - 1 2  Feeling bad or failure about yourself  0 1 1 2  0  Trouble concentrating 1 0 0 0 2  Moving slowly or fidgety/restless 0 0 2 (No Data) 1  Suicidal thoughts 0 0 3 1 0  PHQ-9 Score 6 4 15 11 12    GAD 7 : Generalized Anxiety Score 12/15/2016 11/29/2016 10/13/2016 08/30/2016  Nervous, Anxious, on Edge 2 0 2 0  Control/stop worrying - 0 1 1  Worry too much - different things 2 0 1 3  Trouble relaxing 2 3 0 1  Restless 1 0 2 3  Easily annoyed or irritable 2 1 2 1   Afraid - awful might happen 1 1 0 3  Total GAD 7 Score - 5 8 12

## 2016-12-27 ENCOUNTER — Ambulatory Visit: Payer: Medicaid Other | Admitting: Neurology

## 2016-12-27 VITALS — BP 157/89 | HR 93 | Ht 60.0 in | Wt 154.0 lb

## 2016-12-27 DIAGNOSIS — R413 Other amnesia: Secondary | ICD-10-CM

## 2016-12-27 NOTE — Progress Notes (Signed)
PATIENT: April Mcmahon DOB: [birth year]  Chief Complaint  Patient presents with  . Abnormal MMSE    Patient is here with an interpreter from [facility].  She was referred for an abnormal MMSE. She has a language barrier and is also illiterate.  Per patient, she has never attended school.  April Mcmahon, April Cash, MD - referring MD    HISTORICAL  April Mcmahon is a 56 year old female, seen in refer by primary care doctor Hulan Fray, [place] for evaluation of by her daughter-in-law, she is native of [place], complete by interpreter at today's clinical visit  She moved to [place] in 2011, she was illiterate, has 6 children, lives with her son and his family, she was noted to have gradual onset memory loss since 2016, gradually getting worse, but when she cooks, she forgot to turn off the stove leading to fire alarm 3 times over the past 3 years, also missed taking her medications, but tends to repeat herself, get frustrated agitated easily,  She is still independent in her daily activity otherwise, denied family history of  Laboratory evaluation 2018 A1c 6.3, normal CBC, TSH,  REVIEW OF SYSTEMS: Full 14 system review of systems performed and notable only for memory loss, shift work, feeling cold, hot, joint pain, joint swelling, too much sleep,  ALLERGIES: No Known Allergies  HOME MEDICATIONS: Current Outpatient Medications  Medication Sig Dispense Refill  . amitriptyline (ELAVIL) 100 MG tablet Take 1 tablet (100 mg total) by mouth at bedtime. 30 tablet 5  . aspirin 325 MG tablet Take 650 mg by mouth every 6 (six) hours as needed. For headache    . divalproex (DEPAKOTE ER) 250 MG 24 hr tablet Take 1 tablet (250 mg total) by mouth at bedtime. 30 tablet 11  . fluticasone (FLONASE) 50 MCG/ACT nasal spray Place 1 spray into both nostrils daily. 16 g 2  . gabapentin (NEURONTIN) 300 MG capsule Take 1 capsule (300 mg total) by mouth 2 (two) times daily. 60 capsule 2  . lisinopril (PRINIVIL,ZESTRIL) 40 MG  tablet Take 1 tablet (40 mg total) by mouth daily. 30 tablet 11  . metFORMIN (GLUCOPHAGE) 1000 MG tablet Take 1 tablet (1,000 mg total) by mouth 2 (two) times daily with a meal. 60 tablet 11  . naproxen (NAPROSYN) 500 MG tablet Take 1 tablet (500 mg total) by mouth 2 (two) times daily with a meal. X 10 days then prn pain or headache 60 tablet 1  . SUMAtriptan (IMITREX) 100 MG tablet TAKE 1 TABLET EVERY 2 HOURS AS NEEDED FOR MIGRAINE, REPEAT IN 2 HRS IF PERSISTS OR RECURS  4  . VOLTAREN 1 % GEL Apply 2 g topically 4 (four) times daily. 100 g 0   No current facility-administered medications for this visit.     PAST MEDICAL HISTORY: Past Medical History:  Diagnosis Date  . Bilateral chronic knee pain 06/2014  . Chronic low back pain 06/2014  . Chronic migraine 02/1985  . Diabetes mellitus without complication ([place]) 18/2993  . Hypertension 09/2013    PAST SURGICAL HISTORY: Past Surgical History:  Procedure Laterality Date  . no previous surgeries      FAMILY HISTORY: Family History  Problem Relation Age of Onset  . Migraines Mother   . Breast cancer Neg Hx     SOCIAL HISTORY:  Social History   Socioeconomic History  . Marital status: Married    Spouse name: Not on file  . Number of children: Not on  file  . Years of education: Not on file  . Highest education level: Not on file  Social Needs  . Financial resource strain: Not on file  . Food insecurity - worry: Not on file  . Food insecurity - inability: Not on file  . Transportation needs - medical: Not on file  . Transportation needs - non-medical: Not on file  Occupational History  . Not on file  Tobacco Use  . Smoking status: Current Some Day Smoker  . Smokeless tobacco: Never Used  Substance and Sexual Activity  . Alcohol use: No  . Drug use: No  . Sexual activity: Not on file  Other Topics Concern  . Not on file  Social History Narrative  . Not on file     PHYSICAL EXAM   Vitals:   12/27/16 1120    BP: (!) 157/89  Pulse: 93  Weight: 154 lb (69.9 kg)  Height: 5' (1.524 m)    Not recorded      Body mass index is 30.08 kg/m.  PHYSICAL EXAMNIATION:  Gen: NAD, conversant, well nourised, obese, well groomed                     Cardiovascular: Regular rate rhythm, no peripheral edema, warm, nontender. Eyes: Conjunctivae clear without exudates or hemorrhage Neck: Supple, no carotid bruits. Pulmonary: Clear to auscultation bilaterally   NEUROLOGICAL EXAM:  MMSE - Mini Mental State Exam 10/13/2016  Orientation to time 0  Orientation to Place 0  Registration 2  Attention/ Calculation 0  Recall 2  Language- name 2 objects 2  Language- repeat 0  Language- repeat-comments language barrier  Language- follow 3 step command 1  Language- follow 3 step command-comments language barrier  Language- read & follow direction 0  Write a sentence 0  Write a sentence-comments patient is iliterate  Copy design 0  Total score 7    CRANIAL NERVES: CN II: Visual fields are full to confrontation. Fundoscopic exam is normal with sharp discs and no vascular changes. Pupils are round equal and briskly reactive to light. CN III, IV, VI: extraocular movement are normal. No ptosis. CN V: Facial sensation is intact to pinprick in all 3 divisions bilaterally. Corneal responses are intact.  CN VII: Face is symmetric with normal eye closure and smile. CN VIII: Hearing is normal to rubbing fingers CN IX, X: Palate elevates symmetrically. Phonation is normal. CN XI: Head turning and shoulder shrug are intact CN XII: Tongue is midline with normal movements and no atrophy.  MOTOR: There is no pronator drift of out-stretched arms. Muscle bulk and tone are normal. Muscle strength is normal.  REFLEXES: Reflexes are 2+ and symmetric at the biceps, triceps, knees, and ankles. Plantar responses are flexor.  SENSORY: Intact to light touch, pinprick, positional sensation and vibratory sensation are intact  in fingers and toes.  COORDINATION: Rapid alternating movements and fine finger movements are intact. There is no dysmetria on finger-to-nose and heel-knee-shin.    GAIT/STANCE: Posture is normal. Gait is steady with normal steps, base, arm swing, and turning. Heel and toe walking are normal. Tandem gait is normal.  Romberg is absent.   DIAGNOSTIC DATA (LABS, IMAGING, TESTING) - I reviewed patient records, labs, notes, testing and imaging myself where available.   ASSESSMENT AND PLAN  Araiyah Cumpton is a 56 y.o. female   Dementia  Complete evaluation with MRI of the brain  Laboratory evaluations  Return to clinic in 2-3 months   Marcial Pacas,  M.D. Ph.D.  [facility] [address] [place], [place] [address] Ph: [phone] Fax: [phone]  CC: Emily Filbert, MD

## 2016-12-28 ENCOUNTER — Telehealth: Payer: Self-pay | Admitting: *Deleted

## 2016-12-28 LAB — COMPREHENSIVE METABOLIC PANEL
A/G RATIO: 1.7 (ref 1.2–2.2)
ALBUMIN: 4.7 g/dL (ref 3.5–5.5)
ALT: 30 IU/L (ref 0–32)
AST: 23 IU/L (ref 0–40)
Alkaline Phosphatase: 100 IU/L (ref 39–117)
BUN / CREAT RATIO: 13 (ref 9–23)
BUN: 8 mg/dL (ref 6–24)
Bilirubin Total: 0.3 mg/dL (ref 0.0–1.2)
CALCIUM: 9.3 mg/dL (ref 8.7–10.2)
CO2: 21 mmol/L (ref 20–29)
Chloride: 107 mmol/L — ABNORMAL HIGH (ref 96–106)
Creatinine, Ser: 0.62 mg/dL (ref 0.57–1.00)
GFR, EST AFRICAN AMERICAN: 118 mL/min/{1.73_m2} (ref >59)
GFR, EST NON AFRICAN AMERICAN: 103 mL/min/{1.73_m2} (ref >59)
GLOBULIN, TOTAL: 2.8 g/dL (ref 1.5–4.5)
Glucose: 134 mg/dL — ABNORMAL HIGH (ref 65–99)
POTASSIUM: 4 mmol/L (ref 3.5–5.2)
Sodium: 143 mmol/L (ref 134–144)
TOTAL PROTEIN: 7.5 g/dL (ref 6.0–8.5)

## 2016-12-28 LAB — VITAMIN B12: Vitamin B-12: 442 pg/mL (ref 232–1245)

## 2016-12-28 LAB — HIV ANTIBODY (ROUTINE TESTING W REFLEX): HIV Screen 4th Generation wRfx: NONREACTIVE

## 2016-12-28 LAB — RPR: RPR Ser Ql: NONREACTIVE

## 2016-12-28 LAB — C-REACTIVE PROTEIN: CRP: 3 mg/L (ref 0.0–4.9)

## 2016-12-28 LAB — SEDIMENTATION RATE: Sed Rate: 20 mm/hr (ref 0–40)

## 2016-12-28 NOTE — Telephone Encounter (Signed)
Called patient with the results using the language line.  Requested copy of results being mailed to her home address today.

## 2016-12-28 NOTE — Telephone Encounter (Signed)
-----   Message from Marcial Pacas, MD sent at 12/28/2016  8:11 AM EST ----- Please call patient: No significant abnormality on the laboratory evaluation, the mildly elevated glucose due to timing of the blood sample and the last meal

## 2016-12-29 ENCOUNTER — Telehealth: Payer: Self-pay | Admitting: Neurology

## 2016-12-29 NOTE — Telephone Encounter (Signed)
Anderson Malta with [facility] informed me that medicaid did not approve the MRI Brain. They faxed clinical notes on 12/28/16. The phone number for the peer to peer is [phone] and the case number is [id number]. She is schedule to have the exam done on Saturday 01/01/17 at [place].

## 2016-12-30 NOTE — Telephone Encounter (Signed)
Dr. Glade Nurse will call today at 4:30, on Dr. Rhea Belton mobile number, to complete the peer-to-peer.

## 2016-12-30 NOTE — Telephone Encounter (Signed)
Noted, thank you

## 2016-12-30 NOTE — Telephone Encounter (Signed)
[id number] MRI brain

## 2017-01-01 ENCOUNTER — Ambulatory Visit
Admission: RE | Admit: 2017-01-01 | Discharge: 2017-01-01 | Disposition: A | Discharge status: Home / Self Care | Payer: Medicaid Other | Source: Ambulatory Visit | Attending: Neurology | Admitting: Neurology

## 2017-01-01 DIAGNOSIS — R413 Other amnesia: Secondary | ICD-10-CM

## 2017-01-03 ENCOUNTER — Telehealth: Payer: Self-pay | Admitting: *Deleted

## 2017-01-03 NOTE — Telephone Encounter (Signed)
-----   Message from Marcial Pacas, MD sent at 01/03/2017  1:28 PM EST ----- Please call pt: No significant abnormality on MRI of brain

## 2017-01-03 NOTE — Telephone Encounter (Signed)
Called patient with the results using the language line.  She will keep her follow up for further discussion.

## 2017-01-28 ENCOUNTER — Telehealth: Payer: Self-pay | Admitting: *Deleted

## 2017-01-28 MED ORDER — GABAPENTIN 100 MG PO CAPS: 100.0000 mg | ORAL_CAPSULE | Freq: Three times a day (TID) | ORAL | 11 refills | Status: DC

## 2017-01-28 NOTE — Telephone Encounter (Signed)
Refill request for Gabapentin 100mg . Dr. Milinda Pointer ordered refill rx of 06/2016 for 1 year.

## 2017-02-04 ENCOUNTER — Telehealth: Payer: Self-pay | Admitting: Internal Medicine

## 2017-02-04 DIAGNOSIS — E1121 Type 2 diabetes mellitus with diabetic nephropathy: Secondary | ICD-10-CM

## 2017-02-04 MED ORDER — LISINOPRIL 40 MG PO TABS: 40.0000 mg | ORAL_TABLET | Freq: Every day | ORAL | 4 refills | Status: DC

## 2017-02-04 NOTE — Telephone Encounter (Signed)
Pt came to the office to request a refill for gabapentin (NEURONTIN) 300 MG capsule  lisinopril (PRINIVIL,ZESTRIL) 40 MG tablet [ Please sent it to [facility] on [address] Please follow up

## 2017-02-04 NOTE — Telephone Encounter (Signed)
Will forward to Dr. Wynetta Emery who patient was established with.

## 2017-02-09 ENCOUNTER — Encounter: Payer: Self-pay | Admitting: Neurology

## 2017-02-09 ENCOUNTER — Ambulatory Visit: Payer: Medicaid Other | Admitting: Neurology

## 2017-02-09 ENCOUNTER — Encounter (INDEPENDENT_AMBULATORY_CARE_PROVIDER_SITE_OTHER): Payer: Self-pay

## 2017-02-09 VITALS — BP 160/79 | HR 75 | Ht 60.0 in | Wt 152.0 lb

## 2017-02-09 DIAGNOSIS — G43009 Migraine without aura, not intractable, without status migrainosus: Secondary | ICD-10-CM | POA: Diagnosis not present

## 2017-02-09 DIAGNOSIS — R413 Other amnesia: Secondary | ICD-10-CM

## 2017-02-09 MED ORDER — SUMATRIPTAN SUCCINATE 100 MG PO TABS: 100.0000 mg | ORAL_TABLET | ORAL | 6 refills | Status: DC | PRN

## 2017-02-09 MED ORDER — VENLAFAXINE HCL 37.5 MG PO TABS: 37.5000 mg | ORAL_TABLET | Freq: Two times a day (BID) | ORAL | 11 refills | Status: DC

## 2017-02-09 NOTE — Progress Notes (Signed)
PATIENT: April Mcmahon DOB: [birth year]  Chief Complaint  Patient presents with  . Memory Loss    She is here with an interpreter from [facility] and her daughter-in-law.  They would like to review her MRI and lab results.    HISTORICAL  April Mcmahon is a 57 year old female, seen in refer by primary care doctor Hulan Fray, [place] for evaluation of by her daughter-in-law, she is native of [place], complete by interpreter at today's clinical visit  She moved to [place] in 2011, she was illiterate, has 6 children, lives with her son and his family, she was noted to have gradual onset memory loss since 2016, gradually getting worse, but when she cooks, she forgot to turn off the stove leading to fire alarm 3 times over the past 3 years, also missed taking her medications, but tends to repeat herself, get frustrated agitated easily,  She is still independent in her daily activity otherwise, denied family history of  Laboratory evaluation 2018 A1c 6.3, normal CBC, TSH,  UPDATE Feb 09 2017: I have personally reviewed MRI of the brain in December 2018 which showed mild generalized atrophy  Laboratory evaluation showed normal negative B12, C-reactive protein, ESR, RPR, HIV, CMP showed mild elevated glucose.  She has anxiety, depression, she also complains of right side headache.  She could not tolerate amitriptyline due to GI side effect, also on polypharmacy this including gabapentin, Depakote ER  REVIEW OF SYSTEMS: Full 14 system review of systems performed and notable only for memory loss, joint pain, swelling, low back pain  ALLERGIES: No Known Allergies  HOME MEDICATIONS: Current Outpatient Medications  Medication Sig Dispense Refill  . amitriptyline (ELAVIL) 100 MG tablet Take 1 tablet (100 mg total) by mouth at bedtime. 30 tablet 5  . aspirin 325 MG tablet Take 650 mg by mouth every 6 (six) hours as needed. For headache    . divalproex (DEPAKOTE ER) 250 MG 24 hr tablet Take 1 tablet (250 mg  total) by mouth at bedtime. 30 tablet 11  . fluticasone (FLONASE) 50 MCG/ACT nasal spray Place 1 spray into both nostrils daily. 16 g 2  . gabapentin (NEURONTIN) 100 MG capsule Take 1 capsule (100 mg total) by mouth 3 (three) times daily. 90 capsule 11  . gabapentin (NEURONTIN) 300 MG capsule Take 1 capsule (300 mg total) by mouth 2 (two) times daily. 60 capsule 2  . lisinopril (PRINIVIL,ZESTRIL) 40 MG tablet Take 1 tablet (40 mg total) by mouth daily. 30 tablet 4  . metFORMIN (GLUCOPHAGE) 1000 MG tablet Take 1 tablet (1,000 mg total) by mouth 2 (two) times daily with a meal. 60 tablet 11  . naproxen (NAPROSYN) 500 MG tablet Take 1 tablet (500 mg total) by mouth 2 (two) times daily with a meal. X 10 days then prn pain or headache 60 tablet 1  . SUMAtriptan (IMITREX) 100 MG tablet TAKE 1 TABLET EVERY 2 HOURS AS NEEDED FOR MIGRAINE, REPEAT IN 2 HRS IF PERSISTS OR RECURS  4  . VOLTAREN 1 % GEL Apply 2 g topically 4 (four) times daily. 100 g 0   No current facility-administered medications for this visit.     PAST MEDICAL HISTORY: Past Medical History:  Diagnosis Date  . Bilateral chronic knee pain 06/2014  . Chronic low back pain 06/2014  . Chronic migraine 02/1985  . Diabetes mellitus without complication ([place]) 99/3716  . Hypertension 09/2013    PAST SURGICAL HISTORY: Past Surgical History:  Procedure Laterality Date  . no  previous surgeries      FAMILY HISTORY: Family History  Problem Relation Age of Onset  . Migraines Mother   . Breast cancer Neg Hx     SOCIAL HISTORY:  Social History   Socioeconomic History  . Marital status: Married    Spouse name: Not on file  . Number of children: Not on file  . Years of education: Not on file  . Highest education level: Not on file  Social Needs  . Financial resource strain: Not on file  . Food insecurity - worry: Not on file  . Food insecurity - inability: Not on file  . Transportation needs - medical: Not on file  .  Transportation needs - non-medical: Not on file  Occupational History  . Not on file  Tobacco Use  . Smoking status: Current Some Day Smoker  . Smokeless tobacco: Never Used  Substance and Sexual Activity  . Alcohol use: No  . Drug use: No  . Sexual activity: Not on file  Other Topics Concern  . Not on file  Social History Narrative  . Not on file     PHYSICAL EXAM   Vitals:   02/09/17 1058  BP: (!) 160/79  Pulse: 75  Weight: 152 lb (68.9 kg)  Height: 5' (1.524 m)    Not recorded      Body mass index is 29.69 kg/m.  PHYSICAL EXAMNIATION:  Gen: NAD, conversant, well nourised, obese, well groomed                     Cardiovascular: Regular rate rhythm, no peripheral edema, warm, nontender. Eyes: Conjunctivae clear without exudates or hemorrhage Neck: Supple, no carotid bruits. Pulmonary: Clear to auscultation bilaterally   NEUROLOGICAL EXAM: History is from interpreter,  CRANIAL NERVES: CN II: Visual fields are full to confrontation. Fundoscopic exam is normal with sharp discs and no vascular changes. Pupils are round equal and briskly reactive to light. CN III, IV, VI: extraocular movement are normal. No ptosis. CN V: Facial sensation is intact to pinprick in all 3 divisions bilaterally. Corneal responses are intact.  CN VII: Face is symmetric with normal eye closure and smile. CN VIII: Hearing is normal to rubbing fingers CN IX, X: Palate elevates symmetrically. Phonation is normal. CN XI: Head turning and shoulder shrug are intact CN XII: Tongue is midline with normal movements and no atrophy.  MOTOR: There is no pronator drift of out-stretched arms. Muscle bulk and tone are normal. Muscle strength is normal.  REFLEXES: Reflexes are 2+ and symmetric at the biceps, triceps, knees, and ankles. Plantar responses are flexor.  SENSORY: Intact to light touch, pinprick, positional sensation and vibratory sensation are intact in fingers and  toes.  COORDINATION: Rapid alternating movements and fine finger movements are intact. There is no dysmetria on finger-to-nose and heel-knee-shin.    GAIT/STANCE: Posture is normal. Gait is steady with normal steps, base, arm swing, and turning. Heel and toe walking are normal. Tandem gait is normal.  Romberg is absent.   DIAGNOSTIC DATA (LABS, IMAGING, TESTING) - I reviewed patient records, labs, notes, testing and imaging myself where available.   ASSESSMENT AND PLAN  April Mcmahon is a 57 y.o. female   Mild cognitive impairment, Depression anxiety Chronic migraine headaches  MRI of the brain showed no structural lesion, age-related changes  Laboratory evaluations showed no treatable etiology  Could not tolerate amitriptyline, due to GI side effect,  Will try Effexor 37.5 mg twice a day as  preventive medications  Imitrex 100 mg as needed  Marcial Pacas, M.D. Ph.D.  [facility] [address], [place], [place] [address] Ph: [phone] Fax: [phone]  CC: Emily Filbert, MD

## 2017-02-14 ENCOUNTER — Ambulatory Visit: Payer: Medicaid Other | Attending: Internal Medicine | Admitting: Internal Medicine

## 2017-02-14 ENCOUNTER — Encounter: Payer: Self-pay | Admitting: Internal Medicine

## 2017-02-14 VITALS — BP 156/86 | HR 73 | Temp 98.3°F | Resp 16 | Wt 152.0 lb

## 2017-02-14 DIAGNOSIS — I10 Essential (primary) hypertension: Secondary | ICD-10-CM | POA: Insufficient documentation

## 2017-02-14 DIAGNOSIS — Z7982 Long term (current) use of aspirin: Secondary | ICD-10-CM | POA: Insufficient documentation

## 2017-02-14 DIAGNOSIS — Z79899 Other long term (current) drug therapy: Secondary | ICD-10-CM | POA: Insufficient documentation

## 2017-02-14 DIAGNOSIS — A084 Viral intestinal infection, unspecified: Secondary | ICD-10-CM | POA: Diagnosis not present

## 2017-02-14 DIAGNOSIS — E785 Hyperlipidemia, unspecified: Secondary | ICD-10-CM | POA: Diagnosis not present

## 2017-02-14 DIAGNOSIS — G3184 Mild cognitive impairment, so stated: Secondary | ICD-10-CM | POA: Insufficient documentation

## 2017-02-14 DIAGNOSIS — N938 Other specified abnormal uterine and vaginal bleeding: Secondary | ICD-10-CM | POA: Diagnosis not present

## 2017-02-14 DIAGNOSIS — F329 Major depressive disorder, single episode, unspecified: Secondary | ICD-10-CM | POA: Diagnosis not present

## 2017-02-14 DIAGNOSIS — Z8669 Personal history of other diseases of the nervous system and sense organs: Secondary | ICD-10-CM

## 2017-02-14 DIAGNOSIS — M722 Plantar fascial fibromatosis: Secondary | ICD-10-CM | POA: Diagnosis not present

## 2017-02-14 DIAGNOSIS — E118 Type 2 diabetes mellitus with unspecified complications: Secondary | ICD-10-CM | POA: Diagnosis not present

## 2017-02-14 DIAGNOSIS — E1142 Type 2 diabetes mellitus with diabetic polyneuropathy: Secondary | ICD-10-CM | POA: Diagnosis not present

## 2017-02-14 DIAGNOSIS — Z7984 Long term (current) use of oral hypoglycemic drugs: Secondary | ICD-10-CM | POA: Insufficient documentation

## 2017-02-14 DIAGNOSIS — F17200 Nicotine dependence, unspecified, uncomplicated: Secondary | ICD-10-CM | POA: Insufficient documentation

## 2017-02-14 DIAGNOSIS — M170 Bilateral primary osteoarthritis of knee: Secondary | ICD-10-CM | POA: Diagnosis not present

## 2017-02-14 DIAGNOSIS — G43909 Migraine, unspecified, not intractable, without status migrainosus: Secondary | ICD-10-CM | POA: Insufficient documentation

## 2017-02-14 DIAGNOSIS — Z1211 Encounter for screening for malignant neoplasm of colon: Secondary | ICD-10-CM

## 2017-02-14 DIAGNOSIS — F4322 Adjustment disorder with anxiety: Secondary | ICD-10-CM | POA: Diagnosis not present

## 2017-02-14 DIAGNOSIS — E1140 Type 2 diabetes mellitus with diabetic neuropathy, unspecified: Secondary | ICD-10-CM | POA: Diagnosis not present

## 2017-02-14 DIAGNOSIS — E559 Vitamin D deficiency, unspecified: Secondary | ICD-10-CM | POA: Insufficient documentation

## 2017-02-14 DIAGNOSIS — F32A Depression, unspecified: Secondary | ICD-10-CM

## 2017-02-14 DIAGNOSIS — E119 Type 2 diabetes mellitus without complications: Secondary | ICD-10-CM | POA: Diagnosis present

## 2017-02-14 LAB — GLUCOSE, POCT (MANUAL RESULT ENTRY): POC Glucose: 207 mg/dl — AB (ref 70–99)

## 2017-02-14 LAB — POCT GLYCOSYLATED HEMOGLOBIN (HGB A1C): Hemoglobin A1C: 6.7

## 2017-02-14 MED ORDER — GLUCOSE BLOOD VI STRP: ORAL_STRIP | 12 refills | Status: DC

## 2017-02-14 MED ORDER — AMLODIPINE BESYLATE 5 MG PO TABS: 5.0000 mg | ORAL_TABLET | Freq: Every day | ORAL | 3 refills | Status: DC

## 2017-02-14 MED ORDER — GABAPENTIN 300 MG PO CAPS: 300.0000 mg | ORAL_CAPSULE | Freq: Two times a day (BID) | ORAL | 5 refills | Status: DC

## 2017-02-14 MED ORDER — TRUEPLUS LANCETS 28G MISC: 1 refills | Status: DC

## 2017-02-14 MED ORDER — TRUE METRIX METER W/DEVICE KIT: PACK | 0 refills | Status: DC

## 2017-02-14 NOTE — Patient Instructions (Signed)
Purchased and use some Pepto Bismol over the counter for the diarrhea, abdominal cramps and vomiting.  Drink fluids to keep hydrated.    Your blood pressure is not well controlled.  Add Amlodipine.   I have refilled the Gabapentin for the peripheral neuropathy symptoms.    I have sent a prescription to your pharmacy for a glucometer and stripes to check your blood sugar.

## 2017-02-14 NOTE — Progress Notes (Signed)
Patient ID: April Mcmahon, female    DOB: 01/05/1961  MRN: [id number]  CC: Diabetes   Subjective: April Mcmahon is a 57 y.o. female who presents for chronic ds management.  Daughter-in-law, Enis Gash, is with her and interprets  Her concerns today include:  Pt with hx of DM, HTN, HL, tob dep, migraines, Vit D def, DJD knees and plantar fasciitis.  1.  DUB: saw GYN and had negative EMB.  Thought to be perimenopausal.  Last menses was 3 wks ago and lasted 10 days but was light. Menses occur Q 2-3 mths  2.  Saw neurologist, Dr Krista Blue, on 02/09/2017 for f/u migraine, MCI and anx/dep. -Elavil d/c due to GI upset.  Placed on Effexor instead she is tolerating.  3. DM: no device to check BS. Would like to get glucometer and supplies -compliant with Metformin -c/o pain and numbness on soles of both feet and pain in knees.  Hx of DM neuropathy and plantar fasciitis for which she saw podiatry about 7 mths ago.  Was on Gabapentin 300 mg BID.  Out x 1 wk.  Gabapentin is helpful in controlling pain  4.  C/o vomiting and diarrhea x 2 days. No sick contacts -had 5 loose stools this a.m. No blood in stools.  No fever.  +abdominal cramps.  Taking an OTC med for the diarrhea -no recent abx.   5.  HTN:  No device to check BP at home -limits salt in foods No CP/SOB/LE edema  6. Dep/Anxiety: saw LCSW on 12/20/2016 for adjustment disorder.  [place]  Recently placed on Effexor by neurology   Patient Active Problem List   Diagnosis Date Noted  . Memory loss 12/27/2016  . Dysfunctional uterine bleeding 08/30/2016  . Analgesic rebound headache 06/24/2016  . Esophageal dysphagia 06/21/2016  . Tobacco dependence 01/20/2016  . Plantar fasciitis, bilateral 12/24/2015  . 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 Diabetes type 2, controlled ([place]) 06/19/2015  . Essential hypertension 06/19/2015  . Vitamin D deficiency 09/24/2013  . Hyperlipidemia 09/24/2013  . Migraine 05/15/2013    . Low back pain 08/18/2012     Current Outpatient Medications on File Prior to Visit  Medication Sig Dispense Refill  . aspirin 325 MG tablet Take 650 mg by mouth every 6 (six) hours as needed. For headache    . divalproex (DEPAKOTE ER) 250 MG 24 hr tablet Take 1 tablet (250 mg total) by mouth at bedtime. 30 tablet 11  . fluticasone (FLONASE) 50 MCG/ACT nasal spray Place 1 spray into both nostrils daily. 16 g 2  . lisinopril (PRINIVIL,ZESTRIL) 40 MG tablet Take 1 tablet (40 mg total) by mouth daily. 30 tablet 4  . metFORMIN (GLUCOPHAGE) 1000 MG tablet Take 1 tablet (1,000 mg total) by mouth 2 (two) times daily with a meal. 60 tablet 11  . naproxen (NAPROSYN) 500 MG tablet Take 1 tablet (500 mg total) by mouth 2 (two) times daily with a meal. X 10 days then prn pain or headache 60 tablet 1  . SUMAtriptan (IMITREX) 100 MG tablet Take 1 tablet (100 mg total) by mouth as needed for migraine. May repeat in 2 hours if headache persists or recurs.  Do not refill in less than 30 days 10 tablet 6  . venlafaxine (EFFEXOR) 37.5 MG tablet Take 1 tablet (37.5 mg total) by mouth 2 (two) times daily with a meal. 60 tablet 11  . VOLTAREN 1 % GEL Apply 2 g topically 4 (four)  times daily. 100 g 0   No current facility-administered medications on file prior to visit.     Allergies  Allergen Reactions  . Elavil [Amitriptyline Hcl]     GI upset    Social History   Socioeconomic History  . Marital status: Married    Spouse name: Not on file  . Number of children: Not on file  . Years of education: Not on file  . Highest education level: Not on file  Social Needs  . Financial resource strain: Not on file  . Food insecurity - worry: Not on file  . Food insecurity - inability: Not on file  . Transportation needs - medical: Not on file  . Transportation needs - non-medical: Not on file  Occupational History  . Not on file  Tobacco Use  . Smoking status: Current Some Day Smoker  . Smokeless  tobacco: Never Used  Substance and Sexual Activity  . Alcohol use: No  . Drug use: No  . Sexual activity: Not on file  Other Topics Concern  . Not on file  Social History Narrative  . Not on file    Family History  Problem Relation Age of Onset  . Migraines Mother   . Breast cancer Neg Hx     Past Surgical History:  Procedure Laterality Date  . no previous surgeries      ROS: Review of Systems As above  PHYSICAL EXAM: BP (!) 156/86   Pulse 73   Temp 98.3 F (36.8 C) (Oral)   Resp 16   Wt 152 lb (68.9 kg)   SpO2 97%   BMI 29.69 kg/m   Wt Readings from Last 3 Encounters:  02/14/17 152 lb (68.9 kg)  02/09/17 152 lb (68.9 kg)  12/27/16 154 lb (69.9 kg)    Physical Exam  General appearance - alert, well appearing, and in no distress Mental status - alert, oriented to person, place, and time Mouth - mucous membranes moist, pharynx normal without lesions Neck - supple, no significant adenopathy Chest - clear to auscultation, no wheezes, rales or rhonchi, symmetric air entry Heart - normal rate, regular rhythm, normal S1, S2, no murmurs, rubs, clicks or gallops Abdomen - soft, nontender, nondistended, no masses or organomegaly Extremities - no LE edema.  Diabetic Foot Exam - Simple   Simple Foot Form Visual Inspection No deformities, no ulcerations, no other skin breakdown bilaterally:  Yes Sensation Testing See comments:  Yes Pulse Check Posterior Tibialis and Dorsalis pulse intact bilaterally:  Yes Comments LEAP abnormal BL     Depression screen [facility] [address] 02/14/2017 12/15/2016 11/29/2016  Decreased Interest 1 0 0  Down, Depressed, Hopeless 1 1 0  PHQ - 2 Score 2 1 0  Altered sleeping 3 3 0  Tired, decreased energy 1 1 3   Change in appetite 0 0 0  Feeling bad or failure about yourself  1 0 1  Trouble concentrating 0 1 0  Moving slowly or fidgety/restless 2 0 0  Suicidal thoughts 0 0 0  PHQ-9 Score 9 6 4     ASSESSMENT AND PLAN: 1. Controlled type 2  diabetes mellitus with complication, without long-term current use of insulin (HCC) Continue metformin and healthy eating habits. Encourage her to get out and walk 2-3 times a week - POCT glucose (manual entry) - POCT glycosylated hemoglobin (Hb A1C) - Ambulatory referral to Ophthalmology  2. Diabetic peripheral neuropathy (HCC) -Refill gabapentin  3. Essential hypertension Not at goal. Continue lisinopril. Add Norvasc. DASH diet  discussed and encouraged  4. Viral gastroenteritis Recommended pushing fluids. Purchase Pepto-Bismol over-the-counter and use as needed  5. Depression, unspecified depression type -Due to [place] of [place]. She is on Effexor as prophylaxis for migraines from her neurologist but this should also help with depression  6. MCI (mild cognitive impairment) 7. History of migraine Followed by neurology  8. DUB (dysfunctional uterine bleeding) Perimenopausal.  EMB negative.  9. Colon cancer screening - Ambulatory referral to Gastroenterology  Patient was given the opportunity to ask questions.  Patient verbalized understanding of the plan and was able to repeat key elements of the plan.   Orders Placed This Encounter  Procedures  . Ambulatory referral to Ophthalmology  . Ambulatory referral to Gastroenterology  . POCT glucose (manual entry)  . POCT glycosylated hemoglobin (Hb A1C)     Requested Prescriptions   Signed Prescriptions Disp Refills  . amLODipine (NORVASC) 5 MG tablet 90 tablet 3    Sig: Take 1 tablet (5 mg total) by mouth daily.  Marland Kitchen gabapentin (NEURONTIN) 300 MG capsule 60 capsule 5    Sig: Take 1 capsule (300 mg total) by mouth 2 (two) times daily.  . Blood Glucose Monitoring Suppl (TRUE METRIX METER) w/Device KIT 1 kit 0    Sig: Use as directed  . glucose blood (TRUE METRIX BLOOD GLUCOSE TEST) test strip 100 each 12    Sig: Use as instructed  . TRUEPLUS LANCETS 28G MISC 100 each 1    Sig: Use as directed     Return in about 3 months (around 05/15/2017).  Karle Plumber, MD, FACP

## 2017-03-29 ENCOUNTER — Ambulatory Visit: Payer: Medicaid Other | Admitting: Neurology

## 2017-04-07 ENCOUNTER — Ambulatory Visit: Payer: Medicaid Other | Attending: Internal Medicine | Admitting: Internal Medicine

## 2017-04-07 ENCOUNTER — Encounter: Payer: Self-pay | Admitting: Internal Medicine

## 2017-04-07 DIAGNOSIS — I10 Essential (primary) hypertension: Secondary | ICD-10-CM | POA: Insufficient documentation

## 2017-04-07 DIAGNOSIS — M722 Plantar fascial fibromatosis: Secondary | ICD-10-CM | POA: Diagnosis not present

## 2017-04-07 DIAGNOSIS — Z79899 Other long term (current) drug therapy: Secondary | ICD-10-CM | POA: Insufficient documentation

## 2017-04-07 DIAGNOSIS — G8929 Other chronic pain: Secondary | ICD-10-CM

## 2017-04-07 DIAGNOSIS — Z7984 Long term (current) use of oral hypoglycemic drugs: Secondary | ICD-10-CM | POA: Diagnosis not present

## 2017-04-07 DIAGNOSIS — F17200 Nicotine dependence, unspecified, uncomplicated: Secondary | ICD-10-CM | POA: Insufficient documentation

## 2017-04-07 DIAGNOSIS — Z7982 Long term (current) use of aspirin: Secondary | ICD-10-CM | POA: Diagnosis not present

## 2017-04-07 DIAGNOSIS — M79671 Pain in right foot: Secondary | ICD-10-CM | POA: Diagnosis not present

## 2017-04-07 DIAGNOSIS — E785 Hyperlipidemia, unspecified: Secondary | ICD-10-CM | POA: Insufficient documentation

## 2017-04-07 DIAGNOSIS — M79672 Pain in left foot: Secondary | ICD-10-CM | POA: Diagnosis not present

## 2017-04-07 DIAGNOSIS — E1142 Type 2 diabetes mellitus with diabetic polyneuropathy: Secondary | ICD-10-CM | POA: Insufficient documentation

## 2017-04-07 DIAGNOSIS — M25561 Pain in right knee: Secondary | ICD-10-CM | POA: Diagnosis not present

## 2017-04-07 MED ORDER — ACETAMINOPHEN-CODEINE 300-30 MG PO TABS: 1.0000 | ORAL_TABLET | Freq: Four times a day (QID) | ORAL | 0 refills | Status: DC | PRN

## 2017-04-07 MED ORDER — ACCU-CHEK AVIVA DEVI: 0 refills | Status: AC

## 2017-04-07 MED ORDER — GLUCOSE BLOOD VI STRP: ORAL_STRIP | 12 refills | Status: DC

## 2017-04-07 MED ORDER — MELOXICAM 15 MG PO TABS: 15.0000 mg | ORAL_TABLET | Freq: Every day | ORAL | 2 refills | Status: DC

## 2017-04-07 MED ORDER — ACCU-CHEK SOFTCLIX LANCETS MISC: 12 refills | Status: DC

## 2017-04-07 NOTE — Progress Notes (Signed)
Patient ID: April Mcmahon, female    DOB: 03-Jan-1961  MRN: [id number]  CC: Knee Pain and Foot Pain   Subjective: April Mcmahon is a 56 y.o. female who presents for UC.  Daughter-in-law, Enis Gash, is with her and interprets Her concerns today include:  Pt with hx of DM neuroathy, HTN, HL, tob dep, migraines, Vit D def, DJD knees and plantar fasciitis.  1. C/o pain both feet mainly of the heels over the past several weeks. Hx of plantar fasciitis.[facility] podiatry for same >7 mths ago and was given injections with improvement.  Pain is worse with walking.  She is not sure if the pain is worse first thing in the mornings  2. C/o pain RT knee x several mhs -+stiffness, and pain with ambulation No swelling Fell 1 wk ago.  Now it hurts when she crosses her knee [place]   Patient Active Problem List   Diagnosis Date Noted  . MCI (mild cognitive impairment) 02/14/2017  . Diabetic peripheral neuropathy ([place]) 02/14/2017  . Memory loss 12/27/2016  . Dysfunctional uterine bleeding 08/30/2016  . Analgesic rebound headache 06/24/2016  . Esophageal dysphagia 06/21/2016  . Tobacco dependence 01/20/2016  . Plantar fasciitis, bilateral 12/24/2015  . 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 Diabetes type 2, controlled ([place]) 06/19/2015  . Essential hypertension 06/19/2015  . Vitamin D deficiency 09/24/2013  . Hyperlipidemia 09/24/2013  . Migraine 05/15/2013  . Low back pain 08/18/2012     Current Outpatient Medications on File Prior to Visit  Medication Sig Dispense Refill  . amLODipine (NORVASC) 5 MG tablet Take 1 tablet (5 mg total) by mouth daily. 90 tablet 3  . aspirin 325 MG tablet Take 650 mg by mouth every 6 (six) hours as needed. For headache    . Blood Glucose Monitoring Suppl (TRUE METRIX METER) w/Device KIT Use as directed 1 kit 0  . divalproex (DEPAKOTE ER) 250 MG 24 hr tablet Take 1 tablet (250 mg total) by mouth at bedtime. 30 tablet 11  . fluticasone (FLONASE) 50  MCG/ACT nasal spray Place 1 spray into both nostrils daily. 16 g 2  . gabapentin (NEURONTIN) 300 MG capsule Take 1 capsule (300 mg total) by mouth 2 (two) times daily. 60 capsule 5  . glucose blood (TRUE METRIX BLOOD GLUCOSE TEST) test strip Use as instructed 100 each 12  . lisinopril (PRINIVIL,ZESTRIL) 40 MG tablet Take 1 tablet (40 mg total) by mouth daily. 30 tablet 4  . metFORMIN (GLUCOPHAGE) 1000 MG tablet Take 1 tablet (1,000 mg total) by mouth 2 (two) times daily with a meal. 60 tablet 11  . SUMAtriptan (IMITREX) 100 MG tablet Take 1 tablet (100 mg total) by mouth as needed for migraine. May repeat in 2 hours if headache persists or recurs.  Do not refill in less than 30 days 10 tablet 6  . TRUEPLUS LANCETS 28G MISC Use as directed 100 each 1  . venlafaxine (EFFEXOR) 37.5 MG tablet Take 1 tablet (37.5 mg total) by mouth 2 (two) times daily with a meal. 60 tablet 11  . VOLTAREN 1 % GEL Apply 2 g topically 4 (four) times daily. 100 g 0   No current facility-administered medications on file prior to visit.     Allergies  Allergen Reactions  . Elavil [Amitriptyline Hcl]     GI upset    Social History   Socioeconomic History  . Marital status: Married    Spouse name: Not on file  .  Number of children: Not on file  . Years of education: Not on file  . Highest education level: Not on file  Social Needs  . Financial resource strain: Not on file  . Food insecurity - worry: Not on file  . Food insecurity - inability: Not on file  . Transportation needs - medical: Not on file  . Transportation needs - non-medical: Not on file  Occupational History  . Not on file  Tobacco Use  . Smoking status: Current Some Day Smoker  . Smokeless tobacco: Never Used  Substance and Sexual Activity  . Alcohol use: No  . Drug use: No  . Sexual activity: Not on file  Other Topics Concern  . Not on file  Social History Narrative  . Not on file    Family History  Problem Relation Age of Onset   . Migraines Mother   . Breast cancer Neg Hx     Past Surgical History:  Procedure Laterality Date  . no previous surgeries      ROS: Review of Systems Negative except as stated above PHYSICAL EXAM: BP 131/80   Pulse 82   Temp 97.7 F (36.5 C) (Oral)   Resp 16   Wt 148 lb 6.4 oz (67.3 kg)   SpO2 96%   BMI 28.98 kg/m   Physical Exam  General appearance - alert, well appearing, and in no distress Mental status - alert, oriented to person, place, and time, normal mood, behavior, speech, dress, motor activity, and thought processes Musculoskeletal -feet: mild tenderness on palpation of both heels and instep. Right knee: No edema or erythema.  Mild to moderate tenderness on palpation of the medial joint line.  Slight discomfort with flexion and extension.  ASSESSMENT AND PLAN: 1. Chronic pain of right knee -Will image given recent fall.  Start meloxicam.  Given her short course of Tylenol 3's to use as needed - DG Knee Complete 4 Views Right; Future - meloxicam (MOBIC) 15 MG tablet; Take 1 tablet (15 mg total) by mouth daily.  Dispense: 30 tablet; Refill: 2 - Acetaminophen-Codeine (TYLENOL/CODEINE #3) 300-30 MG tablet; Take 1 tablet by mouth every 6 (six) hours as needed for pain.  Dispense: 20 tablet; Refill: 0  2. Pain in both feet Likely flare of plantar fasciitis. - Ambulatory referral to Podiatry  Patient was given the opportunity to ask questions.  Patient verbalized understanding of the plan and was able to repeat key elements of the plan.   Orders Placed This Encounter  Procedures  . DG Knee Complete 4 Views Right  . Ambulatory referral to Podiatry     Requested Prescriptions   Signed Prescriptions Disp Refills  . meloxicam (MOBIC) 15 MG tablet 30 tablet 2    Sig: Take 1 tablet (15 mg total) by mouth daily.  . Acetaminophen-Codeine (TYLENOL/CODEINE #3) 300-30 MG tablet 20 tablet 0    Sig: Take 1 tablet by mouth every 6 (six) hours as needed for pain.  Marland Kitchen  glucose blood (ACCU-CHEK AVIVA) test strip 100 each 12    Sig: Use as instructed  . ACCU-CHEK SOFTCLIX LANCETS lancets 100 each 12    Sig: Use as instructed  . Blood Glucose Monitoring Suppl (ACCU-CHEK AVIVA) device 1 each 0    Sig: Use as instructed    No Follow-up on file.  Karle Plumber, MD, FACP

## 2017-04-09 ENCOUNTER — Other Ambulatory Visit: Payer: Self-pay

## 2017-04-09 ENCOUNTER — Emergency Department (HOSPITAL_COMMUNITY)
Admission: EM | Admit: 2017-04-09 | Discharge: 2017-04-09 | Disposition: A | Discharge status: Home / Self Care | Payer: Medicaid Other | Attending: Emergency Medicine | Admitting: Emergency Medicine

## 2017-04-09 ENCOUNTER — Emergency Department (HOSPITAL_COMMUNITY): Payer: Medicaid Other

## 2017-04-09 ENCOUNTER — Encounter (HOSPITAL_COMMUNITY): Payer: Self-pay | Admitting: *Deleted

## 2017-04-09 DIAGNOSIS — M25561 Pain in right knee: Secondary | ICD-10-CM

## 2017-04-09 DIAGNOSIS — Z7984 Long term (current) use of oral hypoglycemic drugs: Secondary | ICD-10-CM | POA: Diagnosis not present

## 2017-04-09 DIAGNOSIS — I10 Essential (primary) hypertension: Secondary | ICD-10-CM | POA: Insufficient documentation

## 2017-04-09 DIAGNOSIS — F17200 Nicotine dependence, unspecified, uncomplicated: Secondary | ICD-10-CM | POA: Insufficient documentation

## 2017-04-09 DIAGNOSIS — E1140 Type 2 diabetes mellitus with diabetic neuropathy, unspecified: Secondary | ICD-10-CM | POA: Insufficient documentation

## 2017-04-09 DIAGNOSIS — Z7982 Long term (current) use of aspirin: Secondary | ICD-10-CM | POA: Diagnosis not present

## 2017-04-09 DIAGNOSIS — Z79899 Other long term (current) drug therapy: Secondary | ICD-10-CM | POA: Diagnosis not present

## 2017-04-09 MED ORDER — TRAMADOL HCL 50 MG PO TABS: 50.0000 mg | ORAL_TABLET | Freq: Four times a day (QID) | ORAL | 0 refills | Status: DC | PRN

## 2017-04-09 MED ORDER — IBUPROFEN 800 MG PO TABS: 800.0000 mg | ORAL_TABLET | Freq: Three times a day (TID) | ORAL | 0 refills | Status: DC | PRN

## 2017-04-09 NOTE — Discharge Instructions (Signed)
Return here as needed.  Follow-up with the orthopedist provided.  The x-rays did not show any abnormalities.

## 2017-04-09 NOTE — ED Triage Notes (Signed)
To ED for eval of ongoing right knee pain since slipping on bathroom floor at home and falling. No dizziness associated with fall. No obvious injury to knee. ambulatory

## 2017-04-09 NOTE — ED Provider Notes (Signed)
[facility] EMERGENCY DEPARTMENT Provider Note   CSN: [id number] Arrival date & time: 04/09/17  1327     History   Chief Complaint Chief Complaint  Patient presents with  . Knee Pain    HPI April Mcmahon is a 57 y.o. female.  HPI Patient presents to the emergency department with right knee pain that is been ongoing over the last 2 weeks the patient slipped on her bathroom floor twisting her right knee.  Patient states that movement and past patient.  Patient states that she did not take any medications prior to arrival for symptoms.  Patient denies any numbness or weakness in the leg Past Medical History:  Diagnosis Date  . Bilateral chronic knee pain 06/2014  . Chronic low back pain 06/2014  . Chronic migraine 02/1985  . Diabetes mellitus without complication ([place]) 63/7858  . Hypertension 09/2013    Patient Active Problem List   Diagnosis Date Noted  . MCI (mild cognitive impairment) 02/14/2017  . Diabetic peripheral neuropathy ([place]) 02/14/2017  . Memory loss 12/27/2016  . Dysfunctional uterine bleeding 08/30/2016  . Analgesic rebound headache 06/24/2016  . Esophageal dysphagia 06/21/2016  . Tobacco dependence 01/20/2016  . Plantar fasciitis, bilateral 12/24/2015  . 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 Diabetes type 2, controlled ([place]) 06/19/2015  . Essential hypertension 06/19/2015  . Vitamin D deficiency 09/24/2013  . Hyperlipidemia 09/24/2013  . Migraine 05/15/2013  . Low back pain 08/18/2012    Past Surgical History:  Procedure Laterality Date  . no previous surgeries      OB History    No data available       Home Medications    Prior to Admission medications   Medication Sig Start Date End Date Taking? Authorizing Provider  ACCU-CHEK SOFTCLIX LANCETS lancets Use as instructed 04/07/17   Ladell Pier, MD  Acetaminophen-Codeine (TYLENOL/CODEINE #3) 300-30 MG tablet Take 1 tablet by mouth every 6 (six) hours as  needed for pain. 04/07/17   Ladell Pier, MD  amLODipine (NORVASC) 5 MG tablet Take 1 tablet (5 mg total) by mouth daily. 02/14/17   Ladell Pier, MD  aspirin 325 MG tablet Take 650 mg by mouth every 6 (six) hours as needed. For headache    [provider]  Blood Glucose Monitoring Suppl (ACCU-CHEK AVIVA) device Use as instructed 04/07/17 04/07/18  Ladell Pier, MD  Blood Glucose Monitoring Suppl (TRUE METRIX METER) w/Device KIT Use as directed 02/14/17   Ladell Pier, MD  divalproex (DEPAKOTE ER) 250 MG 24 hr tablet Take 1 tablet (250 mg total) by mouth at bedtime. 06/24/16   Melvenia Beam, MD  fluticasone (FLONASE) 50 MCG/ACT nasal spray Place 1 spray into both nostrils daily. 10/13/16   Argentina Donovan, PA-C  gabapentin (NEURONTIN) 300 MG capsule Take 1 capsule (300 mg total) by mouth 2 (two) times daily. 02/14/17   Ladell Pier, MD  glucose blood (ACCU-CHEK AVIVA) test strip Use as instructed 04/07/17   Ladell Pier, MD  glucose blood (TRUE METRIX BLOOD GLUCOSE TEST) test strip Use as instructed 02/14/17   Ladell Pier, MD  lisinopril (PRINIVIL,ZESTRIL) 40 MG tablet Take 1 tablet (40 mg total) by mouth daily. 02/04/17   Ladell Pier, MD  meloxicam (MOBIC) 15 MG tablet Take 1 tablet (15 mg total) by mouth daily. 04/07/17   Ladell Pier, MD  metFORMIN (GLUCOPHAGE) 1000 MG tablet Take 1 tablet (1,000 mg total) by mouth  2 (two) times daily with a meal. 04/19/16   Funches, Adriana Mccallum, MD  SUMAtriptan (IMITREX) 100 MG tablet Take 1 tablet (100 mg total) by mouth as needed for migraine. May repeat in 2 hours if headache persists or recurs.  Do not refill in less than 30 days 02/09/17   Marcial Pacas, MD  TRUEPLUS LANCETS 28G MISC Use as directed 02/14/17   Ladell Pier, MD  venlafaxine [facility] 37.5 MG tablet Take 1 tablet (37.5 mg total) by mouth 2 (two) times daily with a meal. 02/09/17   Marcial Pacas, MD  VOLTAREN 1 % GEL Apply 2 g topically 4 (four) times  daily. 07/05/16   Boykin Nearing, MD    Family History Family History  Problem Relation Age of Onset  . Migraines Mother   . Breast cancer Neg Hx     Social History Social History   Tobacco Use  . Smoking status: Current Some Day Smoker  . Smokeless tobacco: Never Used  Substance Use Topics  . Alcohol use: No  . Drug use: No     Allergies   Elavil [amitriptyline hcl]   Review of Systems Review of Systems All other systems negative except as documented in the HPI. All pertinent positives and negatives as reviewed in the HPI.  Physical Exam Updated Vital Signs BP (!) 127/58 (BP Location: Right Arm)   Pulse 90   Temp 98.2 F (36.8 C) (Oral)   Resp 16   SpO2 99%   Physical Exam  Constitutional: She is oriented to person, place, and time. She appears well-developed and well-nourished. No distress.  HENT:  Head: Normocephalic and atraumatic.  Eyes: Pupils are equal, round, and reactive to light.  Pulmonary/Chest: Effort normal.  Neurological: She is alert and oriented to person, place, and time.  Skin: Skin is warm and dry.  Psychiatric: She has a normal mood and affect.  Nursing note and vitals reviewed.    ED Treatments / Results  Labs (all labs ordered are listed, but only abnormal results are displayed) Labs Reviewed - No data to display  EKG  EKG Interpretation None       Radiology Dg Knee 2 Views Right  Result Date: 04/09/2017 CLINICAL DATA:  Knee pain medially since trauma 2 weeks ago. EXAM: RIGHT KNEE - 1-2 VIEW COMPARISON:  06/06/2016 FINDINGS: No acute fracture or dislocation. No joint effusion. Joint spaces maintained. IMPRESSION: No acute osseous abnormality. Electronically Signed   By: Abigail Miyamoto M.D.   On: 04/09/2017 14:41    Procedures Procedures (including critical care time)  Medications Ordered in ED Medications - No data to display   Initial Impression / Assessment and Plan / ED Course  I have reviewed the triage vital signs  and the nursing notes.  Pertinent labs & imaging results that were available during my care of the patient were reviewed by me and considered in my medical decision making (see chart for details).     Will be referred to orthopedics.  Told return here as needed.  Ice and elevate the knee.  Final Clinical Impressions(s) / ED Diagnoses   Final diagnoses:  None    ED Discharge Orders    None       Dalia Heading, PA-C 04/09/17 1513    Blanchie Dessert, MD 04/09/17 2023

## 2017-04-09 NOTE — ED Notes (Signed)
Patient transported to X-ray 

## 2017-04-22 ENCOUNTER — Other Ambulatory Visit: Payer: Self-pay | Admitting: Pharmacist

## 2017-04-22 DIAGNOSIS — E119 Type 2 diabetes mellitus without complications: Secondary | ICD-10-CM

## 2017-04-22 MED ORDER — METFORMIN HCL 1000 MG PO TABS: 1000.0000 mg | ORAL_TABLET | Freq: Two times a day (BID) | ORAL | 2 refills | Status: DC

## 2017-04-29 ENCOUNTER — Ambulatory Visit: Payer: Medicaid Other | Admitting: Podiatry

## 2017-04-29 DIAGNOSIS — E1142 Type 2 diabetes mellitus with diabetic polyneuropathy: Secondary | ICD-10-CM

## 2017-04-29 DIAGNOSIS — M722 Plantar fascial fibromatosis: Secondary | ICD-10-CM | POA: Diagnosis not present

## 2017-04-29 NOTE — Progress Notes (Signed)
Subjective:  Patient ID: April Mcmahon, female    DOB: Jun 05, 1960,  MRN: [id number]  No chief complaint on file.  57 y.o. female presents with the above complaint. Reports of bilateral burning pain to both feet.  Present for 1-2 years. Has had a history of injections in her feet and her heels which help for 2 days but the pain returned.  See the doctor who performed unsure who she is seen.  Reports burning pain worse at night.  Endorses diabetic neuropathy for which she takes  300 mg of gabapentin twice daily.  Denies nausea vomiting fever chills  Past Medical History:  Diagnosis Date  . Bilateral chronic knee pain 06/2014  . Chronic low back pain 06/2014  . Chronic migraine 02/1985  . Diabetes mellitus without complication ([place]) 04/5595  . Hypertension 09/2013   Past Surgical History:  Procedure Laterality Date  . no previous surgeries      Current Outpatient Medications:  .  ACCU-CHEK SOFTCLIX LANCETS lancets, Use as instructed, Disp: 100 each, Rfl: 12 .  Acetaminophen-Codeine (TYLENOL/CODEINE #3) 300-30 MG tablet, Take 1 tablet by mouth every 6 (six) hours as needed for pain., Disp: 20 tablet, Rfl: 0 .  amLODipine (NORVASC) 5 MG tablet, Take 1 tablet (5 mg total) by mouth daily., Disp: 90 tablet, Rfl: 3 .  aspirin 325 MG tablet, Take 650 mg by mouth every 6 (six) hours as needed. For headache, Disp: , Rfl:  .  Blood Glucose Monitoring Suppl (ACCU-CHEK AVIVA) device, Use as instructed, Disp: 1 each, Rfl: 0 .  Blood Glucose Monitoring Suppl (TRUE METRIX METER) w/Device KIT, Use as directed, Disp: 1 kit, Rfl: 0 .  divalproex (DEPAKOTE ER) 250 MG 24 hr tablet, Take 1 tablet (250 mg total) by mouth at bedtime., Disp: 30 tablet, Rfl: 11 .  fluticasone (FLONASE) 50 MCG/ACT nasal spray, Place 1 spray into both nostrils daily., Disp: 16 g, Rfl: 2 .  gabapentin (NEURONTIN) 300 MG capsule, Take 1 capsule (300 mg total) by mouth 2 (two) times daily., Disp: 60 capsule, Rfl: 5 .  glucose blood  (ACCU-CHEK AVIVA) test strip, Use as instructed, Disp: 100 each, Rfl: 12 .  glucose blood (TRUE METRIX BLOOD GLUCOSE TEST) test strip, Use as instructed, Disp: 100 each, Rfl: 12 .  ibuprofen (ADVIL,MOTRIN) 800 MG tablet, Take 1 tablet (800 mg total) by mouth every 8 (eight) hours as needed., Disp: 21 tablet, Rfl: 0 .  lisinopril (PRINIVIL,ZESTRIL) 40 MG tablet, Take 1 tablet (40 mg total) by mouth daily., Disp: 30 tablet, Rfl: 4 .  meloxicam (MOBIC) 15 MG tablet, Take 1 tablet (15 mg total) by mouth daily., Disp: 30 tablet, Rfl: 2 .  metFORMIN (GLUCOPHAGE) 1000 MG tablet, Take 1 tablet (1,000 mg total) by mouth 2 (two) times daily with a meal., Disp: 60 tablet, Rfl: 2 .  SUMAtriptan (IMITREX) 100 MG tablet, Take 1 tablet (100 mg total) by mouth as needed for migraine. May repeat in 2 hours if headache persists or recurs.  Do not refill in less than 30 days, Disp: 10 tablet, Rfl: 6 .  traMADol (ULTRAM) 50 MG tablet, Take 1 tablet (50 mg total) by mouth every 6 (six) hours as needed for severe pain., Disp: 15 tablet, Rfl: 0 .  TRUEPLUS LANCETS 28G MISC, Use as directed, Disp: 100 each, Rfl: 1 .  venlafaxine (EFFEXOR) 37.5 MG tablet, Take 1 tablet (37.5 mg total) by mouth 2 (two) times daily with a meal., Disp: 60 tablet, Rfl: 11 .  VOLTAREN  1 % GEL, Apply 2 g topically 4 (four) times daily., Disp: 100 g, Rfl: 0  Allergies  Allergen Reactions  . Elavil [Amitriptyline Hcl]     GI upset   Review of Systems Objective:  There were no vitals filed for this visit. General AA&O x3. Normal mood and affect.  Vascular Dorsalis pedis and posterior tibial pulses  present 2+ bilaterally  Capillary refill normal to all digits. Pedal hair growth normal.  Neurologic Epicritic sensation grossly diminished.  Dermatologic No open lesions. Interspaces clear of maceration. Nails well groomed and normal in appearance.  Orthopedic: MMT 5/5 in dorsiflexion, plantarflexion, inversion, and eversion. Normal joint ROM  without pain or crepitus. Pain to palpation about the medial calcaneal tubers bilateral   Assessment & Plan:  Patient was evaluated and treated and all questions answered.  Plantar Fasciitis, bilaterally  - Injection delivered to the plantar fascia per patient request.  Procedure: Injection Tendon/Ligament Location: Bilateral plantar fascia at the glabrous junction; medial approach. Skin Prep: Alcohol. Injectate: 1 cc 0.5% marcaine plain, 1 cc dexamethasone phosphate, 0.5 cc kenalog 10. Disposition: Patient tolerated procedure well. Injection site dressed with a band-aid.  Diabetic peripheral neuropathy -Discussed talking to her primary care doctor about increasing dose of gabapentin.  She is only taking 60 mg daily which is quite a low dose.  She could benefit from taking a higher dose, recommend follow-up with PCP  Return if symptoms worsen or fail to improve.Marland Kitchen

## 2017-05-06 MED FILL — ACCU-CHEK SOFTCLIX LANCETS: 25 days supply | Qty: 100 | Fill #0

## 2017-05-06 MED FILL — ACCU-CHEK AVIVA PLUS TEST S: 25 days supply | Qty: 100 | Fill #0

## 2017-05-06 MED FILL — ACCU-CHEK AVIVA PLUS METER: W/DEVICE | 25 days supply | Qty: 1 | Fill #0

## 2017-05-16 ENCOUNTER — Ambulatory Visit: Payer: Self-pay | Admitting: Internal Medicine

## 2017-06-21 ENCOUNTER — Ambulatory Visit: Payer: Medicaid Other | Attending: Internal Medicine | Admitting: Internal Medicine

## 2017-06-21 ENCOUNTER — Encounter: Payer: Self-pay | Admitting: Internal Medicine

## 2017-06-21 VITALS — BP 119/75 | HR 83 | Temp 98.1°F | Resp 16 | Wt 151.0 lb

## 2017-06-21 DIAGNOSIS — E1165 Type 2 diabetes mellitus with hyperglycemia: Secondary | ICD-10-CM | POA: Diagnosis not present

## 2017-06-21 DIAGNOSIS — H8149 Vertigo of central origin, unspecified ear: Secondary | ICD-10-CM | POA: Diagnosis not present

## 2017-06-21 DIAGNOSIS — Z791 Long term (current) use of non-steroidal anti-inflammatories (NSAID): Secondary | ICD-10-CM | POA: Diagnosis not present

## 2017-06-21 DIAGNOSIS — Z820 Family history of epilepsy and other diseases of the nervous system: Secondary | ICD-10-CM | POA: Diagnosis not present

## 2017-06-21 DIAGNOSIS — E1142 Type 2 diabetes mellitus with diabetic polyneuropathy: Secondary | ICD-10-CM | POA: Insufficient documentation

## 2017-06-21 DIAGNOSIS — Z7982 Long term (current) use of aspirin: Secondary | ICD-10-CM | POA: Insufficient documentation

## 2017-06-21 DIAGNOSIS — F17200 Nicotine dependence, unspecified, uncomplicated: Secondary | ICD-10-CM | POA: Insufficient documentation

## 2017-06-21 DIAGNOSIS — R42 Dizziness and giddiness: Secondary | ICD-10-CM | POA: Diagnosis not present

## 2017-06-21 DIAGNOSIS — E785 Hyperlipidemia, unspecified: Secondary | ICD-10-CM | POA: Insufficient documentation

## 2017-06-21 DIAGNOSIS — M545 Low back pain: Secondary | ICD-10-CM | POA: Diagnosis not present

## 2017-06-21 DIAGNOSIS — I10 Essential (primary) hypertension: Secondary | ICD-10-CM

## 2017-06-21 DIAGNOSIS — Z888 Allergy status to other drugs, medicaments and biological substances status: Secondary | ICD-10-CM | POA: Diagnosis not present

## 2017-06-21 DIAGNOSIS — M722 Plantar fascial fibromatosis: Secondary | ICD-10-CM | POA: Diagnosis not present

## 2017-06-21 DIAGNOSIS — IMO0002 Reserved for concepts with insufficient information to code with codable children: Secondary | ICD-10-CM

## 2017-06-21 DIAGNOSIS — Z803 Family history of malignant neoplasm of breast: Secondary | ICD-10-CM | POA: Insufficient documentation

## 2017-06-21 DIAGNOSIS — E559 Vitamin D deficiency, unspecified: Secondary | ICD-10-CM | POA: Diagnosis not present

## 2017-06-21 DIAGNOSIS — G3184 Mild cognitive impairment, so stated: Secondary | ICD-10-CM | POA: Insufficient documentation

## 2017-06-21 DIAGNOSIS — G43909 Migraine, unspecified, not intractable, without status migrainosus: Secondary | ICD-10-CM | POA: Diagnosis not present

## 2017-06-21 DIAGNOSIS — E118 Type 2 diabetes mellitus with unspecified complications: Secondary | ICD-10-CM

## 2017-06-21 DIAGNOSIS — Z79899 Other long term (current) drug therapy: Secondary | ICD-10-CM | POA: Insufficient documentation

## 2017-06-21 DIAGNOSIS — E119 Type 2 diabetes mellitus without complications: Secondary | ICD-10-CM

## 2017-06-21 DIAGNOSIS — R748 Abnormal levels of other serum enzymes: Secondary | ICD-10-CM

## 2017-06-21 DIAGNOSIS — R269 Unspecified abnormalities of gait and mobility: Secondary | ICD-10-CM | POA: Diagnosis not present

## 2017-06-21 DIAGNOSIS — Z7984 Long term (current) use of oral hypoglycemic drugs: Secondary | ICD-10-CM | POA: Diagnosis not present

## 2017-06-21 DIAGNOSIS — H814 Vertigo of central origin: Secondary | ICD-10-CM

## 2017-06-21 DIAGNOSIS — N938 Other specified abnormal uterine and vaginal bleeding: Secondary | ICD-10-CM | POA: Diagnosis not present

## 2017-06-21 LAB — POCT GLYCOSYLATED HEMOGLOBIN (HGB A1C): HbA1c, POC (controlled diabetic range): 7.5 % — AB (ref 0.0–7.0)

## 2017-06-21 LAB — GLUCOSE, POCT (MANUAL RESULT ENTRY): POC Glucose: 270 mg/dl — AB (ref 70–99)

## 2017-06-21 MED ORDER — GLIMEPIRIDE 1 MG PO TABS: 1.0000 mg | ORAL_TABLET | Freq: Every day | ORAL | 6 refills | Status: DC

## 2017-06-21 NOTE — Progress Notes (Signed)
Patient ID: April Mcmahon, female    DOB: [birth year]  MRN: [id number]  CC: Diabetes and Hypertension   Subjective: April Mcmahon is a 57 y.o. female who presents for chronic ds management Her concerns today include:  Daughter-in-law, Enis Gash, is with her and interprets  Pt with hx of DM neuroathy, HTN, HL, tob dep, migraines, Vit D def, DJD knees and plantar fasciitis.  C/o intermittent  dizziness x 3 days.  Due to language barrier, obtaining a good hx is some what challenging.  She reports having to hold on to walls at times because balance feels off.   -episodes last a few minutes.  No relation to head movement or position changes -no assoc dec hearing or tinnitis, N/V, headache or numbness.  No recent upper resp symptoms, Eating regularly. -no new medications Reports compliance with BP meds  C/o some pain at back of neck x 2 wks -no radiation.  No initiating factors.  Taking Mobic  DM: compliant with Metformin.  Checks BS BID.  Range 150-160.   No low BS.    Patient Active Problem List   Diagnosis Date Noted  . MCI (mild cognitive impairment) 02/14/2017  . Diabetic peripheral neuropathy ([place]) 02/14/2017  . Memory loss 12/27/2016  . Dysfunctional uterine bleeding 08/30/2016  . Analgesic rebound headache 06/24/2016  . Esophageal dysphagia 06/21/2016  . Tobacco dependence 01/20/2016  . Plantar fasciitis, bilateral 12/24/2015  . 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 Diabetes type 2, controlled ([place]) 06/19/2015  . Essential hypertension 06/19/2015  . Vitamin D deficiency 09/24/2013  . Hyperlipidemia 09/24/2013  . Migraine 05/15/2013  . Low back pain 08/18/2012     Current Outpatient Medications on File Prior to Visit  Medication Sig Dispense Refill  . ACCU-CHEK SOFTCLIX LANCETS lancets Use as instructed 100 each 12  . amLODipine (NORVASC) 5 MG tablet Take 1 tablet (5 mg total) by mouth daily. 90 tablet 3  . aspirin 325 MG tablet Take 650 mg by mouth  every 6 (six) hours as needed. For headache    . Blood Glucose Monitoring Suppl (ACCU-CHEK AVIVA) device Use as instructed 1 each 0  . Blood Glucose Monitoring Suppl (TRUE METRIX METER) w/Device KIT Use as directed 1 kit 0  . divalproex (DEPAKOTE ER) 250 MG 24 hr tablet Take 1 tablet (250 mg total) by mouth at bedtime. 30 tablet 11  . fluticasone (FLONASE) 50 MCG/ACT nasal spray Place 1 spray into both nostrils daily. 16 g 2  . gabapentin (NEURONTIN) 300 MG capsule Take 1 capsule (300 mg total) by mouth 2 (two) times daily. 60 capsule 5  . glucose blood (ACCU-CHEK AVIVA) test strip Use as instructed 100 each 12  . glucose blood (TRUE METRIX BLOOD GLUCOSE TEST) test strip Use as instructed 100 each 12  . ibuprofen (ADVIL,MOTRIN) 800 MG tablet Take 1 tablet (800 mg total) by mouth every 8 (eight) hours as needed. 21 tablet 0  . lisinopril (PRINIVIL,ZESTRIL) 40 MG tablet Take 1 tablet (40 mg total) by mouth daily. 30 tablet 4  . meloxicam (MOBIC) 15 MG tablet Take 1 tablet (15 mg total) by mouth daily. 30 tablet 2  . metFORMIN (GLUCOPHAGE) 1000 MG tablet Take 1 tablet (1,000 mg total) by mouth 2 (two) times daily with a meal. 60 tablet 2  . SUMAtriptan (IMITREX) 100 MG tablet Take 1 tablet (100 mg total) by mouth as needed for migraine. May repeat in 2 hours if headache persists or recurs.  Do not refill  in less than 30 days 10 tablet 6  . traMADol (ULTRAM) 50 MG tablet Take 1 tablet (50 mg total) by mouth every 6 (six) hours as needed for severe pain. 15 tablet 0  . TRUEPLUS LANCETS 28G MISC Use as directed 100 each 1  . venlafaxine (EFFEXOR) 37.5 MG tablet Take 1 tablet (37.5 mg total) by mouth 2 (two) times daily with a meal. 60 tablet 11  . VOLTAREN 1 % GEL Apply 2 g topically 4 (four) times daily. 100 g 0   No current facility-administered medications on file prior to visit.     Allergies  Allergen Reactions  . Elavil [Amitriptyline Hcl]     GI upset    Social History   Socioeconomic  History  . Marital status: Married    Spouse name: Not on file  . Number of children: Not on file  . Years of education: Not on file  . Highest education level: Not on file  Occupational History  . Not on file  Social Needs  . Financial resource strain: Not on file  . Food insecurity:    Worry: Not on file    Inability: Not on file  . Transportation needs:    Medical: Not on file    Non-medical: Not on file  Tobacco Use  . Smoking status: Current Some Day Smoker  . Smokeless tobacco: Never Used  Substance and Sexual Activity  . Alcohol use: No  . Drug use: No  . Sexual activity: Not on file  Lifestyle  . Physical activity:    Days per week: Not on file    Minutes per session: Not on file  . Stress: Not on file  Relationships  . Social connections:    Talks on phone: Not on file    Gets together: Not on file    Attends religious service: Not on file    Active member of club or organization: Not on file    Attends meetings of clubs or organizations: Not on file    Relationship status: Not on file  . Intimate partner violence:    Fear of current or ex partner: Not on file    Emotionally abused: Not on file    Physically abused: Not on file    Forced sexual activity: Not on file  Other Topics Concern  . Not on file  Social History Narrative  . Not on file    Family History  Problem Relation Age of Onset  . Migraines Mother   . Breast cancer Neg Hx     Past Surgical History:  Procedure Laterality Date  . no previous surgeries      ROS: Review of Systems Negative except as above PHYSICAL EXAM: BP 119/75   Pulse 83   Temp 98.1 F (36.7 C) (Oral)   Resp 16   Wt 151 lb (68.5 kg)   SpO2 98%   BMI 29.49 kg/m   Physical Exam BP sitting 120/65, BP standing 125/60 General appearance - pt awake but looks tired Neck - no cervical LN, no thyroid enlargement Chest - clear to auscultation, no wheezes, rales or rhonchi, symmetric air entry Heart - normal rate,  regular rhythm, normal S1, S2, no murmurs, rubs, clicks or gallops Neurological - cranial nerves II through XII intact, mild swaying with Romberg, intermittently holds onto walls to steady self when walking and with turns. Power UEs: some difficulty in getting pt to follow commands.  She appears to have some pain in RT wrist  and shoulder and gives to pain.  LUE 4+/5.  LEs 4+/5 BL.  Gross sensation intact Extremities - no LE edema   Results for orders placed or performed in visit on 06/21/17  POCT glycosylated hemoglobin (Hb A1C)  Result Value Ref Range   Hemoglobin A1C  4.0 - 5.6 %   HbA1c, POC (prediabetic range)  5.7 - 6.4 %   HbA1c, POC (controlled diabetic range) 7.5 (A) 0.0 - 7.0 %  POCT glucose (manual entry)  Result Value Ref Range   POC Glucose 270 (A) 70 - 99 mg/dl    ASSESSMENT AND PLAN: 1. Dizziness 2. Vertigo of central origin, unspecified laterality -given hx and gait disturbance observe, will get CT of head to r/o any acute/subacute event Smoking cessation encouraged - CBC - Comprehensive metabolic panel - CT Head Wo Contrast; Future  3. Diabetes mellitus type 2, uncontrolled, with complications ([place]) Not at goal Continue Metformin.  Add low dose Amaryl - POCT glycosylated hemoglobin (Hb A1C) - POCT glucose (manual entry) - Microalbumin / creatinine urine ratio - glimepiride (AMARYL) 1 MG tablet; Take 1 tablet (1 mg total) by mouth daily before breakfast.  Dispense: 30 tablet; Refill: 6  4. Essential hypertension -at goal   Patient was given the opportunity to ask questions.  Patient verbalized understanding of the plan and was able to repeat key elements of the plan.   Orders Placed This Encounter  Procedures  . CT Head Wo Contrast  . Microalbumin / creatinine urine ratio  . CBC  . Comprehensive metabolic panel  . POCT glycosylated hemoglobin (Hb A1C)  . POCT glucose (manual entry)     Requested Prescriptions   Signed Prescriptions Disp Refills  .  glimepiride (AMARYL) 1 MG tablet 30 tablet 6    Sig: Take 1 tablet (1 mg total) by mouth daily before breakfast.    Return in about 3 months (around 09/21/2017).  Karle Plumber, MD, FACP

## 2017-06-22 ENCOUNTER — Ambulatory Visit (HOSPITAL_COMMUNITY)
Admission: RE | Admit: 2017-06-22 | Discharge: 2017-06-22 | Disposition: A | Discharge status: Home / Self Care | Payer: Medicaid Other | Source: Ambulatory Visit | Attending: Internal Medicine | Admitting: Internal Medicine

## 2017-06-22 ENCOUNTER — Telehealth: Payer: Self-pay

## 2017-06-22 ENCOUNTER — Other Ambulatory Visit: Payer: Self-pay | Admitting: Internal Medicine

## 2017-06-22 DIAGNOSIS — H8149 Vertigo of central origin, unspecified ear: Secondary | ICD-10-CM | POA: Diagnosis not present

## 2017-06-22 DIAGNOSIS — R748 Abnormal levels of other serum enzymes: Secondary | ICD-10-CM

## 2017-06-22 DIAGNOSIS — H814 Vertigo of central origin: Secondary | ICD-10-CM

## 2017-06-22 LAB — COMPREHENSIVE METABOLIC PANEL
ALK PHOS: 103 IU/L (ref 39–117)
ALT: 50 IU/L — ABNORMAL HIGH (ref 0–32)
AST: 44 IU/L — ABNORMAL HIGH (ref 0–40)
Albumin/Globulin Ratio: 1.8 (ref 1.2–2.2)
Albumin: 4.6 g/dL (ref 3.5–5.5)
BUN/Creatinine Ratio: 17 (ref 9–23)
BUN: 10 mg/dL (ref 6–24)
Bilirubin Total: <0.2 mg/dL (ref 0.0–1.2)
CALCIUM: 8.8 mg/dL (ref 8.7–10.2)
CO2: 21 mmol/L (ref 20–29)
CREATININE: 0.59 mg/dL (ref 0.57–1.00)
Chloride: 107 mmol/L — ABNORMAL HIGH (ref 96–106)
GFR calc Af Amer: 119 mL/min/{1.73_m2} (ref >59)
GFR, EST NON AFRICAN AMERICAN: 104 mL/min/{1.73_m2} (ref >59)
GLOBULIN, TOTAL: 2.5 g/dL (ref 1.5–4.5)
GLUCOSE: 203 mg/dL — AB (ref 65–99)
Potassium: 4.2 mmol/L (ref 3.5–5.2)
SODIUM: 142 mmol/L (ref 134–144)
Total Protein: 7.1 g/dL (ref 6.0–8.5)

## 2017-06-22 LAB — CBC
HEMATOCRIT: 37.4 % (ref 34.0–46.6)
HEMOGLOBIN: 12 g/dL (ref 11.1–15.9)
MCH: 27 pg (ref 26.6–33.0)
MCHC: 32.1 g/dL (ref 31.5–35.7)
MCV: 84 fL (ref 79–97)
Platelets: 255 10*3/uL (ref 150–450)
RBC: 4.44 x10E6/uL (ref 3.77–5.28)
RDW: 14 % (ref 12.3–15.4)
WBC: 7 10*3/uL (ref 3.4–10.8)

## 2017-06-22 LAB — MICROALBUMIN / CREATININE URINE RATIO
Creatinine, Urine: 26.2 mg/dL
MICROALB/CREAT RATIO: 43.5 mg/g{creat} — AB (ref 0.0–30.0)
MICROALBUM., U, RANDOM: 11.4 ug/mL

## 2017-06-22 NOTE — Addendum Note (Signed)
Addended by: Octaviano Glow on: 06/22/2017 12:18 PM   Modules accepted: Orders

## 2017-06-22 NOTE — Telephone Encounter (Signed)
Contacted [employer] and spoke to [employer] intake representative    CPT- [phone] CT head wo contrast ICD- H81.49 Vertigo of central origin    Case # [id number]   Gave all clinical information and they are needing additional information    Asked to speak with the clinical reviewer and spoke with Seth Bake gave all additional clinical information   Authorization # [id number] Effective 06-22-2017 Expires 07-22-2017  Contacted the scheduling department and spoke with Manuela Schwartz and gave authorization

## 2017-06-23 ENCOUNTER — Telehealth: Payer: Self-pay

## 2017-06-23 NOTE — Telephone Encounter (Signed)
Contacted pt to go over CT results spoke with pt daughter in law and made aware and she doesn't have any questions or concerns

## 2017-06-24 LAB — HEPATITIS C ANTIBODY: Hep C Virus Ab: <0.1 s/co ratio (ref 0.0–0.9)

## 2017-06-24 LAB — SPECIMEN STATUS REPORT

## 2017-07-18 ENCOUNTER — Telehealth: Payer: Self-pay

## 2017-07-18 NOTE — Telephone Encounter (Signed)
SCAT application received in the mail.  Call placed to the patient with the assistance of [place] interpreter # [phone] with [employer]. April Mcmahon answered and stated that the patient was not home.  April Mcmahon is noted on the SCAT application as the emergency contact.  Informed her that the patient needs to sign the application in 2 places before it can be submitted. Explained to her that the application will be left at the front dest for her sign.   April Mcmahon stated that [name] would come to sign the application tomorrow. This CM instructed her to ask for Opal Sidles when she comes to the clinic. April Mcmahon stated that she understood and would be at the clinic tomorrow.

## 2017-08-01 DIAGNOSIS — G43109 Migraine with aura, not intractable, without status migrainosus: Secondary | ICD-10-CM | POA: Diagnosis not present

## 2017-08-02 DIAGNOSIS — G43109 Migraine with aura, not intractable, without status migrainosus: Secondary | ICD-10-CM | POA: Diagnosis not present

## 2017-08-03 DIAGNOSIS — G43109 Migraine with aura, not intractable, without status migrainosus: Secondary | ICD-10-CM | POA: Diagnosis not present

## 2017-08-04 DIAGNOSIS — G43109 Migraine with aura, not intractable, without status migrainosus: Secondary | ICD-10-CM | POA: Diagnosis not present

## 2017-08-05 ENCOUNTER — Telehealth: Payer: Self-pay

## 2017-08-05 DIAGNOSIS — G43109 Migraine with aura, not intractable, without status migrainosus: Secondary | ICD-10-CM | POA: Diagnosis not present

## 2017-08-05 NOTE — Telephone Encounter (Signed)
Met with the patient and her daughter, April Mcmahon, in the clinic today. Completed SCAT application and the application was faxed to SCAT eligibility.

## 2017-08-06 DIAGNOSIS — G43109 Migraine with aura, not intractable, without status migrainosus: Secondary | ICD-10-CM | POA: Diagnosis not present

## 2017-08-07 DIAGNOSIS — G43109 Migraine with aura, not intractable, without status migrainosus: Secondary | ICD-10-CM | POA: Diagnosis not present

## 2017-08-08 DIAGNOSIS — G43109 Migraine with aura, not intractable, without status migrainosus: Secondary | ICD-10-CM | POA: Diagnosis not present

## 2017-08-09 DIAGNOSIS — G43109 Migraine with aura, not intractable, without status migrainosus: Secondary | ICD-10-CM | POA: Diagnosis not present

## 2017-08-10 DIAGNOSIS — G43109 Migraine with aura, not intractable, without status migrainosus: Secondary | ICD-10-CM | POA: Diagnosis not present

## 2017-08-11 DIAGNOSIS — G43109 Migraine with aura, not intractable, without status migrainosus: Secondary | ICD-10-CM | POA: Diagnosis not present

## 2017-08-12 DIAGNOSIS — G43109 Migraine with aura, not intractable, without status migrainosus: Secondary | ICD-10-CM | POA: Diagnosis not present

## 2017-08-13 DIAGNOSIS — G43109 Migraine with aura, not intractable, without status migrainosus: Secondary | ICD-10-CM | POA: Diagnosis not present

## 2017-08-14 DIAGNOSIS — G43109 Migraine with aura, not intractable, without status migrainosus: Secondary | ICD-10-CM | POA: Diagnosis not present

## 2017-08-15 DIAGNOSIS — G43109 Migraine with aura, not intractable, without status migrainosus: Secondary | ICD-10-CM | POA: Diagnosis not present

## 2017-08-16 DIAGNOSIS — G43109 Migraine with aura, not intractable, without status migrainosus: Secondary | ICD-10-CM | POA: Diagnosis not present

## 2017-08-17 DIAGNOSIS — G43109 Migraine with aura, not intractable, without status migrainosus: Secondary | ICD-10-CM | POA: Diagnosis not present

## 2017-08-18 DIAGNOSIS — G43109 Migraine with aura, not intractable, without status migrainosus: Secondary | ICD-10-CM | POA: Diagnosis not present

## 2017-08-19 DIAGNOSIS — G43109 Migraine with aura, not intractable, without status migrainosus: Secondary | ICD-10-CM | POA: Diagnosis not present

## 2017-08-20 DIAGNOSIS — G43109 Migraine with aura, not intractable, without status migrainosus: Secondary | ICD-10-CM | POA: Diagnosis not present

## 2017-08-21 DIAGNOSIS — G43109 Migraine with aura, not intractable, without status migrainosus: Secondary | ICD-10-CM | POA: Diagnosis not present

## 2017-08-22 DIAGNOSIS — G43109 Migraine with aura, not intractable, without status migrainosus: Secondary | ICD-10-CM | POA: Diagnosis not present

## 2017-08-23 DIAGNOSIS — G43109 Migraine with aura, not intractable, without status migrainosus: Secondary | ICD-10-CM | POA: Diagnosis not present

## 2017-08-24 DIAGNOSIS — G43109 Migraine with aura, not intractable, without status migrainosus: Secondary | ICD-10-CM | POA: Diagnosis not present

## 2017-08-25 DIAGNOSIS — G43109 Migraine with aura, not intractable, without status migrainosus: Secondary | ICD-10-CM | POA: Diagnosis not present

## 2017-08-26 DIAGNOSIS — G43109 Migraine with aura, not intractable, without status migrainosus: Secondary | ICD-10-CM | POA: Diagnosis not present

## 2017-08-27 DIAGNOSIS — G43109 Migraine with aura, not intractable, without status migrainosus: Secondary | ICD-10-CM | POA: Diagnosis not present

## 2017-08-28 DIAGNOSIS — G43109 Migraine with aura, not intractable, without status migrainosus: Secondary | ICD-10-CM | POA: Diagnosis not present

## 2017-08-29 DIAGNOSIS — G43109 Migraine with aura, not intractable, without status migrainosus: Secondary | ICD-10-CM | POA: Diagnosis not present

## 2017-08-30 DIAGNOSIS — G43109 Migraine with aura, not intractable, without status migrainosus: Secondary | ICD-10-CM | POA: Diagnosis not present

## 2017-08-31 DIAGNOSIS — G43109 Migraine with aura, not intractable, without status migrainosus: Secondary | ICD-10-CM | POA: Diagnosis not present

## 2017-09-01 DIAGNOSIS — G43109 Migraine with aura, not intractable, without status migrainosus: Secondary | ICD-10-CM | POA: Diagnosis not present

## 2017-09-02 DIAGNOSIS — G43109 Migraine with aura, not intractable, without status migrainosus: Secondary | ICD-10-CM | POA: Diagnosis not present

## 2017-09-03 DIAGNOSIS — G43109 Migraine with aura, not intractable, without status migrainosus: Secondary | ICD-10-CM | POA: Diagnosis not present

## 2017-09-04 DIAGNOSIS — G43109 Migraine with aura, not intractable, without status migrainosus: Secondary | ICD-10-CM | POA: Diagnosis not present

## 2017-09-05 DIAGNOSIS — G43109 Migraine with aura, not intractable, without status migrainosus: Secondary | ICD-10-CM | POA: Diagnosis not present

## 2017-09-06 DIAGNOSIS — G43109 Migraine with aura, not intractable, without status migrainosus: Secondary | ICD-10-CM | POA: Diagnosis not present

## 2017-09-07 DIAGNOSIS — G43109 Migraine with aura, not intractable, without status migrainosus: Secondary | ICD-10-CM | POA: Diagnosis not present

## 2017-09-08 DIAGNOSIS — G43109 Migraine with aura, not intractable, without status migrainosus: Secondary | ICD-10-CM | POA: Diagnosis not present

## 2017-09-09 DIAGNOSIS — G43109 Migraine with aura, not intractable, without status migrainosus: Secondary | ICD-10-CM | POA: Diagnosis not present

## 2017-09-10 DIAGNOSIS — G43109 Migraine with aura, not intractable, without status migrainosus: Secondary | ICD-10-CM | POA: Diagnosis not present

## 2017-09-11 DIAGNOSIS — G43109 Migraine with aura, not intractable, without status migrainosus: Secondary | ICD-10-CM | POA: Diagnosis not present

## 2017-09-12 DIAGNOSIS — G43109 Migraine with aura, not intractable, without status migrainosus: Secondary | ICD-10-CM | POA: Diagnosis not present

## 2017-09-13 DIAGNOSIS — G43109 Migraine with aura, not intractable, without status migrainosus: Secondary | ICD-10-CM | POA: Diagnosis not present

## 2017-09-14 DIAGNOSIS — G43109 Migraine with aura, not intractable, without status migrainosus: Secondary | ICD-10-CM | POA: Diagnosis not present

## 2017-09-15 DIAGNOSIS — G43109 Migraine with aura, not intractable, without status migrainosus: Secondary | ICD-10-CM | POA: Diagnosis not present

## 2017-09-16 DIAGNOSIS — G43109 Migraine with aura, not intractable, without status migrainosus: Secondary | ICD-10-CM | POA: Diagnosis not present

## 2017-09-17 DIAGNOSIS — G43109 Migraine with aura, not intractable, without status migrainosus: Secondary | ICD-10-CM | POA: Diagnosis not present

## 2017-09-18 DIAGNOSIS — G43109 Migraine with aura, not intractable, without status migrainosus: Secondary | ICD-10-CM | POA: Diagnosis not present

## 2017-09-19 DIAGNOSIS — G43109 Migraine with aura, not intractable, without status migrainosus: Secondary | ICD-10-CM | POA: Diagnosis not present

## 2017-09-20 DIAGNOSIS — G43109 Migraine with aura, not intractable, without status migrainosus: Secondary | ICD-10-CM | POA: Diagnosis not present

## 2017-09-21 DIAGNOSIS — G43109 Migraine with aura, not intractable, without status migrainosus: Secondary | ICD-10-CM | POA: Diagnosis not present

## 2017-09-22 ENCOUNTER — Ambulatory Visit: Payer: Medicaid Other | Attending: Internal Medicine | Admitting: Internal Medicine

## 2017-09-22 ENCOUNTER — Encounter: Payer: Self-pay | Admitting: Internal Medicine

## 2017-09-22 VITALS — BP 126/68 | HR 88 | Temp 98.5°F | Resp 16 | Wt 156.0 lb

## 2017-09-22 DIAGNOSIS — M170 Bilateral primary osteoarthritis of knee: Secondary | ICD-10-CM | POA: Diagnosis not present

## 2017-09-22 DIAGNOSIS — E1165 Type 2 diabetes mellitus with hyperglycemia: Secondary | ICD-10-CM | POA: Diagnosis not present

## 2017-09-22 DIAGNOSIS — Z79899 Other long term (current) drug therapy: Secondary | ICD-10-CM | POA: Diagnosis not present

## 2017-09-22 DIAGNOSIS — E1142 Type 2 diabetes mellitus with diabetic polyneuropathy: Secondary | ICD-10-CM | POA: Insufficient documentation

## 2017-09-22 DIAGNOSIS — R945 Abnormal results of liver function studies: Secondary | ICD-10-CM | POA: Insufficient documentation

## 2017-09-22 DIAGNOSIS — E785 Hyperlipidemia, unspecified: Secondary | ICD-10-CM | POA: Diagnosis not present

## 2017-09-22 DIAGNOSIS — E559 Vitamin D deficiency, unspecified: Secondary | ICD-10-CM | POA: Diagnosis not present

## 2017-09-22 DIAGNOSIS — E118 Type 2 diabetes mellitus with unspecified complications: Secondary | ICD-10-CM

## 2017-09-22 DIAGNOSIS — Z7984 Long term (current) use of oral hypoglycemic drugs: Secondary | ICD-10-CM | POA: Diagnosis not present

## 2017-09-22 DIAGNOSIS — IMO0002 Reserved for concepts with insufficient information to code with codable children: Secondary | ICD-10-CM

## 2017-09-22 DIAGNOSIS — M79671 Pain in right foot: Secondary | ICD-10-CM | POA: Diagnosis not present

## 2017-09-22 DIAGNOSIS — Z7982 Long term (current) use of aspirin: Secondary | ICD-10-CM | POA: Insufficient documentation

## 2017-09-22 DIAGNOSIS — G8929 Other chronic pain: Secondary | ICD-10-CM | POA: Diagnosis not present

## 2017-09-22 DIAGNOSIS — W19XXXA Unspecified fall, initial encounter: Secondary | ICD-10-CM | POA: Diagnosis not present

## 2017-09-22 DIAGNOSIS — G43109 Migraine with aura, not intractable, without status migrainosus: Secondary | ICD-10-CM | POA: Diagnosis not present

## 2017-09-22 DIAGNOSIS — M25561 Pain in right knee: Secondary | ICD-10-CM

## 2017-09-22 DIAGNOSIS — I10 Essential (primary) hypertension: Secondary | ICD-10-CM

## 2017-09-22 DIAGNOSIS — Z888 Allergy status to other drugs, medicaments and biological substances status: Secondary | ICD-10-CM | POA: Insufficient documentation

## 2017-09-22 DIAGNOSIS — Z791 Long term (current) use of non-steroidal anti-inflammatories (NSAID): Secondary | ICD-10-CM | POA: Diagnosis not present

## 2017-09-22 DIAGNOSIS — G43909 Migraine, unspecified, not intractable, without status migrainosus: Secondary | ICD-10-CM | POA: Diagnosis not present

## 2017-09-22 DIAGNOSIS — F17200 Nicotine dependence, unspecified, uncomplicated: Secondary | ICD-10-CM | POA: Insufficient documentation

## 2017-09-22 DIAGNOSIS — R7989 Other specified abnormal findings of blood chemistry: Secondary | ICD-10-CM

## 2017-09-22 LAB — GLUCOSE, POCT (MANUAL RESULT ENTRY): POC Glucose: 306 mg/dl — AB (ref 70–99)

## 2017-09-22 LAB — POCT GLYCOSYLATED HEMOGLOBIN (HGB A1C): HBA1C, POC (CONTROLLED DIABETIC RANGE): 7.5 % — AB (ref 0.0–7.0)

## 2017-09-22 MED ORDER — MELOXICAM 15 MG PO TABS: 15.0000 mg | ORAL_TABLET | Freq: Every day | ORAL | 2 refills | Status: DC

## 2017-09-22 MED ORDER — GLIMEPIRIDE 2 MG PO TABS: 2.0000 mg | ORAL_TABLET | Freq: Every day | ORAL | 6 refills | Status: DC

## 2017-09-22 NOTE — Progress Notes (Signed)
Patient ID: April Mcmahon, female    DOB: 1960-06-23  MRN: [id number]  CC: No chief complaint on file.   Subjective: April Mcmahon is a 57 y.o. female who presents for chronic ds management.  Daughter in law is with her  Her concerns today include:  Pt with hx of DMneuropathy, HTN, HL, tob dep, migraines, Vit D def, DJD knees and plantar fasciitis.  Fell yesterday due to pain in RT heel and knee Hx of plantar fasciitis.  Inj to BL feet earlier this year by podiatry.  She feels it helped decrease the pain in the left foot but not the right. +burning BL.  Reports compliance with gabapentin  DM: checks BID.  A.m range has pain in the 200s.  She is on Metformin.  Amaryl was added on last visit.  She has med bottles with her today and reports compliance. Overall she feels she is doing okay with her eating habits.  HTN: Out Lisinopril x 2 wks. she limits salt in the foods.  No chest pains or shortness of breath.  No lower extremity edema.  Blood test on last visit revealed mild elevation of AST and ALT likely due to fatty liver.  Hep C screen was negative Patient Active Problem List   Diagnosis Date Noted  . MCI (mild cognitive impairment) 02/14/2017  . Diabetic peripheral neuropathy ([place]) 02/14/2017  . Memory loss 12/27/2016  . Dysfunctional uterine bleeding 08/30/2016  . Analgesic rebound headache 06/24/2016  . Esophageal dysphagia 06/21/2016  . Tobacco dependence 01/20/2016  . Plantar fasciitis, bilateral 12/24/2015  . 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 Diabetes type 2, controlled ([place]) 06/19/2015  . Essential hypertension 06/19/2015  . Vitamin D deficiency 09/24/2013  . Hyperlipidemia 09/24/2013  . Migraine 05/15/2013  . Low back pain 08/18/2012     Current Outpatient Medications on File Prior to Visit  Medication Sig Dispense Refill  . ACCU-CHEK SOFTCLIX LANCETS lancets Use as instructed 100 each 12  . amLODipine (NORVASC) 5 MG tablet Take 1 tablet (5 mg  total) by mouth daily. 90 tablet 3  . aspirin 325 MG tablet Take 650 mg by mouth every 6 (six) hours as needed. For headache    . Blood Glucose Monitoring Suppl (ACCU-CHEK AVIVA) device Use as instructed 1 each 0  . Blood Glucose Monitoring Suppl (TRUE METRIX METER) w/Device KIT Use as directed 1 kit 0  . divalproex (DEPAKOTE ER) 250 MG 24 hr tablet Take 1 tablet (250 mg total) by mouth at bedtime. 30 tablet 11  . fluticasone (FLONASE) 50 MCG/ACT nasal spray Place 1 spray into both nostrils daily. 16 g 2  . gabapentin (NEURONTIN) 300 MG capsule Take 1 capsule (300 mg total) by mouth 2 (two) times daily. 60 capsule 5  . glucose blood (ACCU-CHEK AVIVA) test strip Use as instructed 100 each 12  . glucose blood (TRUE METRIX BLOOD GLUCOSE TEST) test strip Use as instructed 100 each 12  . ibuprofen (ADVIL,MOTRIN) 800 MG tablet Take 1 tablet (800 mg total) by mouth every 8 (eight) hours as needed. 21 tablet 0  . lisinopril (PRINIVIL,ZESTRIL) 40 MG tablet Take 1 tablet (40 mg total) by mouth daily. 30 tablet 4  . metFORMIN (GLUCOPHAGE) 1000 MG tablet Take 1 tablet (1,000 mg total) by mouth 2 (two) times daily with a meal. 60 tablet 2  . SUMAtriptan (IMITREX) 100 MG tablet Take 1 tablet (100 mg total) by mouth as needed for migraine. May repeat in 2 hours if  headache persists or recurs.  Do not refill in less than 30 days 10 tablet 6  . TRUEPLUS LANCETS 28G MISC Use as directed 100 each 1  . venlafaxine (EFFEXOR) 37.5 MG tablet Take 1 tablet (37.5 mg total) by mouth 2 (two) times daily with a meal. 60 tablet 11  . VOLTAREN 1 % GEL Apply 2 g topically 4 (four) times daily. 100 g 0   No current facility-administered medications on file prior to visit.     Allergies  Allergen Reactions  . Elavil [Amitriptyline Hcl]     GI upset    Social History   Socioeconomic History  . Marital status: Married    Spouse name: Not on file  . Number of children: Not on file  . Years of education: Not on file    . Highest education level: Not on file  Occupational History  . Not on file  Social Needs  . Financial resource strain: Not on file  . Food insecurity:    Worry: Not on file    Inability: Not on file  . Transportation needs:    Medical: Not on file    Non-medical: Not on file  Tobacco Use  . Smoking status: Current Some Day Smoker  . Smokeless tobacco: Never Used  Substance and Sexual Activity  . Alcohol use: No  . Drug use: No  . Sexual activity: Not on file  Lifestyle  . Physical activity:    Days per week: Not on file    Minutes per session: Not on file  . Stress: Not on file  Relationships  . Social connections:    Talks on phone: Not on file    Gets together: Not on file    Attends religious service: Not on file    Active member of club or organization: Not on file    Attends meetings of clubs or organizations: Not on file    Relationship status: Not on file  . Intimate partner violence:    Fear of current or ex partner: Not on file    Emotionally abused: Not on file    Physically abused: Not on file    Forced sexual activity: Not on file  Other Topics Concern  . Not on file  Social History Narrative  . Not on file    Family History  Problem Relation Age of Onset  . Migraines Mother   . Breast cancer Neg Hx     Past Surgical History:  Procedure Laterality Date  . no previous surgeries      ROS: Review of Systems Negative except as stated above PHYSICAL EXAM: BP 126/68   Pulse 88   Temp 98.5 F (36.9 C) (Oral)   Resp 16   Wt 156 lb (70.8 kg)   SpO2 96%   BMI 30.47 kg/m    Sitting BP 131/78, BP 130/72 standing Physical Exam  General appearance - alert, well appearing, and in no distress Mental status - normal mood, behavior, speech, dress, motor activity, and thought processes Mouth - mucous membranes moist, pharynx normal without lesions Neck - supple, no significant adenopathy Chest - clear to auscultation, no wheezes, rales or rhonchi,  symmetric air entry Heart - normal rate, regular rhythm, normal S1, S2, no murmurs, rubs, clicks or gallops Musculoskeletal -right knee: No edema or erythema.  No joint enlargement.  She has mild tenderness on palpation along the medial and lateral joint lines and discomfort with passive range of motion. She has cracking of the  skin on both heels.  Mild to moderate tenderness on palpation of the right heel. She ambulates with a cane Extremities -no lower extremity edema.  ASSESSMENT AND PLAN: 1. Diabetes mellitus type 2, uncontrolled, with complications ([facility]) Increase Amaryl to 2 mg daily.  Continue metformin.  Advised on healthy eating habits - POCT glucose (manual entry) - POCT glycosylated hemoglobin (Hb A1C) - glimepiride (AMARYL) 2 MG tablet; Take 1 tablet (2 mg total) by mouth daily before breakfast.  Dispense: 30 tablet; Refill: 6  2. Chronic pain of right knee - meloxicam (MOBIC) 15 MG tablet; Take 1 tablet (15 mg total) by mouth daily.  Dispense: 30 tablet; Refill: 2 Prescription given for Rollator walker  3. Pain of right heel - DG Foot Complete Right; Future - meloxicam (MOBIC) 15 MG tablet; Take 1 tablet (15 mg total) by mouth daily.  Dispense: 30 tablet; Refill: 2 - DG Knee Complete 4 Views Right; Future - Ambulatory referral to Podiatry  4. Fall, initial encounter See #2 above  5. Essential hypertension Blood pressure is controlled without the lisinopril so I will have her hold off on taking that for now.  She will continue the amlodipine  6. Abnormal LFTs - Hepatic Function Panel  Patient was given the opportunity to ask questions.  Patient verbalized understanding of the plan and was able to repeat key elements of the plan.   Orders Placed This Encounter  Procedures  . DG Foot Complete Right  . DG Knee Complete 4 Views Right  . Hepatic Function Panel  . Ambulatory referral to Podiatry  . POCT glucose (manual entry)  . POCT glycosylated hemoglobin (Hb A1C)      Requested Prescriptions   Signed Prescriptions Disp Refills  . glimepiride (AMARYL) 2 MG tablet 30 tablet 6    Sig: Take 1 tablet (2 mg total) by mouth daily before breakfast.  . meloxicam (MOBIC) 15 MG tablet 30 tablet 2    Sig: Take 1 tablet (15 mg total) by mouth daily.    No follow-ups on file.  Karle Plumber, MD, FACP

## 2017-09-22 NOTE — Progress Notes (Signed)
cbg 306 a1c 7.5

## 2017-09-22 NOTE — Patient Instructions (Signed)
Stop Lisinopril. Increase Amaryl to 2 mg daily.

## 2017-09-23 DIAGNOSIS — G43109 Migraine with aura, not intractable, without status migrainosus: Secondary | ICD-10-CM | POA: Diagnosis not present

## 2017-09-23 LAB — HEPATIC FUNCTION PANEL
ALT: 76 IU/L — ABNORMAL HIGH (ref 0–32)
AST: 87 IU/L — AB (ref 0–40)
Albumin: 4.7 g/dL (ref 3.5–5.5)
Alkaline Phosphatase: 121 IU/L — ABNORMAL HIGH (ref 39–117)
BILIRUBIN TOTAL: 0.3 mg/dL (ref 0.0–1.2)
Bilirubin, Direct: 0.1 mg/dL (ref 0.00–0.40)
TOTAL PROTEIN: 7.3 g/dL (ref 6.0–8.5)

## 2017-09-24 DIAGNOSIS — G43109 Migraine with aura, not intractable, without status migrainosus: Secondary | ICD-10-CM | POA: Diagnosis not present

## 2017-09-25 DIAGNOSIS — G43109 Migraine with aura, not intractable, without status migrainosus: Secondary | ICD-10-CM | POA: Diagnosis not present

## 2017-09-26 ENCOUNTER — Telehealth: Payer: Self-pay

## 2017-09-26 DIAGNOSIS — R7989 Other specified abnormal findings of blood chemistry: Secondary | ICD-10-CM

## 2017-09-26 DIAGNOSIS — G43109 Migraine with aura, not intractable, without status migrainosus: Secondary | ICD-10-CM | POA: Diagnosis not present

## 2017-09-26 DIAGNOSIS — R945 Abnormal results of liver function studies: Secondary | ICD-10-CM

## 2017-09-26 NOTE — Telephone Encounter (Signed)
April Mcmahon

## 2017-09-26 NOTE — Telephone Encounter (Signed)
Contacted pt daughter in law and went over lab results.   Dr. Wynetta Emery pt daughter in law states she doesn't think pt is taking depakote. I informed her that if she is to please stop medication.   Pt daughter in law doesn't have any questions or concerns

## 2017-09-27 ENCOUNTER — Telehealth: Payer: Self-pay

## 2017-09-27 DIAGNOSIS — G43109 Migraine with aura, not intractable, without status migrainosus: Secondary | ICD-10-CM | POA: Diagnosis not present

## 2017-09-27 NOTE — Telephone Encounter (Signed)
Went on the [employer] and did prior auth for [name]   CPT- 76705 US abdomen limited ICD- R94.5 Abnormal LFTs    Authorization # [id number] Effective- 09/27/2017 Expires- 10/27/2017  Contacted the scheduling department and spoke with Carolyne Fiscal to schedule [name] per Carolyne Fiscal the scan needs to be the right upper quadrant will need to inform Dr. Wynetta Emery so she can change procedure code. Once procedure is changed will do prior auth again for correct code.    Received message from Dr. Wynetta Emery informing me that she has changed the procedure. Contacted the scheduling department and spoke with Ginger and scheduled [name] RUQ and provided Auth #.   Pt [name] is scheduled for October 06, 2017 @1030am  @[facility]. Pt is to arrive @1015am . Pt is to be NPO after 12.   Contacted pt daughter-in-law and provided [name] appointment. Pt daughter-in-law doesn't have any questions or concerns  Didn't have to change CPT code because the [name] Dr. Wynetta Emery changed it to is the same cpt code

## 2017-09-27 NOTE — Addendum Note (Signed)
Addended by: Karle Plumber B on: 09/27/2017 07:49 AM   Modules accepted: Orders

## 2017-09-27 NOTE — Telephone Encounter (Signed)
Dr. Wynetta Emery called to schedule procedure there US abdomen limited needs to be changed to right upper quadrant. Once it is changed I will call and schedule and do prior auth

## 2017-09-27 NOTE — Telephone Encounter (Signed)
Call received from [facility] with [facility].  She noted that the patient missed her appointment for her assessment and has not called to re-schedule.

## 2017-09-28 ENCOUNTER — Other Ambulatory Visit: Payer: Self-pay | Admitting: Internal Medicine

## 2017-09-28 DIAGNOSIS — R7989 Other specified abnormal findings of blood chemistry: Secondary | ICD-10-CM

## 2017-09-28 DIAGNOSIS — R945 Abnormal results of liver function studies: Secondary | ICD-10-CM

## 2017-09-28 DIAGNOSIS — G43109 Migraine with aura, not intractable, without status migrainosus: Secondary | ICD-10-CM | POA: Diagnosis not present

## 2017-09-29 DIAGNOSIS — G43109 Migraine with aura, not intractable, without status migrainosus: Secondary | ICD-10-CM | POA: Diagnosis not present

## 2017-09-30 ENCOUNTER — Ambulatory Visit (HOSPITAL_COMMUNITY)
Admission: RE | Admit: 2017-09-30 | Discharge: 2017-09-30 | Disposition: A | Discharge status: Home / Self Care | Payer: Medicaid Other | Source: Ambulatory Visit | Attending: Internal Medicine | Admitting: Internal Medicine

## 2017-09-30 DIAGNOSIS — G43109 Migraine with aura, not intractable, without status migrainosus: Secondary | ICD-10-CM | POA: Diagnosis not present

## 2017-09-30 DIAGNOSIS — S99921A Unspecified injury of right foot, initial encounter: Secondary | ICD-10-CM | POA: Diagnosis not present

## 2017-09-30 DIAGNOSIS — M25561 Pain in right knee: Secondary | ICD-10-CM | POA: Diagnosis not present

## 2017-09-30 DIAGNOSIS — M79671 Pain in right foot: Secondary | ICD-10-CM

## 2017-09-30 DIAGNOSIS — S8991XA Unspecified injury of right lower leg, initial encounter: Secondary | ICD-10-CM | POA: Diagnosis not present

## 2017-10-01 DIAGNOSIS — G43109 Migraine with aura, not intractable, without status migrainosus: Secondary | ICD-10-CM | POA: Diagnosis not present

## 2017-10-02 DIAGNOSIS — G43109 Migraine with aura, not intractable, without status migrainosus: Secondary | ICD-10-CM | POA: Diagnosis not present

## 2017-10-03 DIAGNOSIS — G43109 Migraine with aura, not intractable, without status migrainosus: Secondary | ICD-10-CM | POA: Diagnosis not present

## 2017-10-04 ENCOUNTER — Telehealth: Payer: Self-pay

## 2017-10-04 DIAGNOSIS — G43109 Migraine with aura, not intractable, without status migrainosus: Secondary | ICD-10-CM | POA: Diagnosis not present

## 2017-10-04 NOTE — Telephone Encounter (Signed)
Contacted pt daughter in law and provided xray results. Pt daughter in law doesn't have any questions or concers

## 2017-10-05 DIAGNOSIS — G43109 Migraine with aura, not intractable, without status migrainosus: Secondary | ICD-10-CM | POA: Diagnosis not present

## 2017-10-06 ENCOUNTER — Ambulatory Visit (HOSPITAL_COMMUNITY): Payer: Medicaid Other

## 2017-10-06 DIAGNOSIS — G43109 Migraine with aura, not intractable, without status migrainosus: Secondary | ICD-10-CM | POA: Diagnosis not present

## 2017-10-07 ENCOUNTER — Ambulatory Visit: Payer: Medicaid Other | Admitting: Podiatry

## 2017-10-07 DIAGNOSIS — M79671 Pain in right foot: Secondary | ICD-10-CM

## 2017-10-07 DIAGNOSIS — M79672 Pain in left foot: Secondary | ICD-10-CM

## 2017-10-07 DIAGNOSIS — M722 Plantar fascial fibromatosis: Secondary | ICD-10-CM

## 2017-10-07 DIAGNOSIS — G43109 Migraine with aura, not intractable, without status migrainosus: Secondary | ICD-10-CM | POA: Diagnosis not present

## 2017-10-07 NOTE — Progress Notes (Signed)
Subjective:  Patient ID: April Mcmahon, female    DOB: [birth year],  MRN: [id number]  Chief Complaint  Patient presents with  . Plantar Fasciitis    right heel pain - left heel is ok, but right heel never really got better    57 y.o. female presents with the above complaint. States the left heel pain is improved but right never got better. Injection didn't help at all.   Review of Systems: Negative except as noted in the HPI. Denies N/V/F/Ch.  Past Medical History:  Diagnosis Date  . Bilateral chronic knee pain 06/2014  . Chronic low back pain 06/2014  . Chronic migraine 02/1985  . Diabetes mellitus without complication ([place]) 40/9811  . Hypertension 09/2013    Current Outpatient Medications:  .  ACCU-CHEK SOFTCLIX LANCETS lancets, Use as instructed, Disp: 100 each, Rfl: 12 .  amLODipine (NORVASC) 5 MG tablet, Take 1 tablet (5 mg total) by mouth daily., Disp: 90 tablet, Rfl: 3 .  aspirin 325 MG tablet, Take 650 mg by mouth every 6 (six) hours as needed. For headache, Disp: , Rfl:  .  Blood Glucose Monitoring Suppl (ACCU-CHEK AVIVA) device, Use as instructed, Disp: 1 each, Rfl: 0 .  Blood Glucose Monitoring Suppl (TRUE METRIX METER) w/Device KIT, Use as directed, Disp: 1 kit, Rfl: 0 .  divalproex (DEPAKOTE ER) 250 MG 24 hr tablet, Take 1 tablet (250 mg total) by mouth at bedtime., Disp: 30 tablet, Rfl: 11 .  fluticasone (FLONASE) 50 MCG/ACT nasal spray, Place 1 spray into both nostrils daily., Disp: 16 g, Rfl: 2 .  gabapentin (NEURONTIN) 300 MG capsule, Take 1 capsule (300 mg total) by mouth 2 (two) times daily., Disp: 60 capsule, Rfl: 5 .  glimepiride (AMARYL) 2 MG tablet, Take 1 tablet (2 mg total) by mouth daily before breakfast., Disp: 30 tablet, Rfl: 6 .  glucose blood (ACCU-CHEK AVIVA) test strip, Use as instructed, Disp: 100 each, Rfl: 12 .  glucose blood (TRUE METRIX BLOOD GLUCOSE TEST) test strip, Use as instructed, Disp: 100 each, Rfl: 12 .  ibuprofen (ADVIL,MOTRIN) 800 MG  tablet, Take 1 tablet (800 mg total) by mouth every 8 (eight) hours as needed., Disp: 21 tablet, Rfl: 0 .  lisinopril (PRINIVIL,ZESTRIL) 40 MG tablet, Take 1 tablet (40 mg total) by mouth daily., Disp: 30 tablet, Rfl: 4 .  meloxicam (MOBIC) 15 MG tablet, Take 1 tablet (15 mg total) by mouth daily., Disp: 30 tablet, Rfl: 2 .  metFORMIN (GLUCOPHAGE) 1000 MG tablet, Take 1 tablet (1,000 mg total) by mouth 2 (two) times daily with a meal., Disp: 60 tablet, Rfl: 2 .  SUMAtriptan (IMITREX) 100 MG tablet, Take 1 tablet (100 mg total) by mouth as needed for migraine. May repeat in 2 hours if headache persists or recurs.  Do not refill in less than 30 days, Disp: 10 tablet, Rfl: 6 .  TRUEPLUS LANCETS 28G MISC, Use as directed, Disp: 100 each, Rfl: 1 .  venlafaxine (EFFEXOR) 37.5 MG tablet, Take 1 tablet (37.5 mg total) by mouth 2 (two) times daily with a meal., Disp: 60 tablet, Rfl: 11 .  VOLTAREN 1 % GEL, Apply 2 g topically 4 (four) times daily., Disp: 100 g, Rfl: 0  Social History   Tobacco Use  Smoking Status Current Some Day Smoker  Smokeless Tobacco Never Used    Allergies  Allergen Reactions  . Elavil [Amitriptyline Hcl]     GI upset   Objective:  There were no vitals filed for this visit.  There is no height or weight on file to calculate BMI. Constitutional Well developed. Well nourished.  Vascular Dorsalis pedis pulses palpable bilaterally. Posterior tibial pulses palpable bilaterally. Capillary refill normal to all digits.  No cyanosis or clubbing noted. Pedal hair growth normal.  Neurologic Normal speech. Oriented to person, place, and time. Epicritic sensation to light touch grossly present bilaterally.  Dermatologic Nails well groomed and normal in appearance. No open wounds. No skin lesions.  Orthopedic: Normal joint ROM without pain or crepitus bilaterally. No visible deformities. Tender to palpation at the calcaneal tuber right. No pain with calcaneal squeeze  right. Ankle ROM diminished range of motion right. Silfverskiold Test: positive right.   Assessment:   1. Plantar fasciitis, right   2. Pain of left heel    Plan:  Patient was evaluated and treated and all questions answered.  Plantar Fasciitis, left -Resolved.  Plantar Fasciitis, right - Injection delivered to the plantar fascia as below.  Procedure: Injection Tendon/Ligament Location: Right plantar fascia at the glabrous junction; medial approach. Skin Prep: alcohol Injectate: 1 cc 0.5% marcaine plain, 1 cc dexamethasone phosphate, 0.5 cc kenalog 10. Disposition: Patient tolerated procedure well. Injection site dressed with a band-aid.  Return in about 6 weeks (around 11/18/2017) for Plantar fasciitis, Right.

## 2017-10-08 DIAGNOSIS — G43109 Migraine with aura, not intractable, without status migrainosus: Secondary | ICD-10-CM | POA: Diagnosis not present

## 2017-10-09 DIAGNOSIS — G43109 Migraine with aura, not intractable, without status migrainosus: Secondary | ICD-10-CM | POA: Diagnosis not present

## 2017-10-10 ENCOUNTER — Other Ambulatory Visit: Payer: Self-pay

## 2017-10-10 ENCOUNTER — Emergency Department (HOSPITAL_COMMUNITY)
Admission: EM | Admit: 2017-10-10 | Discharge: 2017-10-11 | Disposition: A | Discharge status: Home / Self Care | Payer: Medicaid Other | Attending: Emergency Medicine | Admitting: Emergency Medicine

## 2017-10-10 ENCOUNTER — Encounter (HOSPITAL_COMMUNITY): Payer: Self-pay | Admitting: Emergency Medicine

## 2017-10-10 ENCOUNTER — Emergency Department (HOSPITAL_COMMUNITY): Payer: Medicaid Other

## 2017-10-10 ENCOUNTER — Ambulatory Visit (HOSPITAL_COMMUNITY)
Admission: RE | Admit: 2017-10-10 | Discharge: 2017-10-10 | Disposition: A | Discharge status: Home / Self Care | Payer: Medicaid Other | Source: Ambulatory Visit | Attending: Internal Medicine | Admitting: Internal Medicine

## 2017-10-10 DIAGNOSIS — E119 Type 2 diabetes mellitus without complications: Secondary | ICD-10-CM | POA: Diagnosis not present

## 2017-10-10 DIAGNOSIS — G43109 Migraine with aura, not intractable, without status migrainosus: Secondary | ICD-10-CM | POA: Diagnosis not present

## 2017-10-10 DIAGNOSIS — R7989 Other specified abnormal findings of blood chemistry: Secondary | ICD-10-CM

## 2017-10-10 DIAGNOSIS — R51 Headache: Secondary | ICD-10-CM | POA: Insufficient documentation

## 2017-10-10 DIAGNOSIS — Z79899 Other long term (current) drug therapy: Secondary | ICD-10-CM | POA: Diagnosis not present

## 2017-10-10 DIAGNOSIS — F17210 Nicotine dependence, cigarettes, uncomplicated: Secondary | ICD-10-CM | POA: Diagnosis not present

## 2017-10-10 DIAGNOSIS — I10 Essential (primary) hypertension: Secondary | ICD-10-CM | POA: Insufficient documentation

## 2017-10-10 DIAGNOSIS — R945 Abnormal results of liver function studies: Secondary | ICD-10-CM | POA: Diagnosis not present

## 2017-10-10 DIAGNOSIS — R42 Dizziness and giddiness: Secondary | ICD-10-CM | POA: Diagnosis not present

## 2017-10-10 DIAGNOSIS — Z7984 Long term (current) use of oral hypoglycemic drugs: Secondary | ICD-10-CM | POA: Diagnosis not present

## 2017-10-10 DIAGNOSIS — Z7982 Long term (current) use of aspirin: Secondary | ICD-10-CM | POA: Insufficient documentation

## 2017-10-10 DIAGNOSIS — R55 Syncope and collapse: Secondary | ICD-10-CM | POA: Insufficient documentation

## 2017-10-10 DIAGNOSIS — R519 Headache, unspecified: Secondary | ICD-10-CM

## 2017-10-10 DIAGNOSIS — S0990XA Unspecified injury of head, initial encounter: Secondary | ICD-10-CM | POA: Diagnosis not present

## 2017-10-10 LAB — CBC
HCT: 41 % (ref 36.0–46.0)
Hemoglobin: 12.7 g/dL (ref 12.0–15.0)
MCH: 26.4 pg (ref 26.0–34.0)
MCHC: 31 g/dL (ref 30.0–36.0)
MCV: 85.2 fL (ref 78.0–100.0)
PLATELETS: 220 10*3/uL (ref 150–400)
RBC: 4.81 MIL/uL (ref 3.87–5.11)
RDW: 13.5 % (ref 11.5–15.5)
WBC: 9.7 10*3/uL (ref 4.0–10.5)

## 2017-10-10 LAB — BASIC METABOLIC PANEL
Anion gap: 12 (ref 5–15)
BUN: 10 mg/dL (ref 6–20)
CALCIUM: 9.2 mg/dL (ref 8.9–10.3)
CO2: 20 mmol/L — AB (ref 22–32)
CREATININE: 0.57 mg/dL (ref 0.44–1.00)
Chloride: 106 mmol/L (ref 98–111)
GFR calc non Af Amer: >60 mL/min (ref >60)
Glucose, Bld: 90 mg/dL (ref 70–99)
Potassium: 3.7 mmol/L (ref 3.5–5.1)
SODIUM: 138 mmol/L (ref 135–145)

## 2017-10-10 LAB — I-STAT BETA HCG BLOOD, ED (MC, WL, AP ONLY)

## 2017-10-10 NOTE — ED Triage Notes (Signed)
Pt had syncopal episode and fell hitting the back of her head. Brief LOC. Pt has hx of hypertension for which she takes medication as well as hx of migraine headache. No CP, SHOB. On headache (Pt does report currentoly having a migraine today)

## 2017-10-11 DIAGNOSIS — G43109 Migraine with aura, not intractable, without status migrainosus: Secondary | ICD-10-CM | POA: Diagnosis not present

## 2017-10-11 LAB — URINALYSIS, ROUTINE W REFLEX MICROSCOPIC
BILIRUBIN URINE: NEGATIVE
Glucose, UA: NEGATIVE mg/dL
Hgb urine dipstick: NEGATIVE
Ketones, ur: NEGATIVE mg/dL
Leukocytes, UA: NEGATIVE
NITRITE: NEGATIVE
PH: 6 (ref 5.0–8.0)
Protein, ur: NEGATIVE mg/dL
Specific Gravity, Urine: 1.01 (ref 1.005–1.030)

## 2017-10-11 MED ORDER — DEXAMETHASONE SODIUM PHOSPHATE 10 MG/ML IJ SOLN
10.0000 mg | Freq: Once | INTRAMUSCULAR | Status: AC
  Administered 2017-10-11: 10 mg via INTRAMUSCULAR
  Filled 2017-10-11: qty 1

## 2017-10-11 MED ORDER — KETOROLAC TROMETHAMINE 30 MG/ML IJ SOLN
30.0000 mg | Freq: Once | INTRAMUSCULAR | Status: AC
  Administered 2017-10-11: 30 mg via INTRAVENOUS
  Filled 2017-10-11: qty 1

## 2017-10-11 MED ORDER — DIPHENHYDRAMINE HCL 50 MG/ML IJ SOLN
25.0000 mg | Freq: Once | INTRAMUSCULAR | Status: AC
  Administered 2017-10-11: 25 mg via INTRAVENOUS
  Filled 2017-10-11: qty 1

## 2017-10-11 MED ORDER — METOCLOPRAMIDE HCL 5 MG/ML IJ SOLN
10.0000 mg | Freq: Once | INTRAMUSCULAR | Status: AC
  Administered 2017-10-11: 10 mg via INTRAVENOUS
  Filled 2017-10-11: qty 2

## 2017-10-11 MED ORDER — SODIUM CHLORIDE 0.9 % IV BOLUS
1000.0000 mL | Freq: Once | INTRAVENOUS | Status: AC
  Administered 2017-10-11: 1000 mL via INTRAVENOUS

## 2017-10-11 NOTE — ED Provider Notes (Signed)
[facility] EMERGENCY DEPARTMENT Provider Note   CSN: [id number] Arrival date & time: 10/10/17  1906     History   Chief Complaint Chief Complaint  Patient presents with  . Loss of Consciousness  . Fall    HPI April Mcmahon is a 57 y.o. female.  The history is provided by the patient and medical records.    57 y.o. F with hx of chronic back pain, DM, HTN, migraine headaches, presenting to the ED for headache.  Patient reports headache all day since waking, gradually worsening.  Headache is diffuse throughout her entire head, described as throbbing.  Reports associated photophobia and dizziness.  daughter reports she got dizzy this afternoon when she tried to get up and seemed to "pass out".  This was very brief but she did strike her head on the floor.  Headache has not necessarily worsened since head trauma, no new symptoms have developed.  Patient has longstanding history of migraines, takes sumatriptan which occasionally helps.   She has not had any chest pain or SOB.  No cardiac history.  Has not tried any additional medications for her symptoms.  Past Medical History:  Diagnosis Date  . Bilateral chronic knee pain 06/2014  . Chronic low back pain 06/2014  . Chronic migraine 02/1985  . Diabetes mellitus without complication ([place]) 49/8264  . Hypertension 09/2013    Patient Active Problem List   Diagnosis Date Noted  . MCI (mild cognitive impairment) 02/14/2017  . Diabetic peripheral neuropathy ([place]) 02/14/2017  . Memory loss 12/27/2016  . Dysfunctional uterine bleeding 08/30/2016  . Analgesic rebound headache 06/24/2016  . Esophageal dysphagia 06/21/2016  . Tobacco dependence 01/20/2016  . Plantar fasciitis, bilateral 12/24/2015  . 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 Diabetes type 2, controlled ([place]) 06/19/2015  . Essential hypertension 06/19/2015  . Vitamin D deficiency 09/24/2013  . Hyperlipidemia 09/24/2013  . Migraine 05/15/2013   . Low back pain 08/18/2012    Past Surgical History:  Procedure Laterality Date  . no previous surgeries       OB History   None      Home Medications    Prior to Admission medications   Medication Sig Start Date End Date Taking? Authorizing Provider  ACCU-CHEK SOFTCLIX LANCETS lancets Use as instructed 04/07/17   Ladell Pier, MD  amLODipine (NORVASC) 5 MG tablet Take 1 tablet (5 mg total) by mouth daily. 02/14/17   Ladell Pier, MD  aspirin 325 MG tablet Take 650 mg by mouth every 6 (six) hours as needed. For headache    [provider]  Blood Glucose Monitoring Suppl (ACCU-CHEK AVIVA) device Use as instructed 04/07/17 04/07/18  Ladell Pier, MD  Blood Glucose Monitoring Suppl (TRUE METRIX METER) w/Device KIT Use as directed 02/14/17   Ladell Pier, MD  divalproex (DEPAKOTE ER) 250 MG 24 hr tablet Take 1 tablet (250 mg total) by mouth at bedtime. 06/24/16   Melvenia Beam, MD  fluticasone (FLONASE) 50 MCG/ACT nasal spray Place 1 spray into both nostrils daily. 10/13/16   Argentina Donovan, PA-C  gabapentin (NEURONTIN) 300 MG capsule Take 1 capsule (300 mg total) by mouth 2 (two) times daily. 02/14/17   Ladell Pier, MD  glimepiride (AMARYL) 2 MG tablet Take 1 tablet (2 mg total) by mouth daily before breakfast. 09/22/17   Ladell Pier, MD  glucose blood (ACCU-CHEK AVIVA) test strip Use as instructed 04/07/17   Ladell Pier, MD  glucose blood (TRUE METRIX BLOOD GLUCOSE TEST) test strip Use as instructed 02/14/17   Ladell Pier, MD  ibuprofen (ADVIL,MOTRIN) 800 MG tablet Take 1 tablet (800 mg total) by mouth every 8 (eight) hours as needed. 04/09/17   Lawyer, Harrell Gave, PA-C  lisinopril (PRINIVIL,ZESTRIL) 40 MG tablet Take 1 tablet (40 mg total) by mouth daily. 02/04/17   Ladell Pier, MD  meloxicam (MOBIC) 15 MG tablet Take 1 tablet (15 mg total) by mouth daily. 09/22/17   Ladell Pier, MD  metFORMIN (GLUCOPHAGE) 1000 MG tablet  Take 1 tablet (1,000 mg total) by mouth 2 (two) times daily with a meal. 04/22/17   Ladell Pier, MD  SUMAtriptan (IMITREX) 100 MG tablet Take 1 tablet (100 mg total) by mouth as needed for migraine. May repeat in 2 hours if headache persists or recurs.  Do not refill in less than 30 days 02/09/17   Marcial Pacas, MD  TRUEPLUS LANCETS 28G MISC Use as directed 02/14/17   Ladell Pier, MD  venlafaxine [facility]) 37.5 MG tablet Take 1 tablet (37.5 mg total) by mouth 2 (two) times daily with a meal. 02/09/17   Marcial Pacas, MD  VOLTAREN 1 % GEL Apply 2 g topically 4 (four) times daily. 07/05/16   Boykin Nearing, MD    Family History Family History  Problem Relation Age of Onset  . Migraines Mother   . Breast cancer Neg Hx     Social History Social History   Tobacco Use  . Smoking status: Current Some Day Smoker  . Smokeless tobacco: Never Used  Substance Use Topics  . Alcohol use: No  . Drug use: No     Allergies   Elavil [amitriptyline hcl]   Review of Systems Review of Systems  Neurological: Positive for dizziness and headaches.  All other systems reviewed and are negative.    Physical Exam Updated Vital Signs BP (!) 152/64 (BP Location: Left Arm)   Pulse 63   Temp 97.8 F (36.6 C) (Oral)   Resp 16   SpO2 100%   Physical Exam  Constitutional: She is oriented to person, place, and time. She appears well-developed and well-nourished. No distress.  HENT:  Head: Normocephalic and atraumatic.  Right Ear: External ear normal.  Left Ear: External ear normal.  Head atraumatic in appearance without area of hematoma or skull depression  Eyes: Pupils are equal, round, and reactive to light. Conjunctivae and EOM are normal.  Neck: Normal range of motion and full passive range of motion without pain. Neck supple. No neck rigidity.  No rigidity, no meningismus  Cardiovascular: Normal rate, regular rhythm and normal heart sounds.  No murmur heard. Pulmonary/Chest: Effort  normal and breath sounds normal. No respiratory distress. She has no wheezes. She has no rhonchi.  Abdominal: Soft. Bowel sounds are normal. There is no tenderness. There is no guarding.  Musculoskeletal: Normal range of motion. She exhibits no edema.  Neurological: She is alert and oriented to person, place, and time. She has normal strength. She displays no tremor. No cranial nerve deficit or sensory deficit. She displays no seizure activity.  AAOx3, answering questions and following commands appropriately; equal strength UE and LE bilaterally; CN grossly intact; moves all extremities appropriately without ataxia; no focal neuro deficits or facial asymmetry appreciated  Skin: Skin is warm and dry. No rash noted. She is not diaphoretic.  Psychiatric: She has a normal mood and affect. Her behavior is normal. Thought content normal.  Nursing note and  vitals reviewed.    ED Treatments / Results  Labs (all labs ordered are listed, but only abnormal results are displayed) Labs Reviewed  BASIC METABOLIC PANEL - Abnormal; Notable for the following components:      Result Value   CO2 20 (*)    All other components within normal limits  URINALYSIS, ROUTINE W REFLEX MICROSCOPIC - Abnormal; Notable for the following components:   APPearance HAZY (*)    All other components within normal limits  CBC  I-STAT BETA HCG BLOOD, ED ([facility], [facility], [facility] ONLY)  CBG MONITORING, ED    EKG None  Radiology Ct Head Wo Contrast  Result Date: 10/10/2017 CLINICAL DATA:  Syncope. Fall. Head injury. Brief loss of consciousness. EXAM: CT HEAD WITHOUT CONTRAST TECHNIQUE: Contiguous axial images were obtained from the base of the skull through the vertex without intravenous contrast. COMPARISON:  06/22/2017 FINDINGS: Brain: There is no mass effect, midline shift, or acute intracranial hemorrhage. Brain parenchyma and ventricular system are within normal limits. Vascular: No hyperdense vessel or unexpected calcification.  Skull: Cranium is intact. Sinuses/Orbits: There is mucous material in the sphenoid sinuses as well as the left middle ethmoid air cell. Visualized maxillary and frontal sinuses are clear. Visualized mastoid air cells are clear. Other: Noncontributory. IMPRESSION: No acute intracranial pathology. Electronically Signed   By: Marybelle Killings M.D.   On: 10/10/2017 21:20    Procedures Procedures (including critical care time)  Medications Ordered in ED Medications - No data to display   Initial Impression / Assessment and Plan / ED Course  I have reviewed the triage vital signs and the nursing notes.  Pertinent labs & imaging results that were available during my care of the patient were reviewed by me and considered in my medical decision making (see chart for details).  57 year old female here with headache, dizziness, and brief loss of consciousness today.  She is awake, alert, fully oriented.  Vital signs are stable.  Headache has been ongoing all day since waking, progressively worsening.  Does have long-standing history of headaches, takes sumatriptan without much relief.  Neurologic exam here is nonfocal.  She has no clinical signs or symptoms suggestive of meningitis.  Labs and head CT obtained from triage which are both reassuring.  EKG without acute ischemic changes.  We will plan to treat with migraine cocktail and reassess.  Patient feeling better here after migraine cocktail.  Has been able to drift off to sleep.  Awoken and states she feels much better and feels she can rest comfortably at home.  VSS.  Appears stable for discharge.  Will have her follow-up with her primary care doctor.  She can return here for any new/acute changes.  Final Clinical Impressions(s) / ED Diagnoses   Final diagnoses:  Bad headache  Near syncope    ED Discharge Orders    None       Larene Pickett, PA-C 62/83/15 1761    Delora Fuel, MD 60/73/71 [phone]

## 2017-10-11 NOTE — Discharge Instructions (Signed)
Follow-up with your primary care doctor.  Continue your regular headache medications. Return to the ED for new or worsening symptoms.

## 2017-10-12 ENCOUNTER — Telehealth: Payer: Self-pay

## 2017-10-12 DIAGNOSIS — G43109 Migraine with aura, not intractable, without status migrainosus: Secondary | ICD-10-CM | POA: Diagnosis not present

## 2017-10-12 NOTE — Progress Notes (Signed)
Patient ID: April Mcmahon, female   DOB: 1961/01/20, 57 y.o.   MRN: [id number]     April Mcmahon, is a 57 y.o. female  YHC:[id number]  DVV:[id number]  DOB - 09/18/1960  Subjective:  Chief Complaint and HPI: April Mcmahon is a 57 y.o. female here today for a follow up visit After being seen in the ED 10/10/2017 for syncopal episode.  No further episodes.  +some HA.  Blood sugars running 200s to 400s and being checked tid.  Denies CP/SOB/dizziness  From A/P: 57 year old female here with headache, dizziness, and brief loss of consciousness today.  She is awake, alert, fully oriented.  Vital signs are stable.  Headache has been ongoing all day since waking, progressively worsening.  Does have long-standing history of headaches, takes sumatriptan without much relief.  Neurologic exam here is nonfocal.  She has no clinical signs or symptoms suggestive of meningitis.  Labs and head CT obtained from triage which are both reassuring.  EKG without acute ischemic changes.  We will plan to treat with migraine cocktail and reassess.  Patient feeling better here after migraine cocktail.  Has been able to drift off to sleep.  Awoken and states she feels much better and feels she can rest comfortably at home.  VSS.  Appears stable for discharge.  Will have her follow-up with her primary care doctor.  She can return here for any new/acute changes  ED/Hospital notes reviewed.    ROS:   Constitutional:  No f/c, No night sweats, No unexplained weight loss. EENT:  No vision changes, No blurry vision, No hearing changes. No mouth, throat, or ear problems.  Respiratory: No cough, No SOB Cardiac: No CP, no palpitations GI:  No abd pain, No N/V/D. GU: No Urinary s/sx Musculoskeletal: No joint pain Neuro: mild headache, no dizziness, no motor weakness.  Skin: No rash Endocrine:  No polydipsia. No polyuria.  Psych: Denies SI/HI  No problems updated.  ALLERGIES: Allergies  Allergen Reactions  . Elavil [Amitriptyline  Hcl]     GI upset    PAST MEDICAL HISTORY: Past Medical History:  Diagnosis Date  . Bilateral chronic knee pain 06/2014  . Chronic low back pain 06/2014  . Chronic migraine 02/1985  . Diabetes mellitus without complication ([place]) 62/6948  . Hypertension 09/2013    MEDICATIONS AT HOME: Prior to Admission medications   Medication Sig Start Date End Date Taking? Authorizing Provider  ACCU-CHEK SOFTCLIX LANCETS lancets Use as instructed 04/07/17  Yes Ladell Pier, MD  aspirin 325 MG tablet Take 650 mg by mouth every 6 (six) hours as needed. For headache   Yes [provider]  Blood Glucose Monitoring Suppl (ACCU-CHEK AVIVA) device Use as instructed 04/07/17 04/07/18 Yes Ladell Pier, MD  Blood Glucose Monitoring Suppl (TRUE METRIX METER) w/Device KIT Use as directed 02/14/17  Yes Ladell Pier, MD  divalproex (DEPAKOTE ER) 250 MG 24 hr tablet Take 1 tablet (250 mg total) by mouth at bedtime. 06/24/16  Yes Melvenia Beam, MD  fluticasone (FLONASE) 50 MCG/ACT nasal spray Place 1 spray into both nostrils daily. 10/13/16  Yes Argentina Donovan, PA-C  gabapentin (NEURONTIN) 300 MG capsule Take 1 capsule (300 mg total) by mouth 2 (two) times daily. 02/14/17  Yes Ladell Pier, MD  glucose blood (ACCU-CHEK AVIVA) test strip Use as instructed 04/07/17  Yes Ladell Pier, MD  glucose blood (TRUE METRIX BLOOD GLUCOSE TEST) test strip Use as instructed 02/14/17  Yes Ladell Pier, MD  ibuprofen (ADVIL,MOTRIN) 800 MG tablet Take 1 tablet (800 mg total) by mouth every 8 (eight) hours as needed. 04/09/17  Yes Lawyer, Harrell Gave, PA-C  lisinopril (PRINIVIL,ZESTRIL) 40 MG tablet Take 1 tablet (40 mg total) by mouth daily. 02/04/17  Yes Ladell Pier, MD  meloxicam (MOBIC) 15 MG tablet Take 1 tablet (15 mg total) by mouth daily. 09/22/17  Yes Ladell Pier, MD  metFORMIN (GLUCOPHAGE) 1000 MG tablet Take 1 tablet (1,000 mg total) by mouth 2 (two) times daily with a meal.  04/22/17  Yes Ladell Pier, MD  SUMAtriptan (IMITREX) 100 MG tablet Take 1 tablet (100 mg total) by mouth as needed for migraine. May repeat in 2 hours if headache persists or recurs.  Do not refill in less than 30 days 02/09/17  Yes Marcial Pacas, MD  TRUEPLUS LANCETS 28G MISC Use as directed 02/14/17  Yes Ladell Pier, MD  venlafaxine [facility]) 37.5 MG tablet Take 1 tablet (37.5 mg total) by mouth 2 (two) times daily with a meal. 02/09/17  Yes Marcial Pacas, MD  VOLTAREN 1 % GEL Apply 2 g topically 4 (four) times daily. 07/05/16  Yes Funches, Josalyn, MD  amLODipine (NORVASC) 10 MG tablet Take 1 tablet (10 mg total) by mouth daily. 10/13/17   Argentina Donovan, PA-C  glimepiride (AMARYL) 4 MG tablet Take 1 tablet (4 mg total) by mouth daily before breakfast. 10/13/17   Argentina Donovan, PA-C     Objective:  EXAM:   Vitals:   10/13/17 1630  BP: (!) 143/85  Pulse: 67  Resp: 18  Temp: 98.3 F (36.8 C)  TempSrc: Oral  SpO2: 97%  Weight: 154 lb (69.9 kg)  Height: _0  (1.499 m)    General appearance : A&OX3. NAD. Non-toxic-appearing HEENT: Atraumatic and Normocephalic.  PERRLA. EOM intact.   Neck: supple, no JVD. No cervical lymphadenopathy. No thyromegaly Chest/Lungs:  Breathing-non-labored, Good air entry bilaterally, breath sounds normal without rales, rhonchi, or wheezing  CVS: S1 S2 regular, no murmurs, gallops, rubs  Extremities: Bilateral Lower Ext shows no edema, both legs are warm to touch with = pulse throughout Neurology:  CN II-XII grossly intact, Non focal.   Psych:  TP linear. J/I WNL. Normal speech. Appropriate eye contact and affect.  Skin:  No Rash  Data Review Lab Results  Component Value Date   HGBA1C 7.5 (A) 09/22/2017   HGBA1C 7.5 (A) 06/21/2017   HGBA1C 6.7 02/14/2017     Assessment & Plan   1. Controlled type 2 diabetes mellitus with hyperglycemia, without long-term current use of insulin (HCC)-last A1C =7.5 on 09/22/2017 Uncontrolled-running 200s  to 400s.  Work on Manufacturing engineer.  Continue current regimen except for increasing amaryl from 2 to 79m.  Check sugars tid and bring log in 3 weeks - Glucose (CBG) - glimepiride (AMARYL) 4 MG tablet; Take 1 tablet (4 mg total) by mouth daily before breakfast.  Dispense: 30 tablet; Refill: 3  2. Essential hypertension Uncontrolled and having mild HA.  Will increase dose of amlodipine.   - amLODipine (NORVASC) 10 MG tablet; Take 1 tablet (10 mg total) by mouth daily.  Dispense: 90 tablet; Refill: 3  3. Encounter for examination following treatment at hospital No further episodes   Patient have been counseled extensively about nutrition and exercise  Return in about 3 weeks (around 11/03/2017) for blood sugar and BP check with LLurena Joiner  The patient was given clear instructions to go to ER or return to medical center if  symptoms don't improve, worsen or new problems develop. The patient verbalized understanding. The patient was told to call to get lab results if they haven't heard anything in the next week.     Freeman Caldron, PA-C [facility] [place], [place]   10/13/2017, 4:50 PM

## 2017-10-12 NOTE — Telephone Encounter (Signed)
Contacted pt daughter in law to go over [name] results no answer lvm asking pt daughter in law to give me a call at her earliest convenience to go over results

## 2017-10-13 ENCOUNTER — Ambulatory Visit: Payer: Medicaid Other | Attending: Internal Medicine | Admitting: Physician Assistant

## 2017-10-13 VITALS — BP 143/85 | HR 67 | Temp 98.3°F | Resp 18 | Ht 59.0 in | Wt 154.0 lb

## 2017-10-13 DIAGNOSIS — Z09 Encounter for follow-up examination after completed treatment for conditions other than malignant neoplasm: Secondary | ICD-10-CM | POA: Diagnosis not present

## 2017-10-13 DIAGNOSIS — G8929 Other chronic pain: Secondary | ICD-10-CM | POA: Diagnosis not present

## 2017-10-13 DIAGNOSIS — Z79899 Other long term (current) drug therapy: Secondary | ICD-10-CM | POA: Insufficient documentation

## 2017-10-13 DIAGNOSIS — M25562 Pain in left knee: Secondary | ICD-10-CM | POA: Insufficient documentation

## 2017-10-13 DIAGNOSIS — Z7982 Long term (current) use of aspirin: Secondary | ICD-10-CM | POA: Insufficient documentation

## 2017-10-13 DIAGNOSIS — R51 Headache: Secondary | ICD-10-CM | POA: Insufficient documentation

## 2017-10-13 DIAGNOSIS — E1165 Type 2 diabetes mellitus with hyperglycemia: Secondary | ICD-10-CM | POA: Diagnosis not present

## 2017-10-13 DIAGNOSIS — I10 Essential (primary) hypertension: Secondary | ICD-10-CM | POA: Diagnosis not present

## 2017-10-13 DIAGNOSIS — M545 Low back pain: Secondary | ICD-10-CM | POA: Diagnosis not present

## 2017-10-13 DIAGNOSIS — R55 Syncope and collapse: Secondary | ICD-10-CM | POA: Insufficient documentation

## 2017-10-13 DIAGNOSIS — Z7984 Long term (current) use of oral hypoglycemic drugs: Secondary | ICD-10-CM | POA: Insufficient documentation

## 2017-10-13 DIAGNOSIS — M25561 Pain in right knee: Secondary | ICD-10-CM | POA: Insufficient documentation

## 2017-10-13 DIAGNOSIS — G43109 Migraine with aura, not intractable, without status migrainosus: Secondary | ICD-10-CM | POA: Diagnosis not present

## 2017-10-13 LAB — GLUCOSE, POCT (MANUAL RESULT ENTRY): POC Glucose: 218 mg/dl — AB (ref 70–99)

## 2017-10-13 MED ORDER — AMLODIPINE BESYLATE 10 MG PO TABS: 10.0000 mg | ORAL_TABLET | Freq: Every day | ORAL | 3 refills | Status: DC

## 2017-10-13 MED ORDER — GLIMEPIRIDE 4 MG PO TABS: 4.0000 mg | ORAL_TABLET | Freq: Every day | ORAL | 3 refills | Status: DC

## 2017-10-13 NOTE — Patient Instructions (Signed)
Check blood sugars 3 times daily and record and bring to next visit 

## 2017-10-14 ENCOUNTER — Encounter (HOSPITAL_COMMUNITY): Payer: Self-pay

## 2017-10-14 ENCOUNTER — Ambulatory Visit (HOSPITAL_COMMUNITY)
Admission: EM | Admit: 2017-10-14 | Discharge: 2017-10-14 | Disposition: A | Discharge status: Home / Self Care | Payer: Medicaid Other | Attending: Family Medicine | Admitting: Family Medicine

## 2017-10-14 DIAGNOSIS — R51 Headache: Secondary | ICD-10-CM

## 2017-10-14 DIAGNOSIS — G8929 Other chronic pain: Secondary | ICD-10-CM

## 2017-10-14 DIAGNOSIS — G43109 Migraine with aura, not intractable, without status migrainosus: Secondary | ICD-10-CM | POA: Diagnosis not present

## 2017-10-14 MED ORDER — DEXAMETHASONE SODIUM PHOSPHATE 10 MG/ML IJ SOLN
10.0000 mg | Freq: Once | INTRAMUSCULAR | Status: AC
  Administered 2017-10-14: 10 mg via INTRAMUSCULAR

## 2017-10-14 MED ORDER — DEXAMETHASONE SODIUM PHOSPHATE 10 MG/ML IJ SOLN
INTRAMUSCULAR | Status: AC
  Filled 2017-10-14: qty 1

## 2017-10-14 MED ORDER — KETOROLAC TROMETHAMINE 60 MG/2ML IM SOLN
INTRAMUSCULAR | Status: AC
  Filled 2017-10-14: qty 2

## 2017-10-14 MED ORDER — METOCLOPRAMIDE HCL 5 MG/ML IJ SOLN
INTRAMUSCULAR | Status: AC
  Filled 2017-10-14: qty 2

## 2017-10-14 MED ORDER — METOCLOPRAMIDE HCL 5 MG/ML IJ SOLN
5.0000 mg | Freq: Once | INTRAMUSCULAR | Status: AC
  Administered 2017-10-14: 5 mg via INTRAMUSCULAR

## 2017-10-14 MED ORDER — KETOROLAC TROMETHAMINE 60 MG/2ML IM SOLN
60.0000 mg | Freq: Once | INTRAMUSCULAR | Status: AC
  Administered 2017-10-14: 60 mg via INTRAMUSCULAR

## 2017-10-14 NOTE — Discharge Instructions (Signed)
Increase gabapentin to 600 mg at bedtime and 300 mg in the morning

## 2017-10-14 NOTE — ED Triage Notes (Signed)
Pt presents with dizziness, leg pain and headache on and off since the 9th, when she went to the er.

## 2017-10-14 NOTE — ED Provider Notes (Signed)
[place]    CSN: [id number] Arrival date & time: 10/14/17  [facility]     History   Chief Complaint Chief Complaint  Patient presents with  . Dizziness    HPI April Mcmahon is a 57 y.o. female.  Patient has diabetes and diabetic neuropathy as well as chronic headache.  She was seen in the emergency room on 9 9 with similar symptoms and given a migraine cocktail which did provide some relief.  She takes gabapentin 300 mg twice daily for neuropathy and leg pain.   HPI  Past Medical History:  Diagnosis Date  . Bilateral chronic knee pain 06/2014  . Chronic low back pain 06/2014  . Chronic migraine 02/1985  . Diabetes mellitus without complication ([place]) 16/3845  . Hypertension 09/2013    Patient Active Problem List   Diagnosis Date Noted  . MCI (mild cognitive impairment) 02/14/2017  . Diabetic peripheral neuropathy ([place]) 02/14/2017  . Memory loss 12/27/2016  . Dysfunctional uterine bleeding 08/30/2016  . Analgesic rebound headache 06/24/2016  . Esophageal dysphagia 06/21/2016  . Tobacco dependence 01/20/2016  . Plantar fasciitis, bilateral 12/24/2015  . 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 Diabetes type 2, controlled ([place]) 06/19/2015  . Essential hypertension 06/19/2015  . Vitamin D deficiency 09/24/2013  . Hyperlipidemia 09/24/2013  . Migraine 05/15/2013  . Low back pain 08/18/2012    Past Surgical History:  Procedure Laterality Date  . no previous surgeries      OB History   None      Home Medications    Prior to Admission medications   Medication Sig Start Date End Date Taking? Authorizing Provider  ACCU-CHEK SOFTCLIX LANCETS lancets Use as instructed 04/07/17   Ladell Pier, MD  amLODipine (NORVASC) 10 MG tablet Take 1 tablet (10 mg total) by mouth daily. 10/13/17   Argentina Donovan, PA-C  aspirin 325 MG tablet Take 650 mg by mouth every 6 (six) hours as needed. For headache    [provider]  Blood Glucose  Monitoring Suppl (ACCU-CHEK AVIVA) device Use as instructed 04/07/17 04/07/18  Ladell Pier, MD  Blood Glucose Monitoring Suppl (TRUE METRIX METER) w/Device KIT Use as directed 02/14/17   Ladell Pier, MD  divalproex (DEPAKOTE ER) 250 MG 24 hr tablet Take 1 tablet (250 mg total) by mouth at bedtime. 06/24/16   Melvenia Beam, MD  fluticasone (FLONASE) 50 MCG/ACT nasal spray Place 1 spray into both nostrils daily. 10/13/16   Argentina Donovan, PA-C  gabapentin (NEURONTIN) 300 MG capsule Take 1 capsule (300 mg total) by mouth 2 (two) times daily. 02/14/17   Ladell Pier, MD  glimepiride (AMARYL) 4 MG tablet Take 1 tablet (4 mg total) by mouth daily before breakfast. 10/13/17   Argentina Donovan, PA-C  glucose blood (ACCU-CHEK AVIVA) test strip Use as instructed 04/07/17   Ladell Pier, MD  glucose blood (TRUE METRIX BLOOD GLUCOSE TEST) test strip Use as instructed 02/14/17   Ladell Pier, MD  ibuprofen (ADVIL,MOTRIN) 800 MG tablet Take 1 tablet (800 mg total) by mouth every 8 (eight) hours as needed. 04/09/17   Lawyer, Harrell Gave, PA-C  lisinopril (PRINIVIL,ZESTRIL) 40 MG tablet Take 1 tablet (40 mg total) by mouth daily. 02/04/17   Ladell Pier, MD  meloxicam (MOBIC) 15 MG tablet Take 1 tablet (15 mg total) by mouth daily. 09/22/17   Ladell Pier, MD  metFORMIN (GLUCOPHAGE) 1000 MG tablet Take 1 tablet (1,000 mg total)  by mouth 2 (two) times daily with a meal. 04/22/17   Ladell Pier, MD  SUMAtriptan (IMITREX) 100 MG tablet Take 1 tablet (100 mg total) by mouth as needed for migraine. May repeat in 2 hours if headache persists or recurs.  Do not refill in less than 30 days 02/09/17   Marcial Pacas, MD  TRUEPLUS LANCETS 28G MISC Use as directed 02/14/17   Ladell Pier, MD  venlafaxine [facility]) 37.5 MG tablet Take 1 tablet (37.5 mg total) by mouth 2 (two) times daily with a meal. 02/09/17   Marcial Pacas, MD  VOLTAREN 1 % GEL Apply 2 g topically 4 (four) times daily. 07/05/16    Boykin Nearing, MD    Family History Family History  Problem Relation Age of Onset  . Migraines Mother   . Breast cancer Neg Hx     Social History Social History   Tobacco Use  . Smoking status: Current Some Day Smoker  . Smokeless tobacco: Never Used  Substance Use Topics  . Alcohol use: No  . Drug use: No     Allergies   Elavil [amitriptyline hcl]   Review of Systems Review of Systems  Constitutional: Negative.   HENT: Negative.   Respiratory: Negative.   Cardiovascular: Negative.   Gastrointestinal: Negative.   Neurological: Positive for dizziness and headaches.  Psychiatric/Behavioral: Negative.      Physical Exam Triage Vital Signs ED Triage Vitals  Enc Vitals Group     BP 10/14/17 1859 (!) 149/77     Pulse Rate 10/14/17 1859 72     Resp 10/14/17 1859 18     Temp 10/14/17 1859 97.9 F (36.6 C)     Temp Source 10/14/17 1859 Oral     SpO2 10/14/17 1859 96 %     Weight --      Height --      Head Circumference --      Peak Flow --      Pain Score 10/14/17 1907 8     Pain Loc --      Pain Edu? --      Excl. in [place]? --    No data found.  Updated Vital Signs BP (!) 149/77 (BP Location: Left Arm)   Pulse 72   Temp 97.9 F (36.6 C) (Oral)   Resp 18   LMP 10/24/2016   SpO2 96%   Visual Acuity Right Eye Distance:   Left Eye Distance:   Bilateral Distance:    Right Eye Near:   Left Eye Near:    Bilateral Near:     Physical Exam  Constitutional: She appears well-developed and well-nourished.  HENT:  Head: Normocephalic.  Eyes: Pupils are equal, round, and reactive to light.  Cardiovascular: Normal rate and regular rhythm.  Pulmonary/Chest: Effort normal and breath sounds normal.  Neurological: She is alert. She displays normal reflexes. No sensory deficit.  Skin: Skin is warm and dry.     UC Treatments / Results  Labs (all labs ordered are listed, but only abnormal results are displayed) Labs Reviewed - No data to  display  EKG None  Radiology No results found.  Procedures Procedures (including critical care time)  Medications Ordered in UC Medications  ketorolac (TORADOL) injection 60 mg (has no administration in time range)  metoCLOPramide (REGLAN) injection 5 mg (has no administration in time range)  dexamethasone (DECADRON) injection 10 mg (has no administration in time range)    Initial Impression / Assessment and Plan / UC  Course  I have reviewed the triage vital signs and the nursing notes.  Pertinent labs & imaging results that were available during my care of the patient were reviewed by me and considered in my medical decision making (see chart for details).     Headache, possible migraine.  Will treat once again with migraine cocktail.  For leg pain will increase gabapentin for from 600 mg to 900 by taking 600 mg at bedtime and 300 in the morning. Final Clinical Impressions(s) / UC Diagnoses   Final diagnoses:  None   Discharge Instructions   None    ED Prescriptions    None     Controlled Substance Prescriptions [place] Controlled Substance Registry consulted? No   Wardell Honour, MD 10/14/17 [phone]

## 2017-10-15 DIAGNOSIS — G43109 Migraine with aura, not intractable, without status migrainosus: Secondary | ICD-10-CM | POA: Diagnosis not present

## 2017-10-16 DIAGNOSIS — G43109 Migraine with aura, not intractable, without status migrainosus: Secondary | ICD-10-CM | POA: Diagnosis not present

## 2017-10-17 DIAGNOSIS — G43109 Migraine with aura, not intractable, without status migrainosus: Secondary | ICD-10-CM | POA: Diagnosis not present

## 2017-10-18 DIAGNOSIS — G43109 Migraine with aura, not intractable, without status migrainosus: Secondary | ICD-10-CM | POA: Diagnosis not present

## 2017-10-19 DIAGNOSIS — G43109 Migraine with aura, not intractable, without status migrainosus: Secondary | ICD-10-CM | POA: Diagnosis not present

## 2017-10-20 DIAGNOSIS — G43109 Migraine with aura, not intractable, without status migrainosus: Secondary | ICD-10-CM | POA: Diagnosis not present

## 2017-10-21 DIAGNOSIS — G43109 Migraine with aura, not intractable, without status migrainosus: Secondary | ICD-10-CM | POA: Diagnosis not present

## 2017-10-22 DIAGNOSIS — G43109 Migraine with aura, not intractable, without status migrainosus: Secondary | ICD-10-CM | POA: Diagnosis not present

## 2017-10-23 DIAGNOSIS — G43109 Migraine with aura, not intractable, without status migrainosus: Secondary | ICD-10-CM | POA: Diagnosis not present

## 2017-10-24 DIAGNOSIS — G43109 Migraine with aura, not intractable, without status migrainosus: Secondary | ICD-10-CM | POA: Diagnosis not present

## 2017-10-25 DIAGNOSIS — G43109 Migraine with aura, not intractable, without status migrainosus: Secondary | ICD-10-CM | POA: Diagnosis not present

## 2017-10-26 DIAGNOSIS — G43109 Migraine with aura, not intractable, without status migrainosus: Secondary | ICD-10-CM | POA: Diagnosis not present

## 2017-10-26 MED FILL — ACCU-CHEK SOFTCLIX LANCETS: 25 days supply | Qty: 100 | Fill #1

## 2017-10-26 MED FILL — ACCU-CHEK AVIVA PLUS TEST S: 25 days supply | Qty: 100 | Fill #1

## 2017-10-27 DIAGNOSIS — G43109 Migraine with aura, not intractable, without status migrainosus: Secondary | ICD-10-CM | POA: Diagnosis not present

## 2017-10-28 DIAGNOSIS — R2689 Other abnormalities of gait and mobility: Secondary | ICD-10-CM | POA: Diagnosis not present

## 2017-10-28 DIAGNOSIS — G43109 Migraine with aura, not intractable, without status migrainosus: Secondary | ICD-10-CM | POA: Diagnosis not present

## 2017-10-29 DIAGNOSIS — G43109 Migraine with aura, not intractable, without status migrainosus: Secondary | ICD-10-CM | POA: Diagnosis not present

## 2017-10-30 DIAGNOSIS — G43109 Migraine with aura, not intractable, without status migrainosus: Secondary | ICD-10-CM | POA: Diagnosis not present

## 2017-10-31 DIAGNOSIS — G43109 Migraine with aura, not intractable, without status migrainosus: Secondary | ICD-10-CM | POA: Diagnosis not present

## 2017-11-01 DIAGNOSIS — G43109 Migraine with aura, not intractable, without status migrainosus: Secondary | ICD-10-CM | POA: Diagnosis not present

## 2017-11-02 DIAGNOSIS — G43109 Migraine with aura, not intractable, without status migrainosus: Secondary | ICD-10-CM | POA: Diagnosis not present

## 2017-11-03 ENCOUNTER — Encounter: Payer: Self-pay | Admitting: Pharmacist

## 2017-11-03 ENCOUNTER — Ambulatory Visit: Payer: Medicaid Other | Attending: Family Medicine | Admitting: Pharmacist

## 2017-11-03 VITALS — BP 125/74 | HR 96

## 2017-11-03 DIAGNOSIS — E118 Type 2 diabetes mellitus with unspecified complications: Secondary | ICD-10-CM | POA: Diagnosis not present

## 2017-11-03 DIAGNOSIS — I10 Essential (primary) hypertension: Secondary | ICD-10-CM | POA: Insufficient documentation

## 2017-11-03 DIAGNOSIS — G43109 Migraine with aura, not intractable, without status migrainosus: Secondary | ICD-10-CM | POA: Diagnosis not present

## 2017-11-03 DIAGNOSIS — Z7984 Long term (current) use of oral hypoglycemic drugs: Secondary | ICD-10-CM | POA: Diagnosis not present

## 2017-11-03 DIAGNOSIS — Z79899 Other long term (current) drug therapy: Secondary | ICD-10-CM | POA: Diagnosis not present

## 2017-11-03 DIAGNOSIS — E1165 Type 2 diabetes mellitus with hyperglycemia: Secondary | ICD-10-CM | POA: Insufficient documentation

## 2017-11-03 DIAGNOSIS — IMO0002 Reserved for concepts with insufficient information to code with codable children: Secondary | ICD-10-CM

## 2017-11-03 LAB — GLUCOSE, POCT (MANUAL RESULT ENTRY): POC GLUCOSE: 260 mg/dL — AB (ref 70–99)

## 2017-11-03 NOTE — Progress Notes (Signed)
    S:    PCP: Dr. Wynetta Emery  No chief complaint on file.  Patient arrives in good spirits. Presents for diabetes evaluation, education, and management at the request of Freeman Caldron. Patient was referred on 10/13/17. Glucose was 218 at that visit. BP 143/85. Glimepiride increased to 4 mg daily and amlodipine was increased to 10 mg daily.   Family/Social History:  FH: no pertinent positives Tobacco: 1-2 cigarettes/day  Alcohol: denies  Insurance coverage/medication affordability:  - [place] Medicaid  Patient denies adherence with medications.  Current diabetes medications include:  - Glimepiride 4 mg daily before breakfast. Pt reports taking 1 mg tablet. - Metformin 1000 mg BID Current hypertension medications include:  - amlodipine 10 mg daily - lisinopril 40 mg daily. Pt denies taking.  Patient denies hypoglycemic events.  Patient reported dietary habits:  - White carbs: high intake of rick and beans - Snacks on fruit throughout the day - Denies soda, sweets, or sugary drinks  Patient-reported exercise habits:  - Pt does not exercise.   Patient denies nocturia.  Patient reports neuropathy. Patient reports visual changes. Patient reports self foot exams.   O:  POCT glucose: 260. Ate ~12:00 pm.  Home fasting CBG: 87 - 185  random CBG: 182 - 384  Home blood pressures: 117/77 (110s/70s)   Lab Results  Component Value Date   HGBA1C 7.5 (A) 09/22/2017   There were no vitals filed for this visit.  Lipid Panel     Component Value Date/Time   CHOL 144 10/20/2015 1625   TRIG 231 (H) 10/20/2015 1625   HDL 44 (L) 10/20/2015 1625   CHOLHDL 3.3 10/20/2015 1625   VLDL 46 (H) 10/20/2015 1625   LDLCALC 54 10/20/2015 1625   Clinical ASCVD: No  ASCVD risk: cannot be calculated d/t no recent lipid panel   A/P: Diabetes longstanding currently uncontrolled. Patient is not able to verbalize appropriate hypoglycemia management plan. Patient is not adherent with medication.  Control is suboptimal due to medication non-adherence and dietary indiscretion. -Continued metformin 1000 mg BID -Started glimepiride 4 mg daily as patient was still taking 1 mg tablet.  -Extensively discussed pathophysiology of DM, recommended lifestyle interventions, dietary effects on glycemic control -Counseled on s/sx of and management of hypoglycemia -Next A1C anticipated 12/2017.   ASCVD risk - primary prevention in patient with DM. Last LDL is controlled. ASCVD risk score is not >20%  - moderate intensity statin indicated. Patient is not on a statin. Will get lipid panel and address at next encounter.  Hypertension longstanding currently at goal.  BP goal <130/80 mmHg. Patient is not adherent with medication. Pt brings in medications today and denies lisinopril. Will reassess at next visit.  - Continued amlodipine 10 mg daily  Written patient instructions provided.  Total time in face to face counseling 30 minutes.   Follow up Pharmacist Clinic Visit in 3 weeks.    Patient seen with  Dixon Boos, PharmD Candidate [place] [employer] Class of 2021  Benard Halsted, PharmD, [place] [phone]

## 2017-11-03 NOTE — Patient Instructions (Signed)
Thank you for coming to see [name] today.   Blood pressure today is well-controlled. Take amlodipine 10 mg before bed.  Blood sugar is above goal. Take metformin 1000 mg two times daily. Start taking glimepiride 4 mg before breakfast or your first meal of the day.    Continue to check blood sugar 3 times a day. When you check after lunch and dinner, wait 2 hours after eating and then check.   DASH Eating Plan DASH stands for "Dietary Approaches to Stop Hypertension." The DASH eating plan is a healthy eating plan that has been shown to reduce high blood pressure (hypertension). It may also reduce your risk for type 2 diabetes, heart disease, and stroke. The DASH eating plan may also help with weight loss. What are tips for following this plan? General guidelines  Avoid eating more than 2,300 mg (milligrams) of salt (sodium) a day. If you have hypertension, you may need to reduce your sodium intake to 1,500 mg a day.  Limit alcohol intake to no more than 1 drink a day for nonpregnant women and 2 drinks a day for men. One drink equals 12 oz of beer, 5 oz of wine, or 1 oz of hard liquor.  Work with your health care provider to maintain a healthy body weight or to lose weight. Ask what an ideal weight is for you.  Get at least 30 minutes of exercise that causes your heart to beat faster (aerobic exercise) most days of the week. Activities may include walking, swimming, or biking.  Work with your health care provider or diet and nutrition specialist (dietitian) to adjust your eating plan to your individual calorie needs. Reading food labels  Check food labels for the amount of sodium per serving. Choose foods with less than 5 percent of the Daily Value of sodium. Generally, foods with less than 300 mg of sodium per serving fit into this eating plan.  To find whole grains, look for the word "whole" as the first word in the ingredient list. Shopping  Buy products labeled as "low-sodium" or "no salt  added."  Buy fresh foods. Avoid canned foods and premade or frozen meals. Cooking  Avoid adding salt when cooking. Use salt-free seasonings or herbs instead of table salt or sea salt. Check with your health care provider or pharmacist before using salt substitutes.  Do not fry foods. Cook foods using healthy methods such as baking, boiling, grilling, and broiling instead.  Cook with heart-healthy oils, such as olive, canola, soybean, or sunflower oil. Meal planning   Eat a balanced diet that includes: ? 5 or more servings of fruits and vegetables each day. At each meal, try to fill half of your plate with fruits and vegetables. ? Up to 6-8 servings of whole grains each day. ? Less than 6 oz of lean meat, poultry, or fish each day. A 3-oz serving of meat is about the same size as a deck of cards. One egg equals 1 oz. ? 2 servings of low-fat dairy each day. ? A serving of nuts, seeds, or beans 5 times each week. ? Heart-healthy fats. Healthy fats called Omega-3 fatty acids are found in foods such as flaxseeds and coldwater fish, like sardines, salmon, and mackerel.  Limit how much you eat of the following: ? Canned or prepackaged foods. ? Food that is high in trans fat, such as fried foods. ? Food that is high in saturated fat, such as fatty meat. ? Sweets, desserts, sugary drinks, and other  foods with added sugar. ? Full-fat dairy products.  Do not salt foods before eating.  Try to eat at least 2 vegetarian meals each week.  Eat more home-cooked food and less restaurant, buffet, and fast food.  When eating at a restaurant, ask that your food be prepared with less salt or no salt, if possible. What foods are recommended? The items listed may not be a complete list. Talk with your dietitian about what dietary choices are best for you. Grains Whole-grain or whole-wheat bread. Whole-grain or whole-wheat pasta. Brown rice. April Mcmahon. Bulgur. Whole-grain and low-sodium cereals.  Pita bread. Low-fat, low-sodium crackers. Whole-wheat flour tortillas. Vegetables Fresh or frozen vegetables (raw, steamed, roasted, or grilled). Low-sodium or reduced-sodium tomato and vegetable juice. Low-sodium or reduced-sodium tomato sauce and tomato paste. Low-sodium or reduced-sodium canned vegetables. Fruits All fresh, dried, or frozen fruit. Canned fruit in natural juice (without added sugar). Meat and other protein foods Skinless chicken or [place]. Ground chicken or [place]. Pork with fat trimmed off. Fish and seafood. Egg whites. Dried beans, peas, or lentils. Unsalted nuts, nut butters, and seeds. Unsalted canned beans. Lean cuts of beef with fat trimmed off. Low-sodium, lean deli meat. Dairy Low-fat (1%) or fat-free (skim) milk. Fat-free, low-fat, or reduced-fat cheeses. Nonfat, low-sodium ricotta or cottage cheese. Low-fat or nonfat yogurt. Low-fat, low-sodium cheese. Fats and oils Soft margarine without trans fats. Vegetable oil. Low-fat, reduced-fat, or light mayonnaise and salad dressings (reduced-sodium). Canola, safflower, olive, soybean, and sunflower oils. Avocado. Seasoning and other foods Herbs. Spices. Seasoning mixes without salt. Unsalted popcorn and pretzels. Fat-free sweets. What foods are not recommended? The items listed may not be a complete list. Talk with your dietitian about what dietary choices are best for you. Grains Baked goods made with fat, such as croissants, muffins, or some breads. Dry pasta or rice meal packs. Vegetables Creamed or fried vegetables. Vegetables in a cheese sauce. Regular canned vegetables (not low-sodium or reduced-sodium). Regular canned tomato sauce and paste (not low-sodium or reduced-sodium). Regular tomato and vegetable juice (not low-sodium or reduced-sodium). April Mcmahon. Olives. Fruits Canned fruit in a light or heavy syrup. Fried fruit. Fruit in cream or butter sauce. Meat and other protein foods Fatty cuts of meat. Ribs. Fried  meat. April Mcmahon. Sausage. Bologna and other processed lunch meats. Salami. Fatback. Hotdogs. Bratwurst. Salted nuts and seeds. Canned beans with added salt. Canned or smoked fish. Whole eggs or egg yolks. Chicken or [place] with skin. Dairy Whole or 2% milk, cream, and half-and-half. Whole or full-fat cream cheese. Whole-fat or sweetened yogurt. Full-fat cheese. Nondairy creamers. Whipped toppings. Processed cheese and cheese spreads. Fats and oils Butter. Stick margarine. Lard. Shortening. Ghee. Bacon fat. Tropical oils, such as coconut, palm kernel, or palm oil. Seasoning and other foods Salted popcorn and pretzels. Onion salt, garlic salt, seasoned salt, table salt, and sea salt. Worcestershire sauce. Tartar sauce. Barbecue sauce. Teriyaki sauce. Soy sauce, including reduced-sodium. Steak sauce. Canned and packaged gravies. Fish sauce. Oyster sauce. Cocktail sauce. Horseradish that you find on the shelf. Ketchup. Mustard. Meat flavorings and tenderizers. Bouillon cubes. Hot sauce and Tabasco sauce. Premade or packaged marinades. Premade or packaged taco seasonings. Relishes. Regular salad dressings. Where to find more information:  National Heart, Lung, and [place]: https://wilson-eaton.com/  American Heart Association: www.heart.org Summary  The DASH eating plan is a healthy eating plan that has been shown to reduce high blood pressure (hypertension). It may also reduce your risk for type 2 diabetes, heart disease, and stroke.  With  the DASH eating plan, you should limit salt (sodium) intake to 2,300 mg a day. If you have hypertension, you may need to reduce your sodium intake to 1,500 mg a day.  When on the DASH eating plan, aim to eat more fresh fruits and vegetables, whole grains, lean proteins, low-fat dairy, and heart-healthy fats.  Work with your health care provider or diet and nutrition specialist (dietitian) to adjust your eating plan to your individual calorie needs. This information is  not intended to replace advice given to you by your health care provider. Make sure you discuss any questions you have with your health care provider. Document Released: 01/07/2011 Document Revised: 01/12/2016 Document Reviewed: 01/12/2016 Elsevier Interactive Patient Education  [employer].

## 2017-11-04 DIAGNOSIS — G43109 Migraine with aura, not intractable, without status migrainosus: Secondary | ICD-10-CM | POA: Diagnosis not present

## 2017-11-05 DIAGNOSIS — G43109 Migraine with aura, not intractable, without status migrainosus: Secondary | ICD-10-CM | POA: Diagnosis not present

## 2017-11-06 DIAGNOSIS — G43109 Migraine with aura, not intractable, without status migrainosus: Secondary | ICD-10-CM | POA: Diagnosis not present

## 2017-11-07 DIAGNOSIS — G43109 Migraine with aura, not intractable, without status migrainosus: Secondary | ICD-10-CM | POA: Diagnosis not present

## 2017-11-08 DIAGNOSIS — G43109 Migraine with aura, not intractable, without status migrainosus: Secondary | ICD-10-CM | POA: Diagnosis not present

## 2017-11-09 DIAGNOSIS — G43109 Migraine with aura, not intractable, without status migrainosus: Secondary | ICD-10-CM | POA: Diagnosis not present

## 2017-11-10 DIAGNOSIS — G43109 Migraine with aura, not intractable, without status migrainosus: Secondary | ICD-10-CM | POA: Diagnosis not present

## 2017-11-11 DIAGNOSIS — G43109 Migraine with aura, not intractable, without status migrainosus: Secondary | ICD-10-CM | POA: Diagnosis not present

## 2017-11-12 DIAGNOSIS — G43109 Migraine with aura, not intractable, without status migrainosus: Secondary | ICD-10-CM | POA: Diagnosis not present

## 2017-11-13 DIAGNOSIS — G43109 Migraine with aura, not intractable, without status migrainosus: Secondary | ICD-10-CM | POA: Diagnosis not present

## 2017-11-14 DIAGNOSIS — G43109 Migraine with aura, not intractable, without status migrainosus: Secondary | ICD-10-CM | POA: Diagnosis not present

## 2017-11-15 DIAGNOSIS — G43109 Migraine with aura, not intractable, without status migrainosus: Secondary | ICD-10-CM | POA: Diagnosis not present

## 2017-11-16 ENCOUNTER — Other Ambulatory Visit: Payer: Self-pay

## 2017-11-16 ENCOUNTER — Ambulatory Visit: Payer: Self-pay | Admitting: Family Medicine

## 2017-11-16 DIAGNOSIS — G43109 Migraine with aura, not intractable, without status migrainosus: Secondary | ICD-10-CM | POA: Diagnosis not present

## 2017-11-17 DIAGNOSIS — G43109 Migraine with aura, not intractable, without status migrainosus: Secondary | ICD-10-CM | POA: Diagnosis not present

## 2017-11-18 ENCOUNTER — Ambulatory Visit: Payer: Medicaid Other | Admitting: Podiatry

## 2017-11-18 DIAGNOSIS — G43109 Migraine with aura, not intractable, without status migrainosus: Secondary | ICD-10-CM | POA: Diagnosis not present

## 2017-11-18 DIAGNOSIS — M722 Plantar fascial fibromatosis: Secondary | ICD-10-CM | POA: Diagnosis not present

## 2017-11-18 DIAGNOSIS — M79671 Pain in right foot: Secondary | ICD-10-CM

## 2017-11-18 MED ORDER — MELOXICAM 15 MG PO TABS: 15.0000 mg | ORAL_TABLET | Freq: Every day | ORAL | 2 refills | Status: DC

## 2017-11-18 NOTE — Patient Instructions (Signed)

## 2017-11-19 DIAGNOSIS — G43109 Migraine with aura, not intractable, without status migrainosus: Secondary | ICD-10-CM | POA: Diagnosis not present

## 2017-11-20 DIAGNOSIS — G43109 Migraine with aura, not intractable, without status migrainosus: Secondary | ICD-10-CM | POA: Diagnosis not present

## 2017-11-21 DIAGNOSIS — G43109 Migraine with aura, not intractable, without status migrainosus: Secondary | ICD-10-CM | POA: Diagnosis not present

## 2017-11-22 DIAGNOSIS — G43109 Migraine with aura, not intractable, without status migrainosus: Secondary | ICD-10-CM | POA: Diagnosis not present

## 2017-11-23 DIAGNOSIS — G43109 Migraine with aura, not intractable, without status migrainosus: Secondary | ICD-10-CM | POA: Diagnosis not present

## 2017-11-24 ENCOUNTER — Encounter: Payer: Self-pay | Admitting: Pharmacist

## 2017-11-24 ENCOUNTER — Ambulatory Visit: Payer: Medicaid Other | Attending: Internal Medicine | Admitting: Pharmacist

## 2017-11-24 DIAGNOSIS — Z720 Tobacco use: Secondary | ICD-10-CM | POA: Diagnosis not present

## 2017-11-24 DIAGNOSIS — E119 Type 2 diabetes mellitus without complications: Secondary | ICD-10-CM

## 2017-11-24 DIAGNOSIS — G43109 Migraine with aura, not intractable, without status migrainosus: Secondary | ICD-10-CM | POA: Diagnosis not present

## 2017-11-24 DIAGNOSIS — E118 Type 2 diabetes mellitus with unspecified complications: Secondary | ICD-10-CM

## 2017-11-24 DIAGNOSIS — E1165 Type 2 diabetes mellitus with hyperglycemia: Secondary | ICD-10-CM

## 2017-11-24 DIAGNOSIS — IMO0002 Reserved for concepts with insufficient information to code with codable children: Secondary | ICD-10-CM

## 2017-11-24 LAB — GLUCOSE, POCT (MANUAL RESULT ENTRY): POC GLUCOSE: 242 mg/dL — AB (ref 70–99)

## 2017-11-24 MED ORDER — METFORMIN HCL 1000 MG PO TABS: 1000.0000 mg | ORAL_TABLET | Freq: Two times a day (BID) | ORAL | 2 refills | Status: DC

## 2017-11-24 MED ORDER — GLIMEPIRIDE 4 MG PO TABS: 4.0000 mg | ORAL_TABLET | Freq: Every day | ORAL | 3 refills | Status: DC

## 2017-11-24 MED ORDER — EMPAGLIFLOZIN 10 MG PO TABS: 10.0000 mg | ORAL_TABLET | Freq: Every day | ORAL | 2 refills | Status: DC

## 2017-11-24 NOTE — Patient Instructions (Signed)
Thank you for coming to see me today. Please do the following:  1. Continue metformin 1 tablet 2 times a day. 2. Continue glimepiride 4 mg daily.  3. Start Jardiance 10 mg daily.  4. Continue checking blood sugars at home. It's really important that you record these and bring these in to your next doctor's appointment. If you get in readings above 500 or lower than 70, call me or the clinic to let your doctor know. See below on how to treat low blood sugar.  5. Continue making the lifestyle changes we've discussed together during our visit. Diet and exercise play a significant role in improving your blood sugars.  6. Follow-up with PCP.   Hypoglycemia or low blood sugar:   Low blood sugar can happen quickly and may become an emergency if not treated right away.   While this shouldn't happen often, it can be brought upon if you skip a meal or do not eat enough. Also, if your insulin or other diabetes medications are dosed too high, this can cause your blood sugar to go to low.   Warning signs of low blood sugar include: 1. Feeling shaky or dizzy 2. Feeling weak or tired  3. Excessive hunger 4. Feeling anxious or upset  5. Sweating even when you aren't exercising  What to do if I experience low blood sugar? 1. Check your blood sugar with your meter. If lower than 70, proceed to step 2.  2. Treat with 3-4 glucose tablets or 3 packets of regular sugar. If these aren't around, you can try hard candy. Yet another option would be to drink 4 ounces of fruit juice or 6 ounces of REGULAR soda.  3. Re-check your sugar in 15 minutes. If it is still below 70, do what you did in step 2 again. If has come back up, go ahead and eat a snack or small meal at this time.

## 2017-11-24 NOTE — Progress Notes (Signed)
    S:    PCP: Dr. Wynetta Emery  No chief complaint on file.  Patient arrives in good spirits. Presents for diabetes management at the request of April Mcmahon. Patient was referred on 10/13/17. Patient was last seen by her PCP 09/22/17.    I last saw her 11/03/17. She was very confused with her medication. She presented with the 1 mg and 4 mg strengths of glimepiride. Additionally, she had multiple bottles of metformin. At the conclusion of our visit, pt reported understanding to take 4 mg of glimepiride daily with metformin 1000 mg BID.   Family/Social History:  FH: no pertinent positives Tobacco: 1-2 cigarettes/day  Alcohol: denies  Insurance coverage/medication affordability:  - [place] Medicaid  Patient denies adherence with medications.  Current diabetes medications include:  - Glimepiride 4 mg daily before breakfast.  - Metformin 1000 mg BID. Has been out for ~3 days. Current hypertension medications include:  - amlodipine 10 mg daily - lisinopril 40 mg daily. Still not taking  Patient reports hypoglycemic events. No objective hypoglycemia. Reports symptoms of relative hypoglycemia.   Patient reported dietary habits:  - Changes: decreased white carbs and is trying to incorporate more vegetables  Patient-reported exercise habits:  - Pt does not exercise.   Patient reports polyuria, polydipsia.  Patient denies neuropathy. Patient reports visual changes. Patient reports self foot exams.   O:  POCT glucose: 242  Home fasting CBG: 128 - 275 (310) 1 hour Post-prandial: 100 - 400   Lab Results  Component Value Date   HGBA1C 7.5 (A) 09/22/2017   There were no vitals filed for this visit.  Lipid Panel     Component Value Date/Time   CHOL 144 10/20/2015 1625   TRIG 231 (H) 10/20/2015 1625   HDL 44 (L) 10/20/2015 1625   CHOLHDL 3.3 10/20/2015 1625   VLDL 46 (H) 10/20/2015 1625   LDLCALC 54 10/20/2015 1625   Clinical ASCVD: No   A/P: Diabetes longstanding currently  uncontrolled. Patient is able to verbalize appropriate hypoglycemia management plan. Patient is not adherent with medication. Control is suboptimal due to medication non-adherence and dietary indiscretion.  -Continued metformin 1000 mg BID -Continued glimepiride 4 mg daily -Start Jardiance 10 mg daily  -Extensively discussed pathophysiology of DM, recommended lifestyle interventions, dietary effects on glycemic control -Counseled on s/sx of and management of hypoglycemia -Next A1C anticipated 12/2017.   ASCVD risk - primary prevention in patient with DM. Pt sees her PCP next month. She had LFT abnormality in 09/2017. Will not have her start statin until liver function is evaluated by PCP.   Hypertension longstanding currently at goal.  BP goal <130/80 mmHg. At her previous visit with me, pt did not report taking lisinopril. Microalb:SCr ratio >30 noted in 06/2017. Last renal function/electrolyte status WNL. Will hold and defer reinitiating lisinopril to PCP.   Written patient instructions provided.  Total time in face to face counseling 30 minutes.   Follow up w/ PCP 12/23/2017.  Patient seen with  Dixon Boos, PharmD Candidate [place] [employer] Class of 2021  Benard Halsted, PharmD, [place] [phone]

## 2017-11-25 DIAGNOSIS — G43109 Migraine with aura, not intractable, without status migrainosus: Secondary | ICD-10-CM | POA: Diagnosis not present

## 2017-11-26 DIAGNOSIS — G43109 Migraine with aura, not intractable, without status migrainosus: Secondary | ICD-10-CM | POA: Diagnosis not present

## 2017-11-27 DIAGNOSIS — G43109 Migraine with aura, not intractable, without status migrainosus: Secondary | ICD-10-CM | POA: Diagnosis not present

## 2017-11-28 DIAGNOSIS — G43109 Migraine with aura, not intractable, without status migrainosus: Secondary | ICD-10-CM | POA: Diagnosis not present

## 2017-11-29 DIAGNOSIS — G43109 Migraine with aura, not intractable, without status migrainosus: Secondary | ICD-10-CM | POA: Diagnosis not present

## 2017-11-30 DIAGNOSIS — G43109 Migraine with aura, not intractable, without status migrainosus: Secondary | ICD-10-CM | POA: Diagnosis not present

## 2017-12-01 DIAGNOSIS — G43109 Migraine with aura, not intractable, without status migrainosus: Secondary | ICD-10-CM | POA: Diagnosis not present

## 2017-12-01 NOTE — Progress Notes (Signed)
Subjective:  Patient ID: April Mcmahon, female    DOB: 12/17/1960,  MRN: [id number]  Chief Complaint  Patient presents with  . Plantar Fasciitis    6 week follow up    57 y.o. female presents with the above complaint.  The injection did not do much and she does not want to have another  Review of Systems: Negative except as noted in the HPI. Denies N/V/F/Ch.  Past Medical History:  Diagnosis Date  . Bilateral chronic knee pain 06/2014  . Chronic low back pain 06/2014  . Chronic migraine 02/1985  . Diabetes mellitus without complication ([place]) 21/1941  . Hypertension 09/2013    Current Outpatient Medications:  .  ACCU-CHEK SOFTCLIX LANCETS lancets, Use as instructed, Disp: 100 each, Rfl: 12 .  amLODipine (NORVASC) 10 MG tablet, Take 1 tablet (10 mg total) by mouth daily., Disp: 90 tablet, Rfl: 3 .  aspirin 325 MG tablet, Take 650 mg by mouth every 6 (six) hours as needed. For headache, Disp: , Rfl:  .  Blood Glucose Monitoring Suppl (ACCU-CHEK AVIVA) device, Use as instructed, Disp: 1 each, Rfl: 0 .  divalproex (DEPAKOTE ER) 250 MG 24 hr tablet, Take 1 tablet (250 mg total) by mouth at bedtime., Disp: 30 tablet, Rfl: 11 .  empagliflozin (JARDIANCE) 10 MG TABS tablet, Take 10 mg by mouth daily., Disp: 30 tablet, Rfl: 2 .  fluticasone (FLONASE) 50 MCG/ACT nasal spray, Place 1 spray into both nostrils daily., Disp: 16 g, Rfl: 2 .  gabapentin (NEURONTIN) 300 MG capsule, Take 1 capsule (300 mg total) by mouth 2 (two) times daily., Disp: 60 capsule, Rfl: 5 .  glimepiride (AMARYL) 4 MG tablet, Take 1 tablet (4 mg total) by mouth daily before breakfast., Disp: 30 tablet, Rfl: 3 .  glucose blood (ACCU-CHEK AVIVA) test strip, Use as instructed, Disp: 100 each, Rfl: 12 .  ibuprofen (ADVIL,MOTRIN) 800 MG tablet, Take 1 tablet (800 mg total) by mouth every 8 (eight) hours as needed., Disp: 21 tablet, Rfl: 0 .  lisinopril (PRINIVIL,ZESTRIL) 40 MG tablet, Take 1 tablet (40 mg total) by mouth daily.  (Patient not taking: Reported on 11/03/2017), Disp: 30 tablet, Rfl: 4 .  meloxicam (MOBIC) 15 MG tablet, Take 1 tablet (15 mg total) by mouth daily., Disp: 30 tablet, Rfl: 2 .  metFORMIN (GLUCOPHAGE) 1000 MG tablet, Take 1 tablet (1,000 mg total) by mouth 2 (two) times daily with a meal., Disp: 60 tablet, Rfl: 2 .  SUMAtriptan (IMITREX) 100 MG tablet, Take 1 tablet (100 mg total) by mouth as needed for migraine. May repeat in 2 hours if headache persists or recurs.  Do not refill in less than 30 days, Disp: 10 tablet, Rfl: 6 .  TRUEPLUS LANCETS 28G MISC, Use as directed, Disp: 100 each, Rfl: 1 .  venlafaxine (EFFEXOR) 37.5 MG tablet, Take 1 tablet (37.5 mg total) by mouth 2 (two) times daily with a meal., Disp: 60 tablet, Rfl: 11 .  VOLTAREN 1 % GEL, Apply 2 g topically 4 (four) times daily., Disp: 100 g, Rfl: 0  Social History   Tobacco Use  Smoking Status Current Some Day Smoker  Smokeless Tobacco Never Used    Allergies  Allergen Reactions  . Elavil [Amitriptyline Hcl]     GI upset   Objective:  There were no vitals filed for this visit. There is no height or weight on file to calculate BMI. Constitutional Well developed. Well nourished.  Vascular Dorsalis pedis pulses palpable bilaterally. Posterior tibial pulses palpable  bilaterally. Capillary refill normal to all digits.  No cyanosis or clubbing noted. Pedal hair growth normal.  Neurologic Normal speech. Oriented to person, place, and time. Epicritic sensation to light touch grossly present bilaterally.  Dermatologic Nails well groomed and normal in appearance. No open wounds. No skin lesions.  Orthopedic: Normal joint ROM without pain or crepitus bilaterally. No visible deformities. Tender to palpation at the calcaneal tuber right. No pain with calcaneal squeeze right. Ankle ROM diminished range of motion right. Silfverskiold Test: positive right.   Assessment:   1. Plantar fasciitis   2. Pain of right heel     Plan:  Patient was evaluated and treated and all questions answered.  Plantar Fasciitis bilateral -Offered repeat injection patient declined.  Advised to continue stretching and icing and to return if she wishes to discuss possible injection   Return if symptoms worsen or fail to improve.

## 2017-12-02 DIAGNOSIS — G43109 Migraine with aura, not intractable, without status migrainosus: Secondary | ICD-10-CM | POA: Diagnosis not present

## 2017-12-03 DIAGNOSIS — G43109 Migraine with aura, not intractable, without status migrainosus: Secondary | ICD-10-CM | POA: Diagnosis not present

## 2017-12-04 DIAGNOSIS — G43109 Migraine with aura, not intractable, without status migrainosus: Secondary | ICD-10-CM | POA: Diagnosis not present

## 2017-12-05 DIAGNOSIS — G43109 Migraine with aura, not intractable, without status migrainosus: Secondary | ICD-10-CM | POA: Diagnosis not present

## 2017-12-06 DIAGNOSIS — G43109 Migraine with aura, not intractable, without status migrainosus: Secondary | ICD-10-CM | POA: Diagnosis not present

## 2017-12-06 MED FILL — ACCU-CHEK AVIVA PLUS TEST S: 25 days supply | Qty: 100 | Fill #2

## 2017-12-06 MED FILL — ACCU-CHEK SOFTCLIX LANCETS: 25 days supply | Qty: 100 | Fill #2

## 2017-12-07 DIAGNOSIS — G43109 Migraine with aura, not intractable, without status migrainosus: Secondary | ICD-10-CM | POA: Diagnosis not present

## 2017-12-08 DIAGNOSIS — G43109 Migraine with aura, not intractable, without status migrainosus: Secondary | ICD-10-CM | POA: Diagnosis not present

## 2017-12-09 DIAGNOSIS — G43109 Migraine with aura, not intractable, without status migrainosus: Secondary | ICD-10-CM | POA: Diagnosis not present

## 2017-12-10 DIAGNOSIS — G43109 Migraine with aura, not intractable, without status migrainosus: Secondary | ICD-10-CM | POA: Diagnosis not present

## 2017-12-11 DIAGNOSIS — G43109 Migraine with aura, not intractable, without status migrainosus: Secondary | ICD-10-CM | POA: Diagnosis not present

## 2017-12-12 DIAGNOSIS — G43109 Migraine with aura, not intractable, without status migrainosus: Secondary | ICD-10-CM | POA: Diagnosis not present

## 2017-12-13 DIAGNOSIS — G43109 Migraine with aura, not intractable, without status migrainosus: Secondary | ICD-10-CM | POA: Diagnosis not present

## 2017-12-14 DIAGNOSIS — G43109 Migraine with aura, not intractable, without status migrainosus: Secondary | ICD-10-CM | POA: Diagnosis not present

## 2017-12-15 DIAGNOSIS — G43109 Migraine with aura, not intractable, without status migrainosus: Secondary | ICD-10-CM | POA: Diagnosis not present

## 2017-12-16 DIAGNOSIS — G43109 Migraine with aura, not intractable, without status migrainosus: Secondary | ICD-10-CM | POA: Diagnosis not present

## 2017-12-17 DIAGNOSIS — G43109 Migraine with aura, not intractable, without status migrainosus: Secondary | ICD-10-CM | POA: Diagnosis not present

## 2017-12-18 DIAGNOSIS — G43109 Migraine with aura, not intractable, without status migrainosus: Secondary | ICD-10-CM | POA: Diagnosis not present

## 2017-12-19 DIAGNOSIS — G43109 Migraine with aura, not intractable, without status migrainosus: Secondary | ICD-10-CM | POA: Diagnosis not present

## 2017-12-20 DIAGNOSIS — G43109 Migraine with aura, not intractable, without status migrainosus: Secondary | ICD-10-CM | POA: Diagnosis not present

## 2017-12-21 DIAGNOSIS — G43109 Migraine with aura, not intractable, without status migrainosus: Secondary | ICD-10-CM | POA: Diagnosis not present

## 2017-12-22 DIAGNOSIS — G43109 Migraine with aura, not intractable, without status migrainosus: Secondary | ICD-10-CM | POA: Diagnosis not present

## 2017-12-23 ENCOUNTER — Encounter: Payer: Self-pay | Admitting: Internal Medicine

## 2017-12-23 ENCOUNTER — Ambulatory Visit: Payer: Medicaid Other | Attending: Internal Medicine | Admitting: Internal Medicine

## 2017-12-23 VITALS — BP 117/75 | HR 73 | Temp 98.5°F | Resp 16 | Wt 149.0 lb

## 2017-12-23 DIAGNOSIS — G43109 Migraine with aura, not intractable, without status migrainosus: Secondary | ICD-10-CM | POA: Diagnosis not present

## 2017-12-23 DIAGNOSIS — Z7982 Long term (current) use of aspirin: Secondary | ICD-10-CM | POA: Diagnosis not present

## 2017-12-23 DIAGNOSIS — F17200 Nicotine dependence, unspecified, uncomplicated: Secondary | ICD-10-CM

## 2017-12-23 DIAGNOSIS — G43909 Migraine, unspecified, not intractable, without status migrainosus: Secondary | ICD-10-CM | POA: Insufficient documentation

## 2017-12-23 DIAGNOSIS — I10 Essential (primary) hypertension: Secondary | ICD-10-CM | POA: Insufficient documentation

## 2017-12-23 DIAGNOSIS — Z791 Long term (current) use of non-steroidal anti-inflammatories (NSAID): Secondary | ICD-10-CM | POA: Diagnosis not present

## 2017-12-23 DIAGNOSIS — Z888 Allergy status to other drugs, medicaments and biological substances status: Secondary | ICD-10-CM | POA: Diagnosis not present

## 2017-12-23 DIAGNOSIS — E1165 Type 2 diabetes mellitus with hyperglycemia: Secondary | ICD-10-CM | POA: Diagnosis not present

## 2017-12-23 DIAGNOSIS — E785 Hyperlipidemia, unspecified: Secondary | ICD-10-CM | POA: Diagnosis not present

## 2017-12-23 DIAGNOSIS — Z7984 Long term (current) use of oral hypoglycemic drugs: Secondary | ICD-10-CM | POA: Diagnosis not present

## 2017-12-23 DIAGNOSIS — E559 Vitamin D deficiency, unspecified: Secondary | ICD-10-CM | POA: Insufficient documentation

## 2017-12-23 DIAGNOSIS — M79671 Pain in right foot: Secondary | ICD-10-CM

## 2017-12-23 DIAGNOSIS — E1142 Type 2 diabetes mellitus with diabetic polyneuropathy: Secondary | ICD-10-CM | POA: Insufficient documentation

## 2017-12-23 DIAGNOSIS — Z79899 Other long term (current) drug therapy: Secondary | ICD-10-CM | POA: Insufficient documentation

## 2017-12-23 DIAGNOSIS — F17210 Nicotine dependence, cigarettes, uncomplicated: Secondary | ICD-10-CM | POA: Diagnosis not present

## 2017-12-23 DIAGNOSIS — E119 Type 2 diabetes mellitus without complications: Secondary | ICD-10-CM | POA: Diagnosis present

## 2017-12-23 DIAGNOSIS — G3184 Mild cognitive impairment, so stated: Secondary | ICD-10-CM | POA: Diagnosis not present

## 2017-12-23 DIAGNOSIS — M170 Bilateral primary osteoarthritis of knee: Secondary | ICD-10-CM | POA: Insufficient documentation

## 2017-12-23 DIAGNOSIS — K760 Fatty (change of) liver, not elsewhere classified: Secondary | ICD-10-CM | POA: Diagnosis not present

## 2017-12-23 LAB — POCT GLYCOSYLATED HEMOGLOBIN (HGB A1C): HBA1C, POC (CONTROLLED DIABETIC RANGE): 7.1 % — AB (ref 0.0–7.0)

## 2017-12-23 LAB — GLUCOSE, POCT (MANUAL RESULT ENTRY): POC Glucose: 198 mg/dl — AB (ref 70–99)

## 2017-12-23 MED ORDER — GABAPENTIN 300 MG PO CAPS: 300.0000 mg | ORAL_CAPSULE | Freq: Two times a day (BID) | ORAL | 5 refills | Status: DC

## 2017-12-23 MED ORDER — NICOTINE POLACRILEX 2 MG MT GUM: 2.0000 mg | CHEWING_GUM | OROMUCOSAL | 1 refills | Status: DC | PRN

## 2017-12-23 NOTE — Progress Notes (Signed)
Patient ID: April Mcmahon, female    DOB: 1960/02/14  MRN: [id number]  CC: Diabetes; Hypertension; and Headache   Subjective: April Mcmahon is a 57 y.o. female who presents for chronic disease management.  Daughter-in-law, Enis Gash, is with her and interprets.  Her concerns today include:  Pt with hx of DMneuropathy, HTN, HL, tob dep, migraines, Vit D def, DJD knees and plantar fasciitis.  RT heel pain:  X-ray was nl.  Still has a lot of pain in this heel; worse in mornings.  Saw podiatry in the past and had steroid injection to the heel but she did not find it helpful or the effects long-lasting.  Abnormal LFT: AST/ALT persistently elevated.  U/S c/w steatosis Loss 6 lbs since last visit.  Walking daily for 1/2 hr a day  Tob dep: still smoking 2 cigarettes a day.  Trying to quit but finds it difficult as she has smoked since childhood.  Quit for 1 mth about 3 yrs ago  DM:  Reports compliance with Metformin 1 gram BID and Amaryl 4 mg daily. Jardiance added on las visit with clinical pharmacist BS: She has log with her.  She is checking blood sugars 3 times a day.  Before [id number], before lunch 100-168 with several in tthe 200s, before dinner 100-160  Patient Active Problem List   Diagnosis Date Noted  . MCI (mild cognitive impairment) 02/14/2017  . Diabetic peripheral neuropathy ([place]) 02/14/2017  . Memory loss 12/27/2016  . Dysfunctional uterine bleeding 08/30/2016  . Analgesic rebound headache 06/24/2016  . Esophageal dysphagia 06/21/2016  . Tobacco dependence 01/20/2016  . Plantar fasciitis, bilateral 12/24/2015  . 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 Diabetes type 2, controlled ([place]) 06/19/2015  . Essential hypertension 06/19/2015  . Vitamin D deficiency 09/24/2013  . Hyperlipidemia 09/24/2013  . Migraine 05/15/2013  . Low back pain 08/18/2012     Current Outpatient Medications on File Prior to Visit  Medication Sig Dispense Refill  . ACCU-CHEK SOFTCLIX  LANCETS lancets Use as instructed 100 each 12  . amLODipine (NORVASC) 10 MG tablet Take 1 tablet (10 mg total) by mouth daily. 90 tablet 3  . aspirin 325 MG tablet Take 650 mg by mouth every 6 (six) hours as needed. For headache    . Blood Glucose Monitoring Suppl (ACCU-CHEK AVIVA) device Use as instructed 1 each 0  . empagliflozin (JARDIANCE) 10 MG TABS tablet Take 10 mg by mouth daily. 30 tablet 2  . fluticasone (FLONASE) 50 MCG/ACT nasal spray Place 1 spray into both nostrils daily. 16 g 2  . glimepiride (AMARYL) 4 MG tablet Take 1 tablet (4 mg total) by mouth daily before breakfast. 30 tablet 3  . glucose blood (ACCU-CHEK AVIVA) test strip Use as instructed 100 each 12  . ibuprofen (ADVIL,MOTRIN) 800 MG tablet Take 1 tablet (800 mg total) by mouth every 8 (eight) hours as needed. 21 tablet 0  . lisinopril (PRINIVIL,ZESTRIL) 40 MG tablet Take 1 tablet (40 mg total) by mouth daily. (Patient not taking: Reported on 11/03/2017) 30 tablet 4  . meloxicam (MOBIC) 15 MG tablet Take 1 tablet (15 mg total) by mouth daily. 30 tablet 2  . metFORMIN (GLUCOPHAGE) 1000 MG tablet Take 1 tablet (1,000 mg total) by mouth 2 (two) times daily with a meal. 60 tablet 2  . SUMAtriptan (IMITREX) 100 MG tablet Take 1 tablet (100 mg total) by mouth as needed for migraine. May repeat in 2 hours if headache persists or recurs.  Do not refill in less than 30 days 10 tablet 6  . TRUEPLUS LANCETS 28G MISC Use as directed 100 each 1  . venlafaxine (EFFEXOR) 37.5 MG tablet Take 1 tablet (37.5 mg total) by mouth 2 (two) times daily with a meal. 60 tablet 11  . VOLTAREN 1 % GEL Apply 2 g topically 4 (four) times daily. 100 g 0   No current facility-administered medications on file prior to visit.     Allergies  Allergen Reactions  . Elavil [Amitriptyline Hcl]     GI upset    Social History   Socioeconomic History  . Marital status: Married    Spouse name: Not on file  . Number of children: Not on file  . Years of  education: Not on file  . Highest education level: Not on file  Occupational History  . Not on file  Social Needs  . Financial resource strain: Not on file  . Food insecurity:    Worry: Not on file    Inability: Not on file  . Transportation needs:    Medical: Not on file    Non-medical: Not on file  Tobacco Use  . Smoking status: Current Some Day Smoker  . Smokeless tobacco: Never Used  Substance and Sexual Activity  . Alcohol use: No  . Drug use: No  . Sexual activity: Not on file  Lifestyle  . Physical activity:    Days per week: Not on file    Minutes per session: Not on file  . Stress: Not on file  Relationships  . Social connections:    Talks on phone: Not on file    Gets together: Not on file    Attends religious service: Not on file    Active member of club or organization: Not on file    Attends meetings of clubs or organizations: Not on file    Relationship status: Not on file  . Intimate partner violence:    Fear of current or ex partner: Not on file    Emotionally abused: Not on file    Physically abused: Not on file    Forced sexual activity: Not on file  Other Topics Concern  . Not on file  Social History Narrative  . Not on file    Family History  Problem Relation Age of Onset  . Migraines Mother   . Breast cancer Neg Hx     Past Surgical History:  Procedure Laterality Date  . no previous surgeries      ROS: Review of Systems Negative except as above  PHYSICAL EXAM: BP 117/75   Pulse 73   Temp 98.5 F (36.9 C) (Oral)   Resp 16   Wt 149 lb (67.6 kg)   LMP 10/24/2016   SpO2 97%   BMI 30.09 kg/m   Wt Readings from Last 3 Encounters:  12/23/17 149 lb (67.6 kg)  10/13/17 154 lb (69.9 kg)  09/22/17 156 lb (70.8 kg)    Physical Exam General appearance - alert, well appearing, and in no distress Mental status - normal mood, behavior, speech, dress, motor activity, and thought processes Neck - supple, no significant adenopathy Chest  - clear to auscultation, no wheezes, rales or rhonchi, symmetric air entry Heart - normal rate, regular rhythm, normal S1, S2, no murmurs, rubs, clicks or gallops Abdomen: Normal bowel sounds, soft, no hepatosplenomegaly Extremities - peripheral pulses normal, no pedal edema, no clubbing or cyanosis Right foot: No edema or erythema.  Mild tenderness on  palpation of the plantar heel  Results for orders placed or performed in visit on 12/23/17  POCT glucose (manual entry)  Result Value Ref Range   POC Glucose 198 (A) 70 - 99 mg/dl  POCT glycosylated hemoglobin (Hb A1C)  Result Value Ref Range   Hemoglobin A1C     HbA1c POC (<> result, manual entry)     HbA1c, POC (prediabetic range)     HbA1c, POC (controlled diabetic range) 7.1 (A) 0.0 - 7.0 %    ASSESSMENT AND PLAN:  1. Controlled type 2 diabetes mellitus with diabetic polyneuropathy, without long-term current use of insulin ([place]) Commended her on improvement in blood sugars and A1c.  She will continue metformin, Amaryl and Jardiance.  Encouraged her to continue healthy eating habits and regular exercise.  Commended her on weight loss so far. - POCT glucose (manual entry) - POCT glycosylated hemoglobin (Hb A1C) - gabapentin (NEURONTIN) 300 MG capsule; Take 1 capsule (300 mg total) by mouth 2 (two) times daily.  Dispense: 60 capsule; Refill: 5  2. Hepatic steatosis Commended her on weight loss.  Encouraged her to continue healthy eating habits and regular exercise. - Hepatic Function Panel  3. Tobacco dependence Advised to quit.  She is willing to try the nicotine patches to try to decrease cravings - nicotine polacrilex (NICORETTE) 2 MG gum; Take 1 each (2 mg total) by mouth as needed for smoking cessation.  Dispense: 100 tablet; Refill: 1  4. Pain of right heel Consistent with plantar fasciitis.  Patient declines referral back to podiatry.  I went over heel exercises and encouraged her to do them at home.  Also advised purchasing  some Dr. Felicie Morn gel heel inserts for her shoes  5. Essential hypertension Continue lisinopril.     Patient was given the opportunity to ask questions.  Patient verbalized understanding of the plan and was able to repeat key elements of the plan.   Orders Placed This Encounter  Procedures  . Hepatic Function Panel  . POCT glucose (manual entry)  . POCT glycosylated hemoglobin (Hb A1C)     Requested Prescriptions   Signed Prescriptions Disp Refills  . nicotine polacrilex (NICORETTE) 2 MG gum 100 tablet 1    Sig: Take 1 each (2 mg total) by mouth as needed for smoking cessation.  . gabapentin (NEURONTIN) 300 MG capsule 60 capsule 5    Sig: Take 1 capsule (300 mg total) by mouth 2 (two) times daily.    Return in about 4 months (around 04/23/2018).  Karle Plumber, MD, FACP

## 2017-12-23 NOTE — Patient Instructions (Addendum)
Purchase a pair of Doctor Scholl's gel heel inserts for your shoes.

## 2017-12-24 DIAGNOSIS — G43109 Migraine with aura, not intractable, without status migrainosus: Secondary | ICD-10-CM | POA: Diagnosis not present

## 2017-12-24 LAB — HEPATIC FUNCTION PANEL
ALBUMIN: 4.7 g/dL (ref 3.5–5.5)
ALK PHOS: 137 IU/L — AB (ref 39–117)
ALT: 73 IU/L — AB (ref 0–32)
AST: 55 IU/L — AB (ref 0–40)
BILIRUBIN TOTAL: 0.3 mg/dL (ref 0.0–1.2)
BILIRUBIN, DIRECT: 0.08 mg/dL (ref 0.00–0.40)
Total Protein: 7.2 g/dL (ref 6.0–8.5)

## 2017-12-25 DIAGNOSIS — G43109 Migraine with aura, not intractable, without status migrainosus: Secondary | ICD-10-CM | POA: Diagnosis not present

## 2017-12-26 ENCOUNTER — Ambulatory Visit: Payer: Medicaid Other | Attending: Internal Medicine | Admitting: Pharmacist

## 2017-12-26 ENCOUNTER — Encounter: Payer: Self-pay | Admitting: Pharmacist

## 2017-12-26 VITALS — BP 111/66 | HR 79

## 2017-12-26 DIAGNOSIS — Z79899 Other long term (current) drug therapy: Secondary | ICD-10-CM | POA: Diagnosis not present

## 2017-12-26 DIAGNOSIS — E1142 Type 2 diabetes mellitus with diabetic polyneuropathy: Secondary | ICD-10-CM

## 2017-12-26 DIAGNOSIS — E119 Type 2 diabetes mellitus without complications: Secondary | ICD-10-CM | POA: Diagnosis not present

## 2017-12-26 DIAGNOSIS — Z23 Encounter for immunization: Secondary | ICD-10-CM | POA: Diagnosis not present

## 2017-12-26 DIAGNOSIS — I10 Essential (primary) hypertension: Secondary | ICD-10-CM | POA: Diagnosis not present

## 2017-12-26 DIAGNOSIS — Z7984 Long term (current) use of oral hypoglycemic drugs: Secondary | ICD-10-CM | POA: Diagnosis not present

## 2017-12-26 DIAGNOSIS — G43109 Migraine with aura, not intractable, without status migrainosus: Secondary | ICD-10-CM | POA: Diagnosis not present

## 2017-12-26 MED ORDER — TETANUS-DIPHTH-ACELL PERTUSSIS 5-2.5-18.5 LF-MCG/0.5 IM SUSP
0.5000 mL | Freq: Once | INTRAMUSCULAR | Status: AC
  Administered 2017-12-26: 0.5 mL via INTRAMUSCULAR

## 2017-12-26 NOTE — Progress Notes (Signed)
    S:    PCP: Dr. Wynetta Emery  No chief complaint on file.  Patient arrives in good spirits. Presents for diabetes management at the request of Freeman Caldron. Patient was referred on 10/13/17. Patient was last seen by her PCP 12/23/17.   Family/Social History:  FH: no pertinent positives Tobacco: 1-2 cigarettes/day  Alcohol: denies  Insurance coverage/medication affordability:  - [place] Medicaid  Patient reports adherence with medications.  Current diabetes medications include:  - Glimepiride 4 mg daily before breakfast.  - Metformin 1000 mg BID.  - Jardiance 10 mg daily Current hypertension medications include:  - amlodipine 10 mg daily - lisinopril 40 mg daily.   Patient denies hypoglycemic events.  Patient reported dietary habits:  - Pr reports difficulty with limiting white carbs - Has increased intake of vegetables  Patient-reported exercise habits:  - Walks 30 mins/day   Patient reports polydipsia but denies polyphagia and polyuria. Patient reports neuropathy (baseline).  Patient denies visual changes. Patient reports self foot exams.   O:  POCT glucose: 202. Ate noodles around 9:30. Did not bring her log with her  Lab Results  Component Value Date   HGBA1C 7.1 (A) 12/23/2017   There were no vitals filed for this visit.  Lipid Panel     Component Value Date/Time   CHOL 144 10/20/2015 1625   TRIG 231 (H) 10/20/2015 1625   HDL 44 (L) 10/20/2015 1625   CHOLHDL 3.3 10/20/2015 1625   VLDL 46 (H) 10/20/2015 1625   LDLCALC 54 10/20/2015 1625   Clinical ASCVD: No   A/P: Diabetes longstanding currently close to goal with A1c of 7. Patient is able to verbalize appropriate hypoglycemia management plan. Patient adherent with medication.   -Continued metformin, glimepiride, and Jardiance -Encouraged compliance with gabapentin (rx by PCP 11/22 for neuropathy) -Extensively discussed pathophysiology of DM, recommended lifestyle interventions, dietary effects on  glycemic control -Counseled on s/sx of and management of hypoglycemia -HM: Influenza, Tetanus vaccines given  ASCVD risk - primary prevention in patient with DM. Pt with persistent LFT abnormality. Will defer statin therapy to PCP. - lipid  Hypertension longstanding currently at goal.  BP goal <130/80 mmHg. - Continued lisinopril and amlodipine  Written patient instructions provided.  Total time in face to face counseling 30 minutes.     Patient seen with  Dixon Boos, PharmD Candidate [employer] Class [employer] 2021  Benard Halsted, PharmD, [place] [phone]

## 2017-12-26 NOTE — Patient Instructions (Signed)
Thank you for coming to see me today. Please do the following:  1. Continue medications for diabetes. 2. Continue medications for high blood pressure. 3. Continue checking blood sugars at home. 4. Continue making the lifestyle changes we've discussed together during our visit. Diet and exercise play a significant role in improving your blood sugars.  5. Follow-up with PCP 04/24/2018.

## 2017-12-27 DIAGNOSIS — G43109 Migraine with aura, not intractable, without status migrainosus: Secondary | ICD-10-CM | POA: Diagnosis not present

## 2017-12-27 LAB — LIPID PANEL
CHOLESTEROL TOTAL: 198 mg/dL (ref 100–199)
Chol/HDL Ratio: 4.2 ratio (ref 0.0–4.4)
HDL: 47 mg/dL (ref >39)
LDL CALC: 104 mg/dL — AB (ref 0–99)
TRIGLYCERIDES: 235 mg/dL — AB (ref 0–149)
VLDL CHOLESTEROL CAL: 47 mg/dL — AB (ref 5–40)

## 2017-12-28 ENCOUNTER — Telehealth: Payer: Self-pay

## 2017-12-28 DIAGNOSIS — G43109 Migraine with aura, not intractable, without status migrainosus: Secondary | ICD-10-CM | POA: Diagnosis not present

## 2017-12-28 NOTE — Telephone Encounter (Signed)
Contacted pt daughter in law to go over lab results pt didn't answer left a vm asking pt daughter in law to give me a call at her earliest convenience   If pt/daughter-in-law calls back please give results:  cholesterol is elevated. LDL cholesterol is 104 with goal being less than 70. Normally I would start a cholesterol-lowering medication, but because her liver enzymes are slightly elevated, we will hold off on doing so. liver enzymes are slightly decreased from 3 mths ago. Encouraged her to continue walking daily for exercise continue healthy eating habits both of which will help to lower the cholesterol.

## 2017-12-29 DIAGNOSIS — G43109 Migraine with aura, not intractable, without status migrainosus: Secondary | ICD-10-CM | POA: Diagnosis not present

## 2017-12-30 DIAGNOSIS — G43109 Migraine with aura, not intractable, without status migrainosus: Secondary | ICD-10-CM | POA: Diagnosis not present

## 2017-12-31 DIAGNOSIS — G43109 Migraine with aura, not intractable, without status migrainosus: Secondary | ICD-10-CM | POA: Diagnosis not present

## 2018-01-01 DIAGNOSIS — G43109 Migraine with aura, not intractable, without status migrainosus: Secondary | ICD-10-CM | POA: Diagnosis not present

## 2018-01-02 DIAGNOSIS — G43109 Migraine with aura, not intractable, without status migrainosus: Secondary | ICD-10-CM | POA: Diagnosis not present

## 2018-01-03 DIAGNOSIS — G43109 Migraine with aura, not intractable, without status migrainosus: Secondary | ICD-10-CM | POA: Diagnosis not present

## 2018-01-04 DIAGNOSIS — G43109 Migraine with aura, not intractable, without status migrainosus: Secondary | ICD-10-CM | POA: Diagnosis not present

## 2018-01-05 DIAGNOSIS — G43109 Migraine with aura, not intractable, without status migrainosus: Secondary | ICD-10-CM | POA: Diagnosis not present

## 2018-01-06 DIAGNOSIS — G43109 Migraine with aura, not intractable, without status migrainosus: Secondary | ICD-10-CM | POA: Diagnosis not present

## 2018-01-07 DIAGNOSIS — G43109 Migraine with aura, not intractable, without status migrainosus: Secondary | ICD-10-CM | POA: Diagnosis not present

## 2018-01-08 DIAGNOSIS — G43109 Migraine with aura, not intractable, without status migrainosus: Secondary | ICD-10-CM | POA: Diagnosis not present

## 2018-01-09 DIAGNOSIS — G43109 Migraine with aura, not intractable, without status migrainosus: Secondary | ICD-10-CM | POA: Diagnosis not present

## 2018-01-10 DIAGNOSIS — G43109 Migraine with aura, not intractable, without status migrainosus: Secondary | ICD-10-CM | POA: Diagnosis not present

## 2018-01-11 DIAGNOSIS — G43109 Migraine with aura, not intractable, without status migrainosus: Secondary | ICD-10-CM | POA: Diagnosis not present

## 2018-01-12 DIAGNOSIS — G43109 Migraine with aura, not intractable, without status migrainosus: Secondary | ICD-10-CM | POA: Diagnosis not present

## 2018-01-13 DIAGNOSIS — G43109 Migraine with aura, not intractable, without status migrainosus: Secondary | ICD-10-CM | POA: Diagnosis not present

## 2018-01-14 DIAGNOSIS — G43109 Migraine with aura, not intractable, without status migrainosus: Secondary | ICD-10-CM | POA: Diagnosis not present

## 2018-01-15 DIAGNOSIS — G43109 Migraine with aura, not intractable, without status migrainosus: Secondary | ICD-10-CM | POA: Diagnosis not present

## 2018-01-16 ENCOUNTER — Other Ambulatory Visit: Payer: Self-pay | Admitting: Internal Medicine

## 2018-01-16 DIAGNOSIS — E119 Type 2 diabetes mellitus without complications: Secondary | ICD-10-CM

## 2018-01-16 DIAGNOSIS — G43109 Migraine with aura, not intractable, without status migrainosus: Secondary | ICD-10-CM | POA: Diagnosis not present

## 2018-01-17 DIAGNOSIS — G43109 Migraine with aura, not intractable, without status migrainosus: Secondary | ICD-10-CM | POA: Diagnosis not present

## 2018-01-18 DIAGNOSIS — G43109 Migraine with aura, not intractable, without status migrainosus: Secondary | ICD-10-CM | POA: Diagnosis not present

## 2018-01-19 DIAGNOSIS — G43109 Migraine with aura, not intractable, without status migrainosus: Secondary | ICD-10-CM | POA: Diagnosis not present

## 2018-01-20 DIAGNOSIS — G43109 Migraine with aura, not intractable, without status migrainosus: Secondary | ICD-10-CM | POA: Diagnosis not present

## 2018-01-21 DIAGNOSIS — G43109 Migraine with aura, not intractable, without status migrainosus: Secondary | ICD-10-CM | POA: Diagnosis not present

## 2018-01-22 DIAGNOSIS — G43109 Migraine with aura, not intractable, without status migrainosus: Secondary | ICD-10-CM | POA: Diagnosis not present

## 2018-01-23 DIAGNOSIS — G43109 Migraine with aura, not intractable, without status migrainosus: Secondary | ICD-10-CM | POA: Diagnosis not present

## 2018-01-24 DIAGNOSIS — G43109 Migraine with aura, not intractable, without status migrainosus: Secondary | ICD-10-CM | POA: Diagnosis not present

## 2018-01-25 DIAGNOSIS — G43109 Migraine with aura, not intractable, without status migrainosus: Secondary | ICD-10-CM | POA: Diagnosis not present

## 2018-01-26 DIAGNOSIS — G43109 Migraine with aura, not intractable, without status migrainosus: Secondary | ICD-10-CM | POA: Diagnosis not present

## 2018-01-27 DIAGNOSIS — G43109 Migraine with aura, not intractable, without status migrainosus: Secondary | ICD-10-CM | POA: Diagnosis not present

## 2018-01-27 MED FILL — ACCU-CHEK AVIVA PLUS TEST S: 25 days supply | Qty: 100 | Fill #3

## 2018-01-27 MED FILL — ACCU-CHEK SOFTCLIX LANCETS: 25 days supply | Qty: 100 | Fill #3

## 2018-01-28 DIAGNOSIS — G43109 Migraine with aura, not intractable, without status migrainosus: Secondary | ICD-10-CM | POA: Diagnosis not present

## 2018-01-29 DIAGNOSIS — G43109 Migraine with aura, not intractable, without status migrainosus: Secondary | ICD-10-CM | POA: Diagnosis not present

## 2018-01-30 DIAGNOSIS — G43109 Migraine with aura, not intractable, without status migrainosus: Secondary | ICD-10-CM | POA: Diagnosis not present

## 2018-01-31 DIAGNOSIS — G43109 Migraine with aura, not intractable, without status migrainosus: Secondary | ICD-10-CM | POA: Diagnosis not present

## 2018-02-01 DIAGNOSIS — G43109 Migraine with aura, not intractable, without status migrainosus: Secondary | ICD-10-CM | POA: Diagnosis not present

## 2018-02-02 DIAGNOSIS — G43109 Migraine with aura, not intractable, without status migrainosus: Secondary | ICD-10-CM | POA: Diagnosis not present

## 2018-02-03 DIAGNOSIS — G43109 Migraine with aura, not intractable, without status migrainosus: Secondary | ICD-10-CM | POA: Diagnosis not present

## 2018-02-04 DIAGNOSIS — G43109 Migraine with aura, not intractable, without status migrainosus: Secondary | ICD-10-CM | POA: Diagnosis not present

## 2018-02-05 DIAGNOSIS — G43109 Migraine with aura, not intractable, without status migrainosus: Secondary | ICD-10-CM | POA: Diagnosis not present

## 2018-02-06 DIAGNOSIS — G43109 Migraine with aura, not intractable, without status migrainosus: Secondary | ICD-10-CM | POA: Diagnosis not present

## 2018-02-07 DIAGNOSIS — G43109 Migraine with aura, not intractable, without status migrainosus: Secondary | ICD-10-CM | POA: Diagnosis not present

## 2018-02-08 ENCOUNTER — Other Ambulatory Visit: Payer: Self-pay | Admitting: Internal Medicine

## 2018-02-08 DIAGNOSIS — E1165 Type 2 diabetes mellitus with hyperglycemia: Secondary | ICD-10-CM

## 2018-02-08 DIAGNOSIS — G43109 Migraine with aura, not intractable, without status migrainosus: Secondary | ICD-10-CM | POA: Diagnosis not present

## 2018-02-09 DIAGNOSIS — G43109 Migraine with aura, not intractable, without status migrainosus: Secondary | ICD-10-CM | POA: Diagnosis not present

## 2018-02-10 DIAGNOSIS — G43109 Migraine with aura, not intractable, without status migrainosus: Secondary | ICD-10-CM | POA: Diagnosis not present

## 2018-02-11 DIAGNOSIS — G43109 Migraine with aura, not intractable, without status migrainosus: Secondary | ICD-10-CM | POA: Diagnosis not present

## 2018-02-12 DIAGNOSIS — G43109 Migraine with aura, not intractable, without status migrainosus: Secondary | ICD-10-CM | POA: Diagnosis not present

## 2018-02-13 DIAGNOSIS — G43109 Migraine with aura, not intractable, without status migrainosus: Secondary | ICD-10-CM | POA: Diagnosis not present

## 2018-02-14 DIAGNOSIS — G43109 Migraine with aura, not intractable, without status migrainosus: Secondary | ICD-10-CM | POA: Diagnosis not present

## 2018-02-15 DIAGNOSIS — G43109 Migraine with aura, not intractable, without status migrainosus: Secondary | ICD-10-CM | POA: Diagnosis not present

## 2018-02-16 DIAGNOSIS — G43109 Migraine with aura, not intractable, without status migrainosus: Secondary | ICD-10-CM | POA: Diagnosis not present

## 2018-02-17 DIAGNOSIS — G43109 Migraine with aura, not intractable, without status migrainosus: Secondary | ICD-10-CM | POA: Diagnosis not present

## 2018-02-18 DIAGNOSIS — G43109 Migraine with aura, not intractable, without status migrainosus: Secondary | ICD-10-CM | POA: Diagnosis not present

## 2018-02-19 DIAGNOSIS — G43109 Migraine with aura, not intractable, without status migrainosus: Secondary | ICD-10-CM | POA: Diagnosis not present

## 2018-02-20 DIAGNOSIS — G43109 Migraine with aura, not intractable, without status migrainosus: Secondary | ICD-10-CM | POA: Diagnosis not present

## 2018-02-21 ENCOUNTER — Encounter: Payer: Self-pay | Admitting: Internal Medicine

## 2018-02-21 ENCOUNTER — Ambulatory Visit: Payer: Medicaid Other | Attending: Internal Medicine | Admitting: Internal Medicine

## 2018-02-21 VITALS — BP 116/66 | HR 67 | Temp 97.5°F | Resp 16 | Ht 60.0 in | Wt 146.6 lb

## 2018-02-21 DIAGNOSIS — F17200 Nicotine dependence, unspecified, uncomplicated: Secondary | ICD-10-CM | POA: Insufficient documentation

## 2018-02-21 DIAGNOSIS — K760 Fatty (change of) liver, not elsewhere classified: Secondary | ICD-10-CM

## 2018-02-21 DIAGNOSIS — M170 Bilateral primary osteoarthritis of knee: Secondary | ICD-10-CM | POA: Diagnosis not present

## 2018-02-21 DIAGNOSIS — I10 Essential (primary) hypertension: Secondary | ICD-10-CM | POA: Insufficient documentation

## 2018-02-21 DIAGNOSIS — Z8669 Personal history of other diseases of the nervous system and sense organs: Secondary | ICD-10-CM

## 2018-02-21 DIAGNOSIS — R252 Cramp and spasm: Secondary | ICD-10-CM | POA: Diagnosis not present

## 2018-02-21 DIAGNOSIS — E11649 Type 2 diabetes mellitus with hypoglycemia without coma: Secondary | ICD-10-CM

## 2018-02-21 DIAGNOSIS — Z888 Allergy status to other drugs, medicaments and biological substances status: Secondary | ICD-10-CM | POA: Diagnosis not present

## 2018-02-21 DIAGNOSIS — Z7984 Long term (current) use of oral hypoglycemic drugs: Secondary | ICD-10-CM | POA: Diagnosis not present

## 2018-02-21 DIAGNOSIS — E1142 Type 2 diabetes mellitus with diabetic polyneuropathy: Secondary | ICD-10-CM

## 2018-02-21 DIAGNOSIS — Z791 Long term (current) use of non-steroidal anti-inflammatories (NSAID): Secondary | ICD-10-CM | POA: Insufficient documentation

## 2018-02-21 DIAGNOSIS — Z79899 Other long term (current) drug therapy: Secondary | ICD-10-CM | POA: Insufficient documentation

## 2018-02-21 DIAGNOSIS — G43909 Migraine, unspecified, not intractable, without status migrainosus: Secondary | ICD-10-CM | POA: Insufficient documentation

## 2018-02-21 DIAGNOSIS — E7800 Pure hypercholesterolemia, unspecified: Secondary | ICD-10-CM | POA: Diagnosis not present

## 2018-02-21 DIAGNOSIS — Z7982 Long term (current) use of aspirin: Secondary | ICD-10-CM | POA: Diagnosis not present

## 2018-02-21 DIAGNOSIS — G43109 Migraine with aura, not intractable, without status migrainosus: Secondary | ICD-10-CM | POA: Diagnosis not present

## 2018-02-21 DIAGNOSIS — F17210 Nicotine dependence, cigarettes, uncomplicated: Secondary | ICD-10-CM

## 2018-02-21 LAB — GLUCOSE, POCT (MANUAL RESULT ENTRY): POC Glucose: 209 mg/dl — AB (ref 70–99)

## 2018-02-21 MED ORDER — ATORVASTATIN CALCIUM 10 MG PO TABS: 10.0000 mg | ORAL_TABLET | Freq: Every day | ORAL | 1 refills | Status: DC

## 2018-02-21 MED ORDER — GLIMEPIRIDE 2 MG PO TABS: 2.0000 mg | ORAL_TABLET | Freq: Every day | ORAL | 3 refills | Status: DC

## 2018-02-21 MED ORDER — SUMATRIPTAN SUCCINATE 100 MG PO TABS: 100.0000 mg | ORAL_TABLET | ORAL | 6 refills | Status: DC | PRN

## 2018-02-21 NOTE — Progress Notes (Signed)
Patient ID: April Mcmahon, female    DOB: 04-Aug-1960  MRN: [id number]  CC: Diabetes and Hypertension   Subjective: April Mcmahon is a 58 y.o. female who presents for urgent care visit Her concerns today include:  Pt with hx of DMneuropathy, HTN, HL, tob dep, migraines, Vit D def, DJD knees and plantar fasciitis, Abn LFT with steatosis on [name]. Daughter-in-law, Enis Gash, is with her and interprets.   Migraine: Reports flare of migraine and she is out of Imitrex.  Requests that refill be sent to her pharmacy.  Tob Dep:  Still 2 cig a day.  Prescribed nicotine gum on last visit but it was sent to a pharmacy that she no longer uses.  She is requesting that it be sent to her new pharmacy.  HL: I went over results of labs that were done on last visit.  Her AST and ALT were still elevated but decreased compared to 5 months previous.  She has known steatosis on ultrasound.  She has been trying to get her weight down.  Weight is down 8 pounds since September of last year.  LDL cholesterol was at 104 with goal being less than 70.    Some cramps in legs x 1 yr, when sitting.  No cramps with walking.  DM: She is concerned about hypoglycemic episodes.  For the past 10 days, morning blood sugars have been between 50-60, lunch 120-140 and bedtime 80-90.  Blood sugar this morning was reportedly in the low 100.  She can tell when blood sugar is low because she feels shaky.  Reportedly getting in her 3 meals a day.. Patient Active Problem List   Diagnosis Date Noted  . Hepatic steatosis 12/23/2017  . MCI (mild cognitive impairment) 02/14/2017  . Diabetic peripheral neuropathy ([place]) 02/14/2017  . Memory loss 12/27/2016  . Dysfunctional uterine bleeding 08/30/2016  . Analgesic rebound headache 06/24/2016  . Esophageal dysphagia 06/21/2016  . Tobacco dependence 01/20/2016  . Plantar fasciitis, bilateral 12/24/2015  . 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 Diabetes type 2, controlled ([place])  06/19/2015  . Essential hypertension 06/19/2015  . Vitamin D deficiency 09/24/2013  . Hyperlipidemia 09/24/2013  . Migraine 05/15/2013  . Low back pain 08/18/2012     Current Outpatient Medications on File Prior to Visit  Medication Sig Dispense Refill  . ACCU-CHEK SOFTCLIX LANCETS lancets Use as instructed 100 each 12  . amLODipine (NORVASC) 10 MG tablet Take 1 tablet (10 mg total) by mouth daily. 90 tablet 3  . aspirin 325 MG tablet Take 650 mg by mouth every 6 (six) hours as needed. For headache    . Blood Glucose Monitoring Suppl (ACCU-CHEK AVIVA) device Use as instructed 1 each 0  . fluticasone (FLONASE) 50 MCG/ACT nasal spray Place 1 spray into both nostrils daily. 16 g 2  . gabapentin (NEURONTIN) 300 MG capsule Take 1 capsule (300 mg total) by mouth 2 (two) times daily. 60 capsule 5  . glimepiride (AMARYL) 4 MG tablet TAKE ONE TABLET BY MOUTH DAILY BEFORE BREAKFAST 30 tablet 2  . glucose blood (ACCU-CHEK AVIVA) test strip Use as instructed 100 each 12  . ibuprofen (ADVIL,MOTRIN) 800 MG tablet Take 1 tablet (800 mg total) by mouth every 8 (eight) hours as needed. 21 tablet 0  . JARDIANCE 10 MG TABS tablet TAKE ONE TABLET BY MOUTH DAILY 30 tablet 2  . lisinopril (PRINIVIL,ZESTRIL) 40 MG tablet Take 1 tablet (40 mg total) by mouth daily. (Patient not taking: Reported on  11/03/2017) 30 tablet 4  . meloxicam (MOBIC) 15 MG tablet Take 1 tablet (15 mg total) by mouth daily. 30 tablet 2  . metFORMIN (GLUCOPHAGE) 1000 MG tablet TAKE ONE TABLET BY MOUTH TWICE DAILY WITH FOOD 60 tablet 2  . nicotine polacrilex (NICORETTE) 2 MG gum Take 1 each (2 mg total) by mouth as needed for smoking cessation. 100 tablet 1  . SUMAtriptan (IMITREX) 100 MG tablet Take 1 tablet (100 mg total) by mouth as needed for migraine. May repeat in 2 hours if headache persists or recurs.  Do not refill in less than 30 days 10 tablet 6  . TRUEPLUS LANCETS 28G MISC Use as directed 100 each 1  . venlafaxine (EFFEXOR) 37.5  MG tablet Take 1 tablet (37.5 mg total) by mouth 2 (two) times daily with a meal. 60 tablet 11  . VOLTAREN 1 % GEL Apply 2 g topically 4 (four) times daily. 100 g 0   No current facility-administered medications on file prior to visit.     Allergies  Allergen Reactions  . Elavil [Amitriptyline Hcl] Diarrhea and Nausea And Vomiting    Social History   Socioeconomic History  . Marital status: Married    Spouse name: Not on file  . Number of children: Not on file  . Years of education: Not on file  . Highest education level: Not on file  Occupational History  . Not on file  Social Needs  . Financial resource strain: Not on file  . Food insecurity:    Worry: Not on file    Inability: Not on file  . Transportation needs:    Medical: Not on file    Non-medical: Not on file  Tobacco Use  . Smoking status: Current Some Day Smoker  . Smokeless tobacco: Never Used  Substance and Sexual Activity  . Alcohol use: No  . Drug use: No  . Sexual activity: Not on file  Lifestyle  . Physical activity:    Days per week: Not on file    Minutes per session: Not on file  . Stress: Not on file  Relationships  . Social connections:    Talks on phone: Not on file    Gets together: Not on file    Attends religious service: Not on file    Active member of club or organization: Not on file    Attends meetings of clubs or organizations: Not on file    Relationship status: Not on file  . Intimate partner violence:    Fear of current or ex partner: Not on file    Emotionally abused: Not on file    Physically abused: Not on file    Forced sexual activity: Not on file  Other Topics Concern  . Not on file  Social History Narrative  . Not on file    Family History  Problem Relation Age of Onset  . Migraines Mother   . Breast cancer Neg Hx     Past Surgical History:  Procedure Laterality Date  . no previous surgeries      ROS: Review of Systems Negative except as above. PHYSICAL  EXAM: BP 116/66   Pulse 67   Temp (!) 97.5 F (36.4 C) (Oral)   Resp 16   Ht 5' (1.524 m)   Wt 146 lb 9.6 oz (66.5 kg)   LMP 10/24/2016   SpO2 100%   BMI 28.63 kg/m   Wt Readings from Last 3 Encounters:  02/21/18 146 lb 9.6 oz (  66.5 kg)  12/23/17 149 lb (67.6 kg)  10/13/17 154 lb (69.9 kg)    Physical Exam  General appearance - alert, well appearing, and in no distress Mental status - normal mood, behavior, speech, dress, motor activity, and thought processes Neck - supple, no significant adenopathy Chest - clear to auscultation, no wheezes, rales or rhonchi, symmetric air entry Heart - normal rate, regular rhythm, normal S1, S2, no murmurs, rubs, clicks or gallops Extremities - peripheral pulses normal, no pedal edema, no clubbing or cyanosis Popliteal, dorsalis pedis and posterior tibialis pulses are 3+ bilaterally Results for orders placed or performed in visit on 02/21/18  POCT glucose (manual entry)  Result Value Ref Range   POC Glucose 209 (A) 70 - 99 mg/dl   Depression screen [facility] 2/9 02/21/2018 09/22/2017 06/21/2017  Decreased Interest 1 1 1   Down, Depressed, Hopeless 0 2 0  PHQ - 2 Score 1 3 1   Altered sleeping - 2 -  Tired, decreased energy - 3 -  Change in appetite - 0 -  Feeling bad or failure about yourself  - 1 -  Trouble concentrating - 2 -  Moving slowly or fidgety/restless - 1 -  Suicidal thoughts - 0 -  PHQ-9 Score - 12 -  Some recent data might be hidden     ASSESSMENT AND PLAN:  1. Hypoglycemia associated with type 2 diabetes mellitus ([place]) Advised to decrease Amaryl from 4 mg daily to 2 mg daily.  She did have her diabetes medicines with her so I was able to point this 1 out to her.  The pill is scored.  I have sent a new prescription to her pharmacy for the 2 mg tablets. - POCT glucose (manual entry)  2. Tobacco dependence Commended her on trying to quit.  I have resent the prescription for the nicotine gum to the pharmacy of her choice.  3.  Hepatic steatosis Continue to stressed the importance of healthy eating habits and regular exercise to achieve weight loss.  4. Pure hypercholesterolemia We will try her on low-dose of Lipitor to help lower LDL cholesterol.  However I have explained to her that we will need to keep an eye on her liver function tests and as long as they do not significantly increase or stays stable we can continue the cholesterol medication.  Her daughter-in-law will bring her back to the laboratory for repeat chemistry after she has been on the Lipitor for 2 weeks. - atorvastatin (LIPITOR) 10 MG tablet; Take 1 tablet (10 mg total) by mouth daily.  Dispense: 30 tablet; Refill: 1 - Comprehensive metabolic panel; Future  5. Muscle cramps We will check chemistry when she returns to the lab in 2 weeks.  For natural remedies I recommend drinking a half a cup of tonic water about twice a week  6. History of migraine - SUMAtriptan (IMITREX) 100 MG tablet; Take 1 tablet (100 mg total) by mouth as needed for migraine. May repeat in 2 hours if headache persists or recurs.  Do not refill in less than 30 days  Dispense: 10 tablet; Refill: 6  Patient was given the opportunity to ask questions.  Patient verbalized understanding of the plan and was able to repeat key elements of the plan.   Orders Placed This Encounter  Procedures  . POCT glucose (manual entry)     Requested Prescriptions    No prescriptions requested or ordered in this encounter    No follow-ups on file.  Karle Plumber, MD, FACP

## 2018-02-21 NOTE — Patient Instructions (Signed)
Start atorvastatin 10 mg daily to help lower the cholesterol.  If you find that you have increased cramps in your legs with this medication please stop the medication and let me know.  Due to your blood sugars being low I recommend decreasing glimepiride to half a tablet a day which would be equal to 2 mg daily.  If blood sugars continue to be low in the mornings then discontinued the glimepiride altogether.  Blood sugar goals is 80-130 before meals and less than 180 2 hours after meals.  Please remember to bring her back to the lab in 2 weeks for repeat liver function test.

## 2018-02-22 DIAGNOSIS — G43109 Migraine with aura, not intractable, without status migrainosus: Secondary | ICD-10-CM | POA: Diagnosis not present

## 2018-02-23 DIAGNOSIS — G43109 Migraine with aura, not intractable, without status migrainosus: Secondary | ICD-10-CM | POA: Diagnosis not present

## 2018-02-24 ENCOUNTER — Telehealth: Payer: Self-pay | Admitting: Internal Medicine

## 2018-02-24 DIAGNOSIS — G43109 Migraine with aura, not intractable, without status migrainosus: Secondary | ICD-10-CM | POA: Diagnosis not present

## 2018-02-24 DIAGNOSIS — E1142 Type 2 diabetes mellitus with diabetic polyneuropathy: Secondary | ICD-10-CM

## 2018-02-24 MED ORDER — VOLTAREN 1 % TD GEL: 2.0000 g | Freq: Four times a day (QID) | TRANSDERMAL | 2 refills | Status: DC

## 2018-02-24 MED ORDER — GLUCOSE BLOOD VI STRP: ORAL_STRIP | 12 refills | Status: DC

## 2018-02-24 MED ORDER — ACCU-CHEK SOFTCLIX LANCETS MISC: 12 refills | Status: DC

## 2018-02-24 NOTE — Telephone Encounter (Signed)
1) Medication(s) Requested (by name): TRUEPLUS LANCETS, ACCU-CHEK SOFTCLIX LANCETS, VENLAFAXINE, VOLTRAREN 1% GEL  2) Pharmacy of Choice: [facility]  3) Special Requests: [phone] pt phone  Approved medications will be sent to the pharmacy, we will reach out if there is an issue.  Requests made after 3pm may not be addressed until the following business day!  If a patient is unsure of the name of the medication(s) please note and ask patient to call back when they are able to provide all info, do not send to responsible party until all information is available!

## 2018-02-25 DIAGNOSIS — G43109 Migraine with aura, not intractable, without status migrainosus: Secondary | ICD-10-CM | POA: Diagnosis not present

## 2018-02-26 DIAGNOSIS — G43109 Migraine with aura, not intractable, without status migrainosus: Secondary | ICD-10-CM | POA: Diagnosis not present

## 2018-02-27 DIAGNOSIS — G43109 Migraine with aura, not intractable, without status migrainosus: Secondary | ICD-10-CM | POA: Diagnosis not present

## 2018-02-28 DIAGNOSIS — G43109 Migraine with aura, not intractable, without status migrainosus: Secondary | ICD-10-CM | POA: Diagnosis not present

## 2018-03-01 DIAGNOSIS — G43109 Migraine with aura, not intractable, without status migrainosus: Secondary | ICD-10-CM | POA: Diagnosis not present

## 2018-03-02 DIAGNOSIS — G43109 Migraine with aura, not intractable, without status migrainosus: Secondary | ICD-10-CM | POA: Diagnosis not present

## 2018-03-03 DIAGNOSIS — G43109 Migraine with aura, not intractable, without status migrainosus: Secondary | ICD-10-CM | POA: Diagnosis not present

## 2018-03-04 DIAGNOSIS — G43109 Migraine with aura, not intractable, without status migrainosus: Secondary | ICD-10-CM | POA: Diagnosis not present

## 2018-03-05 DIAGNOSIS — G43109 Migraine with aura, not intractable, without status migrainosus: Secondary | ICD-10-CM | POA: Diagnosis not present

## 2018-03-06 DIAGNOSIS — G43109 Migraine with aura, not intractable, without status migrainosus: Secondary | ICD-10-CM | POA: Diagnosis not present

## 2018-03-07 ENCOUNTER — Other Ambulatory Visit: Payer: Self-pay | Admitting: Internal Medicine

## 2018-03-07 ENCOUNTER — Ambulatory Visit: Payer: Medicaid Other | Attending: Family Medicine

## 2018-03-07 DIAGNOSIS — E7800 Pure hypercholesterolemia, unspecified: Secondary | ICD-10-CM

## 2018-03-07 DIAGNOSIS — G43109 Migraine with aura, not intractable, without status migrainosus: Secondary | ICD-10-CM | POA: Diagnosis not present

## 2018-03-08 DIAGNOSIS — G43109 Migraine with aura, not intractable, without status migrainosus: Secondary | ICD-10-CM | POA: Diagnosis not present

## 2018-03-08 LAB — COMPREHENSIVE METABOLIC PANEL
A/G RATIO: 1.7 (ref 1.2–2.2)
ALT: 36 IU/L — ABNORMAL HIGH (ref 0–32)
AST: 28 IU/L (ref 0–40)
Albumin: 4.8 g/dL (ref 3.8–4.9)
Alkaline Phosphatase: 139 IU/L — ABNORMAL HIGH (ref 39–117)
BUN / CREAT RATIO: 15 (ref 9–23)
BUN: 10 mg/dL (ref 6–24)
Bilirubin Total: 0.5 mg/dL (ref 0.0–1.2)
CO2: 22 mmol/L (ref 20–29)
Calcium: 9.6 mg/dL (ref 8.7–10.2)
Chloride: 103 mmol/L (ref 96–106)
Creatinine, Ser: 0.67 mg/dL (ref 0.57–1.00)
GFR calc Af Amer: 114 mL/min/{1.73_m2} (ref >59)
GFR calc non Af Amer: 99 mL/min/{1.73_m2} (ref >59)
GLOBULIN, TOTAL: 2.8 g/dL (ref 1.5–4.5)
Glucose: 94 mg/dL (ref 65–99)
POTASSIUM: 4.7 mmol/L (ref 3.5–5.2)
SODIUM: 140 mmol/L (ref 134–144)
Total Protein: 7.6 g/dL (ref 6.0–8.5)

## 2018-03-09 ENCOUNTER — Telehealth: Payer: Self-pay

## 2018-03-09 DIAGNOSIS — G43109 Migraine with aura, not intractable, without status migrainosus: Secondary | ICD-10-CM | POA: Diagnosis not present

## 2018-03-09 NOTE — Telephone Encounter (Signed)
Contacted pt daughter to go over lab results pt daughter is aware and doesn't have any questions or concerns 

## 2018-03-10 DIAGNOSIS — G43109 Migraine with aura, not intractable, without status migrainosus: Secondary | ICD-10-CM | POA: Diagnosis not present

## 2018-03-11 DIAGNOSIS — G43109 Migraine with aura, not intractable, without status migrainosus: Secondary | ICD-10-CM | POA: Diagnosis not present

## 2018-03-12 DIAGNOSIS — G43109 Migraine with aura, not intractable, without status migrainosus: Secondary | ICD-10-CM | POA: Diagnosis not present

## 2018-03-13 DIAGNOSIS — G43109 Migraine with aura, not intractable, without status migrainosus: Secondary | ICD-10-CM | POA: Diagnosis not present

## 2018-03-14 DIAGNOSIS — G43109 Migraine with aura, not intractable, without status migrainosus: Secondary | ICD-10-CM | POA: Diagnosis not present

## 2018-03-15 ENCOUNTER — Other Ambulatory Visit: Payer: Self-pay | Admitting: Internal Medicine

## 2018-03-15 DIAGNOSIS — E7800 Pure hypercholesterolemia, unspecified: Secondary | ICD-10-CM

## 2018-03-15 DIAGNOSIS — G43109 Migraine with aura, not intractable, without status migrainosus: Secondary | ICD-10-CM | POA: Diagnosis not present

## 2018-03-16 DIAGNOSIS — G43109 Migraine with aura, not intractable, without status migrainosus: Secondary | ICD-10-CM | POA: Diagnosis not present

## 2018-03-17 DIAGNOSIS — G43109 Migraine with aura, not intractable, without status migrainosus: Secondary | ICD-10-CM | POA: Diagnosis not present

## 2018-03-18 DIAGNOSIS — G43109 Migraine with aura, not intractable, without status migrainosus: Secondary | ICD-10-CM | POA: Diagnosis not present

## 2018-03-19 DIAGNOSIS — G43109 Migraine with aura, not intractable, without status migrainosus: Secondary | ICD-10-CM | POA: Diagnosis not present

## 2018-03-20 DIAGNOSIS — G43109 Migraine with aura, not intractable, without status migrainosus: Secondary | ICD-10-CM | POA: Diagnosis not present

## 2018-03-20 MED FILL — ACCU-CHEK AVIVA PLUS TEST S: 25 days supply | Qty: 100 | Fill #4

## 2018-03-20 MED FILL — ACCU-CHEK SOFTCLIX LANCETS: 25 days supply | Qty: 100 | Fill #4

## 2018-03-21 DIAGNOSIS — G43109 Migraine with aura, not intractable, without status migrainosus: Secondary | ICD-10-CM | POA: Diagnosis not present

## 2018-03-22 DIAGNOSIS — G43109 Migraine with aura, not intractable, without status migrainosus: Secondary | ICD-10-CM | POA: Diagnosis not present

## 2018-03-23 DIAGNOSIS — G43109 Migraine with aura, not intractable, without status migrainosus: Secondary | ICD-10-CM | POA: Diagnosis not present

## 2018-03-24 DIAGNOSIS — G43109 Migraine with aura, not intractable, without status migrainosus: Secondary | ICD-10-CM | POA: Diagnosis not present

## 2018-03-25 DIAGNOSIS — G43109 Migraine with aura, not intractable, without status migrainosus: Secondary | ICD-10-CM | POA: Diagnosis not present

## 2018-03-26 DIAGNOSIS — G43109 Migraine with aura, not intractable, without status migrainosus: Secondary | ICD-10-CM | POA: Diagnosis not present

## 2018-03-27 DIAGNOSIS — G43109 Migraine with aura, not intractable, without status migrainosus: Secondary | ICD-10-CM | POA: Diagnosis not present

## 2018-03-28 DIAGNOSIS — G43109 Migraine with aura, not intractable, without status migrainosus: Secondary | ICD-10-CM | POA: Diagnosis not present

## 2018-03-29 DIAGNOSIS — G43109 Migraine with aura, not intractable, without status migrainosus: Secondary | ICD-10-CM | POA: Diagnosis not present

## 2018-03-30 DIAGNOSIS — G43109 Migraine with aura, not intractable, without status migrainosus: Secondary | ICD-10-CM | POA: Diagnosis not present

## 2018-03-31 DIAGNOSIS — G43109 Migraine with aura, not intractable, without status migrainosus: Secondary | ICD-10-CM | POA: Diagnosis not present

## 2018-04-01 DIAGNOSIS — G43109 Migraine with aura, not intractable, without status migrainosus: Secondary | ICD-10-CM | POA: Diagnosis not present

## 2018-04-02 DIAGNOSIS — G43109 Migraine with aura, not intractable, without status migrainosus: Secondary | ICD-10-CM | POA: Diagnosis not present

## 2018-04-03 DIAGNOSIS — G43109 Migraine with aura, not intractable, without status migrainosus: Secondary | ICD-10-CM | POA: Diagnosis not present

## 2018-04-04 DIAGNOSIS — G43109 Migraine with aura, not intractable, without status migrainosus: Secondary | ICD-10-CM | POA: Diagnosis not present

## 2018-04-05 DIAGNOSIS — G43109 Migraine with aura, not intractable, without status migrainosus: Secondary | ICD-10-CM | POA: Diagnosis not present

## 2018-04-06 DIAGNOSIS — G43109 Migraine with aura, not intractable, without status migrainosus: Secondary | ICD-10-CM | POA: Diagnosis not present

## 2018-04-07 ENCOUNTER — Other Ambulatory Visit: Payer: Self-pay | Admitting: Pharmacist

## 2018-04-07 DIAGNOSIS — G43109 Migraine with aura, not intractable, without status migrainosus: Secondary | ICD-10-CM | POA: Diagnosis not present

## 2018-04-07 DIAGNOSIS — E119 Type 2 diabetes mellitus without complications: Secondary | ICD-10-CM

## 2018-04-07 MED ORDER — METFORMIN HCL 1000 MG PO TABS: 1000.0000 mg | ORAL_TABLET | Freq: Two times a day (BID) | ORAL | 2 refills | Status: DC

## 2018-04-07 MED ORDER — EMPAGLIFLOZIN 10 MG PO TABS: 10.0000 mg | ORAL_TABLET | Freq: Every day | ORAL | 2 refills | Status: DC

## 2018-04-08 DIAGNOSIS — G43109 Migraine with aura, not intractable, without status migrainosus: Secondary | ICD-10-CM | POA: Diagnosis not present

## 2018-04-09 DIAGNOSIS — G43109 Migraine with aura, not intractable, without status migrainosus: Secondary | ICD-10-CM | POA: Diagnosis not present

## 2018-04-10 DIAGNOSIS — G43109 Migraine with aura, not intractable, without status migrainosus: Secondary | ICD-10-CM | POA: Diagnosis not present

## 2018-04-11 DIAGNOSIS — G43109 Migraine with aura, not intractable, without status migrainosus: Secondary | ICD-10-CM | POA: Diagnosis not present

## 2018-04-12 DIAGNOSIS — G43109 Migraine with aura, not intractable, without status migrainosus: Secondary | ICD-10-CM | POA: Diagnosis not present

## 2018-04-13 DIAGNOSIS — G43109 Migraine with aura, not intractable, without status migrainosus: Secondary | ICD-10-CM | POA: Diagnosis not present

## 2018-04-14 DIAGNOSIS — G43109 Migraine with aura, not intractable, without status migrainosus: Secondary | ICD-10-CM | POA: Diagnosis not present

## 2018-04-15 DIAGNOSIS — G43109 Migraine with aura, not intractable, without status migrainosus: Secondary | ICD-10-CM | POA: Diagnosis not present

## 2018-04-16 DIAGNOSIS — G43109 Migraine with aura, not intractable, without status migrainosus: Secondary | ICD-10-CM | POA: Diagnosis not present

## 2018-04-17 DIAGNOSIS — G43109 Migraine with aura, not intractable, without status migrainosus: Secondary | ICD-10-CM | POA: Diagnosis not present

## 2018-04-18 DIAGNOSIS — G43109 Migraine with aura, not intractable, without status migrainosus: Secondary | ICD-10-CM | POA: Diagnosis not present

## 2018-04-19 DIAGNOSIS — G43109 Migraine with aura, not intractable, without status migrainosus: Secondary | ICD-10-CM | POA: Diagnosis not present

## 2018-04-20 DIAGNOSIS — G43109 Migraine with aura, not intractable, without status migrainosus: Secondary | ICD-10-CM | POA: Diagnosis not present

## 2018-04-21 DIAGNOSIS — G43109 Migraine with aura, not intractable, without status migrainosus: Secondary | ICD-10-CM | POA: Diagnosis not present

## 2018-04-22 DIAGNOSIS — G43109 Migraine with aura, not intractable, without status migrainosus: Secondary | ICD-10-CM | POA: Diagnosis not present

## 2018-04-23 DIAGNOSIS — G43109 Migraine with aura, not intractable, without status migrainosus: Secondary | ICD-10-CM | POA: Diagnosis not present

## 2018-04-24 ENCOUNTER — Ambulatory Visit: Payer: Self-pay | Admitting: Internal Medicine

## 2018-04-24 DIAGNOSIS — G43109 Migraine with aura, not intractable, without status migrainosus: Secondary | ICD-10-CM | POA: Diagnosis not present

## 2018-04-25 DIAGNOSIS — G43109 Migraine with aura, not intractable, without status migrainosus: Secondary | ICD-10-CM | POA: Diagnosis not present

## 2018-04-26 DIAGNOSIS — G43109 Migraine with aura, not intractable, without status migrainosus: Secondary | ICD-10-CM | POA: Diagnosis not present

## 2018-04-27 DIAGNOSIS — G43109 Migraine with aura, not intractable, without status migrainosus: Secondary | ICD-10-CM | POA: Diagnosis not present

## 2018-04-28 DIAGNOSIS — G43109 Migraine with aura, not intractable, without status migrainosus: Secondary | ICD-10-CM | POA: Diagnosis not present

## 2018-04-29 DIAGNOSIS — G43109 Migraine with aura, not intractable, without status migrainosus: Secondary | ICD-10-CM | POA: Diagnosis not present

## 2018-04-30 DIAGNOSIS — G43109 Migraine with aura, not intractable, without status migrainosus: Secondary | ICD-10-CM | POA: Diagnosis not present

## 2018-05-01 DIAGNOSIS — G43109 Migraine with aura, not intractable, without status migrainosus: Secondary | ICD-10-CM | POA: Diagnosis not present

## 2018-05-02 ENCOUNTER — Ambulatory Visit: Payer: Self-pay | Admitting: Internal Medicine

## 2018-05-02 DIAGNOSIS — G43109 Migraine with aura, not intractable, without status migrainosus: Secondary | ICD-10-CM | POA: Diagnosis not present

## 2018-05-03 DIAGNOSIS — G43109 Migraine with aura, not intractable, without status migrainosus: Secondary | ICD-10-CM | POA: Diagnosis not present

## 2018-05-04 DIAGNOSIS — G43109 Migraine with aura, not intractable, without status migrainosus: Secondary | ICD-10-CM | POA: Diagnosis not present

## 2018-05-05 DIAGNOSIS — G43109 Migraine with aura, not intractable, without status migrainosus: Secondary | ICD-10-CM | POA: Diagnosis not present

## 2018-05-06 DIAGNOSIS — G43109 Migraine with aura, not intractable, without status migrainosus: Secondary | ICD-10-CM | POA: Diagnosis not present

## 2018-05-07 DIAGNOSIS — G43109 Migraine with aura, not intractable, without status migrainosus: Secondary | ICD-10-CM | POA: Diagnosis not present

## 2018-05-08 ENCOUNTER — Other Ambulatory Visit: Payer: Self-pay | Admitting: Internal Medicine

## 2018-05-08 DIAGNOSIS — E1165 Type 2 diabetes mellitus with hyperglycemia: Secondary | ICD-10-CM

## 2018-05-08 DIAGNOSIS — G43109 Migraine with aura, not intractable, without status migrainosus: Secondary | ICD-10-CM | POA: Diagnosis not present

## 2018-05-09 DIAGNOSIS — G43109 Migraine with aura, not intractable, without status migrainosus: Secondary | ICD-10-CM | POA: Diagnosis not present

## 2018-05-10 DIAGNOSIS — G43109 Migraine with aura, not intractable, without status migrainosus: Secondary | ICD-10-CM | POA: Diagnosis not present

## 2018-05-11 DIAGNOSIS — G43109 Migraine with aura, not intractable, without status migrainosus: Secondary | ICD-10-CM | POA: Diagnosis not present

## 2018-05-12 DIAGNOSIS — G43109 Migraine with aura, not intractable, without status migrainosus: Secondary | ICD-10-CM | POA: Diagnosis not present

## 2018-05-13 DIAGNOSIS — G43109 Migraine with aura, not intractable, without status migrainosus: Secondary | ICD-10-CM | POA: Diagnosis not present

## 2018-05-14 DIAGNOSIS — G43109 Migraine with aura, not intractable, without status migrainosus: Secondary | ICD-10-CM | POA: Diagnosis not present

## 2018-05-15 DIAGNOSIS — G43109 Migraine with aura, not intractable, without status migrainosus: Secondary | ICD-10-CM | POA: Diagnosis not present

## 2018-05-16 DIAGNOSIS — G43109 Migraine with aura, not intractable, without status migrainosus: Secondary | ICD-10-CM | POA: Diagnosis not present

## 2018-05-17 DIAGNOSIS — G43109 Migraine with aura, not intractable, without status migrainosus: Secondary | ICD-10-CM | POA: Diagnosis not present

## 2018-05-18 DIAGNOSIS — G43109 Migraine with aura, not intractable, without status migrainosus: Secondary | ICD-10-CM | POA: Diagnosis not present

## 2018-05-19 DIAGNOSIS — G43109 Migraine with aura, not intractable, without status migrainosus: Secondary | ICD-10-CM | POA: Diagnosis not present

## 2018-05-20 DIAGNOSIS — G43109 Migraine with aura, not intractable, without status migrainosus: Secondary | ICD-10-CM | POA: Diagnosis not present

## 2018-05-21 DIAGNOSIS — G43109 Migraine with aura, not intractable, without status migrainosus: Secondary | ICD-10-CM | POA: Diagnosis not present

## 2018-05-22 DIAGNOSIS — G43109 Migraine with aura, not intractable, without status migrainosus: Secondary | ICD-10-CM | POA: Diagnosis not present

## 2018-05-23 DIAGNOSIS — G43109 Migraine with aura, not intractable, without status migrainosus: Secondary | ICD-10-CM | POA: Diagnosis not present

## 2018-05-24 DIAGNOSIS — G43109 Migraine with aura, not intractable, without status migrainosus: Secondary | ICD-10-CM | POA: Diagnosis not present

## 2018-05-25 ENCOUNTER — Other Ambulatory Visit: Payer: Self-pay

## 2018-05-25 ENCOUNTER — Ambulatory Visit: Payer: Medicaid Other | Attending: Family Medicine | Admitting: Physician Assistant

## 2018-05-25 DIAGNOSIS — S161XXA Strain of muscle, fascia and tendon at neck level, initial encounter: Secondary | ICD-10-CM

## 2018-05-25 DIAGNOSIS — G43109 Migraine with aura, not intractable, without status migrainosus: Secondary | ICD-10-CM | POA: Diagnosis not present

## 2018-05-25 MED ORDER — METHOCARBAMOL 500 MG PO TABS: 500.0000 mg | ORAL_TABLET | Freq: Three times a day (TID) | ORAL | 0 refills | Status: DC

## 2018-05-25 MED ORDER — MELOXICAM 15 MG PO TABS: 15.0000 mg | ORAL_TABLET | Freq: Every day | ORAL | 2 refills | Status: DC

## 2018-05-25 NOTE — Progress Notes (Signed)
Pt daughter states pt has been having neck pain for 4 days   Pt daughter states she hasn't taken anything for the pain

## 2018-05-25 NOTE — Progress Notes (Signed)
Patient ID: April Mcmahon, female   DOB: November 28, 1960, 58 y.o.   MRN: [id number]       Virtual Visit via Telephone Note  I connected with April Mcmahon on 05/25/18 at  9:50 AM EDT by telephone and verified that I am speaking with the correct person using two identifiers.   I discussed the limitations, risks, security and privacy concerns of performing an evaluation and management service by telephone and the availability of in person appointments. I also discussed with the patient that there may be a patient responsible charge related to this service. The patient expressed understanding and agreed to proceed.  Patient location:  home My Location:  [place] office Persons on the call:  Daughter of patient-April Mcmahon, the patient and myself   History of Present Illness:  C/o sides of neck hurting.  No paresthesias. B sides of neck are painful. NKI to neck. Not sleeping in new bed, new pillows, and denies new activity.  No CP/SOB.  Hurts to turn neck side to side/worse with movement.  No fever.  No s/sx of illness.  Tylenol not helping.  Voltaren gel not helping.  Blood sugars under 130    Observations/Objective:  TP linear.  Speech is clear   Assessment and Plan: 1. Strain of neck muscle, initial encounter No red flags by history. - meloxicam (MOBIC) 15 MG tablet; Take 1 tablet (15 mg total) by mouth daily. X 5 days then prn pain  Dispense: 30 tablet; Refill: 2 - methocarbamol (ROBAXIN) 500 MG tablet; Take 1 tablet (500 mg total) by mouth 3 (three) times daily. X 5 days then prn muscle spasm  Dispense: 60 tablet; Refill: 0    Follow Up Instructions: To ED/UC if worsens   I discussed the assessment and treatment plan with the patient. The patient was provided an opportunity to ask questions and all were answered. The patient agreed with the plan and demonstrated an understanding of the instructions.   The patient was advised to call back or seek an in-person evaluation if the symptoms worsen or if  the condition fails to improve as anticipated.  I provided 13 minutes of non-face-to-face time during this encounter.   Freeman Caldron, PA-C

## 2018-05-26 DIAGNOSIS — G43109 Migraine with aura, not intractable, without status migrainosus: Secondary | ICD-10-CM | POA: Diagnosis not present

## 2018-05-27 DIAGNOSIS — G43109 Migraine with aura, not intractable, without status migrainosus: Secondary | ICD-10-CM | POA: Diagnosis not present

## 2018-05-28 DIAGNOSIS — G43109 Migraine with aura, not intractable, without status migrainosus: Secondary | ICD-10-CM | POA: Diagnosis not present

## 2018-05-29 DIAGNOSIS — G43109 Migraine with aura, not intractable, without status migrainosus: Secondary | ICD-10-CM | POA: Diagnosis not present

## 2018-05-30 DIAGNOSIS — G43109 Migraine with aura, not intractable, without status migrainosus: Secondary | ICD-10-CM | POA: Diagnosis not present

## 2018-05-31 DIAGNOSIS — G43109 Migraine with aura, not intractable, without status migrainosus: Secondary | ICD-10-CM | POA: Diagnosis not present

## 2018-06-01 DIAGNOSIS — G43109 Migraine with aura, not intractable, without status migrainosus: Secondary | ICD-10-CM | POA: Diagnosis not present

## 2018-06-02 DIAGNOSIS — G43109 Migraine with aura, not intractable, without status migrainosus: Secondary | ICD-10-CM | POA: Diagnosis not present

## 2018-06-03 DIAGNOSIS — G43109 Migraine with aura, not intractable, without status migrainosus: Secondary | ICD-10-CM | POA: Diagnosis not present

## 2018-06-04 DIAGNOSIS — G43109 Migraine with aura, not intractable, without status migrainosus: Secondary | ICD-10-CM | POA: Diagnosis not present

## 2018-06-05 DIAGNOSIS — G43109 Migraine with aura, not intractable, without status migrainosus: Secondary | ICD-10-CM | POA: Diagnosis not present

## 2018-06-06 ENCOUNTER — Other Ambulatory Visit: Payer: Self-pay | Admitting: Internal Medicine

## 2018-06-06 DIAGNOSIS — E7800 Pure hypercholesterolemia, unspecified: Secondary | ICD-10-CM

## 2018-06-06 DIAGNOSIS — G43109 Migraine with aura, not intractable, without status migrainosus: Secondary | ICD-10-CM | POA: Diagnosis not present

## 2018-06-06 DIAGNOSIS — E1142 Type 2 diabetes mellitus with diabetic polyneuropathy: Secondary | ICD-10-CM

## 2018-06-07 DIAGNOSIS — G43109 Migraine with aura, not intractable, without status migrainosus: Secondary | ICD-10-CM | POA: Diagnosis not present

## 2018-06-08 DIAGNOSIS — G43109 Migraine with aura, not intractable, without status migrainosus: Secondary | ICD-10-CM | POA: Diagnosis not present

## 2018-06-09 DIAGNOSIS — G43109 Migraine with aura, not intractable, without status migrainosus: Secondary | ICD-10-CM | POA: Diagnosis not present

## 2018-06-10 DIAGNOSIS — G43109 Migraine with aura, not intractable, without status migrainosus: Secondary | ICD-10-CM | POA: Diagnosis not present

## 2018-06-11 DIAGNOSIS — G43109 Migraine with aura, not intractable, without status migrainosus: Secondary | ICD-10-CM | POA: Diagnosis not present

## 2018-06-12 DIAGNOSIS — G43109 Migraine with aura, not intractable, without status migrainosus: Secondary | ICD-10-CM | POA: Diagnosis not present

## 2018-06-13 DIAGNOSIS — G43109 Migraine with aura, not intractable, without status migrainosus: Secondary | ICD-10-CM | POA: Diagnosis not present

## 2018-06-14 DIAGNOSIS — G43109 Migraine with aura, not intractable, without status migrainosus: Secondary | ICD-10-CM | POA: Diagnosis not present

## 2018-06-15 DIAGNOSIS — G43109 Migraine with aura, not intractable, without status migrainosus: Secondary | ICD-10-CM | POA: Diagnosis not present

## 2018-06-16 DIAGNOSIS — G43109 Migraine with aura, not intractable, without status migrainosus: Secondary | ICD-10-CM | POA: Diagnosis not present

## 2018-06-17 DIAGNOSIS — G43109 Migraine with aura, not intractable, without status migrainosus: Secondary | ICD-10-CM | POA: Diagnosis not present

## 2018-06-18 DIAGNOSIS — G43109 Migraine with aura, not intractable, without status migrainosus: Secondary | ICD-10-CM | POA: Diagnosis not present

## 2018-06-19 DIAGNOSIS — G43109 Migraine with aura, not intractable, without status migrainosus: Secondary | ICD-10-CM | POA: Diagnosis not present

## 2018-06-20 DIAGNOSIS — G43109 Migraine with aura, not intractable, without status migrainosus: Secondary | ICD-10-CM | POA: Diagnosis not present

## 2018-06-21 DIAGNOSIS — G43109 Migraine with aura, not intractable, without status migrainosus: Secondary | ICD-10-CM | POA: Diagnosis not present

## 2018-06-22 DIAGNOSIS — G43109 Migraine with aura, not intractable, without status migrainosus: Secondary | ICD-10-CM | POA: Diagnosis not present

## 2018-06-23 ENCOUNTER — Other Ambulatory Visit: Payer: Self-pay | Admitting: Physician Assistant

## 2018-06-23 DIAGNOSIS — G43109 Migraine with aura, not intractable, without status migrainosus: Secondary | ICD-10-CM | POA: Diagnosis not present

## 2018-06-23 DIAGNOSIS — I10 Essential (primary) hypertension: Secondary | ICD-10-CM

## 2018-06-24 DIAGNOSIS — G43109 Migraine with aura, not intractable, without status migrainosus: Secondary | ICD-10-CM | POA: Diagnosis not present

## 2018-06-25 DIAGNOSIS — G43109 Migraine with aura, not intractable, without status migrainosus: Secondary | ICD-10-CM | POA: Diagnosis not present

## 2018-06-26 DIAGNOSIS — G43109 Migraine with aura, not intractable, without status migrainosus: Secondary | ICD-10-CM | POA: Diagnosis not present

## 2018-06-27 DIAGNOSIS — G43109 Migraine with aura, not intractable, without status migrainosus: Secondary | ICD-10-CM | POA: Diagnosis not present

## 2018-06-28 DIAGNOSIS — G43109 Migraine with aura, not intractable, without status migrainosus: Secondary | ICD-10-CM | POA: Diagnosis not present

## 2018-06-29 DIAGNOSIS — G43109 Migraine with aura, not intractable, without status migrainosus: Secondary | ICD-10-CM | POA: Diagnosis not present

## 2018-06-30 DIAGNOSIS — G43109 Migraine with aura, not intractable, without status migrainosus: Secondary | ICD-10-CM | POA: Diagnosis not present

## 2018-07-01 DIAGNOSIS — G43109 Migraine with aura, not intractable, without status migrainosus: Secondary | ICD-10-CM | POA: Diagnosis not present

## 2018-07-02 DIAGNOSIS — G43109 Migraine with aura, not intractable, without status migrainosus: Secondary | ICD-10-CM | POA: Diagnosis not present

## 2018-07-03 DIAGNOSIS — G43109 Migraine with aura, not intractable, without status migrainosus: Secondary | ICD-10-CM | POA: Diagnosis not present

## 2018-07-04 DIAGNOSIS — G43109 Migraine with aura, not intractable, without status migrainosus: Secondary | ICD-10-CM | POA: Diagnosis not present

## 2018-07-05 DIAGNOSIS — G43109 Migraine with aura, not intractable, without status migrainosus: Secondary | ICD-10-CM | POA: Diagnosis not present

## 2018-07-06 DIAGNOSIS — G43109 Migraine with aura, not intractable, without status migrainosus: Secondary | ICD-10-CM | POA: Diagnosis not present

## 2018-07-07 DIAGNOSIS — G43109 Migraine with aura, not intractable, without status migrainosus: Secondary | ICD-10-CM | POA: Diagnosis not present

## 2018-07-08 DIAGNOSIS — G43109 Migraine with aura, not intractable, without status migrainosus: Secondary | ICD-10-CM | POA: Diagnosis not present

## 2018-07-09 DIAGNOSIS — G43109 Migraine with aura, not intractable, without status migrainosus: Secondary | ICD-10-CM | POA: Diagnosis not present

## 2018-07-10 DIAGNOSIS — G43109 Migraine with aura, not intractable, without status migrainosus: Secondary | ICD-10-CM | POA: Diagnosis not present

## 2018-07-11 DIAGNOSIS — G43109 Migraine with aura, not intractable, without status migrainosus: Secondary | ICD-10-CM | POA: Diagnosis not present

## 2018-07-12 DIAGNOSIS — G43109 Migraine with aura, not intractable, without status migrainosus: Secondary | ICD-10-CM | POA: Diagnosis not present

## 2018-07-13 DIAGNOSIS — G43109 Migraine with aura, not intractable, without status migrainosus: Secondary | ICD-10-CM | POA: Diagnosis not present

## 2018-07-14 DIAGNOSIS — G43109 Migraine with aura, not intractable, without status migrainosus: Secondary | ICD-10-CM | POA: Diagnosis not present

## 2018-07-15 DIAGNOSIS — G43109 Migraine with aura, not intractable, without status migrainosus: Secondary | ICD-10-CM | POA: Diagnosis not present

## 2018-07-16 DIAGNOSIS — G43109 Migraine with aura, not intractable, without status migrainosus: Secondary | ICD-10-CM | POA: Diagnosis not present

## 2018-07-17 DIAGNOSIS — G43109 Migraine with aura, not intractable, without status migrainosus: Secondary | ICD-10-CM | POA: Diagnosis not present

## 2018-07-18 DIAGNOSIS — G43109 Migraine with aura, not intractable, without status migrainosus: Secondary | ICD-10-CM | POA: Diagnosis not present

## 2018-07-19 DIAGNOSIS — G43109 Migraine with aura, not intractable, without status migrainosus: Secondary | ICD-10-CM | POA: Diagnosis not present

## 2018-07-20 DIAGNOSIS — G43109 Migraine with aura, not intractable, without status migrainosus: Secondary | ICD-10-CM | POA: Diagnosis not present

## 2018-07-21 DIAGNOSIS — G43109 Migraine with aura, not intractable, without status migrainosus: Secondary | ICD-10-CM | POA: Diagnosis not present

## 2018-07-22 DIAGNOSIS — G43109 Migraine with aura, not intractable, without status migrainosus: Secondary | ICD-10-CM | POA: Diagnosis not present

## 2018-07-23 DIAGNOSIS — G43109 Migraine with aura, not intractable, without status migrainosus: Secondary | ICD-10-CM | POA: Diagnosis not present

## 2018-07-24 DIAGNOSIS — G43109 Migraine with aura, not intractable, without status migrainosus: Secondary | ICD-10-CM | POA: Diagnosis not present

## 2018-07-25 DIAGNOSIS — G43109 Migraine with aura, not intractable, without status migrainosus: Secondary | ICD-10-CM | POA: Diagnosis not present

## 2018-07-26 DIAGNOSIS — G43109 Migraine with aura, not intractable, without status migrainosus: Secondary | ICD-10-CM | POA: Diagnosis not present

## 2018-07-27 DIAGNOSIS — G43109 Migraine with aura, not intractable, without status migrainosus: Secondary | ICD-10-CM | POA: Diagnosis not present

## 2018-07-28 DIAGNOSIS — G43109 Migraine with aura, not intractable, without status migrainosus: Secondary | ICD-10-CM | POA: Diagnosis not present

## 2018-07-29 DIAGNOSIS — G43109 Migraine with aura, not intractable, without status migrainosus: Secondary | ICD-10-CM | POA: Diagnosis not present

## 2018-07-30 DIAGNOSIS — G43109 Migraine with aura, not intractable, without status migrainosus: Secondary | ICD-10-CM | POA: Diagnosis not present

## 2018-07-31 DIAGNOSIS — G43109 Migraine with aura, not intractable, without status migrainosus: Secondary | ICD-10-CM | POA: Diagnosis not present

## 2018-08-01 DIAGNOSIS — G43109 Migraine with aura, not intractable, without status migrainosus: Secondary | ICD-10-CM | POA: Diagnosis not present

## 2018-08-02 DIAGNOSIS — G43109 Migraine with aura, not intractable, without status migrainosus: Secondary | ICD-10-CM | POA: Diagnosis not present

## 2018-08-03 DIAGNOSIS — G43109 Migraine with aura, not intractable, without status migrainosus: Secondary | ICD-10-CM | POA: Diagnosis not present

## 2018-08-04 ENCOUNTER — Encounter (HOSPITAL_COMMUNITY): Payer: Self-pay | Admitting: Emergency Medicine

## 2018-08-04 ENCOUNTER — Ambulatory Visit (INDEPENDENT_AMBULATORY_CARE_PROVIDER_SITE_OTHER): Payer: Medicaid Other

## 2018-08-04 ENCOUNTER — Ambulatory Visit (HOSPITAL_COMMUNITY)
Admission: EM | Admit: 2018-08-04 | Discharge: 2018-08-04 | Disposition: A | Discharge status: Home / Self Care | Payer: Medicaid Other | Attending: Family Medicine | Admitting: Family Medicine

## 2018-08-04 DIAGNOSIS — S92425A Nondisplaced fracture of distal phalanx of left great toe, initial encounter for closed fracture: Secondary | ICD-10-CM

## 2018-08-04 DIAGNOSIS — G43109 Migraine with aura, not intractable, without status migrainosus: Secondary | ICD-10-CM | POA: Diagnosis not present

## 2018-08-04 DIAGNOSIS — S99922A Unspecified injury of left foot, initial encounter: Secondary | ICD-10-CM | POA: Diagnosis not present

## 2018-08-04 DIAGNOSIS — M79675 Pain in left toe(s): Secondary | ICD-10-CM | POA: Diagnosis not present

## 2018-08-04 NOTE — Discharge Instructions (Addendum)
Important to wear boot 24/7 unless needing to shower. May take, Tylenol as needed for pain. Important to rest, ice, elevate toe. Follow-up with Ortho.

## 2018-08-04 NOTE — ED Provider Notes (Signed)
[facility]    CSN: [id number] Arrival date & time: 08/04/18  1419     History   Chief Complaint Chief Complaint  Patient presents with   Fall   Toe Pain    HPI April Mcmahon is a 58 y.o. female with history of hypertension diabetes presenting for acute concern of left great toe pain.  Patient states that she fell 4 days ago and bumped her toe.  Patient has not tried anything for this, states that she is still having significant pain when trying to ambulate.  Patient denies swelling, redness, open wound, head trauma, loss of consciousness.    Past Medical History:  Diagnosis Date   Bilateral chronic knee pain 06/2014   Chronic low back pain 06/2014   Chronic migraine 02/1985   Diabetes mellitus without complication ([place]) 86/7619   Hypertension 09/2013    Patient Active Problem List   Diagnosis Date Noted   Muscle cramps 02/21/2018   Hepatic steatosis 12/23/2017   MCI (mild cognitive impairment) 02/14/2017   Diabetic peripheral neuropathy ([place]) 02/14/2017   Memory loss 12/27/2016   Dysfunctional uterine bleeding 08/30/2016   Analgesic rebound headache 06/24/2016   Esophageal dysphagia 06/21/2016   Tobacco dependence 01/20/2016   Plantar fasciitis, bilateral 12/24/2015   DJD (degenerative joint disease) of knee 06/20/2015   Diabetes type 2, controlled ([place]) 06/19/2015   Essential hypertension 06/19/2015   Vitamin D deficiency 09/24/2013   Hyperlipidemia 09/24/2013   Migraine 05/15/2013   Low back pain 08/18/2012    Past Surgical History:  Procedure Laterality Date   no previous surgeries      OB History   No obstetric history on file.      Home Medications    Prior to Admission medications   Medication Sig Start Date End Date Taking? Authorizing Provider  ACCU-CHEK SOFTCLIX LANCETS lancets Use as instructed 02/24/18   Ladell Pier, MD  amLODipine (NORVASC) 10 MG tablet TAKE 1 TABLET BY MOUTH EVERY DAY 06/23/18    Ladell Pier, MD  aspirin 325 MG tablet Take 650 mg by mouth every 6 (six) hours as needed. For headache    [provider]  atorvastatin (LIPITOR) 10 MG tablet TAKE ONE TABLET BY MOUTH EVERY DAY 06/07/18   Ladell Pier, MD  empagliflozin (JARDIANCE) 10 MG TABS tablet Take 10 mg by mouth daily. 04/07/18   Ladell Pier, MD  fluticasone (FLONASE) 50 MCG/ACT nasal spray Place 1 spray into both nostrils daily. 10/13/16   Argentina Donovan, PA-C  gabapentin (NEURONTIN) 300 MG capsule Take 1 capsule (300 mg total) by mouth 2 (two) times daily. 06/09/18   Ladell Pier, MD  glimepiride (AMARYL) 2 MG tablet Take 1 tablet (2 mg total) by mouth daily before breakfast. 02/21/18   Ladell Pier, MD  glucose blood (ACCU-CHEK AVIVA) test strip Use as instructed 02/24/18   Ladell Pier, MD  lisinopril (PRINIVIL,ZESTRIL) 40 MG tablet Take 1 tablet (40 mg total) by mouth daily. Patient not taking: Reported on 11/03/2017 02/04/17   Ladell Pier, MD  meloxicam (MOBIC) 15 MG tablet Take 1 tablet (15 mg total) by mouth daily. X 5 days then prn pain 05/25/18   Freeman Caldron M, PA-C  metFORMIN (GLUCOPHAGE) 1000 MG tablet Take 1 tablet (1,000 mg total) by mouth 2 (two) times daily with a meal. 04/07/18   Ladell Pier, MD  methocarbamol (ROBAXIN) 500 MG tablet Take 1 tablet (500 mg total) by mouth 3 (three) times  daily. X 5 days then prn muscle spasm 05/25/18   Argentina Donovan, PA-C  nicotine polacrilex (NICORETTE) 2 MG gum Take 1 each (2 mg total) by mouth as needed for smoking cessation. 12/23/17   Ladell Pier, MD  SUMAtriptan (IMITREX) 100 MG tablet Take 1 tablet (100 mg total) by mouth as needed for migraine. May repeat in 2 hours if headache persists or recurs.  Do not refill in less than 30 days 02/21/18   Ladell Pier, MD  venlafaxine [facility]) 37.5 MG tablet Take 1 tablet (37.5 mg total) by mouth 2 (two) times daily with a meal. 02/09/17   Marcial Pacas, MD    VOLTAREN 1 % GEL Apply 2 g topically 4 (four) times daily. 02/24/18   Ladell Pier, MD    Family History Family History  Problem Relation Age of Onset   Migraines Mother    Breast cancer Neg Hx     Social History Social History   Tobacco Use   Smoking status: Current Some Day Smoker   Smokeless tobacco: Never Used  Substance Use Topics   Alcohol use: No   Drug use: No     Allergies   Elavil [amitriptyline hcl]   Review of Systems As per HPI   Physical Exam Triage Vital Signs ED Triage Vitals [08/04/18 1454]  Enc Vitals Group     BP 138/69     Pulse Rate 72     Resp 18     Temp 98.3 F (36.8 C)     Temp src      SpO2 99 %     Weight      Height      Head Circumference      Peak Flow      Pain Score 10     Pain Loc      Pain Edu?      Excl. in [place]?    No data found.  Updated Vital Signs BP 138/69    Pulse 72    Temp 98.3 F (36.8 C)    Resp 18    LMP 10/24/2016    SpO2 99%   Visual Acuity Right Eye Distance:   Left Eye Distance:   Bilateral Distance:    Right Eye Near:   Left Eye Near:    Bilateral Near:     Physical Exam Constitutional:      General: She is not in acute distress. HENT:     Head: Normocephalic and atraumatic.  Eyes:     General: No scleral icterus.    Pupils: Pupils are equal, round, and reactive to light.  Cardiovascular:     Rate and Rhythm: Normal rate.  Pulmonary:     Effort: Pulmonary effort is normal.  Musculoskeletal:     Comments: No significant edema, erythema, ecchymosis of left great toe.  Patient is exquisitely tender to palpation of DIP and interphalangeal joint of left great toe.  Cap refill less than 2 seconds, NVI.  Skin:    Coloration: Skin is not jaundiced or pale.  Neurological:     Mental Status: She is alert and oriented to person, place, and time.      UC Treatments / Results  Labs (all labs ordered are listed, but only abnormal results are displayed) Labs Reviewed - No data to  display  EKG   Radiology Dg Toe Great Left  Result Date: 08/04/2018 CLINICAL DATA:  Pain after trauma. EXAM: LEFT GREAT TOE COMPARISON:  None. FINDINGS:  There is a fracture through the proximal aspect of the distal first phalanx, extending in the the interphalangeal joint. No other acute abnormalities identified. IMPRESSION: Fracture through the proximal aspect of the distal first phalanx extending into the interphalangeal joint. Electronically Signed   By: Dorise Bullion III M.D   On: 08/04/2018 15:39    Procedures Procedures (including critical care time)  Medications Ordered in UC Medications - No data to display  Initial Impression / Assessment and Plan / UC Course  I have reviewed the triage vital signs and the nursing notes.  Pertinent labs & imaging results that were available during my care of the patient were reviewed by me and considered in my medical decision making (see chart for details).     58 year old female presenting for left great toe pain status post fall.  X-ray done in office, reviewed by radiology: "Fracture through proximal aspect of distal first phalanx extending to the IP joint.  Patient given cam walker, instructed to follow-up with Ortho.  Encouraged use OTC NSAIDs and/or Tylenol for additional pain relief in conjunction with rest, ice, compression, elevation.  Return precautions discussed, patient verbalized understanding. Final Clinical Impressions(s) / UC Diagnoses   Final diagnoses:  Closed nondisplaced fracture of distal phalanx of left great toe, initial encounter     Discharge Instructions     Important to wear boot 24/7 unless needing to shower. May take, Tylenol as needed for pain. Important to rest, ice, elevate toe. Follow-up with Ortho.    ED Prescriptions    None     Controlled Substance Prescriptions [place] Controlled Substance Registry consulted? Not Applicable   Quincy Sheehan, [place] 08/04/18 1750

## 2018-08-04 NOTE — ED Triage Notes (Signed)
Pt states she fell 4 days ago and twisted her L big toe. C/o L toe pain. No swelling noted. Painful with walking.

## 2018-08-05 DIAGNOSIS — G43109 Migraine with aura, not intractable, without status migrainosus: Secondary | ICD-10-CM | POA: Diagnosis not present

## 2018-08-06 DIAGNOSIS — G43109 Migraine with aura, not intractable, without status migrainosus: Secondary | ICD-10-CM | POA: Diagnosis not present

## 2018-08-07 DIAGNOSIS — G43109 Migraine with aura, not intractable, without status migrainosus: Secondary | ICD-10-CM | POA: Diagnosis not present

## 2018-08-08 DIAGNOSIS — G43109 Migraine with aura, not intractable, without status migrainosus: Secondary | ICD-10-CM | POA: Diagnosis not present

## 2018-08-09 DIAGNOSIS — G43109 Migraine with aura, not intractable, without status migrainosus: Secondary | ICD-10-CM | POA: Diagnosis not present

## 2018-08-10 DIAGNOSIS — G43109 Migraine with aura, not intractable, without status migrainosus: Secondary | ICD-10-CM | POA: Diagnosis not present

## 2018-08-11 ENCOUNTER — Other Ambulatory Visit: Payer: Self-pay

## 2018-08-11 ENCOUNTER — Ambulatory Visit: Payer: Medicaid Other | Attending: Internal Medicine | Admitting: Internal Medicine

## 2018-08-11 ENCOUNTER — Encounter: Payer: Self-pay | Admitting: Internal Medicine

## 2018-08-11 VITALS — BP 134/69 | HR 78 | Temp 98.4°F | Resp 18 | Ht 59.0 in | Wt 154.0 lb

## 2018-08-11 DIAGNOSIS — E785 Hyperlipidemia, unspecified: Secondary | ICD-10-CM | POA: Diagnosis not present

## 2018-08-11 DIAGNOSIS — S92415A Nondisplaced fracture of proximal phalanx of left great toe, initial encounter for closed fracture: Secondary | ICD-10-CM | POA: Diagnosis not present

## 2018-08-11 DIAGNOSIS — E1165 Type 2 diabetes mellitus with hyperglycemia: Secondary | ICD-10-CM | POA: Diagnosis present

## 2018-08-11 DIAGNOSIS — Z1239 Encounter for other screening for malignant neoplasm of breast: Secondary | ICD-10-CM

## 2018-08-11 DIAGNOSIS — E11649 Type 2 diabetes mellitus with hypoglycemia without coma: Secondary | ICD-10-CM | POA: Diagnosis not present

## 2018-08-11 DIAGNOSIS — Z7984 Long term (current) use of oral hypoglycemic drugs: Secondary | ICD-10-CM | POA: Insufficient documentation

## 2018-08-11 DIAGNOSIS — I10 Essential (primary) hypertension: Secondary | ICD-10-CM | POA: Diagnosis not present

## 2018-08-11 DIAGNOSIS — Z888 Allergy status to other drugs, medicaments and biological substances status: Secondary | ICD-10-CM | POA: Diagnosis not present

## 2018-08-11 DIAGNOSIS — W19XXXA Unspecified fall, initial encounter: Secondary | ICD-10-CM | POA: Diagnosis not present

## 2018-08-11 DIAGNOSIS — Z1331 Encounter for screening for depression: Secondary | ICD-10-CM | POA: Diagnosis not present

## 2018-08-11 DIAGNOSIS — E1142 Type 2 diabetes mellitus with diabetic polyneuropathy: Secondary | ICD-10-CM | POA: Diagnosis not present

## 2018-08-11 DIAGNOSIS — F17200 Nicotine dependence, unspecified, uncomplicated: Secondary | ICD-10-CM

## 2018-08-11 DIAGNOSIS — F17210 Nicotine dependence, cigarettes, uncomplicated: Secondary | ICD-10-CM | POA: Insufficient documentation

## 2018-08-11 DIAGNOSIS — IMO0002 Reserved for concepts with insufficient information to code with codable children: Secondary | ICD-10-CM

## 2018-08-11 DIAGNOSIS — M170 Bilateral primary osteoarthritis of knee: Secondary | ICD-10-CM | POA: Diagnosis not present

## 2018-08-11 DIAGNOSIS — Z79899 Other long term (current) drug therapy: Secondary | ICD-10-CM | POA: Diagnosis not present

## 2018-08-11 DIAGNOSIS — G43109 Migraine with aura, not intractable, without status migrainosus: Secondary | ICD-10-CM | POA: Diagnosis not present

## 2018-08-11 LAB — POCT GLYCOSYLATED HEMOGLOBIN (HGB A1C): Hemoglobin A1C: 7.8 % — AB (ref 4.0–5.6)

## 2018-08-11 LAB — GLUCOSE, POCT (MANUAL RESULT ENTRY): POC Glucose: 288 mg/dl — AB (ref 70–99)

## 2018-08-11 MED ORDER — GLIMEPIRIDE 2 MG PO TABS: 1.0000 mg | ORAL_TABLET | Freq: Every day | ORAL | 3 refills | Status: DC

## 2018-08-11 MED ORDER — NICOTINE 14 MG/24HR TD PT24: 14.0000 mg | MEDICATED_PATCH | Freq: Every day | TRANSDERMAL | 1 refills | Status: DC

## 2018-08-11 NOTE — Progress Notes (Signed)
Patient ID: April Mcmahon, female    DOB: Oct 21, 1960  MRN: [id number]  CC:   Subjective: April Mcmahon is a 58 y.o. female who presents for chronic ds management.  Her daughter-in-law is with her.   Her concerns today include:  Pt with hx of DMneuropathy, HTN, HL, tob dep, migraines, Vit D def, DJD knees and plantar fasciitis, Abn LFT with steatosis on U/S.  Patient has a medication bottles with her today.  Seen UC 08/04/2018 for pain in the left big toe post fall.  She was found to have fracture of the proximal big toe.  She was referred to orthopedics.  On the day of the appointment as I got the directions mixed up did not make it there in time.   -When asked how she fell, patient reports that she was sitting on the couch between her husband and daughter-in-law talking to her grandchild on the phone.  She tells me that she fell from a seated position and not sure how.  Through much difficulty due to the language barrier, I learned that her blood sugar was low at 70 and she probably did not feel that it was low and she fell. -Her daughter-in-law gave her some orange juice to bring up the blood sugar.  Patient does not recall feeling shaky or dizzy prior to the event  DIABETES TYPE 2 Last A1C:   Results for orders placed or performed in visit on 08/11/18  HgB A1c  Result Value Ref Range   Hemoglobin A1C 7.8 (A) 4.0 - 5.6 %   HbA1c POC (<> result, manual entry)     HbA1c, POC (prediabetic range)     HbA1c, POC (controlled diabetic range)    Glucose (CBG)  Result Value Ref Range   POC Glucose 288 (A) 70 - 99 mg/dl    Med Adherence:  [x]  Yes -on last visit with me, we had decrease the Amaryl from 4 mg daily to 2 mg daily due to hypoglycemic episodes.  She is taking the 2 mg.  She is also taking the metformin and Jardiance    Medication side effects:  []  Yes    []  No Home Monitoring?  [x]  Yes -1-2 x a day in mornings before BF   Home glucose results range: 60-130.  Daughter-in-law reports BS  below 80 about 2 x a wk Diet Adherence: [x]  Yes.  She tells me that she gets in her 3 meals on time    []  No Exercise: [x]  Yes - active at home.     []  No Hypoglycemic episodes?: [x]  Yes   Numbness of the feet? []  Yes    [x]  No Retinopathy hx? []  Yes    [x]  No Last eye exam:  Due for eye exam Comments:   HTN: Does not have Lisinopril.  She only has Norvasc and has been taking that consistently every day.  She limits salt in the foods.  Denies any chest pains or shortness of breath.  No lower extremity swelling.   HL: Compliant with taking atorvastatin  Tob dep: still smoking.  "I'm looking for medicine to help me quit."  Did not get nicotine gum.  Wants to quit but states that she is finding it very difficult to do so because she smoked since she was very young.  She smokes about 2 to 4 cigarettes a day  Patient Active Problem List   Diagnosis Date Noted  . Muscle cramps 02/21/2018  . Hepatic steatosis 12/23/2017  . MCI (  mild cognitive impairment) 02/14/2017  . Diabetic peripheral neuropathy ([place]) 02/14/2017  . Memory loss 12/27/2016  . Dysfunctional uterine bleeding 08/30/2016  . Analgesic rebound headache 06/24/2016  . Esophageal dysphagia 06/21/2016  . Tobacco dependence 01/20/2016  . Plantar fasciitis, bilateral 12/24/2015  . 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 Diabetes type 2, controlled ([place]) 06/19/2015  . Essential hypertension 06/19/2015  . Vitamin D deficiency 09/24/2013  . Hyperlipidemia 09/24/2013  . Migraine 05/15/2013  . Low back pain 08/18/2012     Current Outpatient Medications on File Prior to Visit  Medication Sig Dispense Refill  . ACCU-CHEK SOFTCLIX LANCETS lancets Use as instructed 100 each 12  . amLODipine (NORVASC) 10 MG tablet TAKE 1 TABLET BY MOUTH EVERY DAY 90 tablet 0  . aspirin 325 MG tablet Take 650 mg by mouth every 6 (six) hours as needed. For headache    . atorvastatin (LIPITOR) 10 MG tablet TAKE ONE TABLET BY MOUTH EVERY DAY  30 tablet 2  . empagliflozin (JARDIANCE) 10 MG TABS tablet Take 10 mg by mouth daily. 30 tablet 2  . fluticasone (FLONASE) 50 MCG/ACT nasal spray Place 1 spray into both nostrils daily. 16 g 2  . gabapentin (NEURONTIN) 300 MG capsule Take 1 capsule (300 mg total) by mouth 2 (two) times daily. 60 capsule 5  . glucose blood (ACCU-CHEK AVIVA) test strip Use as instructed 100 each 12  . meloxicam (MOBIC) 15 MG tablet Take 1 tablet (15 mg total) by mouth daily. X 5 days then prn pain 30 tablet 2  . metFORMIN (GLUCOPHAGE) 1000 MG tablet Take 1 tablet (1,000 mg total) by mouth 2 (two) times daily with a meal. 60 tablet 2  . methocarbamol (ROBAXIN) 500 MG tablet Take 1 tablet (500 mg total) by mouth 3 (three) times daily. X 5 days then prn muscle spasm 60 tablet 0  . nicotine polacrilex (NICORETTE) 2 MG gum Take 1 each (2 mg total) by mouth as needed for smoking cessation. 100 tablet 1  . SUMAtriptan (IMITREX) 100 MG tablet Take 1 tablet (100 mg total) by mouth as needed for migraine. May repeat in 2 hours if headache persists or recurs.  Do not refill in less than 30 days 10 tablet 6  . venlafaxine (EFFEXOR) 37.5 MG tablet Take 1 tablet (37.5 mg total) by mouth 2 (two) times daily with a meal. 60 tablet 11  . VOLTAREN 1 % GEL Apply 2 g topically 4 (four) times daily. 100 g 2   No current facility-administered medications on file prior to visit.     Allergies  Allergen Reactions  . Elavil [Amitriptyline Hcl] Diarrhea and Nausea And Vomiting    Social History   Socioeconomic History  . Marital status: Married    Spouse name: Not on file  . Number of children: Not on file  . Years of education: Not on file  . Highest education level: Not on file  Occupational History  . Not on file  Social Needs  . Financial resource strain: Not on file  . Food insecurity    Worry: Not on file    Inability: Not on file  . Transportation needs    Medical: Not on file    Non-medical: Not on file  Tobacco  Use  . Smoking status: Current Some Day Smoker  . Smokeless tobacco: Never Used  Substance and Sexual Activity  . Alcohol use: No  . Drug use: No  . Sexual activity: Not on file  Lifestyle  .  Physical activity    Days per week: Not on file    Minutes per session: Not on file  . Stress: Not on file  Relationships  . Social Herbalist on phone: Not on file    Gets together: Not on file    Attends religious service: Not on file    Active member of club or organization: Not on file    Attends meetings of clubs or organizations: Not on file    Relationship status: Not on file  . Intimate partner violence    Fear of current or ex partner: Not on file    Emotionally abused: Not on file    Physically abused: Not on file    Forced sexual activity: Not on file  Other Topics Concern  . Not on file  Social History Narrative  . Not on file    Family History  Problem Relation Age of Onset  . Migraines Mother   . Breast cancer Neg Hx     Past Surgical History:  Procedure Laterality Date  . no previous surgeries      ROS: Review of Systems Negative except as stated above  PHYSICAL EXAM: BP 134/69   Pulse 78   Temp 98.4 F (36.9 C) (Oral)   Resp 18   Ht 4\' 11"  (1.499 m)   Wt 154 lb (69.9 kg)   LMP 10/24/2016   SpO2 98%   BMI 31.10 kg/m   Physical Exam  General appearance - alert, well appearing, and in no distress Mental status - normal mood, behavior, speech, dress, motor activity, and thought processes Neck - supple, no significant adenopathy Chest - clear to auscultation, no wheezes, rales or rhonchi, symmetric air entry Heart - normal rate, regular rhythm, normal S1, S2, no murmurs, rubs, clicks or gallops Extremities - peripheral pulses normal, no pedal edema, no clubbing or cyanosis MSK: Mild swelling of the proximal left big toe Diabetic Foot Exam - Simple   Simple Foot Form Visual Inspection No deformities, no ulcerations, no other skin breakdown  bilaterally: Yes Sensation Testing Intact to touch and monofilament testing bilaterally: Yes Pulse Check Posterior Tibialis and Dorsalis pulse intact bilaterally: Yes Comments    Results for orders placed or performed in visit on 08/11/18  HgB A1c  Result Value Ref Range   Hemoglobin A1C 7.8 (A) 4.0 - 5.6 %   HbA1c POC (<> result, manual entry)     HbA1c, POC (prediabetic range)     HbA1c, POC (controlled diabetic range)    Glucose (CBG)  Result Value Ref Range   POC Glucose 288 (A) 70 - 99 mg/dl    Diabetic Foot Exam - Simple   No data filed      Depression screen [facility] 2/9 08/11/2018 02/21/2018 09/22/2017  Decreased Interest 0 1 1  Down, Depressed, Hopeless 0 0 2  PHQ - 2 Score 0 1 3  Altered sleeping - - 2  Tired, decreased energy - - 3  Change in appetite - - 0  Feeling bad or failure about yourself  - - 1  Trouble concentrating - - 2  Moving slowly or fidgety/restless - - 1  Suicidal thoughts - - 0  PHQ-9 Score - - 12  Some recent data might be hidden    CMP Latest Ref Rng & Units 03/07/2018 12/23/2017 10/10/2017  Glucose 65 - 99 mg/dL 94 - 90  BUN 6 - 24 mg/dL 10 - 10  Creatinine 0.57 - 1.00 mg/dL 0.67 -  0.57  Sodium 134 - 144 mmol/L 140 - 138  Potassium 3.5 - 5.2 mmol/L 4.7 - 3.7  Chloride 96 - 106 mmol/L 103 - 106  CO2 20 - 29 mmol/L 22 - 20(L)  Calcium 8.7 - 10.2 mg/dL 9.6 - 9.2  Total Protein 6.0 - 8.5 g/dL 7.6 7.2 -  Total Bilirubin 0.0 - 1.2 mg/dL 0.5 0.3 -  Alkaline Phos 39 - 117 IU/L 139(H) 137(H) -  AST 0 - 40 IU/L 28 55(H) -  ALT 0 - 32 IU/L 36(H) 73(H) -   Lipid Panel     Component Value Date/Time   CHOL 198 12/26/2017 1149   TRIG 235 (H) 12/26/2017 1149   HDL 47 12/26/2017 1149   CHOLHDL 4.2 12/26/2017 1149   CHOLHDL 3.3 10/20/2015 1625   VLDL 46 (H) 10/20/2015 1625   LDLCALC 104 (H) 12/26/2017 1149    CBC    Component Value Date/Time   WBC 9.7 10/10/2017 1941   RBC 4.81 10/10/2017 1941   HGB 12.7 10/10/2017 1941   HGB 12.0 06/21/2017  1617   HCT 41.0 10/10/2017 1941   HCT 37.4 06/21/2017 1617   PLT 220 10/10/2017 1941   PLT 255 06/21/2017 1617   MCV 85.2 10/10/2017 1941   MCV 84 06/21/2017 1617   MCH 26.4 10/10/2017 1941   MCHC 31.0 10/10/2017 1941   RDW 13.5 10/10/2017 1941   RDW 14.0 06/21/2017 1617   LYMPHSABS 2,948 10/20/2015 1625   MONOABS 335 10/20/2015 1625   EOSABS 268 10/20/2015 1625   BASOSABS 0 10/20/2015 1625    ASSESSMENT AND PLAN: 1. Diabetes mellitus type 2, uncontrolled, with complications (HCC) C1K increased from previous visit.  However patient is having some hypoglycemic unawareness with recent episode resulting in a fall and fracture of the toe.  Therefore instead of trying to tightly control and get her A1c less than 7 I will aim to keep her between 7 and 8. -Decreased glimepiride to 1 mg daily. Went over how to treat low blood sugar episodes Encourage healthy eating habits - HgB A1c - Glucose (CBG) - glimepiride (AMARYL) 2 MG tablet; Take 0.5 tablets (1 mg total) by mouth daily before breakfast.  Dispense: 30 tablet; Refill: 3 - Ambulatory referral to Ophthalmology  2. Hypoglycemia associated with type 2 diabetes mellitus ([place]) See #1 above  3. Essential hypertension Close to goal.  Lisinopril 40 mg daily is on her med list but apparently she has not been taking this.  So I have taken it off the med list.  She will continue with amlodipine  4. Closed nondisplaced fracture of proximal phalanx of left great toe, initial encounter - Ambulatory referral to Orthopedic Surgery  5. Tobacco dependence Advised to quit.  Went over health risks associated with smoking.  She is willing to try the nicotine patch.  I will start her on the intermediate dose.  I went over how to use the patch. - nicotine (NICODERM CQ - DOSED IN MG/24 HOURS) 14 mg/24hr patch; Place 1 patch (14 mg total) onto the skin daily.  Dispense: 28 patch; Refill: 1  6. Breast cancer screening - MM Digital Screening; Future  I  spent 40 minutes with this patient in direct face-to-face evaluation, discussing diagnosis and coordinating care Patient was given the opportunity to ask questions.  Patient verbalized understanding of the plan and was able to repeat key elements of the plan.  [employer] interpreter used during this encounter. #[id number]   Orders Placed This Encounter  Procedures  .  MM Digital Screening  . Ambulatory referral to Orthopedic Surgery  . Ambulatory referral to Ophthalmology  . HgB A1c  . Glucose (CBG)     Requested Prescriptions   Signed Prescriptions Disp Refills  . glimepiride (AMARYL) 2 MG tablet 30 tablet 3    Sig: Take 0.5 tablets (1 mg total) by mouth daily before breakfast.  . nicotine (NICODERM CQ - DOSED IN MG/24 HOURS) 14 mg/24hr patch 28 patch 1    Sig: Place 1 patch (14 mg total) onto the skin daily.    Return in about 3 months (around 11/11/2018).  Karle Plumber, MD, FACP

## 2018-08-12 DIAGNOSIS — G43109 Migraine with aura, not intractable, without status migrainosus: Secondary | ICD-10-CM | POA: Diagnosis not present

## 2018-08-13 DIAGNOSIS — G43109 Migraine with aura, not intractable, without status migrainosus: Secondary | ICD-10-CM | POA: Diagnosis not present

## 2018-08-14 DIAGNOSIS — G43109 Migraine with aura, not intractable, without status migrainosus: Secondary | ICD-10-CM | POA: Diagnosis not present

## 2018-08-15 ENCOUNTER — Other Ambulatory Visit: Payer: Self-pay | Admitting: Internal Medicine

## 2018-08-15 DIAGNOSIS — G43109 Migraine with aura, not intractable, without status migrainosus: Secondary | ICD-10-CM | POA: Diagnosis not present

## 2018-08-15 DIAGNOSIS — E119 Type 2 diabetes mellitus without complications: Secondary | ICD-10-CM

## 2018-08-16 DIAGNOSIS — G43109 Migraine with aura, not intractable, without status migrainosus: Secondary | ICD-10-CM | POA: Diagnosis not present

## 2018-08-17 ENCOUNTER — Other Ambulatory Visit: Payer: Self-pay | Admitting: Internal Medicine

## 2018-08-17 DIAGNOSIS — E1165 Type 2 diabetes mellitus with hyperglycemia: Secondary | ICD-10-CM

## 2018-08-17 DIAGNOSIS — S161XXA Strain of muscle, fascia and tendon at neck level, initial encounter: Secondary | ICD-10-CM

## 2018-08-17 DIAGNOSIS — G43109 Migraine with aura, not intractable, without status migrainosus: Secondary | ICD-10-CM | POA: Diagnosis not present

## 2018-08-17 DIAGNOSIS — E7800 Pure hypercholesterolemia, unspecified: Secondary | ICD-10-CM

## 2018-08-17 DIAGNOSIS — IMO0002 Reserved for concepts with insufficient information to code with codable children: Secondary | ICD-10-CM

## 2018-08-18 DIAGNOSIS — G43109 Migraine with aura, not intractable, without status migrainosus: Secondary | ICD-10-CM | POA: Diagnosis not present

## 2018-08-19 DIAGNOSIS — G43109 Migraine with aura, not intractable, without status migrainosus: Secondary | ICD-10-CM | POA: Diagnosis not present

## 2018-08-20 DIAGNOSIS — G43109 Migraine with aura, not intractable, without status migrainosus: Secondary | ICD-10-CM | POA: Diagnosis not present

## 2018-08-21 DIAGNOSIS — G43109 Migraine with aura, not intractable, without status migrainosus: Secondary | ICD-10-CM | POA: Diagnosis not present

## 2018-08-22 DIAGNOSIS — G43109 Migraine with aura, not intractable, without status migrainosus: Secondary | ICD-10-CM | POA: Diagnosis not present

## 2018-08-23 DIAGNOSIS — S92425A Nondisplaced fracture of distal phalanx of left great toe, initial encounter for closed fracture: Secondary | ICD-10-CM | POA: Diagnosis not present

## 2018-08-23 DIAGNOSIS — S92423A Displaced fracture of distal phalanx of unspecified great toe, initial encounter for closed fracture: Secondary | ICD-10-CM | POA: Insufficient documentation

## 2018-08-23 DIAGNOSIS — G43109 Migraine with aura, not intractable, without status migrainosus: Secondary | ICD-10-CM | POA: Diagnosis not present

## 2018-08-23 DIAGNOSIS — M79675 Pain in left toe(s): Secondary | ICD-10-CM | POA: Diagnosis not present

## 2018-08-24 DIAGNOSIS — G43109 Migraine with aura, not intractable, without status migrainosus: Secondary | ICD-10-CM | POA: Diagnosis not present

## 2018-08-25 DIAGNOSIS — G43109 Migraine with aura, not intractable, without status migrainosus: Secondary | ICD-10-CM | POA: Diagnosis not present

## 2018-08-26 DIAGNOSIS — G43109 Migraine with aura, not intractable, without status migrainosus: Secondary | ICD-10-CM | POA: Diagnosis not present

## 2018-08-27 DIAGNOSIS — G43109 Migraine with aura, not intractable, without status migrainosus: Secondary | ICD-10-CM | POA: Diagnosis not present

## 2018-08-28 DIAGNOSIS — G43109 Migraine with aura, not intractable, without status migrainosus: Secondary | ICD-10-CM | POA: Diagnosis not present

## 2018-08-29 DIAGNOSIS — G43109 Migraine with aura, not intractable, without status migrainosus: Secondary | ICD-10-CM | POA: Diagnosis not present

## 2018-08-30 DIAGNOSIS — G43109 Migraine with aura, not intractable, without status migrainosus: Secondary | ICD-10-CM | POA: Diagnosis not present

## 2018-08-31 DIAGNOSIS — G43109 Migraine with aura, not intractable, without status migrainosus: Secondary | ICD-10-CM | POA: Diagnosis not present

## 2018-09-01 DIAGNOSIS — G43109 Migraine with aura, not intractable, without status migrainosus: Secondary | ICD-10-CM | POA: Diagnosis not present

## 2018-09-02 DIAGNOSIS — G43109 Migraine with aura, not intractable, without status migrainosus: Secondary | ICD-10-CM | POA: Diagnosis not present

## 2018-09-03 DIAGNOSIS — G43109 Migraine with aura, not intractable, without status migrainosus: Secondary | ICD-10-CM | POA: Diagnosis not present

## 2018-09-04 DIAGNOSIS — G43109 Migraine with aura, not intractable, without status migrainosus: Secondary | ICD-10-CM | POA: Diagnosis not present

## 2018-09-05 DIAGNOSIS — G43109 Migraine with aura, not intractable, without status migrainosus: Secondary | ICD-10-CM | POA: Diagnosis not present

## 2018-09-06 DIAGNOSIS — G43109 Migraine with aura, not intractable, without status migrainosus: Secondary | ICD-10-CM | POA: Diagnosis not present

## 2018-09-07 DIAGNOSIS — G43109 Migraine with aura, not intractable, without status migrainosus: Secondary | ICD-10-CM | POA: Diagnosis not present

## 2018-09-08 DIAGNOSIS — G43109 Migraine with aura, not intractable, without status migrainosus: Secondary | ICD-10-CM | POA: Diagnosis not present

## 2018-09-09 DIAGNOSIS — G43109 Migraine with aura, not intractable, without status migrainosus: Secondary | ICD-10-CM | POA: Diagnosis not present

## 2018-09-10 DIAGNOSIS — G43109 Migraine with aura, not intractable, without status migrainosus: Secondary | ICD-10-CM | POA: Diagnosis not present

## 2018-09-11 DIAGNOSIS — H2513 Age-related nuclear cataract, bilateral: Secondary | ICD-10-CM | POA: Diagnosis not present

## 2018-09-11 DIAGNOSIS — E119 Type 2 diabetes mellitus without complications: Secondary | ICD-10-CM | POA: Diagnosis not present

## 2018-09-11 DIAGNOSIS — G43109 Migraine with aura, not intractable, without status migrainosus: Secondary | ICD-10-CM | POA: Diagnosis not present

## 2018-09-11 DIAGNOSIS — Z7984 Long term (current) use of oral hypoglycemic drugs: Secondary | ICD-10-CM | POA: Diagnosis not present

## 2018-09-11 LAB — HM DIABETES EYE EXAM

## 2018-09-12 DIAGNOSIS — G43109 Migraine with aura, not intractable, without status migrainosus: Secondary | ICD-10-CM | POA: Diagnosis not present

## 2018-09-13 DIAGNOSIS — G43109 Migraine with aura, not intractable, without status migrainosus: Secondary | ICD-10-CM | POA: Diagnosis not present

## 2018-09-14 ENCOUNTER — Other Ambulatory Visit: Payer: Self-pay | Admitting: Internal Medicine

## 2018-09-14 DIAGNOSIS — G43109 Migraine with aura, not intractable, without status migrainosus: Secondary | ICD-10-CM | POA: Diagnosis not present

## 2018-09-14 DIAGNOSIS — E119 Type 2 diabetes mellitus without complications: Secondary | ICD-10-CM

## 2018-09-15 DIAGNOSIS — G43109 Migraine with aura, not intractable, without status migrainosus: Secondary | ICD-10-CM | POA: Diagnosis not present

## 2018-09-16 DIAGNOSIS — G43109 Migraine with aura, not intractable, without status migrainosus: Secondary | ICD-10-CM | POA: Diagnosis not present

## 2018-09-17 DIAGNOSIS — G43109 Migraine with aura, not intractable, without status migrainosus: Secondary | ICD-10-CM | POA: Diagnosis not present

## 2018-09-18 DIAGNOSIS — G43109 Migraine with aura, not intractable, without status migrainosus: Secondary | ICD-10-CM | POA: Diagnosis not present

## 2018-09-19 DIAGNOSIS — G43109 Migraine with aura, not intractable, without status migrainosus: Secondary | ICD-10-CM | POA: Diagnosis not present

## 2018-09-20 DIAGNOSIS — G43109 Migraine with aura, not intractable, without status migrainosus: Secondary | ICD-10-CM | POA: Diagnosis not present

## 2018-09-21 DIAGNOSIS — G43109 Migraine with aura, not intractable, without status migrainosus: Secondary | ICD-10-CM | POA: Diagnosis not present

## 2018-09-22 DIAGNOSIS — G43109 Migraine with aura, not intractable, without status migrainosus: Secondary | ICD-10-CM | POA: Diagnosis not present

## 2018-09-23 DIAGNOSIS — G43109 Migraine with aura, not intractable, without status migrainosus: Secondary | ICD-10-CM | POA: Diagnosis not present

## 2018-09-24 DIAGNOSIS — G43109 Migraine with aura, not intractable, without status migrainosus: Secondary | ICD-10-CM | POA: Diagnosis not present

## 2018-09-25 DIAGNOSIS — G43109 Migraine with aura, not intractable, without status migrainosus: Secondary | ICD-10-CM | POA: Diagnosis not present

## 2018-09-26 ENCOUNTER — Other Ambulatory Visit: Payer: Self-pay

## 2018-09-26 ENCOUNTER — Ambulatory Visit
Admission: RE | Admit: 2018-09-26 | Discharge: 2018-09-26 | Disposition: A | Discharge status: Home / Self Care | Payer: Medicaid Other | Source: Ambulatory Visit | Attending: Internal Medicine | Admitting: Internal Medicine

## 2018-09-26 DIAGNOSIS — Z1239 Encounter for other screening for malignant neoplasm of breast: Secondary | ICD-10-CM

## 2018-09-26 DIAGNOSIS — G43109 Migraine with aura, not intractable, without status migrainosus: Secondary | ICD-10-CM | POA: Diagnosis not present

## 2018-09-26 DIAGNOSIS — Z1231 Encounter for screening mammogram for malignant neoplasm of breast: Secondary | ICD-10-CM | POA: Diagnosis not present

## 2018-09-27 DIAGNOSIS — G43109 Migraine with aura, not intractable, without status migrainosus: Secondary | ICD-10-CM | POA: Diagnosis not present

## 2018-09-28 DIAGNOSIS — G43109 Migraine with aura, not intractable, without status migrainosus: Secondary | ICD-10-CM | POA: Diagnosis not present

## 2018-09-29 DIAGNOSIS — G43109 Migraine with aura, not intractable, without status migrainosus: Secondary | ICD-10-CM | POA: Diagnosis not present

## 2018-09-30 DIAGNOSIS — G43109 Migraine with aura, not intractable, without status migrainosus: Secondary | ICD-10-CM | POA: Diagnosis not present

## 2018-10-01 DIAGNOSIS — G43109 Migraine with aura, not intractable, without status migrainosus: Secondary | ICD-10-CM | POA: Diagnosis not present

## 2018-10-02 DIAGNOSIS — G43109 Migraine with aura, not intractable, without status migrainosus: Secondary | ICD-10-CM | POA: Diagnosis not present

## 2018-10-03 DIAGNOSIS — G43109 Migraine with aura, not intractable, without status migrainosus: Secondary | ICD-10-CM | POA: Diagnosis not present

## 2018-10-04 DIAGNOSIS — G43109 Migraine with aura, not intractable, without status migrainosus: Secondary | ICD-10-CM | POA: Diagnosis not present

## 2018-10-05 DIAGNOSIS — G43109 Migraine with aura, not intractable, without status migrainosus: Secondary | ICD-10-CM | POA: Diagnosis not present

## 2018-10-06 DIAGNOSIS — G43109 Migraine with aura, not intractable, without status migrainosus: Secondary | ICD-10-CM | POA: Diagnosis not present

## 2018-10-07 DIAGNOSIS — G43109 Migraine with aura, not intractable, without status migrainosus: Secondary | ICD-10-CM | POA: Diagnosis not present

## 2018-10-08 DIAGNOSIS — G43109 Migraine with aura, not intractable, without status migrainosus: Secondary | ICD-10-CM | POA: Diagnosis not present

## 2018-10-09 DIAGNOSIS — G43109 Migraine with aura, not intractable, without status migrainosus: Secondary | ICD-10-CM | POA: Diagnosis not present

## 2018-10-10 DIAGNOSIS — G43109 Migraine with aura, not intractable, without status migrainosus: Secondary | ICD-10-CM | POA: Diagnosis not present

## 2018-10-11 DIAGNOSIS — G43109 Migraine with aura, not intractable, without status migrainosus: Secondary | ICD-10-CM | POA: Diagnosis not present

## 2018-10-12 DIAGNOSIS — G43109 Migraine with aura, not intractable, without status migrainosus: Secondary | ICD-10-CM | POA: Diagnosis not present

## 2018-10-13 DIAGNOSIS — G43109 Migraine with aura, not intractable, without status migrainosus: Secondary | ICD-10-CM | POA: Diagnosis not present

## 2018-10-14 DIAGNOSIS — G43109 Migraine with aura, not intractable, without status migrainosus: Secondary | ICD-10-CM | POA: Diagnosis not present

## 2018-10-15 DIAGNOSIS — G43109 Migraine with aura, not intractable, without status migrainosus: Secondary | ICD-10-CM | POA: Diagnosis not present

## 2018-10-16 ENCOUNTER — Other Ambulatory Visit: Payer: Self-pay | Admitting: Internal Medicine

## 2018-10-16 DIAGNOSIS — E119 Type 2 diabetes mellitus without complications: Secondary | ICD-10-CM

## 2018-10-16 DIAGNOSIS — G43109 Migraine with aura, not intractable, without status migrainosus: Secondary | ICD-10-CM | POA: Diagnosis not present

## 2018-10-17 DIAGNOSIS — G43109 Migraine with aura, not intractable, without status migrainosus: Secondary | ICD-10-CM | POA: Diagnosis not present

## 2018-10-18 DIAGNOSIS — G43109 Migraine with aura, not intractable, without status migrainosus: Secondary | ICD-10-CM | POA: Diagnosis not present

## 2018-10-19 DIAGNOSIS — G43109 Migraine with aura, not intractable, without status migrainosus: Secondary | ICD-10-CM | POA: Diagnosis not present

## 2018-10-20 DIAGNOSIS — G43109 Migraine with aura, not intractable, without status migrainosus: Secondary | ICD-10-CM | POA: Diagnosis not present

## 2018-10-21 DIAGNOSIS — G43109 Migraine with aura, not intractable, without status migrainosus: Secondary | ICD-10-CM | POA: Diagnosis not present

## 2018-10-22 DIAGNOSIS — G43109 Migraine with aura, not intractable, without status migrainosus: Secondary | ICD-10-CM | POA: Diagnosis not present

## 2018-10-23 ENCOUNTER — Other Ambulatory Visit: Payer: Self-pay | Admitting: Internal Medicine

## 2018-10-23 DIAGNOSIS — F17200 Nicotine dependence, unspecified, uncomplicated: Secondary | ICD-10-CM

## 2018-10-23 DIAGNOSIS — G43109 Migraine with aura, not intractable, without status migrainosus: Secondary | ICD-10-CM | POA: Diagnosis not present

## 2018-10-24 DIAGNOSIS — G43109 Migraine with aura, not intractable, without status migrainosus: Secondary | ICD-10-CM | POA: Diagnosis not present

## 2018-10-25 DIAGNOSIS — G43109 Migraine with aura, not intractable, without status migrainosus: Secondary | ICD-10-CM | POA: Diagnosis not present

## 2018-10-26 DIAGNOSIS — G43109 Migraine with aura, not intractable, without status migrainosus: Secondary | ICD-10-CM | POA: Diagnosis not present

## 2018-10-27 DIAGNOSIS — G43109 Migraine with aura, not intractable, without status migrainosus: Secondary | ICD-10-CM | POA: Diagnosis not present

## 2018-10-28 DIAGNOSIS — G43109 Migraine with aura, not intractable, without status migrainosus: Secondary | ICD-10-CM | POA: Diagnosis not present

## 2018-10-29 DIAGNOSIS — G43109 Migraine with aura, not intractable, without status migrainosus: Secondary | ICD-10-CM | POA: Diagnosis not present

## 2018-10-30 DIAGNOSIS — G43109 Migraine with aura, not intractable, without status migrainosus: Secondary | ICD-10-CM | POA: Diagnosis not present

## 2018-10-31 DIAGNOSIS — G43109 Migraine with aura, not intractable, without status migrainosus: Secondary | ICD-10-CM | POA: Diagnosis not present

## 2018-11-01 DIAGNOSIS — G43109 Migraine with aura, not intractable, without status migrainosus: Secondary | ICD-10-CM | POA: Diagnosis not present

## 2018-11-02 DIAGNOSIS — G43109 Migraine with aura, not intractable, without status migrainosus: Secondary | ICD-10-CM | POA: Diagnosis not present

## 2018-11-03 DIAGNOSIS — G43109 Migraine with aura, not intractable, without status migrainosus: Secondary | ICD-10-CM | POA: Diagnosis not present

## 2018-11-04 DIAGNOSIS — G43109 Migraine with aura, not intractable, without status migrainosus: Secondary | ICD-10-CM | POA: Diagnosis not present

## 2018-11-05 DIAGNOSIS — G43109 Migraine with aura, not intractable, without status migrainosus: Secondary | ICD-10-CM | POA: Diagnosis not present

## 2018-11-06 DIAGNOSIS — G43109 Migraine with aura, not intractable, without status migrainosus: Secondary | ICD-10-CM | POA: Diagnosis not present

## 2018-11-07 DIAGNOSIS — G43109 Migraine with aura, not intractable, without status migrainosus: Secondary | ICD-10-CM | POA: Diagnosis not present

## 2018-11-08 ENCOUNTER — Other Ambulatory Visit: Payer: Self-pay | Admitting: Internal Medicine

## 2018-11-08 DIAGNOSIS — I10 Essential (primary) hypertension: Secondary | ICD-10-CM

## 2018-11-08 DIAGNOSIS — G43109 Migraine with aura, not intractable, without status migrainosus: Secondary | ICD-10-CM | POA: Diagnosis not present

## 2018-11-09 DIAGNOSIS — G43109 Migraine with aura, not intractable, without status migrainosus: Secondary | ICD-10-CM | POA: Diagnosis not present

## 2018-11-10 DIAGNOSIS — G43109 Migraine with aura, not intractable, without status migrainosus: Secondary | ICD-10-CM | POA: Diagnosis not present

## 2018-11-11 DIAGNOSIS — G43109 Migraine with aura, not intractable, without status migrainosus: Secondary | ICD-10-CM | POA: Diagnosis not present

## 2018-11-12 DIAGNOSIS — G43109 Migraine with aura, not intractable, without status migrainosus: Secondary | ICD-10-CM | POA: Diagnosis not present

## 2018-11-13 ENCOUNTER — Other Ambulatory Visit: Payer: Self-pay

## 2018-11-13 ENCOUNTER — Ambulatory Visit (HOSPITAL_BASED_OUTPATIENT_CLINIC_OR_DEPARTMENT_OTHER): Payer: Medicaid Other | Admitting: Pharmacist

## 2018-11-13 ENCOUNTER — Encounter: Payer: Self-pay | Admitting: Internal Medicine

## 2018-11-13 ENCOUNTER — Ambulatory Visit: Payer: Medicaid Other | Attending: Internal Medicine | Admitting: Internal Medicine

## 2018-11-13 ENCOUNTER — Other Ambulatory Visit: Payer: Self-pay | Admitting: Internal Medicine

## 2018-11-13 VITALS — BP 126/78 | HR 79 | Temp 97.7°F | Resp 16 | Wt 153.0 lb

## 2018-11-13 DIAGNOSIS — E1142 Type 2 diabetes mellitus with diabetic polyneuropathy: Secondary | ICD-10-CM

## 2018-11-13 DIAGNOSIS — K760 Fatty (change of) liver, not elsewhere classified: Secondary | ICD-10-CM | POA: Insufficient documentation

## 2018-11-13 DIAGNOSIS — G43109 Migraine with aura, not intractable, without status migrainosus: Secondary | ICD-10-CM | POA: Diagnosis not present

## 2018-11-13 DIAGNOSIS — E1169 Type 2 diabetes mellitus with other specified complication: Secondary | ICD-10-CM | POA: Insufficient documentation

## 2018-11-13 DIAGNOSIS — E1165 Type 2 diabetes mellitus with hyperglycemia: Secondary | ICD-10-CM | POA: Diagnosis not present

## 2018-11-13 DIAGNOSIS — Z7982 Long term (current) use of aspirin: Secondary | ICD-10-CM | POA: Insufficient documentation

## 2018-11-13 DIAGNOSIS — IMO0002 Reserved for concepts with insufficient information to code with codable children: Secondary | ICD-10-CM

## 2018-11-13 DIAGNOSIS — F17200 Nicotine dependence, unspecified, uncomplicated: Secondary | ICD-10-CM | POA: Insufficient documentation

## 2018-11-13 DIAGNOSIS — M170 Bilateral primary osteoarthritis of knee: Secondary | ICD-10-CM | POA: Diagnosis not present

## 2018-11-13 DIAGNOSIS — E785 Hyperlipidemia, unspecified: Secondary | ICD-10-CM | POA: Insufficient documentation

## 2018-11-13 DIAGNOSIS — E119 Type 2 diabetes mellitus without complications: Secondary | ICD-10-CM

## 2018-11-13 DIAGNOSIS — E118 Type 2 diabetes mellitus with unspecified complications: Secondary | ICD-10-CM | POA: Diagnosis not present

## 2018-11-13 DIAGNOSIS — Z23 Encounter for immunization: Secondary | ICD-10-CM

## 2018-11-13 DIAGNOSIS — Z1211 Encounter for screening for malignant neoplasm of colon: Secondary | ICD-10-CM

## 2018-11-13 DIAGNOSIS — S161XXA Strain of muscle, fascia and tendon at neck level, initial encounter: Secondary | ICD-10-CM

## 2018-11-13 DIAGNOSIS — Z7984 Long term (current) use of oral hypoglycemic drugs: Secondary | ICD-10-CM | POA: Diagnosis not present

## 2018-11-13 DIAGNOSIS — Z79899 Other long term (current) drug therapy: Secondary | ICD-10-CM | POA: Diagnosis not present

## 2018-11-13 DIAGNOSIS — F17210 Nicotine dependence, cigarettes, uncomplicated: Secondary | ICD-10-CM

## 2018-11-13 DIAGNOSIS — I10 Essential (primary) hypertension: Secondary | ICD-10-CM | POA: Diagnosis not present

## 2018-11-13 LAB — POCT GLYCOSYLATED HEMOGLOBIN (HGB A1C): HbA1c, POC (controlled diabetic range): 8.1 % — AB (ref 0.0–7.0)

## 2018-11-13 LAB — GLUCOSE, POCT (MANUAL RESULT ENTRY): POC Glucose: 186 mg/dl — AB (ref 70–99)

## 2018-11-13 MED ORDER — GABAPENTIN 300 MG PO CAPS: 300.0000 mg | ORAL_CAPSULE | Freq: Two times a day (BID) | ORAL | 5 refills | Status: DC

## 2018-11-13 MED ORDER — AMLODIPINE BESYLATE 10 MG PO TABS: 10.0000 mg | ORAL_TABLET | Freq: Every day | ORAL | 6 refills | Status: DC

## 2018-11-13 MED ORDER — JARDIANCE 10 MG PO TABS: 10.0000 mg | ORAL_TABLET | Freq: Every day | ORAL | 6 refills | Status: DC

## 2018-11-13 MED ORDER — METFORMIN HCL 1000 MG PO TABS: ORAL_TABLET | ORAL | 6 refills | Status: DC

## 2018-11-13 MED ORDER — ATORVASTATIN CALCIUM 10 MG PO TABS: 10.0000 mg | ORAL_TABLET | Freq: Every day | ORAL | 6 refills | Status: DC

## 2018-11-13 MED ORDER — SITAGLIPTIN PHOSPHATE 50 MG PO TABS: 50.0000 mg | ORAL_TABLET | Freq: Every day | ORAL | 5 refills | Status: DC

## 2018-11-13 MED ORDER — NICOTINE 21 MG/24HR TD PT24: MEDICATED_PATCH | TRANSDERMAL | 1 refills | Status: DC

## 2018-11-13 NOTE — Patient Instructions (Addendum)
Your diabetes is not as controlled as it should be.  We have added a new medication called Januvia that you will take once a day.  Continue taking your other medications.  Please continue to monitor your blood sugars daily and remember to bring your log in with you on your next visit.  Fall Prevention in the Home, Adult Falls can cause injuries. They can happen to people of all ages. There are many things you can do to make your home safe and to help prevent falls. Ask for help when making these changes, if needed. What actions can I take to prevent falls? General Instructions  Use good lighting in all rooms. Replace any light bulbs that burn out.  Turn on the lights when you go into a dark area. Use night-lights.  Keep items that you use often in easy-to-reach places. Lower the shelves around your home if necessary.  Set up your furniture so you have a clear path. Avoid moving your furniture around.  Do not have throw rugs and other things on the floor that can make you trip.  Avoid walking on wet floors.  If any of your floors are uneven, fix them.  Add color or contrast paint or tape to clearly mark and help you see: ? Any grab bars or handrails. ? First and last steps of stairways. ? Where the edge of each step is.  If you use a stepladder: ? Make sure that it is fully opened. Do not climb a closed stepladder. ? Make sure that both sides of the stepladder are locked into place. ? Ask someone to hold the stepladder for you while you use it.  If there are any pets around you, be aware of where they are. What can I do in the bathroom?      Keep the floor dry. Clean up any water that spills onto the floor as soon as it happens.  Remove soap buildup in the tub or shower regularly.  Use non-skid mats or decals on the floor of the tub or shower.  Attach bath mats securely with double-sided, non-slip rug tape.  If you need to sit down in the shower, use a plastic, non-slip  stool.  Install grab bars by the toilet and in the tub and shower. Do not use towel bars as grab bars. What can I do in the bedroom?  Make sure that you have a light by your bed that is easy to reach.  Do not use any sheets or blankets that are too big for your bed. They should not hang down onto the floor.  Have a firm chair that has side arms. You can use this for support while you get dressed. What can I do in the kitchen?  Clean up any spills right away.  If you need to reach something above you, use a strong step stool that has a grab bar.  Keep electrical cords out of the way.  Do not use floor polish or wax that makes floors slippery. If you must use wax, use non-skid floor wax. What can I do with my stairs?  Do not leave any items on the stairs.  Make sure that you have a light switch at the top of the stairs and the bottom of the stairs. If you do not have them, ask someone to add them for you.  Make sure that there are handrails on both sides of the stairs, and use them. Fix handrails that are broken or  loose. Make sure that handrails are as long as the stairways.  Install non-slip stair treads on all stairs in your home.  Avoid having throw rugs at the top or bottom of the stairs. If you do have throw rugs, attach them to the floor with carpet tape.  Choose a carpet that does not hide the edge of the steps on the stairway.  Check any carpeting to make sure that it is firmly attached to the stairs. Fix any carpet that is loose or worn. What can I do on the outside of my home?  Use bright outdoor lighting.  Regularly fix the edges of walkways and driveways and fix any cracks.  Remove anything that might make you trip as you walk through a door, such as a raised step or threshold.  Trim any bushes or trees on the path to your home.  Regularly check to see if handrails are loose or broken. Make sure that both sides of any steps have handrails.  Install guardrails  along the edges of any raised decks and porches.  Clear walking paths of anything that might make someone trip, such as tools or rocks.  Have any leaves, snow, or ice cleared regularly.  Use sand or salt on walking paths during winter.  Clean up any spills in your garage right away. This includes grease or oil spills. What other actions can I take?  Wear shoes that: ? Have a low heel. Do not wear high heels. ? Have rubber bottoms. ? Are comfortable and fit you well. ? Are closed at the toe. Do not wear open-toe sandals.  Use tools that help you move around (mobility aids) if they are needed. These include: ? Canes. ? Walkers. ? Scooters. ? Crutches.  Review your medicines with your doctor. Some medicines can make you feel dizzy. This can increase your chance of falling. Ask your doctor what other things you can do to help prevent falls. Where to find more information  Centers for Disease Control and Prevention, [employer]AD[employer]: https://garcia.biz/  [employer] on Aging: BrainJudge.co.uk Contact a doctor if:  You are afraid of falling at home.  You feel weak, drowsy, or dizzy at home.  You fall at home. Summary  There are many simple things that you can do to make your home safe and to help prevent falls.  Ways to make your home safe include removing tripping hazards and installing grab bars in the bathroom.  Ask for help when making these changes in your home. This information is not intended to replace advice given to you by your health care provider. Make sure you discuss any questions you have with your health care provider. Document Released: 11/14/2008 Document Revised: 05/11/2018 Document Reviewed: 09/02/2016 Elsevier Patient Education  [place].   Influenza Virus Vaccine injection (Fluarix) What is this medicine? INFLUENZA VIRUS VACCINE (in floo EN zuh VAHY ruhs vak SEEN) helps to reduce the risk of getting influenza also known as the flu.  This medicine may be used for other purposes; ask your health care provider or pharmacist if you have questions. COMMON BRAND NAME(S): Fluarix, Fluzone What should I tell my health care provider before I take this medicine? They need to know if you have any of these conditions:  bleeding disorder like hemophilia  fever or infection  Guillain-Barre syndrome or other neurological problems  immune system problems  infection with the human immunodeficiency virus (HIV) or AIDS  low blood platelet counts  multiple sclerosis  an unusual  or allergic reaction to influenza virus vaccine, eggs, chicken proteins, latex, gentamicin, other medicines, foods, dyes or preservatives  pregnant or trying to get pregnant  breast-feeding How should I use this medicine? This vaccine is for injection into a muscle. It is given by a health care professional. A copy of Vaccine Information Statements will be given before each vaccination. Read this sheet carefully each time. The sheet may change frequently. Talk to your pediatrician regarding the use of this medicine in children. Special care may be needed. Overdosage: If you think you have taken too much of this medicine contact a poison control center or emergency room at once. NOTE: This medicine is only for you. Do not share this medicine with others. What if I miss a dose? This does not apply. What may interact with this medicine?  chemotherapy or radiation therapy  medicines that lower your immune system like etanercept, anakinra, infliximab, and adalimumab  medicines that treat or prevent blood clots like warfarin  phenytoin  steroid medicines like prednisone or cortisone  theophylline  vaccines This list may not describe all possible interactions. Give your health care provider a list of all the medicines, herbs, non-prescription drugs, or dietary supplements you use. Also tell them if you smoke, drink alcohol, or use illegal drugs. Some  items may interact with your medicine. What should I watch for while using this medicine? Report any side effects that do not go away within 3 days to your doctor or health care professional. Call your health care provider if any unusual symptoms occur within 6 weeks of receiving this vaccine. You may still catch the flu, but the illness is not usually as bad. You cannot get the flu from the vaccine. The vaccine will not protect against colds or other illnesses that may cause fever. The vaccine is needed every year. What side effects may I notice from receiving this medicine? Side effects that you should report to your doctor or health care professional as soon as possible:  allergic reactions like skin rash, itching or hives, swelling of the face, lips, or tongue Side effects that usually do not require medical attention (report to your doctor or health care professional if they continue or are bothersome):  fever  headache  muscle aches and pains  pain, tenderness, redness, or swelling at site where injected  weak or tired This list may not describe all possible side effects. Call your doctor for medical advice about side effects. You may report side effects to FDA at 1-800-FDA-1088. Where should I keep my medicine? This vaccine is only given in a clinic, pharmacy, doctor's office, or other health care setting and will not be stored at home. NOTE: This sheet is a summary. It may not cover all possible information. If you have questions about this medicine, talk to your doctor, pharmacist, or health care provider.  2020 Elsevier/Gold Standard (2007-08-16 09:30:40)

## 2018-11-13 NOTE — Progress Notes (Signed)
Patient presents for vaccination against influenza per orders of Dr. Johnson. Consent given. Counseling provided. No contraindications exists. Vaccine administered without incident.   

## 2018-11-13 NOTE — Progress Notes (Signed)
Patient ID: April Mcmahon, female    DOB: December 05, 1960  MRN: [id number]  CC: Hypertension and Diabetes   Subjective: April Mcmahon is a 58 y.o. female who presents for chronic ds management.  Daughter-in-law, Helayne Seminole, is with her and interprets Her concerns today include:  Pt with hx of DMneuropathy, HTN, HL, tob dep, migraines, Vit D def, DJD knees and plantar fasciitis, Abn LFT with steatosis on U/S.  DIABETES TYPE 2 Last A1C:   Results for orders placed or performed in visit on 11/13/18  POCT glucose (manual entry)  Result Value Ref Range   POC Glucose 186 (A) 70 - 99 mg/dl  POCT glycosylated hemoglobin (Hb A1C)  Result Value Ref Range   Hemoglobin A1C     HbA1c POC (<> result, manual entry)     HbA1c, POC (prediabetic range)     HbA1c, POC (controlled diabetic range) 8.1 (A) 0.0 - 7.0 %    Med Adherence:  [x]  Yes -on last visit we had decrease Amaryl to 1 mg daily because of frequent hypoglycemia.  She takes the Amaryl, metformin and Jardiance.[]  No Medication side effects:  []  Yes    [x]  No Home Monitoring?  [x]  Yes  -TID before meals.  Forgot to bring log  []  No Home glucose results range: a.m range 120-140, rest of day in the 180s Diet Adherence: [x]  Yes    []  No Exercise: [x]  Yes   -she walks outside around the home every day for 1/2 hr  Hypoglycemic episodes?: [x]  Yes -1-2 x wk, less than on last visi   []  No Numbness of the feet? []  Yes    [x]  No Retinopathy hx? []  Yes    [x]  No Last eye exam:  Comments:  Fx of LT big toe: reportedly saw ortho.  Told no further management needed  Tob dep:  Used the patches.  Quit 1 mth ago.  Request RF and stronger dose than the 14 mg because she still gets cravings.  HYPERTENSION Currently taking: see medication list Med Adherence: [x]  Yes    []  No Medication side effects: []  Yes    [x]  No Adherence with salt restriction: [x]  Yes    []  No Home Monitoring?: []  Yes    [x]  No Monitoring Frequency: []  Yes    []  No Home BP  results range: []  Yes    []  No SOB? []  Yes    [x]  No Chest Pain?: []  Yes    [x]  No Leg swelling?: []  Yes    [x]  No Headaches?: []  Yes    [x]  No Dizziness? []  Yes    [x]  No Comments:    Patient Active Problem List   Diagnosis Date Noted  . Muscle cramps 02/21/2018  . Hepatic steatosis 12/23/2017  . MCI (mild cognitive impairment) 02/14/2017  . Diabetic peripheral neuropathy ([place]) 02/14/2017  . Memory loss 12/27/2016  . Dysfunctional uterine bleeding 08/30/2016  . Analgesic rebound headache 06/24/2016  . Esophageal dysphagia 06/21/2016  . Tobacco dependence 01/20/2016  . Plantar fasciitis, bilateral 12/24/2015  . 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 Diabetes type 2, controlled ([place]) 06/19/2015  . Essential hypertension 06/19/2015  . Vitamin D deficiency 09/24/2013  . Hyperlipidemia 09/24/2013  . Migraine 05/15/2013  . Low back pain 08/18/2012     Current Outpatient Medications on File Prior to Visit  Medication Sig Dispense Refill  . ACCU-CHEK SOFTCLIX LANCETS lancets Use as instructed 100 each 12  . aspirin 325 MG tablet Take 650  mg by mouth every 6 (six) hours as needed. For headache    . fluticasone (FLONASE) 50 MCG/ACT nasal spray Place 1 spray into both nostrils daily. 16 g 2  . glimepiride (AMARYL) 2 MG tablet Take 1 tablet (2 mg total) by mouth daily before breakfast. 30 tablet 2  . glucose blood (ACCU-CHEK AVIVA) test strip Use as instructed 100 each 12  . meloxicam (MOBIC) 15 MG tablet TAKE ONE TABLET BY MOUTH EVERY DAY 30 tablet 2  . methocarbamol (ROBAXIN) 500 MG tablet Take 1 tablet (500 mg total) by mouth 3 (three) times daily. X 5 days then prn muscle spasm 60 tablet 0  . nicotine polacrilex (NICORETTE) 2 MG gum Take 1 each (2 mg total) by mouth as needed for smoking cessation. 100 tablet 1  . SUMAtriptan (IMITREX) 100 MG tablet Take 1 tablet (100 mg total) by mouth as needed for migraine. May repeat in 2 hours if headache persists or recurs.   Do not refill in less than 30 days 10 tablet 6  . venlafaxine (EFFEXOR) 37.5 MG tablet Take 1 tablet (37.5 mg total) by mouth 2 (two) times daily with a meal. 60 tablet 11  . VOLTAREN 1 % GEL APPLY TWO GRAMS EXTERNALLY TO AFFECTED AREA FOUR TIMES DAILY 100 g 3   No current facility-administered medications on file prior to visit.     Allergies  Allergen Reactions  . Elavil [Amitriptyline Hcl] Diarrhea and Nausea And Vomiting    Social History   Socioeconomic History  . Marital status: Married    Spouse name: Not on file  . Number of children: Not on file  . Years of education: Not on file  . Highest education level: Not on file  Occupational History  . Not on file  Social Needs  . Financial resource strain: Not on file  . Food insecurity    Worry: Not on file    Inability: Not on file  . Transportation needs    Medical: Not on file    Non-medical: Not on file  Tobacco Use  . Smoking status: Current Some Day Smoker  . Smokeless tobacco: Never Used  Substance and Sexual Activity  . Alcohol use: No  . Drug use: No  . Sexual activity: Not on file  Lifestyle  . Physical activity    Days per week: Not on file    Minutes per session: Not on file  . Stress: Not on file  Relationships  . Social Herbalist on phone: Not on file    Gets together: Not on file    Attends religious service: Not on file    Active member of club or organization: Not on file    Attends meetings of clubs or organizations: Not on file    Relationship status: Not on file  . Intimate partner violence    Fear of current or ex partner: Not on file    Emotionally abused: Not on file    Physically abused: Not on file    Forced sexual activity: Not on file  Other Topics Concern  . Not on file  Social History Narrative  . Not on file    Family History  Problem Relation Age of Onset  . Migraines Mother   . Breast cancer Neg Hx     Past Surgical History:  Procedure Laterality Date   . no previous surgeries      ROS: Review of Systems Negative except as stated above  PHYSICAL  EXAM: BP 126/78   Pulse 79   Temp 97.7 F (36.5 C) (Oral)   Resp 16   Wt 153 lb (69.4 kg)   LMP 10/24/2016   SpO2 97%   BMI 30.90 kg/m   Wt Readings from Last 3 Encounters:  11/13/18 153 lb (69.4 kg)  08/11/18 154 lb (69.9 kg)  02/21/18 146 lb 9.6 oz (66.5 kg)    Physical Exam  General appearance - alert, well appearing, and in no distress Mental status - normal mood, behavior, speech, dress, motor activity, and thought processes Mouth - mucous membranes moist, pharynx normal without lesions Neck - supple, no significant adenopathy Chest - clear to auscultation, no wheezes, rales or rhonchi, symmetric air entry Heart - normal rate, regular rhythm, normal S1, S2, no murmurs, rubs, clicks or gallops Extremities - peripheral pulses normal, no pedal edema, no clubbing or cyanosis Diabetic Foot Exam - Simple   Simple Foot Form Visual Inspection No deformities, no ulcerations, no other skin breakdown bilaterally: Yes Sensation Testing Intact to touch and monofilament testing bilaterally: Yes Pulse Check Posterior Tibialis and Dorsalis pulse intact bilaterally: Yes Comments     Depression screen [facility] 2/9 11/13/2018 08/11/2018 02/21/2018  Decreased Interest 1 0 1  Down, Depressed, Hopeless 0 0 0  PHQ - 2 Score 1 0 1  Altered sleeping - - -  Tired, decreased energy - - -  Change in appetite - - -  Feeling bad or failure about yourself  - - -  Trouble concentrating - - -  Moving slowly or fidgety/restless - - -  Suicidal thoughts - - -  PHQ-9 Score - - -  Some recent data might be hidden     CMP Latest Ref Rng & Units 03/07/2018 12/23/2017 10/10/2017  Glucose 65 - 99 mg/dL 94 - 90  BUN 6 - 24 mg/dL 10 - 10  Creatinine 0.57 - 1.00 mg/dL 0.67 - 0.57  Sodium 134 - 144 mmol/L 140 - 138  Potassium 3.5 - 5.2 mmol/L 4.7 - 3.7  Chloride 96 - 106 mmol/L 103 - 106  CO2 20 - 29  mmol/L 22 - 20(L)  Calcium 8.7 - 10.2 mg/dL 9.6 - 9.2  Total Protein 6.0 - 8.5 g/dL 7.6 7.2 -  Total Bilirubin 0.0 - 1.2 mg/dL 0.5 0.3 -  Alkaline Phos 39 - 117 IU/L 139(H) 137(H) -  AST 0 - 40 IU/L 28 55(H) -  ALT 0 - 32 IU/L 36(H) 73(H) -   Lipid Panel     Component Value Date/Time   CHOL 198 12/26/2017 1149   TRIG 235 (H) 12/26/2017 1149   HDL 47 12/26/2017 1149   CHOLHDL 4.2 12/26/2017 1149   CHOLHDL 3.3 10/20/2015 1625   VLDL 46 (H) 10/20/2015 1625   LDLCALC 104 (H) 12/26/2017 1149    CBC    Component Value Date/Time   WBC 9.7 10/10/2017 1941   RBC 4.81 10/10/2017 1941   HGB 12.7 10/10/2017 1941   HGB 12.0 06/21/2017 1617   HCT 41.0 10/10/2017 1941   HCT 37.4 06/21/2017 1617   PLT 220 10/10/2017 1941   PLT 255 06/21/2017 1617   MCV 85.2 10/10/2017 1941   MCV 84 06/21/2017 1617   MCH 26.4 10/10/2017 1941   MCHC 31.0 10/10/2017 1941   RDW 13.5 10/10/2017 1941   RDW 14.0 06/21/2017 1617   LYMPHSABS 2,948 10/20/2015 1625   MONOABS 335 10/20/2015 1625   EOSABS 268 10/20/2015 1625   BASOSABS 0 10/20/2015 1625  ASSESSMENT AND PLAN: 1. Uncontrolled type 2 diabetes mellitus with peripheral neuropathy ([place]) Commended her on healthy eating habits and trying to move daily.  Our goal is to keep her A1c between 7 and 8 because of hypoglycemia she had in the past causing her to have a fall and break her toe.  Her A1c is about 8.  I recommend adding low-dose Januvia. - POCT glucose (manual entry) - POCT glycosylated hemoglobin (Hb A1C) - Microalbumin / creatinine urine ratio - CBC - Comprehensive metabolic panel - Lipid panel - empagliflozin (JARDIANCE) 10 MG TABS tablet; Take 10 mg by mouth daily.  Dispense: 30 tablet; Refill: 6 - gabapentin (NEURONTIN) 300 MG capsule; Take 1 capsule (300 mg total) by mouth 2 (two) times daily.  Dispense: 60 capsule; Refill: 5 - metFORMIN (GLUCOPHAGE) 1000 MG tablet; TAKE ONE TABLET BY MOUTH TWICE DAILY WITH MEAL  Dispense: 60 tablet;  Refill: 6 - sitaGLIPtin (JANUVIA) 50 MG tablet; Take 1 tablet (50 mg total) by mouth daily.  Dispense: 30 tablet; Refill: 5  2. Essential hypertension At goal.  Continue current medications - amLODipine (NORVASC) 10 MG tablet; Take 1 tablet (10 mg total) by mouth daily.  Dispense: 30 tablet; Refill: 6  3. Hyperlipidemia associated with type 2 diabetes mellitus (HCC) - atorvastatin (LIPITOR) 10 MG tablet; Take 1 tablet (10 mg total) by mouth daily.  Dispense: 30 tablet; Refill: 6  4. Tobacco dependence Commended her on quitting.  Prescription sent to the pharmacy for the 21 mg nicotine patch.  Encouraged her to remain tobacco free.  Less than 5 minutes spent on counseling - nicotine (NICODERM CQ - DOSED IN MG/24 HOURS) 21 mg/24hr patch; UNWRAP AND APPLY 1 PATCH TO SKIN DAILY  Dispense: 30 patch; Refill: 1  5. Need for influenza vaccination Given  6. Screening for colon cancer Discussed colon cancer screening.  She is agreeable for referral for colonoscopy. - Ambulatory referral to Gastroenterology     Patient was given the opportunity to ask questions.  Patient verbalized understanding of the plan and was able to repeat key elements of the plan.   Orders Placed This Encounter  Procedures  . Microalbumin / creatinine urine ratio  . CBC  . Comprehensive metabolic panel  . Lipid panel  . Ambulatory referral to Gastroenterology  . POCT glucose (manual entry)  . POCT glycosylated hemoglobin (Hb A1C)     Requested Prescriptions   Signed Prescriptions Disp Refills  . amLODipine (NORVASC) 10 MG tablet 30 tablet 6    Sig: Take 1 tablet (10 mg total) by mouth daily.  . empagliflozin (JARDIANCE) 10 MG TABS tablet 30 tablet 6    Sig: Take 10 mg by mouth daily.  Marland Kitchen gabapentin (NEURONTIN) 300 MG capsule 60 capsule 5    Sig: Take 1 capsule (300 mg total) by mouth 2 (two) times daily.  Marland Kitchen atorvastatin (LIPITOR) 10 MG tablet 30 tablet 6    Sig: Take 1 tablet (10 mg total) by mouth daily.   . metFORMIN (GLUCOPHAGE) 1000 MG tablet 60 tablet 6    Sig: TAKE ONE TABLET BY MOUTH TWICE DAILY WITH MEAL  . nicotine (NICODERM CQ - DOSED IN MG/24 HOURS) 21 mg/24hr patch 30 patch 1    Sig: UNWRAP AND APPLY 1 PATCH TO SKIN DAILY  . sitaGLIPtin (JANUVIA) 50 MG tablet 30 tablet 5    Sig: Take 1 tablet (50 mg total) by mouth daily.    Return in about 3 months (around 02/13/2019).  Karle Plumber, MD,  FACP

## 2018-11-14 ENCOUNTER — Encounter: Payer: Self-pay | Admitting: Gastroenterology

## 2018-11-14 DIAGNOSIS — G43109 Migraine with aura, not intractable, without status migrainosus: Secondary | ICD-10-CM | POA: Diagnosis not present

## 2018-11-14 LAB — LIPID PANEL
Chol/HDL Ratio: 2.9 ratio (ref 0.0–4.4)
Cholesterol, Total: 165 mg/dL (ref 100–199)
HDL: 57 mg/dL (ref >39)
LDL Chol Calc (NIH): 74 mg/dL (ref 0–99)
Triglycerides: 204 mg/dL — ABNORMAL HIGH (ref 0–149)
VLDL Cholesterol Cal: 34 mg/dL (ref 5–40)

## 2018-11-14 LAB — COMPREHENSIVE METABOLIC PANEL
ALT: 43 IU/L — ABNORMAL HIGH (ref 0–32)
AST: 31 IU/L (ref 0–40)
Albumin/Globulin Ratio: 1.9 (ref 1.2–2.2)
Albumin: 5.2 g/dL — ABNORMAL HIGH (ref 3.8–4.9)
Alkaline Phosphatase: 181 IU/L — ABNORMAL HIGH (ref 39–117)
BUN/Creatinine Ratio: 14 (ref 9–23)
BUN: 10 mg/dL (ref 6–24)
Bilirubin Total: 0.6 mg/dL (ref 0.0–1.2)
CO2: 23 mmol/L (ref 20–29)
Calcium: 9.9 mg/dL (ref 8.7–10.2)
Chloride: 102 mmol/L (ref 96–106)
Creatinine, Ser: 0.7 mg/dL (ref 0.57–1.00)
GFR calc Af Amer: 112 mL/min/{1.73_m2} (ref >59)
GFR calc non Af Amer: 97 mL/min/{1.73_m2} (ref >59)
Globulin, Total: 2.7 g/dL (ref 1.5–4.5)
Glucose: 154 mg/dL — ABNORMAL HIGH (ref 65–99)
Potassium: 4.5 mmol/L (ref 3.5–5.2)
Sodium: 140 mmol/L (ref 134–144)
Total Protein: 7.9 g/dL (ref 6.0–8.5)

## 2018-11-14 LAB — MICROALBUMIN / CREATININE URINE RATIO
Creatinine, Urine: 14.7 mg/dL
Microalb/Creat Ratio: 161 mg/g creat — ABNORMAL HIGH (ref 0–29)
Microalbumin, Urine: 23.6 ug/mL

## 2018-11-14 LAB — CBC
Hematocrit: 44.3 % (ref 34.0–46.6)
Hemoglobin: 14.3 g/dL (ref 11.1–15.9)
MCH: 26.7 pg (ref 26.6–33.0)
MCHC: 32.3 g/dL (ref 31.5–35.7)
MCV: 83 fL (ref 79–97)
Platelets: 235 10*3/uL (ref 150–450)
RBC: 5.35 x10E6/uL — ABNORMAL HIGH (ref 3.77–5.28)
RDW: 13.2 % (ref 11.7–15.4)
WBC: 7.9 10*3/uL (ref 3.4–10.8)

## 2018-11-15 ENCOUNTER — Other Ambulatory Visit: Payer: Self-pay | Admitting: Internal Medicine

## 2018-11-15 DIAGNOSIS — R809 Proteinuria, unspecified: Secondary | ICD-10-CM | POA: Insufficient documentation

## 2018-11-15 DIAGNOSIS — R748 Abnormal levels of other serum enzymes: Secondary | ICD-10-CM

## 2018-11-15 DIAGNOSIS — G43109 Migraine with aura, not intractable, without status migrainosus: Secondary | ICD-10-CM | POA: Diagnosis not present

## 2018-11-15 MED ORDER — LISINOPRIL 2.5 MG PO TABS: 5.0000 mg | ORAL_TABLET | Freq: Every day | ORAL | 1 refills | Status: DC

## 2018-11-16 DIAGNOSIS — G43109 Migraine with aura, not intractable, without status migrainosus: Secondary | ICD-10-CM | POA: Diagnosis not present

## 2018-11-17 DIAGNOSIS — G43109 Migraine with aura, not intractable, without status migrainosus: Secondary | ICD-10-CM | POA: Diagnosis not present

## 2018-11-18 DIAGNOSIS — G43109 Migraine with aura, not intractable, without status migrainosus: Secondary | ICD-10-CM | POA: Diagnosis not present

## 2018-11-19 DIAGNOSIS — G43109 Migraine with aura, not intractable, without status migrainosus: Secondary | ICD-10-CM | POA: Diagnosis not present

## 2018-11-20 DIAGNOSIS — G43109 Migraine with aura, not intractable, without status migrainosus: Secondary | ICD-10-CM | POA: Diagnosis not present

## 2018-11-21 DIAGNOSIS — G43109 Migraine with aura, not intractable, without status migrainosus: Secondary | ICD-10-CM | POA: Diagnosis not present

## 2018-11-22 ENCOUNTER — Telehealth: Payer: Self-pay

## 2018-11-22 DIAGNOSIS — G43109 Migraine with aura, not intractable, without status migrainosus: Secondary | ICD-10-CM | POA: Diagnosis not present

## 2018-11-22 NOTE — Telephone Encounter (Signed)
Contacted pt to go over lab results pt is aware and doesn't have any questions or concerns 

## 2018-11-23 DIAGNOSIS — G43109 Migraine with aura, not intractable, without status migrainosus: Secondary | ICD-10-CM | POA: Diagnosis not present

## 2018-11-24 ENCOUNTER — Other Ambulatory Visit: Payer: Self-pay

## 2018-11-24 ENCOUNTER — Ambulatory Visit: Payer: Medicaid Other | Attending: Internal Medicine

## 2018-11-24 DIAGNOSIS — R748 Abnormal levels of other serum enzymes: Secondary | ICD-10-CM

## 2018-11-24 DIAGNOSIS — G43109 Migraine with aura, not intractable, without status migrainosus: Secondary | ICD-10-CM | POA: Diagnosis not present

## 2018-11-25 DIAGNOSIS — G43109 Migraine with aura, not intractable, without status migrainosus: Secondary | ICD-10-CM | POA: Diagnosis not present

## 2018-11-26 DIAGNOSIS — G43109 Migraine with aura, not intractable, without status migrainosus: Secondary | ICD-10-CM | POA: Diagnosis not present

## 2018-11-27 DIAGNOSIS — G43109 Migraine with aura, not intractable, without status migrainosus: Secondary | ICD-10-CM | POA: Diagnosis not present

## 2018-11-28 ENCOUNTER — Other Ambulatory Visit: Payer: Self-pay | Admitting: Internal Medicine

## 2018-11-28 DIAGNOSIS — G43109 Migraine with aura, not intractable, without status migrainosus: Secondary | ICD-10-CM | POA: Diagnosis not present

## 2018-11-28 LAB — MITOCHONDRIAL ANTIBODIES: Mitochondrial Ab: <20 Units (ref 0.0–20.0)

## 2018-11-29 ENCOUNTER — Ambulatory Visit (AMBULATORY_SURGERY_CENTER): Payer: Medicaid Other | Admitting: *Deleted

## 2018-11-29 ENCOUNTER — Other Ambulatory Visit: Payer: Self-pay

## 2018-11-29 VITALS — Temp 96.8°F | Ht 61.0 in | Wt 150.4 lb

## 2018-11-29 DIAGNOSIS — Z1159 Encounter for screening for other viral diseases: Secondary | ICD-10-CM

## 2018-11-29 DIAGNOSIS — G43109 Migraine with aura, not intractable, without status migrainosus: Secondary | ICD-10-CM | POA: Diagnosis not present

## 2018-11-29 DIAGNOSIS — Z1211 Encounter for screening for malignant neoplasm of colon: Secondary | ICD-10-CM

## 2018-11-29 MED ORDER — SUPREP BOWEL PREP KIT 17.5-3.13-1.6 GM/177ML PO SOLN: ORAL | 0 refills | Status: DC

## 2018-11-29 NOTE — Progress Notes (Signed)
Pt is aware that care partner will wait in the car during procedure; if they feel like they will be too hot or cold to wait in the car; they may wait in the 4 th floor lobby. Patient is aware to bring only one care partner. We want them to wear a mask (we do not have any that we can provide them), practice social distancing, and we will check their temperatures when they get here.  I did remind the patient that their care partner needs to stay in the parking lot the entire time and have a cell phone available, we will call them when the pt is ready for discharge. Patient will wear mask into building.   Pt's daughter in law today to interpret- release form signed.  Covid test scheduled for 12-07-18 at 1145.  No egg or soy allergy  No trouble moving neck  No diet medications taken

## 2018-11-30 DIAGNOSIS — G43109 Migraine with aura, not intractable, without status migrainosus: Secondary | ICD-10-CM | POA: Diagnosis not present

## 2018-12-01 DIAGNOSIS — G43109 Migraine with aura, not intractable, without status migrainosus: Secondary | ICD-10-CM | POA: Diagnosis not present

## 2018-12-02 DIAGNOSIS — G43109 Migraine with aura, not intractable, without status migrainosus: Secondary | ICD-10-CM | POA: Diagnosis not present

## 2018-12-03 DIAGNOSIS — G43109 Migraine with aura, not intractable, without status migrainosus: Secondary | ICD-10-CM | POA: Diagnosis not present

## 2018-12-04 DIAGNOSIS — G43109 Migraine with aura, not intractable, without status migrainosus: Secondary | ICD-10-CM | POA: Diagnosis not present

## 2018-12-05 DIAGNOSIS — G43109 Migraine with aura, not intractable, without status migrainosus: Secondary | ICD-10-CM | POA: Diagnosis not present

## 2018-12-06 DIAGNOSIS — G43109 Migraine with aura, not intractable, without status migrainosus: Secondary | ICD-10-CM | POA: Diagnosis not present

## 2018-12-07 ENCOUNTER — Other Ambulatory Visit: Payer: Self-pay

## 2018-12-07 DIAGNOSIS — G43109 Migraine with aura, not intractable, without status migrainosus: Secondary | ICD-10-CM | POA: Diagnosis not present

## 2018-12-07 DIAGNOSIS — Z20822 Contact with and (suspected) exposure to covid-19: Secondary | ICD-10-CM

## 2018-12-08 DIAGNOSIS — G43109 Migraine with aura, not intractable, without status migrainosus: Secondary | ICD-10-CM | POA: Diagnosis not present

## 2018-12-09 DIAGNOSIS — G43109 Migraine with aura, not intractable, without status migrainosus: Secondary | ICD-10-CM | POA: Diagnosis not present

## 2018-12-09 LAB — NOVEL CORONAVIRUS, NAA: SARS-CoV-2, NAA: NOT DETECTED

## 2018-12-10 DIAGNOSIS — G43109 Migraine with aura, not intractable, without status migrainosus: Secondary | ICD-10-CM | POA: Diagnosis not present

## 2018-12-11 ENCOUNTER — Other Ambulatory Visit: Payer: Self-pay | Admitting: Internal Medicine

## 2018-12-11 DIAGNOSIS — IMO0002 Reserved for concepts with insufficient information to code with codable children: Secondary | ICD-10-CM

## 2018-12-11 DIAGNOSIS — G43109 Migraine with aura, not intractable, without status migrainosus: Secondary | ICD-10-CM | POA: Diagnosis not present

## 2018-12-11 DIAGNOSIS — F17200 Nicotine dependence, unspecified, uncomplicated: Secondary | ICD-10-CM

## 2018-12-11 DIAGNOSIS — E1142 Type 2 diabetes mellitus with diabetic polyneuropathy: Secondary | ICD-10-CM

## 2018-12-12 ENCOUNTER — Ambulatory Visit (AMBULATORY_SURGERY_CENTER): Payer: Medicaid Other | Admitting: Gastroenterology

## 2018-12-12 ENCOUNTER — Other Ambulatory Visit: Payer: Self-pay

## 2018-12-12 ENCOUNTER — Encounter: Payer: Self-pay | Admitting: Gastroenterology

## 2018-12-12 VITALS — BP 119/61 | HR 61 | Temp 98.2°F | Resp 14 | Ht 59.0 in | Wt 150.4 lb

## 2018-12-12 DIAGNOSIS — Z1211 Encounter for screening for malignant neoplasm of colon: Secondary | ICD-10-CM

## 2018-12-12 DIAGNOSIS — D120 Benign neoplasm of cecum: Secondary | ICD-10-CM | POA: Diagnosis not present

## 2018-12-12 DIAGNOSIS — G43109 Migraine with aura, not intractable, without status migrainosus: Secondary | ICD-10-CM | POA: Diagnosis not present

## 2018-12-12 MED ORDER — SODIUM CHLORIDE 0.9 % IV SOLN: 500.0000 mL | Freq: Once | INTRAVENOUS | Status: DC

## 2018-12-12 NOTE — Patient Instructions (Signed)
YOU HAD AN ENDOSCOPIC PROCEDURE TODAY AT THE [facility]:   Refer to the procedure report that was given to you for any specific questions about what was found during the examination.  If the procedure report does not answer your questions, please call your gastroenterologist to clarify.  If you requested that your care partner not be given the details of your procedure findings, then the procedure report has been included in a sealed envelope for you to review at your convenience later.  YOU SHOULD EXPECT: Some feelings of bloating in the abdomen. Passage of more gas than usual.  Walking can help get rid of the air that was put into your GI tract during the procedure and reduce the bloating. If you had a lower endoscopy (such as a colonoscopy or flexible sigmoidoscopy) you may notice spotting of blood in your stool or on the toilet paper. If you underwent a bowel prep for your procedure, you may not have a normal bowel movement for a few days.  Please Note:  You might notice some irritation and congestion in your nose or some drainage.  This is from the oxygen used during your procedure.  There is no need for concern and it should clear up in a day or so.  SYMPTOMS TO REPORT IMMEDIATELY:   Following lower endoscopy (colonoscopy or flexible sigmoidoscopy):  Excessive amounts of blood in the stool  Significant tenderness or worsening of abdominal pains  Swelling of the abdomen that is new, acute  Fever of 100F or higher  For urgent or emergent issues, a gastroenterologist can be reached at any hour by calling [phone].   DIET:  We do recommend a small meal at first, but then you may proceed to your regular diet.  Drink plenty of fluids but you should avoid alcoholic beverages for 24 hours.  ACTIVITY:  You should plan to take it easy for the rest of today and you should NOT DRIVE or use heavy machinery until tomorrow (because of the sedation medicines used during the test).     FOLLOW UP: Our staff will call the number listed on your records 48-72 hours following your procedure to check on you and address any questions or concerns that you may have regarding the information given to you following your procedure. If we do not reach you, we will leave a message.  We will attempt to reach you two times.  During this call, we will ask if you have developed any symptoms of COVID 19. If you develop any symptoms (ie: fever, flu-like symptoms, shortness of breath, cough etc.) before then, please call [phone].  If you test positive for Covid 19 in the 2 weeks post procedure, please call and report this information to us.    If any biopsies were taken you will be contacted by phone or by letter within the next 1-3 weeks.  Please call us at [phone] if you have not heard about the biopsies in 3 weeks.    SIGNATURES/CONFIDENTIALITY: You and/or your care partner have signed paperwork which will be entered into your electronic medical record.  These signatures attest to the fact that that the information above on your After Visit Summary has been reviewed and is understood.  Full responsibility of the confidentiality of this discharge information lies with you and/or your care-partner. 

## 2018-12-12 NOTE — Progress Notes (Signed)
Temperature- [facility] VS_Courtney [place] used today at the [employer] [facility] for this pt.  Interpreter's name is- Arti C.  Pt's states no medical or surgical changes since previsit or office visit.

## 2018-12-12 NOTE — Progress Notes (Signed)
Interpreter used today at the [facility] for this pt.  Interpreter's name is-Arti. C.

## 2018-12-12 NOTE — Progress Notes (Signed)
Called to room to assist during endoscopic procedure.  Patient ID and intended procedure confirmed with present staff. Received instructions for my participation in the procedure from the performing physician.  

## 2018-12-12 NOTE — Op Note (Signed)
[facility] Patient Name: Vantasia Signs Procedure Date: 12/12/2018 7:32 AM MRN: [id number] Endoscopist: Mauri Pole , MD Age: 58 Referring MD:  Date of Birth: 12/21/1960 Gender: Female Account #: [id number] Procedure:                Colonoscopy Indications:              Screening for colorectal malignant neoplasm Medicines:                Monitored Anesthesia Care Procedure:                Pre-Anesthesia Assessment:                           - Prior to the procedure, a History and Physical                            was performed, and patient medications and                            allergies were reviewed. The patient's tolerance of                            previous anesthesia was also reviewed. The risks                            and benefits of the procedure and the sedation                            options and risks were discussed with the patient.                            All questions were answered, and informed consent                            was obtained. Prior Anticoagulants: The patient has                            taken no previous anticoagulant or antiplatelet                            agents. ASA Grade Assessment: II - A patient with                            mild systemic disease. After reviewing the risks                            and benefits, the patient was deemed in                            satisfactory condition to undergo the procedure.                           After obtaining informed consent, the colonoscope  was passed under direct vision. Throughout the                            procedure, the patient's blood pressure, pulse, and                            oxygen saturations were monitored continuously. The                            Colonoscope was introduced through the anus and                            advanced to the the cecum, identified by                            appendiceal orifice and  ileocecal valve. The                            colonoscopy was technically difficult and complex                            due to poor bowel prep. Successful completion of                            the procedure was aided by lavage. The patient                            tolerated the procedure well. The quality of the                            bowel preparation was inadequate. The ileocecal                            valve, appendiceal orifice, and rectum were                            photographed. Scope In: 8:16:54 AM Scope Out: 8:27:56 AM Scope Withdrawal Time: 0 hours 7 minutes 39 seconds  Total Procedure Duration: 0 hours 11 minutes 2 seconds  Findings:                 The perianal and digital rectal examinations were                            normal.                           A less than 1 mm polyp was found in the cecum. The                            polyp was sessile. The polyp was removed with a                            cold biopsy forceps. Resection and retrieval were  complete.                           A large amount of stool was found in the entire                            colon, interfering with visualization. Lavage of                            the area was performed using a large amount,                            resulting in incomplete clearance with fair                            visualization. Complications:            No immediate complications. Estimated Blood Loss:     Estimated blood loss was minimal. Impression:               - Preparation of the colon was inadequate.                           - One less than 1 mm polyp in the cecum, removed                            with a cold biopsy forceps. Resected and retrieved.                           - Stool in the entire examined colon. Recommendation:           - Patient has a contact number available for                            emergencies. The signs and symptoms of  potential                            delayed complications were discussed with the                            patient. Return to normal activities tomorrow.                            Written discharge instructions were provided to the                            patient.                           - Resume previous diet.                           - Continue present medications.                           - Await pathology results.                           -  Repeat colonoscopy at the next available                            appointment because the bowel preparation was                            suboptimal. Mauri Pole, MD 12/12/2018 8:37:38 AM This report has been signed electronically.

## 2018-12-12 NOTE — Progress Notes (Signed)
Pt tolerated well. VSS. Arousable to recovery.

## 2018-12-13 ENCOUNTER — Ambulatory Visit (HOSPITAL_BASED_OUTPATIENT_CLINIC_OR_DEPARTMENT_OTHER): Payer: Medicaid Other | Admitting: Physician Assistant

## 2018-12-13 ENCOUNTER — Ambulatory Visit (HOSPITAL_COMMUNITY)
Admission: RE | Admit: 2018-12-13 | Discharge: 2018-12-13 | Disposition: A | Discharge status: Home / Self Care | Payer: Medicaid Other | Source: Ambulatory Visit | Attending: Physician Assistant | Admitting: Physician Assistant

## 2018-12-13 ENCOUNTER — Other Ambulatory Visit: Payer: Self-pay | Admitting: Physician Assistant

## 2018-12-13 DIAGNOSIS — S40021A Contusion of right upper arm, initial encounter: Secondary | ICD-10-CM

## 2018-12-13 DIAGNOSIS — W1812XA Fall from or off toilet with subsequent striking against object, initial encounter: Secondary | ICD-10-CM | POA: Diagnosis not present

## 2018-12-13 DIAGNOSIS — S161XXA Strain of muscle, fascia and tendon at neck level, initial encounter: Secondary | ICD-10-CM

## 2018-12-13 DIAGNOSIS — S40012A Contusion of left shoulder, initial encounter: Secondary | ICD-10-CM

## 2018-12-13 DIAGNOSIS — S40011A Contusion of right shoulder, initial encounter: Secondary | ICD-10-CM | POA: Diagnosis not present

## 2018-12-13 DIAGNOSIS — S40022A Contusion of left upper arm, initial encounter: Secondary | ICD-10-CM | POA: Insufficient documentation

## 2018-12-13 DIAGNOSIS — R936 Abnormal findings on diagnostic imaging of limbs: Secondary | ICD-10-CM

## 2018-12-13 DIAGNOSIS — G43109 Migraine with aura, not intractable, without status migrainosus: Secondary | ICD-10-CM | POA: Diagnosis not present

## 2018-12-13 MED ORDER — MELOXICAM 15 MG PO TABS: 15.0000 mg | ORAL_TABLET | Freq: Every day | ORAL | 2 refills | Status: DC

## 2018-12-13 NOTE — Progress Notes (Signed)
Patient ID: April Mcmahon, female   DOB: 10-20-1960, 58 y.o.   MRN: [id number] Virtual Visit via Telephone Note  I connected with April Mcmahon on 12/13/18 at  2:10 PM EST by telephone and verified that I am speaking with the correct person using two identifiers.   I discussed the limitations, risks, security and privacy concerns of performing an evaluation and management service by telephone and the availability of in person appointments. I also discussed with the patient that there may be a patient responsible charge related to this service. The patient expressed understanding and agreed to proceed.  Patient location:  home My Location:  [place] office Persons on the call:  Me, the patient, and her daughter(April Mcmahon) interpreting.      History of Present Illness:  Patient's daughter tells me the patient fell off the commode last week and is now having B shoulder pain.  She denies hitting her head or LOC.  Recently tested for Covid with URI s/sx so did not perform in person visit.  ROM is fair.  No swelling.  No obvious bruising.  Not taking anything for pain    Observations/Objective:  NAD.  A&Ox3   Assessment and Plan: 1. Contusion of multiple sites of left shoulder and upper arm, initial encounter - DG Shoulder Left; Future  2. Contusion of multiple sites of right shoulder and upper arm, initial encounter - DG Shoulder Right; Future - meloxicam (MOBIC) 15 MG tablet; Take 1 tablet (15 mg total) by mouth daily.  Dispense: 30 tablet; Refill: 2   Follow Up Instructions: 02/20/19 appt as scheduled;  Sooner if needed   I discussed the assessment and treatment plan with the patient. The patient was provided an opportunity to ask questions and all were answered. The patient agreed with the plan and demonstrated an understanding of the instructions.   The patient was advised to call back or seek an in-person evaluation if the symptoms worsen or if the condition fails to improve as  anticipated.  I provided 11 minutes of non-face-to-face time during this encounter.   Freeman Caldron, PA-C

## 2018-12-14 ENCOUNTER — Ambulatory Visit (HOSPITAL_COMMUNITY)
Admission: EM | Admit: 2018-12-14 | Discharge: 2018-12-14 | Disposition: A | Discharge status: Home / Self Care | Payer: Medicaid Other | Attending: Internal Medicine | Admitting: Internal Medicine

## 2018-12-14 ENCOUNTER — Other Ambulatory Visit: Payer: Self-pay

## 2018-12-14 ENCOUNTER — Encounter (HOSPITAL_COMMUNITY): Payer: Self-pay

## 2018-12-14 ENCOUNTER — Ambulatory Visit (INDEPENDENT_AMBULATORY_CARE_PROVIDER_SITE_OTHER): Payer: Medicaid Other

## 2018-12-14 ENCOUNTER — Telehealth: Payer: Self-pay | Admitting: *Deleted

## 2018-12-14 DIAGNOSIS — M542 Cervicalgia: Secondary | ICD-10-CM

## 2018-12-14 DIAGNOSIS — G43109 Migraine with aura, not intractable, without status migrainosus: Secondary | ICD-10-CM | POA: Diagnosis not present

## 2018-12-14 MED ORDER — METHOCARBAMOL 500 MG PO TABS: 500.0000 mg | ORAL_TABLET | Freq: Three times a day (TID) | ORAL | 0 refills | Status: DC | PRN

## 2018-12-14 MED ORDER — KETOROLAC TROMETHAMINE 30 MG/ML IJ SOLN
30.0000 mg | Freq: Once | INTRAMUSCULAR | Status: AC
  Administered 2018-12-14: 30 mg via INTRAMUSCULAR

## 2018-12-14 MED ORDER — KETOROLAC TROMETHAMINE 30 MG/ML IJ SOLN
INTRAMUSCULAR | Status: AC
  Filled 2018-12-14: qty 1

## 2018-12-14 MED ORDER — IBUPROFEN 600 MG PO TABS: 600.0000 mg | ORAL_TABLET | Freq: Four times a day (QID) | ORAL | 0 refills | Status: DC | PRN

## 2018-12-14 NOTE — Telephone Encounter (Signed)
  Follow up Call-  Call back number 12/12/2018  Post procedure Call Back phone  # [phone]  Permission to leave phone message Yes  Some recent data might be hidden     Patient questions:  Do you have a fever, pain , or abdominal swelling? No. Pain Score  0 *  Have you tolerated food without any problems? Yes.    Have you been able to return to your normal activities? Yes.    Do you have any questions about your discharge instructions: Diet   No. Medications  No. Follow up visit  No.  Do you have questions or concerns about your Care? No.  Actions: * If pain score is 4 or above: No action needed, pain <4.   1. Have you developed a fever since your procedure? no  2.   Have you had an respiratory symptoms (SOB or cough) since your procedure? no  3.   Have you tested positive for COVID 19 since your procedure no  4.   Have you had any family members/close contacts diagnosed with the COVID 19 since your procedure?  no   If yes to any of these questions please route to Joylene John, RN and Alphonsa Gin, Therapist, sports.

## 2018-12-14 NOTE — ED Provider Notes (Signed)
[place]    CSN: [id number] Arrival date & time: 12/14/18  1347      History   Chief Complaint Chief Complaint  Patient presents with  . Neck Pain  . Shoulder Pain  . Headache    HPI April Mcmahon is a 58 y.o. female with history of hypertension and hyperlipidemia comes to urgent care with complaints of neck pain of 1 week duration.  Symptoms started by week ago after she sustained a fall in the bathroom.  Patient denies hitting her head.  She denies any loss of consciousness although she is not able to fully confirm that.  No nausea vomiting or confusion following the event.  She was able to get out of the bathroom by herself.  Following that she has had moderate neck pain which radiates into both shoulders.  Pain is worse with movement of her neck.  It is relieved if she does not move her neck.  She denies any numbness or tingling in the upper extremities.  No weakness.  No facial numbness or weakness.  HPI  Past Medical History:  Diagnosis Date  . Bilateral chronic knee pain 06/2014  . Chronic low back pain 06/2014  . Chronic migraine 02/1985  . Depression    hx of  . Diabetes mellitus without complication ([place]) 123XX123  . Hyperlipidemia   . Hypertension 09/2013    Patient Active Problem List   Diagnosis Date Noted  . Microalbuminuria 11/15/2018  . Muscle cramps 02/21/2018  . Hepatic steatosis 12/23/2017  . MCI (mild cognitive impairment) 02/14/2017  . Diabetic peripheral neuropathy ([place]) 02/14/2017  . Memory loss 12/27/2016  . Dysfunctional uterine bleeding 08/30/2016  . Analgesic rebound headache 06/24/2016  . Esophageal dysphagia 06/21/2016  . Tobacco dependence 01/20/2016  . Plantar fasciitis, bilateral 12/24/2015  . 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 Diabetes type 2, controlled ([place]) 06/19/2015  . Essential hypertension 06/19/2015  . Vitamin D deficiency 09/24/2013  . Hyperlipidemia 09/24/2013  . Migraine 05/15/2013   . Low back pain 08/18/2012    Past Surgical History:  Procedure Laterality Date  . no previous surgeries      OB History   No obstetric history on file.      Home Medications    Prior to Admission medications   Medication Sig Start Date End Date Taking? Authorizing Provider  ACCU-CHEK SOFTCLIX LANCETS lancets Use as instructed 02/24/18   Ladell Pier, MD  amLODipine (NORVASC) 10 MG tablet Take 1 tablet (10 mg total) by mouth daily. 11/13/18   Ladell Pier, MD  atorvastatin (LIPITOR) 10 MG tablet Take 1 tablet (10 mg total) by mouth daily. 11/13/18   Ladell Pier, MD  empagliflozin (JARDIANCE) 10 MG TABS tablet Take 10 mg by mouth daily. 11/13/18   Ladell Pier, MD  gabapentin (NEURONTIN) 300 MG capsule Take 1 capsule (300 mg total) by mouth 2 (two) times daily. 12/12/18   Ladell Pier, MD  glimepiride (AMARYL) 2 MG tablet TAKE ONE TABLET BY MOUTH DAILY BEFORE BREAKFAST 11/13/18   Ladell Pier, MD  glucose blood (ACCU-CHEK AVIVA) test strip Use as instructed 02/24/18   Ladell Pier, MD  ibuprofen (ADVIL) 600 MG tablet Take 1 tablet (600 mg total) by mouth every 6 (six) hours as needed. 12/14/18   Chase Picket, MD  lisinopril (ZESTRIL) 2.5 MG tablet TAKE TWO TABLETS BY MOUTH DAILY 11/29/18   Ladell Pier, MD  metFORMIN (GLUCOPHAGE) 1000 MG tablet  TAKE ONE TABLET BY MOUTH TWICE DAILY WITH MEAL 11/13/18   Ladell Pier, MD  methocarbamol (ROBAXIN) 500 MG tablet Take 1 tablet (500 mg total) by mouth every 8 (eight) hours as needed for muscle spasms. 12/14/18   Chase Picket, MD  nicotine (NICODERM CQ - DOSED IN MG/24 HOURS) 21 mg/24hr patch unwrap AND APPLY ONE PATCH TO THE SKIN DAILY 12/12/18   Ladell Pier, MD  sitaGLIPtin (JANUVIA) 50 MG tablet Take 1 tablet (50 mg total) by mouth daily. 11/13/18   Ladell Pier, MD  VOLTAREN 1 % GEL APPLY TWO GRAMS EXTERNALLY TO AFFECTED AREA FOUR TIMES DAILY 08/17/18   Ladell Pier, MD  fluticasone [facility]) 50 MCG/ACT nasal spray Place 1 spray into both nostrils daily. 10/13/16 12/14/18  Argentina Donovan, PA-C  SUMAtriptan (IMITREX) 100 MG tablet Take 1 tablet (100 mg total) by mouth as needed for migraine. May repeat in 2 hours if headache persists or recurs.  Do not refill in less than 30 days Patient not taking: Reported on 11/29/2018 02/21/18 12/14/18  Ladell Pier, MD  venlafaxine [facility]) 37.5 MG tablet Take 1 tablet (37.5 mg total) by mouth 2 (two) times daily with a meal. 02/09/17 12/14/18  Marcial Pacas, MD    Family History Family History  Problem Relation Age of Onset  . Migraines Mother   . Esophageal cancer Other   . Breast cancer Neg Hx   . Colon cancer Neg Hx   . Rectal cancer Neg Hx   . Stomach cancer Neg Hx     Social History Social History   Tobacco Use  . Smoking status: Current Some Day Smoker  . Smokeless tobacco: Never Used  Substance Use Topics  . Alcohol use: No  . Drug use: No     Allergies   Elavil [amitriptyline hcl]   Review of Systems Review of Systems  Constitutional: Positive for activity change. Negative for fatigue.  HENT: Negative.   Eyes: Negative for photophobia and visual disturbance.  Respiratory: Negative for cough, chest tightness and shortness of breath.   Cardiovascular: Negative.   Gastrointestinal: Negative.  Negative for diarrhea, nausea and vomiting.  Genitourinary: Negative.   Musculoskeletal: Positive for arthralgias, back pain, myalgias, neck pain and neck stiffness. Negative for gait problem and joint swelling.  Skin: Negative.   Neurological: Negative for dizziness, weakness, light-headedness and headaches.  Psychiatric/Behavioral: Negative for confusion and decreased concentration.     Physical Exam Triage Vital Signs ED Triage Vitals  Enc Vitals Group     BP 12/14/18 1410 (!) 134/56     Pulse Rate 12/14/18 1410 78     Resp 12/14/18 1410 16     Temp 12/14/18 1410 98.1 F  (36.7 C)     Temp Source 12/14/18 1410 Oral     SpO2 12/14/18 1410 100 %     Weight --      Height --      Head Circumference --      Peak Flow --      Pain Score 12/14/18 1404 10     Pain Loc --      Pain Edu? --      Excl. in [place]? --    No data found.  Updated Vital Signs BP (!) 134/56 (BP Location: Right Arm)   Pulse 78   Temp 98.1 F (36.7 C) (Oral)   Resp 16   SpO2 100%   Visual Acuity Right Eye Distance:   Left  Eye Distance:   Bilateral Distance:    Right Eye Near:   Left Eye Near:    Bilateral Near:     Physical Exam Vitals signs and nursing note reviewed.  Constitutional:      General: She is in acute distress.     Appearance: She is ill-appearing. She is not toxic-appearing.  HENT:     Mouth/Throat:     Mouth: Mucous membranes are moist.  Eyes:     Extraocular Movements: Extraocular movements intact.     Right eye: Normal extraocular motion.     Left eye: Normal extraocular motion.     Pupils: Pupils are equal, round, and reactive to light.  Neck:     Comments: Range of motion is limited by pain.  Patient is able to fully flex and extend her neck.  Tenderness to palpation over the trapezius muscles bilaterally.  No weakness in the upper extremities.  Deep tendon reflexes are 2+ in all areas. Cardiovascular:     Rate and Rhythm: Normal rate and regular rhythm.     Heart sounds: Normal heart sounds. No murmur. No friction rub.  Pulmonary:     Effort: Pulmonary effort is normal.     Breath sounds: Normal breath sounds.  Abdominal:     General: Bowel sounds are normal.     Palpations: Abdomen is soft.  Musculoskeletal: Normal range of motion.  Skin:    General: Skin is warm.     Capillary Refill: Capillary refill takes less than 2 seconds.     Coloration: Skin is not cyanotic.     Findings: No rash.  Neurological:     Mental Status: She is alert and oriented to person, place, and time. Mental status is at baseline.     GCS: GCS eye subscore is 4.  GCS verbal subscore is 5. GCS motor subscore is 6.     Cranial Nerves: No cranial nerve deficit, dysarthria or facial asymmetry.     Sensory: No sensory deficit.     Motor: No weakness.     Coordination: Romberg sign negative. Coordination normal.     Deep Tendon Reflexes: Reflexes normal.  Psychiatric:        Mood and Affect: Mood normal. Mood is not anxious.        Behavior: Behavior is not agitated.        Cognition and Memory: Memory is not impaired.      UC Treatments / Results  Labs (all labs ordered are listed, but only abnormal results are displayed) Labs Reviewed - No data to display  EKG   Radiology Dg Cervical Spine Complete  Result Date: 12/14/2018 CLINICAL DATA:  58 year old female with neck pain. EXAM: CERVICAL SPINE - COMPLETE 4+ VIEW COMPARISON:  None. FINDINGS: There is no acute fracture or subluxation of the cervical spine. There is straightening of normal cervical lordosis which may be positional or due to muscle spasm. The visualized posterior elements and odontoid appear intact. There is anatomic alignment of the lateral masses of C1 and C2. There is mild degenerative changes primarily at C6-C7 with probable mild associated neural foraminal narrowing at this level. The soft tissues are unremarkable. IMPRESSION: 1. No acute fracture or subluxation of the cervical spine. 2. Mild degenerative changes of the cervical spine most prominent at C6-C7. Electronically Signed   By: Anner Crete M.D.   On: 12/14/2018 14:50   Dg Shoulder Right  Result Date: 12/13/2018 CLINICAL DATA:  Fall, shoulder contusions EXAM: RIGHT SHOULDER -  2+ VIEW COMPARISON:  None. FINDINGS: No fracture or dislocation of the right shoulder. There is mild, asymmetric widening of the right acromioclavicular joint, approximately 5 mm. The glenohumeral joint is preserved. The partially imaged right chest is unremarkable. IMPRESSION: 1. No fracture or dislocation of the right shoulder. 2. There is  mild, asymmetric widening of the right acromioclavicular joint, approximately 5 mm. Correlate for tenderness and laxity of the acromioclavicular joint. 3.  The glenohumeral joint is preserved. Electronically Signed   By: Eddie Candle M.D.   On: 12/13/2018 15:52   Dg Shoulder Left  Result Date: 12/13/2018 CLINICAL DATA:  Fall, shoulder contusions EXAM: LEFT SHOULDER - 2+ VIEW COMPARISON:  None. FINDINGS: No fracture or dislocation of the left shoulder. Joint spaces are preserved. The partially imaged left chest is unremarkable. IMPRESSION: No fracture or dislocation of the left shoulder. Joint spaces are preserved. Electronically Signed   By: Eddie Candle M.D.   On: 12/13/2018 15:50    Procedures Procedures (including critical care time)  Medications Ordered in UC Medications  ketorolac (TORADOL) 30 MG/ML injection 30 mg (30 mg Intramuscular Given 12/14/18 1446)  ketorolac (TORADOL) 30 MG/ML injection (has no administration in time range)    Initial Impression / Assessment and Plan / UC Course  I have reviewed the triage vital signs and the nursing notes.  Pertinent labs & imaging results that were available during my care of the patient were reviewed by me and considered in my medical decision making (see chart for details).     1.  Neck pain: X-ray of the cervical spine is negative for acute fracture.  Patient has mild degenerative disc disease. Toradol 30 mg IM x1 dose Ibuprofen 600 mg every 6 hours as needed Robaxin 500 mg every 8 hours as needed for muscle spasms If patient symptoms does not improve or if it worsens or if patient develops any weakness in the upper extremities he/she will benefit from CT of the cervical spine. Final Clinical Impressions(s) / UC Diagnoses   Final diagnoses:  Musculoskeletal neck pain   Discharge Instructions   None    ED Prescriptions    Medication Sig Dispense Auth. Provider   ibuprofen (ADVIL) 600 MG tablet Take 1 tablet (600 mg total) by  mouth every 6 (six) hours as needed. 30 tablet , Myrene Galas, MD   methocarbamol (ROBAXIN) 500 MG tablet Take 1 tablet (500 mg total) by mouth every 8 (eight) hours as needed for muscle spasms. 30 tablet , Myrene Galas, MD     PDMP not reviewed this encounter.   Chase Picket, MD 12/14/18 [phone]

## 2018-12-14 NOTE — ED Triage Notes (Signed)
Pt presents to the UC with neck pain, bilateral shoulder pain and headache x 1 week. Pt states the neck pain and shoulder pain wakes her up at night. Pt is taking gabapentin 300 mg.

## 2018-12-15 DIAGNOSIS — G43109 Migraine with aura, not intractable, without status migrainosus: Secondary | ICD-10-CM | POA: Diagnosis not present

## 2018-12-16 DIAGNOSIS — G43109 Migraine with aura, not intractable, without status migrainosus: Secondary | ICD-10-CM | POA: Diagnosis not present

## 2018-12-17 DIAGNOSIS — G43109 Migraine with aura, not intractable, without status migrainosus: Secondary | ICD-10-CM | POA: Diagnosis not present

## 2018-12-18 DIAGNOSIS — G43109 Migraine with aura, not intractable, without status migrainosus: Secondary | ICD-10-CM | POA: Diagnosis not present

## 2018-12-19 ENCOUNTER — Encounter: Payer: Self-pay | Admitting: Gastroenterology

## 2018-12-19 DIAGNOSIS — G43109 Migraine with aura, not intractable, without status migrainosus: Secondary | ICD-10-CM | POA: Diagnosis not present

## 2018-12-20 DIAGNOSIS — G43109 Migraine with aura, not intractable, without status migrainosus: Secondary | ICD-10-CM | POA: Diagnosis not present

## 2018-12-21 DIAGNOSIS — G43109 Migraine with aura, not intractable, without status migrainosus: Secondary | ICD-10-CM | POA: Diagnosis not present

## 2018-12-22 DIAGNOSIS — G43109 Migraine with aura, not intractable, without status migrainosus: Secondary | ICD-10-CM | POA: Diagnosis not present

## 2018-12-23 DIAGNOSIS — G43109 Migraine with aura, not intractable, without status migrainosus: Secondary | ICD-10-CM | POA: Diagnosis not present

## 2018-12-24 DIAGNOSIS — G43109 Migraine with aura, not intractable, without status migrainosus: Secondary | ICD-10-CM | POA: Diagnosis not present

## 2018-12-25 DIAGNOSIS — G43109 Migraine with aura, not intractable, without status migrainosus: Secondary | ICD-10-CM | POA: Diagnosis not present

## 2018-12-26 DIAGNOSIS — G43109 Migraine with aura, not intractable, without status migrainosus: Secondary | ICD-10-CM | POA: Diagnosis not present

## 2018-12-27 ENCOUNTER — Telehealth (INDEPENDENT_AMBULATORY_CARE_PROVIDER_SITE_OTHER): Payer: Self-pay | Admitting: *Deleted

## 2018-12-27 DIAGNOSIS — G43109 Migraine with aura, not intractable, without status migrainosus: Secondary | ICD-10-CM | POA: Diagnosis not present

## 2018-12-27 NOTE — Telephone Encounter (Signed)
Patient daughter in law informed of message and was given number to [place].      Notes recorded by Carilyn Goodpasture, RN on 12/27/2018 at 11:33 AM EST  Unable to reach- Language barrier, Interpreter assistance provided by [place], [phone]  ------   Notes recorded by Nat Christen, CMA on 12/15/2018 at 5:03 PM EST  Call placed using [place] interpreter [phone]) left voicemail asking patient to return call to [facility] at [phone]. Tempestt Latrelle Dodrill, CMA  ------   Notes recorded by Argentina Donovan, PA-C on 12/13/2018 at 4:17 PM EST  Please call patient. The L shoulder xray is normal without any fracture. The R shoulder shows a mild separation that may have resulted from her fall. Take the medications I sent and we will have her see an orthopedist. Thanks, Freeman Caldron, PA-C

## 2018-12-28 DIAGNOSIS — G43109 Migraine with aura, not intractable, without status migrainosus: Secondary | ICD-10-CM | POA: Diagnosis not present

## 2018-12-29 DIAGNOSIS — G43109 Migraine with aura, not intractable, without status migrainosus: Secondary | ICD-10-CM | POA: Diagnosis not present

## 2018-12-30 DIAGNOSIS — G43109 Migraine with aura, not intractable, without status migrainosus: Secondary | ICD-10-CM | POA: Diagnosis not present

## 2018-12-31 DIAGNOSIS — G43109 Migraine with aura, not intractable, without status migrainosus: Secondary | ICD-10-CM | POA: Diagnosis not present

## 2019-01-01 DIAGNOSIS — G43109 Migraine with aura, not intractable, without status migrainosus: Secondary | ICD-10-CM | POA: Diagnosis not present

## 2019-01-02 ENCOUNTER — Encounter: Payer: Self-pay | Admitting: Orthopaedic Surgery

## 2019-01-02 ENCOUNTER — Ambulatory Visit (INDEPENDENT_AMBULATORY_CARE_PROVIDER_SITE_OTHER): Payer: Medicaid Other | Admitting: Orthopaedic Surgery

## 2019-01-02 ENCOUNTER — Other Ambulatory Visit: Payer: Self-pay

## 2019-01-02 DIAGNOSIS — M542 Cervicalgia: Secondary | ICD-10-CM | POA: Diagnosis not present

## 2019-01-02 DIAGNOSIS — M79642 Pain in left hand: Secondary | ICD-10-CM | POA: Diagnosis not present

## 2019-01-02 DIAGNOSIS — G8929 Other chronic pain: Secondary | ICD-10-CM

## 2019-01-02 DIAGNOSIS — M25512 Pain in left shoulder: Secondary | ICD-10-CM

## 2019-01-02 DIAGNOSIS — M25511 Pain in right shoulder: Secondary | ICD-10-CM | POA: Diagnosis not present

## 2019-01-02 DIAGNOSIS — G43109 Migraine with aura, not intractable, without status migrainosus: Secondary | ICD-10-CM | POA: Diagnosis not present

## 2019-01-02 DIAGNOSIS — M79641 Pain in right hand: Secondary | ICD-10-CM

## 2019-01-02 NOTE — Progress Notes (Signed)
Office Visit Note   Patient: April Mcmahon           Date of Birth: Dec 16, 1960           MRN: [id number] Visit Date: 01/02/2019              Requested by: Argentina Donovan, PA-C [place],  [place] [address] PCP: Ladell Pier, MD   Assessment & Plan: Visit Diagnoses:  1. Cervicalgia   2. Chronic pain of both shoulders     Plan: I reviewed the cervical spine x-rays which shows mild degenerative changes.  I recommend physical therapy for the neck pain and trapezius pain.  We will obtain arthritis panel to rule out inflammatory arthritis.  Carpal tunnel braces for night time.  Nerve conduction studies ordered to evaluate for carpal tunnel syndrome.  Follow-up to review nerve conduction studies.  Today's encounter was performed through an interpreter.  Follow-Up Instructions: Return if symptoms worsen or fail to improve.   Orders:  Orders Placed This Encounter  Procedures  . Ambulatory referral to Physical Therapy   No orders of the defined types were placed in this encounter.     Procedures: No procedures performed   Clinical Data: No additional findings.   Subjective: Chief Complaint  Patient presents with  . Neck - Pain  . Right Shoulder - Pain  . Left Shoulder - Pain    Patient is a healthy 58 year old female presents with her interpreter today for evaluation of bilateral shoulder and neck pain as well as bilateral hand numbness and tingling.  She denies any injuries.  She endorses numbness and tingling and pain worse at night.  She is dropping things from her hands.  Denies any history of inflammatory arthritis.  She has used ibuprofen and gabapentin with temporary relief.   Review of Systems  Constitutional: Negative.   HENT: Negative.   Eyes: Negative.   Respiratory: Negative.   Cardiovascular: Negative.   Endocrine: Negative.   Musculoskeletal: Negative.   Neurological: Negative.   Hematological: Negative.    Psychiatric/Behavioral: Negative.   All other systems reviewed and are negative.    Objective: Vital Signs: There were no vitals taken for this visit.  Physical Exam Vitals signs and nursing note reviewed.  Constitutional:      Appearance: She is well-developed.  HENT:     Head: Normocephalic and atraumatic.  Neck:     Musculoskeletal: Neck supple.  Pulmonary:     Effort: Pulmonary effort is normal.  Abdominal:     Palpations: Abdomen is soft.  Skin:    General: Skin is warm.     Capillary Refill: Capillary refill takes less than 2 seconds.  Neurological:     Mental Status: She is alert and oriented to person, place, and time.  Psychiatric:        Behavior: Behavior normal.        Thought Content: Thought content normal.        Judgment: Judgment normal.     Ortho Exam Cervical spine exam shows mild tenderness palpation along the spinous processes.  Range of motion is slightly decreased secondary to discomfort.  She is tender in the trapezius muscles.  Slight decreased range of motion of the shoulder secondary to discomfort.  Positive carpal tunnel compressive signs. Specialty Comments:  No specialty comments available.  Imaging: No results found.   PMFS History: Patient Active Problem List   Diagnosis Date Noted  . Microalbuminuria 11/15/2018  .  Muscle cramps 02/21/2018  . Hepatic steatosis 12/23/2017  . MCI (mild cognitive impairment) 02/14/2017  . Diabetic peripheral neuropathy ([place]) 02/14/2017  . Memory loss 12/27/2016  . Dysfunctional uterine bleeding 08/30/2016  . Analgesic rebound headache 06/24/2016  . Esophageal dysphagia 06/21/2016  . Tobacco dependence 01/20/2016  . Plantar fasciitis, bilateral 12/24/2015  . 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 Diabetes type 2, controlled ([place]) 06/19/2015  . Essential hypertension 06/19/2015  . Vitamin D deficiency 09/24/2013  . Hyperlipidemia 09/24/2013  . Migraine 05/15/2013  . Low back pain  08/18/2012   Past Medical History:  Diagnosis Date  . Bilateral chronic knee pain 06/2014  . Chronic low back pain 06/2014  . Chronic migraine 02/1985  . Depression    hx of  . Diabetes mellitus without complication ([place]) 123XX123  . Hyperlipidemia   . Hypertension 09/2013    Family History  Problem Relation Age of Onset  . Migraines Mother   . Esophageal cancer Other   . Breast cancer Neg Hx   . Colon cancer Neg Hx   . Rectal cancer Neg Hx   . Stomach cancer Neg Hx     Past Surgical History:  Procedure Laterality Date  . no previous surgeries     Social History   Occupational History  . Not on file  Tobacco Use  . Smoking status: Current Some Day Smoker  . Smokeless tobacco: Never Used  Substance and Sexual Activity  . Alcohol use: No  . Drug use: No  . Sexual activity: Not on file

## 2019-01-02 NOTE — Addendum Note (Signed)
Addended by: Precious Bard on: 01/02/2019 04:05 PM   Modules accepted: Orders

## 2019-01-03 ENCOUNTER — Ambulatory Visit (AMBULATORY_SURGERY_CENTER): Payer: Self-pay | Admitting: *Deleted

## 2019-01-03 ENCOUNTER — Other Ambulatory Visit: Payer: Self-pay

## 2019-01-03 VITALS — Ht 59.0 in | Wt 154.0 lb

## 2019-01-03 DIAGNOSIS — Z86010 Personal history of colonic polyps: Secondary | ICD-10-CM

## 2019-01-03 DIAGNOSIS — G43109 Migraine with aura, not intractable, without status migrainosus: Secondary | ICD-10-CM | POA: Diagnosis not present

## 2019-01-03 MED ORDER — SUPREP BOWEL PREP KIT 17.5-3.13-1.6 GM/177ML PO SOLN: 1.0000 | Freq: Once | ORAL | 0 refills | Status: AC

## 2019-01-03 NOTE — Progress Notes (Signed)
Intrerpreter: April Mcmahon Daughter in law present  No egg or soy allergy known to patient  No issues with past sedation with any surgeries  or procedures, no intubation problems  No diet pills per patient No home 02 use per patient  No blood thinners per patient  Pt denies issues with constipation  No A fib or A flutter  EMMI  Not given  Due to the COVID-19 pandemic we are asking patients to follow these guidelines. Please only bring one care partner. Please be aware that your care partner may wait in the car in the parking lot or if they feel like they will be too hot to wait in the car, they may wait in the lobby on the 4th floor. All care partners are required to wear a mask the entire time (we do not have any that we can provide them), they need to practice social distancing, and we will do a Covid check for all patient's and care partners when you arrive. Also we will check their temperature and your temperature. If the care partner waits in their car they need to stay in the parking lot the entire time and we will call them on their cell phone when the patient is ready for discharge so they can bring the car to the front of the building. Also all patient's will need to wear a mask into building.

## 2019-01-04 ENCOUNTER — Encounter: Payer: Self-pay | Admitting: Internal Medicine

## 2019-01-04 ENCOUNTER — Ambulatory Visit: Payer: Medicaid Other | Attending: Internal Medicine | Admitting: Internal Medicine

## 2019-01-04 VITALS — BP 147/81 | HR 74 | Temp 99.1°F | Resp 16 | Wt 155.0 lb

## 2019-01-04 DIAGNOSIS — Z79899 Other long term (current) drug therapy: Secondary | ICD-10-CM | POA: Insufficient documentation

## 2019-01-04 DIAGNOSIS — F17200 Nicotine dependence, unspecified, uncomplicated: Secondary | ICD-10-CM | POA: Diagnosis not present

## 2019-01-04 DIAGNOSIS — Z8669 Personal history of other diseases of the nervous system and sense organs: Secondary | ICD-10-CM | POA: Diagnosis not present

## 2019-01-04 DIAGNOSIS — R413 Other amnesia: Secondary | ICD-10-CM

## 2019-01-04 DIAGNOSIS — M542 Cervicalgia: Secondary | ICD-10-CM | POA: Insufficient documentation

## 2019-01-04 DIAGNOSIS — Z7984 Long term (current) use of oral hypoglycemic drugs: Secondary | ICD-10-CM | POA: Insufficient documentation

## 2019-01-04 DIAGNOSIS — I10 Essential (primary) hypertension: Secondary | ICD-10-CM | POA: Diagnosis not present

## 2019-01-04 DIAGNOSIS — G43909 Migraine, unspecified, not intractable, without status migrainosus: Secondary | ICD-10-CM | POA: Diagnosis not present

## 2019-01-04 DIAGNOSIS — M170 Bilateral primary osteoarthritis of knee: Secondary | ICD-10-CM | POA: Diagnosis not present

## 2019-01-04 DIAGNOSIS — E1142 Type 2 diabetes mellitus with diabetic polyneuropathy: Secondary | ICD-10-CM | POA: Insufficient documentation

## 2019-01-04 DIAGNOSIS — E785 Hyperlipidemia, unspecified: Secondary | ICD-10-CM | POA: Insufficient documentation

## 2019-01-04 DIAGNOSIS — K760 Fatty (change of) liver, not elsewhere classified: Secondary | ICD-10-CM | POA: Insufficient documentation

## 2019-01-04 DIAGNOSIS — G43109 Migraine with aura, not intractable, without status migrainosus: Secondary | ICD-10-CM | POA: Diagnosis not present

## 2019-01-04 NOTE — Progress Notes (Signed)
Patient ID: April Mcmahon, female    DOB: December 14, 1960  MRN: [id number]  CC: paperwork   Subjective: April Mcmahon is a 58 y.o. female who presents for form completion.  Daughter-in-law and interpreter, Vincent Gros, from [place] are with her Her concerns today include:  Pt with hx of DMneuropathy, HTN, HL, tob dep, migraines, Vit D def, DJD knees and plantar fasciitis, Abn LFT with steatosis on U/S.  Daughter-in-law has applied for CAP program.so that she can care for pt in the home.  Daughter-in-law sent me the form most of which she completed herself but I had some questions regarding answers about pt's mental status.  Currently has a care giver who comes 7 days a wk for 3 hrs in the evenings from 5-8 p.m. Need helps bathing.  She takes showers and has shower stool.  No shower bars. Needs help with dressing.  Has DJD in knees.  Has a walker and cane and uses both at home.  Walker can not get into bathroom so she uses cane when in bathroom.  [place] last mth at home when she was getting off commode.  Was bending forward and fell forward, land on hands to break fall.  She tells me that toilet seat is high enough and does not think she needs seat booster.  Seen in UC with c/o neck pain post fall. Subsequently seen by ortho and was referred for P.T on neck -daughter-in-law tells me that pt has problems remembering things and she has to be supervised.  Sometimes she turns water and stove on and forgets to turn them off. -pt gets mad and irritated when grand kids cry or scream a lot  Daughter-in-law requested RF on migraine med for her  Patient Active Problem List   Diagnosis Date Noted  . Microalbuminuria 11/15/2018  . Muscle cramps 02/21/2018  . Hepatic steatosis 12/23/2017  . MCI (mild cognitive impairment) 02/14/2017  . Diabetic peripheral neuropathy ([place]) 02/14/2017  . Memory loss 12/27/2016  . Dysfunctional uterine bleeding 08/30/2016  . Analgesic rebound headache 06/24/2016   . Esophageal dysphagia 06/21/2016  . Tobacco dependence 01/20/2016  . Plantar fasciitis, bilateral 12/24/2015  . 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 Diabetes type 2, controlled ([place]) 06/19/2015  . Essential hypertension 06/19/2015  . Vitamin D deficiency 09/24/2013  . Hyperlipidemia 09/24/2013  . Migraine 05/15/2013  . Low back pain 08/18/2012     Current Outpatient Medications on File Prior to Visit  Medication Sig Dispense Refill  . ACCU-CHEK SOFTCLIX LANCETS lancets Use as instructed 100 each 12  . amLODipine (NORVASC) 10 MG tablet Take 1 tablet (10 mg total) by mouth daily. 30 tablet 6  . atorvastatin (LIPITOR) 10 MG tablet Take 1 tablet (10 mg total) by mouth daily. 30 tablet 6  . empagliflozin (JARDIANCE) 10 MG TABS tablet Take 10 mg by mouth daily. 30 tablet 6  . gabapentin (NEURONTIN) 300 MG capsule Take 1 capsule (300 mg total) by mouth 2 (two) times daily. 60 capsule 5  . glimepiride (AMARYL) 2 MG tablet TAKE ONE TABLET BY MOUTH DAILY BEFORE BREAKFAST 30 tablet 2  . glucose blood (ACCU-CHEK AVIVA) test strip Use as instructed 100 each 12  . ibuprofen (ADVIL) 600 MG tablet Take 1 tablet (600 mg total) by mouth every 6 (six) hours as needed. 30 tablet 0  . lisinopril (ZESTRIL) 2.5 MG tablet TAKE TWO TABLETS BY MOUTH DAILY 60 tablet 2  . metFORMIN (GLUCOPHAGE) 1000 MG tablet TAKE ONE  TABLET BY MOUTH TWICE DAILY WITH MEAL 60 tablet 6  . methocarbamol (ROBAXIN) 500 MG tablet Take 1 tablet (500 mg total) by mouth every 8 (eight) hours as needed for muscle spasms. 30 tablet 0  . nicotine (NICODERM CQ - DOSED IN MG/24 HOURS) 21 mg/24hr patch unwrap AND APPLY ONE PATCH TO THE SKIN DAILY 28 patch 1  . sitaGLIPtin (JANUVIA) 50 MG tablet Take 1 tablet (50 mg total) by mouth daily. 30 tablet 5  . VOLTAREN 1 % GEL APPLY TWO GRAMS EXTERNALLY TO AFFECTED AREA FOUR TIMES DAILY 100 g 3  . [DISCONTINUED] fluticasone (FLONASE) 50 MCG/ACT nasal spray Place 1 spray into both  nostrils daily. 16 g 2  . [DISCONTINUED] SUMAtriptan (IMITREX) 100 MG tablet Take 1 tablet (100 mg total) by mouth as needed for migraine. May repeat in 2 hours if headache persists or recurs.  Do not refill in less than 30 days (Patient not taking: Reported on 11/29/2018) 10 tablet 6  . [DISCONTINUED] venlafaxine (EFFEXOR) 37.5 MG tablet Take 1 tablet (37.5 mg total) by mouth 2 (two) times daily with a meal. 60 tablet 11   No current facility-administered medications on file prior to visit.     Allergies  Allergen Reactions  . Elavil [Amitriptyline Hcl] Diarrhea and Nausea And Vomiting    Social History   Socioeconomic History  . Marital status: Married    Spouse name: Not on file  . Number of children: Not on file  . Years of education: Not on file  . Highest education level: Not on file  Occupational History  . Not on file  Social Needs  . Financial resource strain: Not on file  . Food insecurity    Worry: Not on file    Inability: Not on file  . Transportation needs    Medical: Not on file    Non-medical: Not on file  Tobacco Use  . Smoking status: Current Some Day Smoker  . Smokeless tobacco: Never Used  Substance and Sexual Activity  . Alcohol use: No  . Drug use: No  . Sexual activity: Not on file  Lifestyle  . Physical activity    Days per week: Not on file    Minutes per session: Not on file  . Stress: Not on file  Relationships  . Social Herbalist on phone: Not on file    Gets together: Not on file    Attends religious service: Not on file    Active member of club or organization: Not on file    Attends meetings of clubs or organizations: Not on file    Relationship status: Not on file  . Intimate partner violence    Fear of current or ex partner: Not on file    Emotionally abused: Not on file    Physically abused: Not on file    Forced sexual activity: Not on file  Other Topics Concern  . Not on file  Social History Narrative  . Not on  file    Family History  Problem Relation Age of Onset  . Migraines Mother   . Esophageal cancer Other   . Breast cancer Neg Hx   . Colon cancer Neg Hx   . Rectal cancer Neg Hx   . Stomach cancer Neg Hx     Past Surgical History:  Procedure Laterality Date  . no previous surgeries      ROS: Review of Systems Negative except as stated above  PHYSICAL EXAM:  BP (!) 147/81   Pulse 74   Temp 99.1 F (37.3 C) (Oral)   Resp 16   Wt 155 lb (70.3 kg)   SpO2 98%   BMI 31.31 kg/m   Physical Exam  General appearance - alert, well appearing, and in no distress Mental status - pt thinks today is Wed (it is Thursday).  She is unable to give me mth and year based on english calender.  Interpreter tells me she is giving it based on [place] but interpreter and daughter-in-law not sure if correct as they do not know the [place] calender well Chest - clear to auscultation, no wheezes, rales or rhonchi, symmetric air entry Heart - normal rate, regular rhythm, normal S1, S2, no murmurs, rubs, clicks or gallops Extremities - no LE edema MSK:  Knees - enlargement of jts. Mild discomfort on passive movement.  Does not have cane or walker with her  CMP Latest Ref Rng & Units 11/13/2018 03/07/2018 12/23/2017  Glucose 65 - 99 mg/dL 154(H) 94 -  BUN 6 - 24 mg/dL 10 10 -  Creatinine 0.57 - 1.00 mg/dL 0.70 0.67 -  Sodium 134 - 144 mmol/L 140 140 -  Potassium 3.5 - 5.2 mmol/L 4.5 4.7 -  Chloride 96 - 106 mmol/L 102 103 -  CO2 20 - 29 mmol/L 23 22 -  Calcium 8.7 - 10.2 mg/dL 9.9 9.6 -  Total Protein 6.0 - 8.5 g/dL 7.9 7.6 7.2  Total Bilirubin 0.0 - 1.2 mg/dL 0.6 0.5 0.3  Alkaline Phos 39 - 117 IU/L 181(H) 139(H) 137(H)  AST 0 - 40 IU/L 31 28 55(H)  ALT 0 - 32 IU/L 43(H) 36(H) 73(H)   Lipid Panel     Component Value Date/Time   CHOL 165 11/13/2018 1027   TRIG 204 (H) 11/13/2018 1027   HDL 57 11/13/2018 1027   CHOLHDL 2.9 11/13/2018 1027   CHOLHDL 3.3 10/20/2015 1625   VLDL 46 (H)  10/20/2015 1625   LDLCALC 74 11/13/2018 1027    CBC    Component Value Date/Time   WBC 7.9 11/13/2018 1027   WBC 9.7 10/10/2017 1941   RBC 5.35 (H) 11/13/2018 1027   RBC 4.81 10/10/2017 1941   HGB 14.3 11/13/2018 1027   HCT 44.3 11/13/2018 1027   PLT 235 11/13/2018 1027   MCV 83 11/13/2018 1027   MCH 26.7 11/13/2018 1027   MCH 26.4 10/10/2017 1941   MCHC 32.3 11/13/2018 1027   MCHC 31.0 10/10/2017 1941   RDW 13.2 11/13/2018 1027   LYMPHSABS 2,948 10/20/2015 1625   MONOABS 335 10/20/2015 1625   EOSABS 268 10/20/2015 1625   BASOSABS 0 10/20/2015 1625    ASSESSMENT AND PLAN: 1. Primary osteoarthritis of both knees -rxn given for shower bars.  Declines seat booster for toilet -continue to use cane/walker  2. Memory difficulties -difficult to eval fully given lang barrier Advised not to allow pt to use stove if she has problems remembering to turn off Will complete form for CAP  3. Neck pain Referred to P.T  4. History of migraine   Patient was given the opportunity to ask questions.  Patient verbalized understanding of the plan and was able to repeat key elements of the plan.   No orders of the defined types were placed in this encounter.    Requested Prescriptions    No prescriptions requested or ordered in this encounter    No follow-ups on file.  Karle Plumber, MD, FACP

## 2019-01-05 DIAGNOSIS — G43109 Migraine with aura, not intractable, without status migrainosus: Secondary | ICD-10-CM | POA: Diagnosis not present

## 2019-01-06 DIAGNOSIS — G43109 Migraine with aura, not intractable, without status migrainosus: Secondary | ICD-10-CM | POA: Diagnosis not present

## 2019-01-07 DIAGNOSIS — G43109 Migraine with aura, not intractable, without status migrainosus: Secondary | ICD-10-CM | POA: Diagnosis not present

## 2019-01-08 ENCOUNTER — Other Ambulatory Visit: Payer: Self-pay | Admitting: Internal Medicine

## 2019-01-08 ENCOUNTER — Other Ambulatory Visit: Payer: Self-pay

## 2019-01-08 DIAGNOSIS — IMO0002 Reserved for concepts with insufficient information to code with codable children: Secondary | ICD-10-CM

## 2019-01-08 DIAGNOSIS — G43109 Migraine with aura, not intractable, without status migrainosus: Secondary | ICD-10-CM | POA: Diagnosis not present

## 2019-01-08 DIAGNOSIS — Z20828 Contact with and (suspected) exposure to other viral communicable diseases: Secondary | ICD-10-CM | POA: Diagnosis not present

## 2019-01-08 DIAGNOSIS — Z20822 Contact with and (suspected) exposure to covid-19: Secondary | ICD-10-CM

## 2019-01-08 DIAGNOSIS — E1165 Type 2 diabetes mellitus with hyperglycemia: Secondary | ICD-10-CM

## 2019-01-09 DIAGNOSIS — G43109 Migraine with aura, not intractable, without status migrainosus: Secondary | ICD-10-CM | POA: Diagnosis not present

## 2019-01-10 ENCOUNTER — Encounter: Payer: Self-pay | Admitting: Gastroenterology

## 2019-01-10 ENCOUNTER — Other Ambulatory Visit: Payer: Self-pay

## 2019-01-10 ENCOUNTER — Ambulatory Visit (AMBULATORY_SURGERY_CENTER): Payer: Medicaid Other | Admitting: Gastroenterology

## 2019-01-10 VITALS — BP 140/69 | HR 54 | Temp 97.4°F | Resp 17 | Ht 59.0 in | Wt 154.0 lb

## 2019-01-10 DIAGNOSIS — D122 Benign neoplasm of ascending colon: Secondary | ICD-10-CM | POA: Diagnosis not present

## 2019-01-10 DIAGNOSIS — Z86010 Personal history of colonic polyps: Secondary | ICD-10-CM

## 2019-01-10 DIAGNOSIS — G43109 Migraine with aura, not intractable, without status migrainosus: Secondary | ICD-10-CM | POA: Diagnosis not present

## 2019-01-10 DIAGNOSIS — Z1211 Encounter for screening for malignant neoplasm of colon: Secondary | ICD-10-CM | POA: Diagnosis not present

## 2019-01-10 LAB — NOVEL CORONAVIRUS, NAA: SARS-CoV-2, NAA: NOT DETECTED

## 2019-01-10 MED ORDER — SODIUM CHLORIDE 0.9 % IV SOLN: 500.0000 mL | Freq: Once | INTRAVENOUS | Status: DC

## 2019-01-10 NOTE — Progress Notes (Signed)
Pt's states no medical or surgical changes since previsit or office visit.  Temp taken by JB VS taken by CW 

## 2019-01-10 NOTE — Progress Notes (Signed)
Interpreter Casimer Leek present in recovery

## 2019-01-10 NOTE — Patient Instructions (Signed)
Handout on polyps given   YOU HAD AN ENDOSCOPIC PROCEDURE TODAY AT THE [facility]:   Refer to the procedure report that was given to you for any specific questions about what was found during the examination.  If the procedure report does not answer your questions, please call your gastroenterologist to clarify.  If you requested that your care partner not be given the details of your procedure findings, then the procedure report has been included in a sealed envelope for you to review at your convenience later.  YOU SHOULD EXPECT: Some feelings of bloating in the abdomen. Passage of more gas than usual.  Walking can help get rid of the air that was put into your GI tract during the procedure and reduce the bloating. If you had a lower endoscopy (such as a colonoscopy or flexible sigmoidoscopy) you may notice spotting of blood in your stool or on the toilet paper. If you underwent a bowel prep for your procedure, you may not have a normal bowel movement for a few days.  Please Note:  You might notice some irritation and congestion in your nose or some drainage.  This is from the oxygen used during your procedure.  There is no need for concern and it should clear up in a day or so.  SYMPTOMS TO REPORT IMMEDIATELY:   Following lower endoscopy (colonoscopy or flexible sigmoidoscopy):  Excessive amounts of blood in the stool  Significant tenderness or worsening of abdominal pains  Swelling of the abdomen that is new, acute  Fever of 100F or higher   For urgent or emergent issues, a gastroenterologist can be reached at any hour by calling [phone].   DIET:  We do recommend a small meal at first, but then you may proceed to your regular diet.  Drink plenty of fluids but you should avoid alcoholic beverages for 24 hours.  ACTIVITY:  You should plan to take it easy for the rest of today and you should NOT DRIVE or use heavy machinery until tomorrow (because of the sedation  medicines used during the test).    FOLLOW UP: Our staff will call the number listed on your records 48-72 hours following your procedure to check on you and address any questions or concerns that you may have regarding the information given to you following your procedure. If we do not reach you, we will leave a message.  We will attempt to reach you two times.  During this call, we will ask if you have developed any symptoms of COVID 19. If you develop any symptoms (ie: fever, flu-like symptoms, shortness of breath, cough etc.) before then, please call [phone].  If you test positive for Covid 19 in the 2 weeks post procedure, please call and report this information to us.    If any biopsies were taken you will be contacted by phone or by letter within the next 1-3 weeks.  Please call us at [phone] if you have not heard about the biopsies in 3 weeks.    SIGNATURES/CONFIDENTIALITY: You and/or your care partner have signed paperwork which will be entered into your electronic medical record.  These signatures attest to the fact that that the information above on your After Visit Summary has been reviewed and is understood.  Full responsibility of the confidentiality of this discharge information lies with you and/or your care-partner. 

## 2019-01-10 NOTE — Progress Notes (Signed)
Called to room to assist during endoscopic procedure.  Patient ID and intended procedure confirmed with present staff. Received instructions for my participation in the procedure from the performing physician.  

## 2019-01-10 NOTE — Op Note (Signed)
[facility] Patient Name: April Mcmahon Procedure Date: 01/10/2019 7:51 AM MRN: [id number] Endoscopist: Mauri Pole , MD Age: 58 Referring MD:  Date of Birth: December 01, 1960 Gender: Female Account #: [id number] Procedure:                Colonoscopy Indications:              Screening for colorectal malignant neoplasm.                            Inadequate bowel prep on last procedure Medicines:                Monitored Anesthesia Care Procedure:                Pre-Anesthesia Assessment:                           - Prior to the procedure, a History and Physical                            was performed, and patient medications and                            allergies were reviewed. The patient's tolerance of                            previous anesthesia was also reviewed. The risks                            and benefits of the procedure and the sedation                            options and risks were discussed with the patient.                            All questions were answered, and informed consent                            was obtained. Prior Anticoagulants: The patient has                            taken no previous anticoagulant or antiplatelet                            agents. ASA Grade Assessment: II - A patient with                            mild systemic disease. After reviewing the risks                            and benefits, the patient was deemed in                            satisfactory condition to undergo the procedure.  After obtaining informed consent, the colonoscope                            was passed under direct vision. Throughout the                            procedure, the patient's blood pressure, pulse, and                            oxygen saturations were monitored continuously. The                            Colonoscope was introduced through the anus and                            advanced to the the  cecum, identified by                            appendiceal orifice and ileocecal valve. The                            colonoscopy was performed without difficulty. The                            patient tolerated the procedure well. The quality                            of the bowel preparation was excellent. The                            ileocecal valve, appendiceal orifice, and rectum                            were photographed. Scope In: 8:10:48 AM Scope Out: 8:27:03 AM Scope Withdrawal Time: 0 hours 12 minutes 35 seconds  Total Procedure Duration: 0 hours 16 minutes 15 seconds  Findings:                 The perianal and digital rectal examinations were                            normal.                           A 2 mm polyp was found in the ascending colon. The                            polyp was sessile. The polyp was removed with a                            cold biopsy forceps. Resection and retrieval were                            complete.  Non-bleeding internal hemorrhoids were found during                            retroflexion. The hemorrhoids were small.                           The exam was otherwise without abnormality. Complications:            No immediate complications. Estimated Blood Loss:     Estimated blood loss was minimal. Impression:               - One 2 mm polyp in the ascending colon, removed                            with a cold biopsy forceps. Resected and retrieved.                           - Non-bleeding internal hemorrhoids.                           - The examination was otherwise normal. Recommendation:           - Patient has a contact number available for                            emergencies. The signs and symptoms of potential                            delayed complications were discussed with the                            patient. Return to normal activities tomorrow.                            Written  discharge instructions were provided to the                            patient.                           - Resume previous diet.                           - Continue present medications.                           - Await pathology results.                           - Repeat colonoscopy in 5-10 years for surveillance                            based on pathology results. Mauri Pole, MD 01/10/2019 8:34:07 AM This report has been signed electronically.

## 2019-01-10 NOTE — Progress Notes (Signed)
PT taken to PACU. Monitors in place. VSS. Report given to RN. 

## 2019-01-11 ENCOUNTER — Telehealth: Payer: Self-pay

## 2019-01-11 DIAGNOSIS — G43109 Migraine with aura, not intractable, without status migrainosus: Secondary | ICD-10-CM | POA: Diagnosis not present

## 2019-01-11 NOTE — Telephone Encounter (Signed)
Contacted pt daughter to make aware that pt form is ready for pickup and she will need to pay $10 to pick form up. Pt daughter states she understands and doesn't have any questions or concerns

## 2019-01-12 ENCOUNTER — Telehealth: Payer: Self-pay

## 2019-01-12 DIAGNOSIS — G43109 Migraine with aura, not intractable, without status migrainosus: Secondary | ICD-10-CM | POA: Diagnosis not present

## 2019-01-12 NOTE — Telephone Encounter (Signed)
Covid-19 screening questions   Do you now or have you had a fever in the last 14 days? No. Do you have any respiratory symptoms of shortness of breath or cough now or in the last 14 days? No.  Do you have any family members or close contacts with diagnosed or suspected Covid-19 in the past 14 days? No.  Have you been tested for Covid-19 and found to be positive? No.      Follow up Call-  Call back number 01/10/2019 12/12/2018  Post procedure Call Back phone  # [phone]  Permission to leave phone message Yes Yes  Some recent data might be hidden     Patient questions: 2 Do you have a fever, pain , or abdominal swelling? No. Pain Score  0 *  Have you tolerated food without any problems? Yes.    Have you been able to return to your normal activities? Yes.    Do you have any questions about your discharge instructions: Diet   No. Medications  No. Follow up visit  No.  Do you have questions or concerns about your Care? No.  Actions: * If pain score is 4 or above: No action needed, pain <4.

## 2019-01-13 DIAGNOSIS — G43109 Migraine with aura, not intractable, without status migrainosus: Secondary | ICD-10-CM | POA: Diagnosis not present

## 2019-01-14 DIAGNOSIS — G43109 Migraine with aura, not intractable, without status migrainosus: Secondary | ICD-10-CM | POA: Diagnosis not present

## 2019-01-15 DIAGNOSIS — G43109 Migraine with aura, not intractable, without status migrainosus: Secondary | ICD-10-CM | POA: Diagnosis not present

## 2019-01-16 ENCOUNTER — Other Ambulatory Visit: Payer: Self-pay

## 2019-01-16 ENCOUNTER — Ambulatory Visit: Payer: Medicaid Other | Attending: Orthopaedic Surgery | Admitting: Physical Therapy

## 2019-01-16 DIAGNOSIS — M25512 Pain in left shoulder: Secondary | ICD-10-CM | POA: Diagnosis not present

## 2019-01-16 DIAGNOSIS — M62838 Other muscle spasm: Secondary | ICD-10-CM | POA: Diagnosis not present

## 2019-01-16 DIAGNOSIS — M542 Cervicalgia: Secondary | ICD-10-CM | POA: Diagnosis not present

## 2019-01-16 DIAGNOSIS — G43109 Migraine with aura, not intractable, without status migrainosus: Secondary | ICD-10-CM | POA: Diagnosis not present

## 2019-01-16 DIAGNOSIS — G8929 Other chronic pain: Secondary | ICD-10-CM

## 2019-01-17 ENCOUNTER — Encounter: Payer: Self-pay | Admitting: Physical Therapy

## 2019-01-17 ENCOUNTER — Ambulatory Visit: Payer: Medicaid Other | Admitting: Physical Therapy

## 2019-01-17 DIAGNOSIS — G43109 Migraine with aura, not intractable, without status migrainosus: Secondary | ICD-10-CM | POA: Diagnosis not present

## 2019-01-17 NOTE — Therapy (Signed)
[place], [place], [address] Phone: [phone]   Fax:  [phone]  Physical Therapy Evaluation  Patient Details  Name: April Mcmahon MRN: [id number] Date of Birth: 05-21-1960 Referring Provider (PT): Dr Frankey Shown    Encounter Date: 01/16/2019  PT End of Session - 01/17/19 0805    Visit Number  1    Number of Visits  4    Date for PT Re-Evaluation  02/14/19    Authorization Type  MCD    PT Start Time  1545    PT Stop Time  1629    PT Time Calculation (min)  44 min    Activity Tolerance  Patient tolerated treatment well    Behavior During Therapy  [facility] for tasks assessed/performed       Past Medical History:  Diagnosis Date  . Bilateral chronic knee pain 06/2014  . Chronic low back pain 06/2014  . Chronic migraine 02/1985  . Depression    hx of  . Diabetes mellitus without complication ([place]) 123XX123  . Fall   . Hyperlipidemia   . Hypertension 09/2013  . Shoulder pain     Past Surgical History:  Procedure Laterality Date  . no previous surgeries      There were no vitals filed for this visit.   Subjective Assessment - 01/16/19 1547    Subjective  Patient has had pain in her neck and shoulder for about 1-2 months. She is also having pain in her hands. her left hand hurts worse then the right. Her shoulder pain began follwing a fall in the restrooms. She feels dizzy at times and has a heache. When she falls she can not rememebr what happens.    Currently in Pain?  Yes    Pain Score  6    faces pain scale 2nd to langauge barrier   Pain Location  Shoulder    Pain Orientation  Left    Pain Descriptors / Indicators  Aching    Pain Type  Chronic pain    Pain Radiating Towards  into the upper trap    Pain Onset  More than a month ago    Pain Frequency  Constant    Aggravating Factors   turning her head and looking up    Pain Relieving Factors  medicine    Multiple Pain Sites  Yes     Pain Score  6   faces pain scale   Pain Location  Shoulder    Pain Orientation  Left    Pain Descriptors / Indicators  Aching    Pain Type  Chronic pain    Pain Onset  More than a month ago    Pain Frequency  Intermittent    Aggravating Factors   movement of her hand    Pain Relieving Factors  pain medicine    Effect of Pain on Daily Activities  difficulty using her left arm         [facility] PT Assessment - 01/17/19 0001      Assessment   Medical Diagnosis  Left Shoulder Pain/ Cervical Pain     Referring Provider (PT)  Dr Frankey Shown     Onset Date/Surgical Date  --   2-3 months prior    Hand Dominance  Left    Prior Therapy  None       Precautions   Precautions  Fall      Restrictions   Weight Bearing Restrictions  No  Balance Screen   Has the patient fallen in the past 6 months  Yes    How many times?  --   frequent falls but can not recall how many    Has the patient had a decrease in activity level because of a fear of falling?   No    Is the patient reluctant to leave their home because of a fear of falling?   No      Prior Function   Level of Independence  Independent    Leisure  would like to work around the house. Would like to be able to take care of children       Cognition   Overall Cognitive Status  Within Functional Limits for tasks assessed    Attention  Focused    Focused Attention  Appears intact    Memory  Appears intact    Awareness  Appears intact    Problem Solving  Appears intact      Sensation   Light Touch  Appears Intact      Coordination   Gross Motor Movements are Fluid and Coordinated  Yes    Fine Motor Movements are Fluid and Coordinated  Yes      AROM   Left Shoulder Flexion  85 Degrees    Left Shoulder Internal Rotation  --   unable to reach bihind her back    Left Shoulder External Rotation  --   unable to reach behind her head    Cervical Flexion  10    Cervical Extension  10    Cervical - Right Rotation  30    Cervical  - Left Rotation  45      PROM   Overall PROM Comments  passive ROM more limited then active likely because of muscle gaurding      Strength   Overall Strength Comments  diffiult to acuretly assess 2nd to significant pain with basic movement but only able to move left shoulder against gravity 3/5       Palpation   Palpation comment  spasming and tenderness to palloation in cervical spine and down into her bilateral peri-scpaular area L> R; decreased tolerance to light touch      Special Tests   Other special tests  unbale to perfrom shoulder special tests 2nd to gaurding with all movements       Ambulation/Gait   Gait Comments  lateral movement with gait       High Level Balance   High Level Balance Comments  not tested 2nd to time                Objective measurements completed on examination: See above findings.      [place] Adult PT Treatment/Exercise - 01/17/19 0001      Neck Exercises: Seated   Other Seated Exercise  scap retraction 2x10       Neck Exercises: Stretches   Upper Trapezius Stretch  3 reps;20 seconds    Levator Stretch  2 reps;20 seconds             PT Education - 01/17/19 0804    Education Details  reviewed HEP and symptom mangement through interpreter    Methods  Explanation;Demonstration;Handout    Comprehension  Tactile cues required;Need further instruction;Verbal cues required       PT Short Term Goals - 01/17/19 0805      PT SHORT TERM GOAL #1   Title  Patient will increase bilateral cervical rotation  by 20 degrees    Time  3    Period  Weeks    Status  New    Target Date  02/07/19      PT SHORT TERM GOAL #2   Title  Patient will increase left shoulder active flexion by 20 degrees    Time  3    Period  Weeks    Status  New    Target Date  02/07/19      PT SHORT TERM GOAL #3   Title  Patient will increase rleft shoulder flexion strength to 4/5    Time  3    Period  Weeks    Status  New    Target Date  02/07/19                 Plan - 01/17/19 [id number]    Clinical Impression Statement  Patient is a 58 year old female with a 2-3 month history of neck, left shoulder, and hand pain following a fall. She has significant spasming of her bilateral upper traps and cervical paraspinals. she has limite dmotion of her cervical spine worse with left rotation and extension. She has limited active motion of her left shoulder. Sheis very gaurded with passive motion of her shoulder. She reports she is left handed. She can not recall why she falls. Therapy will assess balance next visit. She also reports back and left foot pain. She owuld benefit from skilled therapy to reduce spasming and improve patients ability to perfrom ADL's. She may also require balance training 2nd to frequent falls.    Personal Factors and Comorbidities  Comorbidity 1;Comorbidity 2;Comorbidity 3+;Fitness    Comorbidities  chronic migrane, Low back pain, bilateral knee pain, falls    Examination-Activity Limitations  Locomotion Level;Squat;Lift;Dressing;Carry;Sleep    Examination-Participation Restrictions  Cleaning;Community Activity;Other   taking car eof children   Stability/Clinical Decision Making  Evolving/Moderate complexity   decreasing function of her left hand   Clinical Decision Making  Moderate    Rehab Potential  Fair   multiple areas of pain and decreased apparent understanding of why she is at therapy.   PT Frequency  1x / week    PT Duration  3 weeks    PT Treatment/Interventions  ADLs/Self Care Home Management;Cryotherapy;Electrical Stimulation;Iontophoresis 4mg /ml Dexamethasone;Ultrasound;DME Instruction;Therapeutic activities;Functional mobility training;Therapeutic exercise;Patient/family education;Manual techniques;Neuromuscular re-education;Passive range of motion;Taping;Dry needling    PT Next Visit Plan  begin with light manual therapy if tolerated. The patient is very gaurded. She had less passive motion of her shoulder  then active motion. She has spasming in her cervical spiane and into her peri-scpaular areas. She also reported low back pain but her script was just for her neck , shoulders, and hands. She required a lot of cuing to just put her hand under her leg for upper trap stretch so it might be better to just focus on her main problem at this point. begin scpaular sstrengthening as tolerated. It may be best to give her 1-2 exercises for home at a time. consdier table slide, work on helping patient find her pain freee ranges with her neck. With testing she was forcing her neck into painful ranges depsite max cuing. Do rhomberg for balance assessment    PT Home Exercise Plan  upper trap stretch; levaotr stretch; scpa retraction max cuing for tehcnique required for all    Consulted and Agree with Plan of Care  Patient       Patient will benefit from skilled therapeutic intervention  in order to improve the following deficits and impairments:  Decreased activity tolerance, Decreased range of motion, Decreased safety awareness, Decreased strength, Increased fascial restricitons, Increased muscle spasms, Impaired UE functional use, Impaired sensation, Improper body mechanics, Postural dysfunction, Obesity, Pain  Visit Diagnosis: Cervicalgia  Chronic left shoulder pain  Other muscle spasm     Problem List Patient Active Problem List   Diagnosis Date Noted  . Microalbuminuria 11/15/2018  . Muscle cramps 02/21/2018  . Hepatic steatosis 12/23/2017  . MCI (mild cognitive impairment) 02/14/2017  . Diabetic peripheral neuropathy ([place]) 02/14/2017  . Memory loss 12/27/2016  . Dysfunctional uterine bleeding 08/30/2016  . Analgesic rebound headache 06/24/2016  . Esophageal dysphagia 06/21/2016  . Tobacco dependence 01/20/2016  . Plantar fasciitis, bilateral 12/24/2015  . 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 Diabetes type 2, controlled ([place]) 06/19/2015  . Essential hypertension 06/19/2015   . Vitamin D deficiency 09/24/2013  . Hyperlipidemia 09/24/2013  . Migraine 05/15/2013  . Low back pain 08/18/2012    Carney Living PT DPT  01/17/2019, 2:38 PM  [facility] [address] [place], [place], [address] Phone: [phone]   Fax:  [phone]  Name: April Mcmahon MRN: [id number] Date of Birth: 04-Feb-1960

## 2019-01-18 DIAGNOSIS — G43109 Migraine with aura, not intractable, without status migrainosus: Secondary | ICD-10-CM | POA: Diagnosis not present

## 2019-01-19 ENCOUNTER — Ambulatory Visit: Payer: Medicaid Other | Admitting: Internal Medicine

## 2019-01-19 DIAGNOSIS — G43109 Migraine with aura, not intractable, without status migrainosus: Secondary | ICD-10-CM | POA: Diagnosis not present

## 2019-01-20 DIAGNOSIS — G43109 Migraine with aura, not intractable, without status migrainosus: Secondary | ICD-10-CM | POA: Diagnosis not present

## 2019-01-21 DIAGNOSIS — G43109 Migraine with aura, not intractable, without status migrainosus: Secondary | ICD-10-CM | POA: Diagnosis not present

## 2019-01-22 DIAGNOSIS — G43109 Migraine with aura, not intractable, without status migrainosus: Secondary | ICD-10-CM | POA: Diagnosis not present

## 2019-01-23 DIAGNOSIS — G43109 Migraine with aura, not intractable, without status migrainosus: Secondary | ICD-10-CM | POA: Diagnosis not present

## 2019-01-24 ENCOUNTER — Encounter: Payer: Self-pay | Admitting: Gastroenterology

## 2019-01-24 ENCOUNTER — Other Ambulatory Visit: Payer: Self-pay

## 2019-01-24 ENCOUNTER — Encounter: Payer: Self-pay | Admitting: Physical Medicine and Rehabilitation

## 2019-01-24 ENCOUNTER — Ambulatory Visit (INDEPENDENT_AMBULATORY_CARE_PROVIDER_SITE_OTHER): Payer: Medicaid Other | Admitting: Physical Medicine and Rehabilitation

## 2019-01-24 DIAGNOSIS — R202 Paresthesia of skin: Secondary | ICD-10-CM | POA: Diagnosis not present

## 2019-01-24 DIAGNOSIS — G43109 Migraine with aura, not intractable, without status migrainosus: Secondary | ICD-10-CM | POA: Diagnosis not present

## 2019-01-24 NOTE — Progress Notes (Signed)
April Mcmahon - 58 y.o. female MRN [id number]  Date of birth: 1960-10-06  Office Visit Note: Visit Date: 01/24/2019 PCP: Ladell Pier, MD Referred by: Ladell Pier, MD  Subjective: Chief Complaint  Patient presents with  . Left Shoulder - Pain  . Left Arm - Pain  . Left Hand - Numbness, Tingling  . Right Hand - Tingling, Numbness   HPI: April Mcmahon is a 58 y.o. female who comes in today At the request of Dr. Jerilynn Som for electrodiagnostic studies of both upper limbs.  Patient is left-hand dominant with left more than right hand pain with numbness and tingling in all the digits in a nondermatomal global fashion.  She does get left shoulder and arm pain more than right arm pain.  Some issues with the neck in general.  She reports worsening with using her hands and lifting.  She reports that bracing has helped to some degree as well as the pain medication.  Her pain is a 6 out of 10.  She is a diabetic with her last hemoglobin A1c of 8.1 in October of this year.  Her level is a bit above 7.5 for some time.  She does get some numbness and tingling in the feet.  She does have interpreter present with her today.    ROS Otherwise per HPI.  Assessment & Plan: Visit Diagnoses:  1. Paresthesia of skin     Plan: Impression: The above electrodiagnostic study is ABNORMAL and reveals evidence of a VERY mild bilateral median nerve entrapment at the wrist (carpal tunnel syndrome) affecting sensory components. **This could represent technical or temperature artifact.    There is no significant electrodiagnostic evidence of any other focal nerve entrapment, brachial plexopathy, cervical radiculopathy or generalized peripheral neuropathy.    **This electrodiagnostic study cannot rule out small fiber polyneuropathy and dysesthesias from central pain syndromes such as stroke or central pain sensitization syndromes such as fibromyalgia.  Myotomal referral pain from trigger points is  also not excluded.  Recommendations: 1.  Follow-up with referring physician. 2.  Continue current management of symptoms  Meds & Orders: No orders of the defined types were placed in this encounter.   Orders Placed This Encounter  Procedures  . NCV with EMG (electromyography)    Follow-up: Return in about 2 weeks (around 02/07/2019) for Eduard Roux, MD.   Procedures: No procedures performed  EMG & NCV Findings: Evaluation of the left median (across palm) sensory nerve showed prolonged distal peak latency (Wrist, 3.8 ms).  The right median (across palm) sensory nerve showed no response (Palm).  All remaining nerves (as indicated in the following tables) were within normal limits.  All left vs. right side differences were within normal limits.    All examined muscles (as indicated in the following table) showed no evidence of electrical instability.    Impression: The above electrodiagnostic study is ABNORMAL and reveals evidence of a VERY mild bilateral median nerve entrapment at the wrist (carpal tunnel syndrome) affecting sensory components. **This could represent technical or temperature artifact.    There is no significant electrodiagnostic evidence of any other focal nerve entrapment, brachial plexopathy, cervical radiculopathy or generalized peripheral neuropathy.    **This electrodiagnostic study cannot rule out small fiber polyneuropathy and dysesthesias from central pain syndromes such as stroke or central pain sensitization syndromes such as fibromyalgia.  Myotomal referral pain from trigger points is also not excluded.  Recommendations: 1.  Follow-up with referring physician. 2.  Continue  current management of symptoms.  ___________________________ Laurence Spates FAAPMR Board Certified, American Board of Physical Medicine and Rehabilitation    Nerve Conduction Studies Anti Sensory Summary Table   Stim Site NR Peak (ms) Norm Peak (ms) P-T Amp (V) Norm P-T Amp Site1 Site2  Delta-P (ms) Dist (cm) Vel (m/s) Norm Vel (m/s)  Left Median Acr Palm Anti Sensory (2nd Digit)  31.3C  Wrist    *3.8 <3.6 33.5 >10 Wrist Palm 2.2 0.0    Palm    1.6 <2.0 10.7         Right Median Acr Palm Anti Sensory (2nd Digit)  31.6C  Wrist    3.5 <3.6 42.8 >10 Wrist Palm  0.0    Palm *NR  <2.0          Left Radial Anti Sensory (Base 1st Digit)  31.5C  Wrist    2.0 <3.1 20.4  Wrist Base 1st Digit 2.0 10.0 50   Right Radial Anti Sensory (Base 1st Digit)  32.1C  Wrist    2.0 <3.1 22.7  Wrist Base 1st Digit 2.0 0.0    Left Ulnar Anti Sensory (5th Digit)  31.5C  Wrist    3.1 <3.7 27.9 >15.0 Wrist 5th Digit 3.1 14.0 45 >38  Right Ulnar Anti Sensory (5th Digit)  31.9C  Wrist    2.9 <3.7 22.6 >15.0 Wrist 5th Digit 2.9 14.0 48 >38   Motor Summary Table   Stim Site NR Onset (ms) Norm Onset (ms) O-P Amp (mV) Norm O-P Amp Site1 Site2 Delta-0 (ms) Dist (cm) Vel (m/s) Norm Vel (m/s)  Left Median Motor (Abd Poll Brev)  31.5C  Wrist    4.1 <4.2 9.7 >5 Elbow Wrist 3.2 16.5 52 >50  Elbow    7.3  7.9         Right Median Motor (Abd Poll Brev)  32.2C  Wrist    3.4 <4.2 8.6 >5 Elbow Wrist 2.9 16.0 55 >50  Elbow    6.3  6.9         Left Ulnar Motor (Abd Dig Min)  31.5C  Wrist    2.9 <4.2 11.2 >3 B Elbow Wrist 2.5 16.0 64 >53  B Elbow    5.4  11.1  A Elbow B Elbow 1.2 9.0 75 >53  A Elbow    6.6  10.6         Right Ulnar Motor (Abd Dig Min)  32.2C  Wrist    2.7 <4.2 8.6 >3 B Elbow Wrist 2.3 16.0 70 >53  B Elbow    5.0  9.1  A Elbow B Elbow 1.1 9.0 82 >53  A Elbow    6.1  8.8          EMG   Side Muscle Nerve Root Ins Act Fibs Psw Amp Dur Poly Recrt Int [place] Din Comment  Left 1stDorInt Ulnar C8-T1 Nml Nml Nml Nml Nml 0 Nml Nml   Left Abd Poll Brev Median C8-T1 Nml Nml Nml Nml Nml 0 Nml Nml   Left ExtDigCom   Nml Nml Nml Nml Nml 0 Nml Nml   Left Triceps Radial C6-7-8 Nml Nml Nml Nml Nml 0 Nml Nml   Left Deltoid Axillary C5-6 Nml Nml Nml Nml Nml 0 Nml Nml     Nerve Conduction Studies Anti  Sensory Left/Right Comparison   Stim Site L Lat (ms) R Lat (ms) L-R Lat (ms) L Amp (V) R Amp (V) L-R Amp (%) Site1 Site2 L Vel (m/s) R Vel (  m/s) L-R Vel (m/s)  Median Acr Palm Anti Sensory (2nd Digit)  31.3C  Wrist *3.8 3.5 0.3 33.5 42.8 21.7 Wrist Palm     Palm 1.6   10.7         Radial Anti Sensory (Base 1st Digit)  31.5C  Wrist 2.0 2.0 0.0 20.4 22.7 10.1 Wrist Base 1st Digit 50    Ulnar Anti Sensory (5th Digit)  31.5C  Wrist 3.1 2.9 0.2 27.9 22.6 19.0 Wrist 5th Digit 45 48 3   Motor Left/Right Comparison   Stim Site L Lat (ms) R Lat (ms) L-R Lat (ms) L Amp (mV) R Amp (mV) L-R Amp (%) Site1 Site2 L Vel (m/s) R Vel (m/s) L-R Vel (m/s)  Median Motor (Abd Poll Brev)  31.5C  Wrist 4.1 3.4 0.7 9.7 8.6 11.3 Elbow Wrist 52 55 3  Elbow 7.3 6.3 1.0 7.9 6.9 12.7       Ulnar Motor (Abd Dig Min)  31.5C  Wrist 2.9 2.7 0.2 11.2 8.6 23.2 B Elbow Wrist 64 70 6  B Elbow 5.4 5.0 0.4 11.1 9.1 18.0 A Elbow B Elbow 75 82 7  A Elbow 6.6 6.1 0.5 10.6 8.8 17.0          Waveforms:                      Clinical History: No specialty comments available.   She reports that she has quit smoking. She has never used smokeless tobacco.  Recent Labs    08/11/18 1530 11/13/18 0954  HGBA1C 7.8* 8.1*    Objective:  VS:  HT:    WT:   BMI:     BP:   HR: bpm  TEMP: ( )  RESP:  Physical Exam Musculoskeletal:        General: No swelling, tenderness or deformity.     Comments: Inspection reveals no atrophy of the bilateral APB or FDI or hand intrinsics. There is no swelling, color changes, allodynia or dystrophic changes. There is 5 out of 5 strength in the bilateral wrist extension, finger abduction and long finger flexion. There is intact sensation to light touch in all dermatomal and peripheral nerve distributions.  There is a negative Hoffmann's test bilaterally.  Skin:    General: Skin is warm and dry.     Findings: No erythema or rash.  Neurological:     General: No focal deficit  present.     Mental Status: She is alert and oriented to person, place, and time.     Motor: No weakness or abnormal muscle tone.     Coordination: Coordination normal.  Psychiatric:        Mood and Affect: Mood normal.        Behavior: Behavior normal.     Ortho Exam Imaging: No results found.  Past Medical/Family/Surgical/Social History: Medications & Allergies reviewed per EMR, new medications updated. Patient Active Problem List   Diagnosis Date Noted  . Microalbuminuria 11/15/2018  . Muscle cramps 02/21/2018  . Hepatic steatosis 12/23/2017  . MCI (mild cognitive impairment) 02/14/2017  . Diabetic peripheral neuropathy ([place]) 02/14/2017  . Memory loss 12/27/2016  . Dysfunctional uterine bleeding 08/30/2016  . Analgesic rebound headache 06/24/2016  . Esophageal dysphagia 06/21/2016  . Tobacco dependence 01/20/2016  . Plantar fasciitis, bilateral 12/24/2015  . 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 Diabetes type 2, controlled ([place]) 06/19/2015  . Essential hypertension 06/19/2015  . Vitamin D deficiency 09/24/2013  . Hyperlipidemia  09/24/2013  . Migraine 05/15/2013  . Low back pain 08/18/2012   Past Medical History:  Diagnosis Date  . Bilateral chronic knee pain 06/2014  . Chronic low back pain 06/2014  . Chronic migraine 02/1985  . Depression    hx of  . Diabetes mellitus without complication ([place]) 123XX123  . Fall   . Hyperlipidemia   . Hypertension 09/2013  . Shoulder pain    Family History  Problem Relation Age of Onset  . Migraines Mother   . Esophageal cancer Other   . Breast cancer Neg Hx   . Colon cancer Neg Hx   . Rectal cancer Neg Hx   . Stomach cancer Neg Hx    Past Surgical History:  Procedure Laterality Date  . no previous surgeries     Social History   Occupational History  . Not on file  Tobacco Use  . Smoking status: Former Research scientist (life sciences)  . Smokeless tobacco: Never Used  Substance and Sexual Activity  . Alcohol use: No  .  Drug use: No  . Sexual activity: Not on file

## 2019-01-24 NOTE — Progress Notes (Signed)
 .  Numeric Pain Rating Scale and Functional Assessment Average Pain 6   In the last MONTH (on 0-10 scale) has pain interfered with the following?  1. General activity like being  able to carry out your everyday physical activities such as walking, climbing stairs, carrying groceries, or moving a chair?  Rating(7)

## 2019-01-25 DIAGNOSIS — G43109 Migraine with aura, not intractable, without status migrainosus: Secondary | ICD-10-CM | POA: Diagnosis not present

## 2019-01-26 DIAGNOSIS — G43109 Migraine with aura, not intractable, without status migrainosus: Secondary | ICD-10-CM | POA: Diagnosis not present

## 2019-01-27 DIAGNOSIS — G43109 Migraine with aura, not intractable, without status migrainosus: Secondary | ICD-10-CM | POA: Diagnosis not present

## 2019-01-28 DIAGNOSIS — G43109 Migraine with aura, not intractable, without status migrainosus: Secondary | ICD-10-CM | POA: Diagnosis not present

## 2019-01-29 ENCOUNTER — Ambulatory Visit: Payer: Medicaid Other | Admitting: Physical Therapy

## 2019-01-29 DIAGNOSIS — G43109 Migraine with aura, not intractable, without status migrainosus: Secondary | ICD-10-CM | POA: Diagnosis not present

## 2019-01-30 DIAGNOSIS — G43109 Migraine with aura, not intractable, without status migrainosus: Secondary | ICD-10-CM | POA: Diagnosis not present

## 2019-01-31 DIAGNOSIS — G43109 Migraine with aura, not intractable, without status migrainosus: Secondary | ICD-10-CM | POA: Diagnosis not present

## 2019-02-01 DIAGNOSIS — G43109 Migraine with aura, not intractable, without status migrainosus: Secondary | ICD-10-CM | POA: Diagnosis not present

## 2019-02-02 DIAGNOSIS — G43109 Migraine with aura, not intractable, without status migrainosus: Secondary | ICD-10-CM | POA: Diagnosis not present

## 2019-02-03 DIAGNOSIS — G43109 Migraine with aura, not intractable, without status migrainosus: Secondary | ICD-10-CM | POA: Diagnosis not present

## 2019-02-04 DIAGNOSIS — G43109 Migraine with aura, not intractable, without status migrainosus: Secondary | ICD-10-CM | POA: Diagnosis not present

## 2019-02-05 DIAGNOSIS — G43109 Migraine with aura, not intractable, without status migrainosus: Secondary | ICD-10-CM | POA: Diagnosis not present

## 2019-02-05 NOTE — Procedures (Signed)
EMG & NCV Findings: Evaluation of the left median (across palm) sensory nerve showed prolonged distal peak latency (Wrist, 3.8 ms).  The right median (across palm) sensory nerve showed no response (Palm).  All remaining nerves (as indicated in the following tables) were within normal limits.  All left vs. right side differences were within normal limits.    All examined muscles (as indicated in the following table) showed no evidence of electrical instability.    Impression: The above electrodiagnostic study is ABNORMAL and reveals evidence of a VERY mild bilateral median nerve entrapment at the wrist (carpal tunnel syndrome) affecting sensory components. **This could represent technical or temperature artifact.    There is no significant electrodiagnostic evidence of any other focal nerve entrapment, brachial plexopathy, cervical radiculopathy or generalized peripheral neuropathy.    **This electrodiagnostic study cannot rule out small fiber polyneuropathy and dysesthesias from central pain syndromes such as stroke or central pain sensitization syndromes such as fibromyalgia.  Myotomal referral pain from trigger points is also not excluded.  Recommendations: 1.  Follow-up with referring physician. 2.  Continue current management of symptoms.  ___________________________ Laurence Spates FAAPMR Board Certified, American Board of Physical Medicine and Rehabilitation    Nerve Conduction Studies Anti Sensory Summary Table   Stim Site NR Peak (ms) Norm Peak (ms) P-T Amp (V) Norm P-T Amp Site1 Site2 Delta-P (ms) Dist (cm) Vel (m/s) Norm Vel (m/s)  Left Median Acr Palm Anti Sensory (2nd Digit)  31.3C  Wrist    *3.8 <3.6 33.5 >10 Wrist Palm 2.2 0.0    Palm    1.6 <2.0 10.7         Right Median Acr Palm Anti Sensory (2nd Digit)  31.6C  Wrist    3.5 <3.6 42.8 >10 Wrist Palm  0.0    Palm *NR  <2.0          Left Radial Anti Sensory (Base 1st Digit)  31.5C  Wrist    2.0 <3.1 20.4  Wrist Base 1st  Digit 2.0 10.0 50   Right Radial Anti Sensory (Base 1st Digit)  32.1C  Wrist    2.0 <3.1 22.7  Wrist Base 1st Digit 2.0 0.0    Left Ulnar Anti Sensory (5th Digit)  31.5C  Wrist    3.1 <3.7 27.9 >15.0 Wrist 5th Digit 3.1 14.0 45 >38  Right Ulnar Anti Sensory (5th Digit)  31.9C  Wrist    2.9 <3.7 22.6 >15.0 Wrist 5th Digit 2.9 14.0 48 >38   Motor Summary Table   Stim Site NR Onset (ms) Norm Onset (ms) O-P Amp (mV) Norm O-P Amp Site1 Site2 Delta-0 (ms) Dist (cm) Vel (m/s) Norm Vel (m/s)  Left Median Motor (Abd Poll Brev)  31.5C  Wrist    4.1 <4.2 9.7 >5 Elbow Wrist 3.2 16.5 52 >50  Elbow    7.3  7.9         Right Median Motor (Abd Poll Brev)  32.2C  Wrist    3.4 <4.2 8.6 >5 Elbow Wrist 2.9 16.0 55 >50  Elbow    6.3  6.9         Left Ulnar Motor (Abd Dig Min)  31.5C  Wrist    2.9 <4.2 11.2 >3 B Elbow Wrist 2.5 16.0 64 >53  B Elbow    5.4  11.1  A Elbow B Elbow 1.2 9.0 75 >53  A Elbow    6.6  10.6         Right Ulnar Motor (  Abd Dig Min)  32.2C  Wrist    2.7 <4.2 8.6 >3 B Elbow Wrist 2.3 16.0 70 >53  B Elbow    5.0  9.1  A Elbow B Elbow 1.1 9.0 82 >53  A Elbow    6.1  8.8          EMG   Side Muscle Nerve Root Ins Act Fibs Psw Amp Dur Poly Recrt Int Fraser Din Comment  Left 1stDorInt Ulnar C8-T1 Nml Nml Nml Nml Nml 0 Nml Nml   Left Abd Poll Brev Median C8-T1 Nml Nml Nml Nml Nml 0 Nml Nml   Left ExtDigCom   Nml Nml Nml Nml Nml 0 Nml Nml   Left Triceps Radial C6-7-8 Nml Nml Nml Nml Nml 0 Nml Nml   Left Deltoid Axillary C5-6 Nml Nml Nml Nml Nml 0 Nml Nml     Nerve Conduction Studies Anti Sensory Left/Right Comparison   Stim Site L Lat (ms) R Lat (ms) L-R Lat (ms) L Amp (V) R Amp (V) L-R Amp (%) Site1 Site2 L Vel (m/s) R Vel (m/s) L-R Vel (m/s)  Median Acr Palm Anti Sensory (2nd Digit)  31.3C  Wrist *3.8 3.5 0.3 33.5 42.8 21.7 Wrist Palm     Palm 1.6   10.7         Radial Anti Sensory (Base 1st Digit)  31.5C  Wrist 2.0 2.0 0.0 20.4 22.7 10.1 Wrist Base 1st Digit 50    Ulnar Anti  Sensory (5th Digit)  31.5C  Wrist 3.1 2.9 0.2 27.9 22.6 19.0 Wrist 5th Digit 45 48 3   Motor Left/Right Comparison   Stim Site L Lat (ms) R Lat (ms) L-R Lat (ms) L Amp (mV) R Amp (mV) L-R Amp (%) Site1 Site2 L Vel (m/s) R Vel (m/s) L-R Vel (m/s)  Median Motor (Abd Poll Brev)  31.5C  Wrist 4.1 3.4 0.7 9.7 8.6 11.3 Elbow Wrist 52 55 3  Elbow 7.3 6.3 1.0 7.9 6.9 12.7       Ulnar Motor (Abd Dig Min)  31.5C  Wrist 2.9 2.7 0.2 11.2 8.6 23.2 B Elbow Wrist 64 70 6  B Elbow 5.4 5.0 0.4 11.1 9.1 18.0 A Elbow B Elbow 75 82 7  A Elbow 6.6 6.1 0.5 10.6 8.8 17.0          Waveforms:

## 2019-02-06 ENCOUNTER — Ambulatory Visit: Payer: Medicaid Other | Attending: Orthopaedic Surgery | Admitting: Physical Therapy

## 2019-02-06 ENCOUNTER — Ambulatory Visit: Payer: Medicaid Other | Admitting: Physical Therapy

## 2019-02-06 ENCOUNTER — Other Ambulatory Visit: Payer: Self-pay

## 2019-02-06 DIAGNOSIS — M62838 Other muscle spasm: Secondary | ICD-10-CM

## 2019-02-06 DIAGNOSIS — G43109 Migraine with aura, not intractable, without status migrainosus: Secondary | ICD-10-CM | POA: Diagnosis not present

## 2019-02-06 DIAGNOSIS — M542 Cervicalgia: Secondary | ICD-10-CM

## 2019-02-06 DIAGNOSIS — M25512 Pain in left shoulder: Secondary | ICD-10-CM | POA: Insufficient documentation

## 2019-02-06 DIAGNOSIS — G8929 Other chronic pain: Secondary | ICD-10-CM

## 2019-02-06 NOTE — Therapy (Signed)
[place], [place], [address] Phone: [phone]   Fax:  [phone]  Physical Therapy Treatment  Patient Details  Name: April Mcmahon MRN: [id number] Date of Birth: 1960-06-17 Referring Provider (PT): Dr Frankey Shown    Encounter Date: 02/06/2019  PT End of Session - 02/06/19 1154    Visit Number  2    Number of Visits  4    Date for PT Re-Evaluation  02/14/19    Authorization Type  MCD    Authorization Time Period  02/05/19-02/25/19    Authorization - Visit Number  1    Authorization - Number of Visits  3    PT Start Time  [id number]    PT Stop Time  1230    PT Time Calculation (min)  45 min       Past Medical History:  Diagnosis Date  . Bilateral chronic knee pain 06/2014  . Chronic low back pain 06/2014  . Chronic migraine 02/1985  . Depression    hx of  . Diabetes mellitus without complication ([place]) 123XX123  . Fall   . Hyperlipidemia   . Hypertension 09/2013  . Shoulder pain     Past Surgical History:  Procedure Laterality Date  . no previous surgeries      There were no vitals filed for this visit.  Subjective Assessment - 02/06/19 1151    Subjective  I have neck pain and shoulder pain. Also back pain. The pain is the same. I took medicine and it makes it feel better. I am doing the exercise but it only helpd for s short period.    Currently in Pain?  Yes    Pain Score  6     Pain Location  Neck    Pain Orientation  Left    Pain Descriptors / Indicators  Aching    Aggravating Factors   turning head, looking up    Pain Relieving Factors  meds    Pain Score  6    Pain Location  Shoulder    Pain Orientation  Left    Pain Descriptors / Indicators  Aching    Aggravating Factors   using arm    Pain Relieving Factors  pain meds         [facility] PT Assessment - 02/06/19 0001      AROM   Left Shoulder Flexion  125 Degrees    Left Shoulder ABduction  115 Degrees                    [facility] Adult PT Treatment/Exercise - 02/06/19 0001      Neck Exercises: Seated   Other Seated Exercise  scap retraction 2x10     Other Seated Exercise  Chin tuck x 10       Neck Exercises: Supine   Neck Retraction  10 reps    Other Supine Exercise  scap retract into mat   x 10       Shoulder Exercises: Supine   Other Supine Exercises  Supine chest press with dowel, pullovers , ER @ 90 with dowel bilat    cues for comfortable ROM     Manual Therapy   Manual Therapy  Passive ROM    Passive ROM  Left shoulder all planes - increased clavicle pain with flexion and IR       Neck Exercises: Stretches   Levator Stretch  --   AROM , 5 seconds  Other Neck Stretches  AROM Side bend x 10, cervical rotation x 10 , Cervical extensio and felxion AROM x 10                PT Short Term Goals - 01/17/19 0805      PT SHORT TERM GOAL #1   Title  Patient will increase bilateral cervical rotation by 20 degrees    Time  3    Period  Weeks    Status  New    Target Date  02/07/19      PT SHORT TERM GOAL #2   Title  Patient will increase left shoulder active flexion by 20 degrees    Time  3    Period  Weeks    Status  New    Target Date  02/07/19      PT SHORT TERM GOAL #3   Title  Patient will increase rleft shoulder flexion strength to 4/5    Time  3    Period  Weeks    Status  New    Target Date  02/07/19               Plan - 02/06/19 1258    Clinical Impression Statement  Pt reports pain unchanged and HEP helps briefly. Reviewed HEP and instructed her in Neck Arom and scap retraction. Her cervical AROM is visually improved since eval. Her left  shoulder PROM is improved. Added supine shoulder dowel exercises. She reports increased bilateral clavicle pain as only area of pain. She is tender to palpation along bilateral distal > proximal clavicle. She felt better after session with c/o clavicle pain only.    PT Next Visit Plan  address  clavicle pain, possibly KT tape? clavicle mobs? scalenes stretch? (xrays negative) ;                      begin with light manual therapy if tolerated. The patient is very gaurded. She had less passive motion of her shoulder then active motion. She has spasming in her cervical spiane and into her peri-scpaular areas. She also reported low back pain but her script was just for her neck , shoulders, and hands. She required a lot of cuing to just put her hand under her leg for upper trap stretch so it might be better to just focus on her main problem at this point. begin scpaular sstrengthening as tolerated. It may be best to give her 1-2 exercises for home at a time. consdier table slide, work on helping patient find her pain freee ranges with her neck. With testing she was forcing her neck into painful ranges depsite max cuing. Do rhomberg for balance assessment    PT Home Exercise Plan  upper trap stretch; levaotr stretch; scpa retraction max cuing for tehcnique required for all       Patient will benefit from skilled therapeutic intervention in order to improve the following deficits and impairments:  Decreased activity tolerance, Decreased range of motion, Decreased safety awareness, Decreased strength, Increased fascial restricitons, Increased muscle spasms, Impaired UE functional use, Impaired sensation, Improper body mechanics, Postural dysfunction, Obesity, Pain  Visit Diagnosis: Cervicalgia  Chronic left shoulder pain  Other muscle spasm     Problem List Patient Active Problem List   Diagnosis Date Noted  . Microalbuminuria 11/15/2018  . Muscle cramps 02/21/2018  . Hepatic steatosis 12/23/2017  . MCI (mild cognitive impairment) 02/14/2017  . Diabetic peripheral neuropathy ([place]) 02/14/2017  . Memory  loss 12/27/2016  . Dysfunctional uterine bleeding 08/30/2016  . Analgesic rebound headache 06/24/2016  . Esophageal dysphagia 06/21/2016  . Tobacco dependence 01/20/2016  . Plantar  fasciitis, bilateral 12/24/2015  . 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 Diabetes type 2, controlled ([place]) 06/19/2015  . Essential hypertension 06/19/2015  . Vitamin D deficiency 09/24/2013  . Hyperlipidemia 09/24/2013  . Migraine 05/15/2013  . Low back pain 08/18/2012    Dorene Ar, PTA 02/06/2019, 1:09 PM  [facility] [address] [place], [place], [address] Phone: [phone]   Fax:  [phone]  Name: April Mcmahon MRN: [id number] Date of Birth: Jan 24, 1961

## 2019-02-07 ENCOUNTER — Encounter: Payer: Self-pay | Admitting: Orthopaedic Surgery

## 2019-02-07 ENCOUNTER — Ambulatory Visit (INDEPENDENT_AMBULATORY_CARE_PROVIDER_SITE_OTHER): Payer: Medicaid Other | Admitting: Orthopaedic Surgery

## 2019-02-07 DIAGNOSIS — M79642 Pain in left hand: Secondary | ICD-10-CM

## 2019-02-07 DIAGNOSIS — G43109 Migraine with aura, not intractable, without status migrainosus: Secondary | ICD-10-CM | POA: Diagnosis not present

## 2019-02-07 DIAGNOSIS — M79641 Pain in right hand: Secondary | ICD-10-CM

## 2019-02-07 NOTE — Progress Notes (Signed)
Office Visit Note   Patient: April Mcmahon           Date of Birth: September 29, 1960           MRN: [id number] Visit Date: 02/07/2019              Requested by: Ladell Pier, MD [address] [place],  [place] [address] PCP: Ladell Pier, MD   Assessment & Plan: Visit Diagnoses:  1. Bilateral hand pain     Plan: Impression is very mild bilateral carpal tunnel syndrome.  Due to the patient's underlying diabetes we will inject just the left carpal tunnel today.  If she notes relief of symptoms following the injection, she will follow up with [name] in 1 to 2 weeks for her right carpal tunnel injection.  Otherwise, follow-up with [name] as needed.  Follow-Up Instructions: Return if symptoms worsen or fail to improve.   Orders:  Orders Placed This Encounter  Procedures  . Hand/UE Inj: L carpal tunnel   No orders of the defined types were placed in this encounter.     Procedures: Hand/UE Inj: L carpal tunnel for carpal tunnel syndrome on 02/07/2019 3:40 PM Indications: pain Details: 25 G needle, volar approach Medications: 1 mL lidocaine 1 %; 0.33 mL bupivacaine 0.25 %; 13.33 mg methylPREDNISolone acetate 40 MG/ML      Clinical Data: No additional findings.   Subjective: Chief Complaint  Patient presents with  . Right Hand - Follow-up  . Left Hand - Follow-up    HPI patient is a pleasant 59 year old female who comes in today to discuss nerve conduction studies bilateral upper extremities.  She is here with an interpreter.  Bilateral upper extremity nerve conduction studies show very mild bilateral carpal tunnel syndrome.  Patient notes she has been wearing bilateral wrist splints at night with fairly good relief of symptoms.  She does continue to notice numbness and tingling to all of her fingers.     Objective: Vital Signs: There were no vitals taken for this visit.    Ortho Exam stable exam of bot wrist.  Specialty Comments:  No specialty comments  available.  Imaging: No new imaging   PMFS History: Patient Active Problem List   Diagnosis Date Noted  . Microalbuminuria 11/15/2018  . Muscle cramps 02/21/2018  . Hepatic steatosis 12/23/2017  . MCI (mild cognitive impairment) 02/14/2017  . Diabetic peripheral neuropathy ([place]) 02/14/2017  . Memory loss 12/27/2016  . Dysfunctional uterine bleeding 08/30/2016  . Analgesic rebound headache 06/24/2016  . Esophageal dysphagia 06/21/2016  . Tobacco dependence 01/20/2016  . Plantar fasciitis, bilateral 12/24/2015  . 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 Diabetes type 2, controlled ([place]) 06/19/2015  . Essential hypertension 06/19/2015  . Vitamin D deficiency 09/24/2013  . Hyperlipidemia 09/24/2013  . Migraine 05/15/2013  . Low back pain 08/18/2012   Past Medical History:  Diagnosis Date  . Bilateral chronic knee pain 06/2014  . Chronic low back pain 06/2014  . Chronic migraine 02/1985  . Depression    hx of  . Diabetes mellitus without complication ([place]) 123XX123  . Fall   . Hyperlipidemia   . Hypertension 09/2013  . Shoulder pain     Family History  Problem Relation Age of Onset  . Migraines Mother   . Esophageal cancer Other   . Breast cancer Neg Hx   . Colon cancer Neg Hx   . Rectal cancer Neg Hx   . Stomach cancer Neg Hx  Past Surgical History:  Procedure Laterality Date  . no previous surgeries     Social History   Occupational History  . Not on file  Tobacco Use  . Smoking status: Former Research scientist (life sciences)  . Smokeless tobacco: Never Used  Substance and Sexual Activity  . Alcohol use: No  . Drug use: No  . Sexual activity: Not on file

## 2019-02-08 DIAGNOSIS — G43109 Migraine with aura, not intractable, without status migrainosus: Secondary | ICD-10-CM | POA: Diagnosis not present

## 2019-02-08 MED ORDER — METHYLPREDNISOLONE ACETATE 40 MG/ML IJ SUSP
13.3300 mg | INTRAMUSCULAR | Status: AC | PRN
  Administered 2019-02-07: 16:00:00 13.33 mg

## 2019-02-08 MED ORDER — LIDOCAINE HCL 1 % IJ SOLN
1.0000 mL | INTRAMUSCULAR | Status: AC | PRN
  Administered 2019-02-07: 1 mL

## 2019-02-08 MED ORDER — BUPIVACAINE HCL 0.25 % IJ SOLN
0.3300 mL | INTRAMUSCULAR | Status: AC | PRN
  Administered 2019-02-07: .33 mL

## 2019-02-09 DIAGNOSIS — G43109 Migraine with aura, not intractable, without status migrainosus: Secondary | ICD-10-CM | POA: Diagnosis not present

## 2019-02-10 DIAGNOSIS — G43109 Migraine with aura, not intractable, without status migrainosus: Secondary | ICD-10-CM | POA: Diagnosis not present

## 2019-02-11 DIAGNOSIS — G43109 Migraine with aura, not intractable, without status migrainosus: Secondary | ICD-10-CM | POA: Diagnosis not present

## 2019-02-12 DIAGNOSIS — G43109 Migraine with aura, not intractable, without status migrainosus: Secondary | ICD-10-CM | POA: Diagnosis not present

## 2019-02-13 ENCOUNTER — Encounter: Payer: Medicaid Other | Admitting: Physical Therapy

## 2019-02-13 ENCOUNTER — Ambulatory Visit: Payer: Medicaid Other | Admitting: Physical Therapy

## 2019-02-13 DIAGNOSIS — G43109 Migraine with aura, not intractable, without status migrainosus: Secondary | ICD-10-CM | POA: Diagnosis not present

## 2019-02-14 DIAGNOSIS — G43109 Migraine with aura, not intractable, without status migrainosus: Secondary | ICD-10-CM | POA: Diagnosis not present

## 2019-02-15 DIAGNOSIS — G43109 Migraine with aura, not intractable, without status migrainosus: Secondary | ICD-10-CM | POA: Diagnosis not present

## 2019-02-16 DIAGNOSIS — G43109 Migraine with aura, not intractable, without status migrainosus: Secondary | ICD-10-CM | POA: Diagnosis not present

## 2019-02-17 DIAGNOSIS — G43109 Migraine with aura, not intractable, without status migrainosus: Secondary | ICD-10-CM | POA: Diagnosis not present

## 2019-02-18 DIAGNOSIS — G43109 Migraine with aura, not intractable, without status migrainosus: Secondary | ICD-10-CM | POA: Diagnosis not present

## 2019-02-19 DIAGNOSIS — G43109 Migraine with aura, not intractable, without status migrainosus: Secondary | ICD-10-CM | POA: Diagnosis not present

## 2019-02-20 ENCOUNTER — Encounter: Payer: Self-pay | Admitting: Physical Therapy

## 2019-02-20 ENCOUNTER — Other Ambulatory Visit: Payer: Self-pay

## 2019-02-20 ENCOUNTER — Ambulatory Visit: Payer: Medicaid Other | Admitting: Internal Medicine

## 2019-02-20 ENCOUNTER — Ambulatory Visit: Payer: Medicaid Other | Admitting: Physical Therapy

## 2019-02-20 DIAGNOSIS — G43109 Migraine with aura, not intractable, without status migrainosus: Secondary | ICD-10-CM | POA: Diagnosis not present

## 2019-02-20 DIAGNOSIS — M25512 Pain in left shoulder: Secondary | ICD-10-CM

## 2019-02-20 DIAGNOSIS — M542 Cervicalgia: Secondary | ICD-10-CM

## 2019-02-20 DIAGNOSIS — M62838 Other muscle spasm: Secondary | ICD-10-CM

## 2019-02-20 DIAGNOSIS — G8929 Other chronic pain: Secondary | ICD-10-CM | POA: Diagnosis not present

## 2019-02-21 ENCOUNTER — Encounter: Payer: Self-pay | Admitting: Physical Therapy

## 2019-02-21 DIAGNOSIS — G43109 Migraine with aura, not intractable, without status migrainosus: Secondary | ICD-10-CM | POA: Diagnosis not present

## 2019-02-21 NOTE — Therapy (Signed)
[place], [place], [address] Phone: [phone]   Fax:  [phone]  Physical Therapy Treatment  Patient Details  Name: April Mcmahon MRN: [id number] Date of Birth: 13-Feb-1960 Referring Provider (PT): Dr Frankey Shown    Encounter Date: 02/20/2019  PT End of Session - 02/21/19 1105    Visit Number  3    Number of Visits  6    Date for PT Re-Evaluation  03/21/19    Authorization Time Period  02/05/19-02/25/19    Authorization - Visit Number  1    Authorization - Number of Visits  3    PT Start Time  0375    PT Stop Time  1226    PT Time Calculation (min)  41 min    Activity Tolerance  Patient tolerated treatment well    Behavior During Therapy  [facility] for tasks assessed/performed       Past Medical History:  Diagnosis Date  . Bilateral chronic knee pain 06/2014  . Chronic low back pain 06/2014  . Chronic migraine 02/1985  . Depression    hx of  . Diabetes mellitus without complication ([place]) 43/6067  . Fall   . Hyperlipidemia   . Hypertension 09/2013  . Shoulder pain     Past Surgical History:  Procedure Laterality Date  . no previous surgeries      There were no vitals filed for this visit.  Subjective Assessment - 02/20/19 1226    Subjective  Patient reports a lot f her pain has cleared up. She is just having significant pain in her upper trap. Her shoulder is feeling better.    Patient is accompained by:  Interpreter   Elyn Aquas [phone]   Currently in Pain?  Yes    Pain Score  7     Pain Location  Neck    Pain Orientation  Left    Pain Descriptors / Indicators  Aching    Pain Type  Chronic pain    Pain Radiating Towards  into the upper trap    Pain Onset  More than a month ago    Pain Frequency  Constant    Aggravating Factors   turning her head, looking up    Pain Relieving Factors  meds    Multiple Pain Sites  No         [facility] PT Assessment - 02/21/19 0001      AROM   Left  Shoulder Flexion  120 Degrees    Left Shoulder Internal Rotation  --   mild pain but able to reach to L-3   Left Shoulder External Rotation  --   able to reach to the top of her head    Cervical Flexion  13    Cervical Extension  15    Cervical - Right Rotation  45    Cervical - Left Rotation  50      Strength   Right Shoulder Flexion  3+/5      Palpation   Palpation comment  continues to have significant tenderness to palpation in her upper trap and cervical spine but improved tenderness to palpation in her shoulder                    [facility] Adult PT Treatment/Exercise - 02/21/19 0001      Neck Exercises: Standing   Other Standing Exercises  scap retraction yellow; showed patient how to put band in the door;  Neck Exercises: Supine   Neck Retraction  10 reps      Manual Therapy   Manual Therapy  Soft tissue mobilization;Manual Traction    Soft tissue mobilization  trigger point reelase to upper trap and cervical paraspinals    Manual Traction  gentle cervical traction and sub-occipital release       Neck Exercises: Stretches   Other Neck Stretches  cervical rotation x5; cervical flexion and extneison in pain free range x5              PT Education - 02/21/19 1059    Education Details  impritance of continuing HEP    Person(s) Educated  Patient    Methods  Explanation;Demonstration;Tactile cues;Verbal cues    Comprehension  Verbalized understanding;Returned demonstration;Verbal cues required;Tactile cues required       PT Short Term Goals - 02/21/19 1116      PT SHORT TERM GOAL #1   Title  Patient will increase bilateral cervical rotation by 20 degrees    Baseline  improved right 45 left 50 but goal not met    Time  3    Period  Weeks    Status  On-going    Target Date  03/14/19      PT SHORT TERM GOAL #2   Title  Patient will increase left shoulder active flexion by 20 degrees    Baseline  125 baseline 85    Time  3    Period  Weeks     Status  On-going    Target Date  03/14/19      PT SHORT TERM GOAL #3   Title  Patient will increase rleft shoulder flexion strength to 4/5    Baseline  3+/5 Baseline 1/5    Time  3    Period  Weeks    Status  Achieved        PT Long Term Goals - 02/21/19 1118      PT LONG TERM GOAL #1   Title  Patient will reach overhead to a shelf without pain in order to perfrom ADL's    Baseline  improved reaching but still painful    Time  3    Period  Weeks    Status  New    Target Date  03/14/19      PT LONG TERM GOAL #2   Title  Patient will deomonstrate 60 degrees of cervical rotation without pain in order to increase community safety    Baseline  45 right 50 left    Time  6    Period  Weeks    Status  New    Target Date  03/14/19            Plan - 02/21/19 1106    Clinical Impression Statement  Despite high pain levels in her upper trap she has shown some improvmenet. Her cervical range is still limited but has improved slightly.Her cervical rotation has improved to 45 left and 50 right. Her shoulder motion has improved significantly. her left shoulder flexion improved to 125 degrees from 85 at eval. Her strength is 3+/5 with flexion ascompared to 1/5 at eval. She has been doing her stretches and exercises at home. She has only come to 1 visit since her inital eval. She was advised that the pain moving out of her shoulder was a good thing and to continue her stretching and exercises. She may benefit from more consistent therapy. She reports she will try to come  more consitently. Therapy will re-cert her for her intial 3 visit period with [facility]. Therapy focused on manual therapy today but did add in scpaular retraction. She required min cuing for tehcnique.    Personal Factors and Comorbidities  Comorbidity 1;Comorbidity 2;Comorbidity 3+;Fitness    Comorbidities  chronic migrane, Low back pain, bilateral knee pain, falls    Examination-Activity Limitations  Locomotion  Level;Squat;Lift;Dressing;Carry;Sleep    Examination-Participation Restrictions  Cleaning;Community Activity;Other    Stability/Clinical Decision Making  Evolving/Moderate complexity    Clinical Decision Making  Moderate    Rehab Potential  Fair    PT Frequency  1x / week    PT Duration  3 weeks    PT Treatment/Interventions  ADLs/Self Care Home Management;Cryotherapy;Electrical Stimulation;Iontophoresis 31m/ml Dexamethasone;Ultrasound;DME Instruction;Therapeutic activities;Functional mobility training;Therapeutic exercise;Patient/family education;Manual techniques;Neuromuscular re-education;Passive range of motion;Taping;Dry needling    PT Next Visit Plan  continue with manual therapy; consider taping of the upper trap. continue with stretching and postural exercises. continue clvical mobilization    PT Home Exercise Plan  upper trap stretch; levaotr stretch; scpa retraction max cuing for tehcnique required for all    Consulted and Agree with Plan of Care  Patient       Patient will benefit from skilled therapeutic intervention in order to improve the following deficits and impairments:  Decreased activity tolerance, Decreased range of motion, Decreased safety awareness, Decreased strength, Increased fascial restricitons, Increased muscle spasms, Impaired UE functional use, Impaired sensation, Improper body mechanics, Postural dysfunction, Obesity, Pain  Visit Diagnosis: Cervicalgia  Chronic left shoulder pain  Other muscle spasm     Problem List Patient Active Problem List   Diagnosis Date Noted  . Microalbuminuria 11/15/2018  . Muscle cramps 02/21/2018  . Hepatic steatosis 12/23/2017  . MCI (mild cognitive impairment) 02/14/2017  . Diabetic peripheral neuropathy ([place] 02/14/2017  . Memory loss 12/27/2016  . Dysfunctional uterine bleeding 08/30/2016  . Analgesic rebound headache 06/24/2016  . Esophageal dysphagia 06/21/2016  . Tobacco dependence 01/20/2016  . Plantar  fasciitis, bilateral 12/24/2015  . 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 Diabetes type 2, controlled ([place] 06/19/2015  . Essential hypertension 06/19/2015  . Vitamin D deficiency 09/24/2013  . Hyperlipidemia 09/24/2013  . Migraine 05/15/2013  . Low back pain 08/18/2012    DCarney LivingPT DPT  02/21/2019, 12:55 PM  [facility] [place] [address]Phone: [phone]  Fax:  [phone] Name: TYajahira TisonMRN: [id number] of Birth: [birth year]

## 2019-02-21 NOTE — Therapy (Deleted)
[place], [place], [address] Phone: [phone]   Fax:  [phone]  Physical Therapy Treatment/Re-cert   Patient Details  Name: April Mcmahon MRN: [id number] Date of Birth: 09-17-1960 Referring Provider (PT): Dr Frankey Shown    Encounter Date: 02/20/2019  PT End of Session - 02/21/19 1105    Visit Number  3    Number of Visits  4    Date for PT Re-Evaluation  02/14/19    Authorization Time Period  02/05/19-02/25/19    Authorization - Visit Number  1    Authorization - Number of Visits  3    PT Start Time  9373    PT Stop Time  1226    PT Time Calculation (min)  41 min    Activity Tolerance  Patient tolerated treatment well    Behavior During Therapy  [facility] for tasks assessed/performed       Past Medical History:  Diagnosis Date  . Bilateral chronic knee pain 06/2014  . Chronic low back pain 06/2014  . Chronic migraine 02/1985  . Depression    hx of  . Diabetes mellitus without complication ([place]) 42/8768  . Fall   . Hyperlipidemia   . Hypertension 09/2013  . Shoulder pain     Past Surgical History:  Procedure Laterality Date  . no previous surgeries      There were no vitals filed for this visit.  Subjective Assessment - 02/20/19 1226    Subjective  Patient reports a lot f her pain has cleared up. She is just having significant pain in her upper trap. Her shoulder is feeling better.    Patient is accompained by:  Interpreter   Elyn Aquas [phone]   Currently in Pain?  Yes    Pain Score  7     Pain Location  Neck    Pain Orientation  Left    Pain Descriptors / Indicators  Aching    Pain Type  Chronic pain    Pain Radiating Towards  into the upper trap    Pain Onset  More than a month ago    Pain Frequency  Constant    Aggravating Factors   turning her head, looking up    Pain Relieving Factors  meds    Multiple Pain Sites  No         [facility] PT Assessment - 02/21/19 0001      AROM   Left Shoulder Flexion  120 Degrees    Left Shoulder Internal Rotation  --   mild pain but able to reach to L-3   Left Shoulder External Rotation  --   able to reach to the top of her head    Cervical Flexion  13    Cervical Extension  15    Cervical - Right Rotation  45    Cervical - Left Rotation  50      Strength   Right Shoulder Flexion  3+/5      Palpation   Palpation comment  continues to have significant tenderness to palpation in her upper trap and cervical spine but improved tenderness to palpation in her shoulder                    [facility] Adult PT Treatment/Exercise - 02/21/19 0001      Neck Exercises: Standing   Other Standing Exercises  scap retraction yellow; showed patient how to put band in the door;  Neck Exercises: Supine   Neck Retraction  10 reps      Manual Therapy   Manual Therapy  Soft tissue mobilization;Manual Traction    Soft tissue mobilization  trigger point reelase to upper trap and cervical paraspinals    Manual Traction  gentle cervical traction and sub-occipital release       Neck Exercises: Stretches   Other Neck Stretches  cervical rotation x5; cervical flexion and extneison in pain free range x5              PT Education - 02/21/19 1059    Education Details  impritance of continuing HEP    Person(s) Educated  Patient    Methods  Explanation;Demonstration;Tactile cues;Verbal cues    Comprehension  Verbalized understanding;Returned demonstration;Verbal cues required;Tactile cues required       PT Short Term Goals - 02/21/19 1116      PT SHORT TERM GOAL #1   Title  Patient will increase bilateral cervical rotation by 20 degrees    Baseline  improved right 45 left 50 but goal not met    Time  3    Period  Weeks    Status  On-going    Target Date  03/14/19      PT SHORT TERM GOAL #2   Title  Patient will increase left shoulder active flexion by 20 degrees    Baseline  125 baseline 85    Time  3    Period  Weeks     Status  On-going    Target Date  03/14/19      PT SHORT TERM GOAL #3   Title  Patient will increase rleft shoulder flexion strength to 4/5    Baseline  3+/5 Baseline 1/5    Time  3    Period  Weeks    Status  Achieved        PT Long Term Goals - 02/21/19 1118      PT LONG TERM GOAL #1   Title  Patient will reach overhead to a shelf without pain in order to perfrom ADL's    Baseline  improved reaching but still painful    Time  3    Period  Weeks    Status  New    Target Date  03/14/19      PT LONG TERM GOAL #2   Title  Patient will deomonstrate 60 degrees of cervical rotation without pain in order to increase community safety    Baseline  45 right 50 left    Time  6    Period  Weeks    Status  New    Target Date  03/14/19            Plan - 02/21/19 1106    Clinical Impression Statement  Despite high pain levels in her upper trap she has shown some improvmenet. Her cervical range is still limited but has improved slightly.Her cervical rotation has improved to 45 left and 50 right. Her shoulder motion has improved significantly. her left shoulder flexion improved to 125 degrees from 85 at eval. Her strength is 3+/5 with flexion ascompared to 1/5 at eval. She has been doing her stretches and exercises at home. She has only come to 1 visit since her inital eval. She was advised that the pain moving out of her shoulder was a good thing and to continue her stretching and exercises. She may benefit from more consistent therapy. She reports she will try to come  more consitently. Therapy will re-cert her for her intial 3 visit period with [facility]. Therapy focused on manual therapy today but did add in scpaular retraction. She required min cuing for tehcnique.    Personal Factors and Comorbidities  Comorbidity 1;Comorbidity 2;Comorbidity 3+;Fitness    Comorbidities  chronic migrane, Low back pain, bilateral knee pain, falls    Examination-Activity Limitations  Locomotion  Level;Squat;Lift;Dressing;Carry;Sleep    Examination-Participation Restrictions  Cleaning;Community Activity;Other    Stability/Clinical Decision Making  Evolving/Moderate complexity    Clinical Decision Making  Moderate    Rehab Potential  Fair    PT Frequency  1x / week    PT Duration  3 weeks    PT Treatment/Interventions  ADLs/Self Care Home Management;Cryotherapy;Electrical Stimulation;Iontophoresis 60m/ml Dexamethasone;Ultrasound;DME Instruction;Therapeutic activities;Functional mobility training;Therapeutic exercise;Patient/family education;Manual techniques;Neuromuscular re-education;Passive range of motion;Taping;Dry needling    PT Next Visit Plan  continue with manual therapy; consider taping of the upper trap. continue with stretching and postural exercises. continue clvical mobilization    PT Home Exercise Plan  upper trap stretch; levaotr stretch; scpa retraction max cuing for tehcnique required for all    Consulted and Agree with Plan of Care  Patient       Patient will benefit from skilled therapeutic intervention in order to improve the following deficits and impairments:  Decreased activity tolerance, Decreased range of motion, Decreased safety awareness, Decreased strength, Increased fascial restricitons, Increased muscle spasms, Impaired UE functional use, Impaired sensation, Improper body mechanics, Postural dysfunction, Obesity, Pain  Visit Diagnosis: Cervicalgia  Chronic left shoulder pain  Other muscle spasm     Problem List Patient Active Problem List   Diagnosis Date Noted  . Microalbuminuria 11/15/2018  . Muscle cramps 02/21/2018  . Hepatic steatosis 12/23/2017  . MCI (mild cognitive impairment) 02/14/2017  . Diabetic peripheral neuropathy ([place] 02/14/2017  . Memory loss 12/27/2016  . Dysfunctional uterine bleeding 08/30/2016  . Analgesic rebound headache 06/24/2016  . Esophageal dysphagia 06/21/2016  . Tobacco dependence 01/20/2016  . Plantar  fasciitis, bilateral 12/24/2015  . 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 Diabetes type 2, controlled ([place] 06/19/2015  . Essential hypertension 06/19/2015  . Vitamin D deficiency 09/24/2013  . Hyperlipidemia 09/24/2013  . Migraine 05/15/2013  . Low back pain 08/18/2012    DCarney Living1/20/2021, 12:54 PM  [facility] [address]Phone: [phone]  Fax:  [phone] Name: April TowersMRN: [id number] of Birth: 110/22/1962

## 2019-02-22 DIAGNOSIS — G43109 Migraine with aura, not intractable, without status migrainosus: Secondary | ICD-10-CM | POA: Diagnosis not present

## 2019-02-23 DIAGNOSIS — G43109 Migraine with aura, not intractable, without status migrainosus: Secondary | ICD-10-CM | POA: Diagnosis not present

## 2019-02-24 DIAGNOSIS — G43109 Migraine with aura, not intractable, without status migrainosus: Secondary | ICD-10-CM | POA: Diagnosis not present

## 2019-02-25 DIAGNOSIS — G43109 Migraine with aura, not intractable, without status migrainosus: Secondary | ICD-10-CM | POA: Diagnosis not present

## 2019-02-26 DIAGNOSIS — G43109 Migraine with aura, not intractable, without status migrainosus: Secondary | ICD-10-CM | POA: Diagnosis not present

## 2019-02-27 DIAGNOSIS — G43109 Migraine with aura, not intractable, without status migrainosus: Secondary | ICD-10-CM | POA: Diagnosis not present

## 2019-02-28 DIAGNOSIS — G43109 Migraine with aura, not intractable, without status migrainosus: Secondary | ICD-10-CM | POA: Diagnosis not present

## 2019-03-01 DIAGNOSIS — G43109 Migraine with aura, not intractable, without status migrainosus: Secondary | ICD-10-CM | POA: Diagnosis not present

## 2019-03-02 DIAGNOSIS — G43109 Migraine with aura, not intractable, without status migrainosus: Secondary | ICD-10-CM | POA: Diagnosis not present

## 2019-03-03 DIAGNOSIS — G43109 Migraine with aura, not intractable, without status migrainosus: Secondary | ICD-10-CM | POA: Diagnosis not present

## 2019-03-04 DIAGNOSIS — G43109 Migraine with aura, not intractable, without status migrainosus: Secondary | ICD-10-CM | POA: Diagnosis not present

## 2019-03-05 ENCOUNTER — Ambulatory Visit: Payer: Medicaid Other | Attending: Orthopaedic Surgery | Admitting: Physical Therapy

## 2019-03-05 ENCOUNTER — Other Ambulatory Visit: Payer: Self-pay

## 2019-03-05 DIAGNOSIS — M542 Cervicalgia: Secondary | ICD-10-CM | POA: Diagnosis present

## 2019-03-05 DIAGNOSIS — M62838 Other muscle spasm: Secondary | ICD-10-CM | POA: Insufficient documentation

## 2019-03-05 DIAGNOSIS — G8929 Other chronic pain: Secondary | ICD-10-CM | POA: Insufficient documentation

## 2019-03-05 DIAGNOSIS — M25512 Pain in left shoulder: Secondary | ICD-10-CM | POA: Insufficient documentation

## 2019-03-05 DIAGNOSIS — G43109 Migraine with aura, not intractable, without status migrainosus: Secondary | ICD-10-CM | POA: Diagnosis not present

## 2019-03-06 ENCOUNTER — Encounter: Payer: Self-pay | Admitting: Physical Therapy

## 2019-03-06 DIAGNOSIS — G43109 Migraine with aura, not intractable, without status migrainosus: Secondary | ICD-10-CM | POA: Diagnosis not present

## 2019-03-06 NOTE — Therapy (Signed)
[place], [place], [address] Phone: [phone]   Fax:  [phone]  Physical Therapy Treatment  Patient Details  Name: April Mcmahon MRN: [id number] Date of Birth: May 15, 1960 Referring Provider (PT): Dr Frankey Shown    Encounter Date: 03/05/2019  PT End of Session - 03/06/19 1037    Visit Number  4    Number of Visits  6    Date for PT Re-Evaluation  03/21/19    Authorization Type  MCD    Authorization Time Period  02/05/19-02/25/19    PT Start Time  1415    PT Stop Time  1513    PT Time Calculation (min)  58 min    Activity Tolerance  Patient tolerated treatment well    Behavior During Therapy  [facility] for tasks assessed/performed       Past Medical History:  Diagnosis Date  . Bilateral chronic knee pain 06/2014  . Chronic low back pain 06/2014  . Chronic migraine 02/1985  . Depression    hx of  . Diabetes mellitus without complication ([place]) 69/4503  . Fall   . Hyperlipidemia   . Hypertension 09/2013  . Shoulder pain     Past Surgical History:  Procedure Laterality Date  . no previous surgeries      There were no vitals filed for this visit.  Subjective Assessment - 03/05/19 1418    Subjective  Patient feels like she is still having the pain but it is more in her upper traps. She continues to have pain at night.    Patient is accompained by:  Interpreter   Nilda Calamity [phone]   Currently in Pain?  Yes    Pain Score  --   unable to use pain scale   Pain Location  Neck    Pain Orientation  Left    Pain Descriptors / Indicators  Aching    Pain Type  Chronic pain    Pain Onset  More than a month ago    Pain Frequency  Constant    Aggravating Factors   sleeping    Pain Relieving Factors  rest    Effect of Pain on Daily Activities  pain sleeping at night                       [facility] Adult PT Treatment/Exercise - 03/06/19 0001      Self-Care   Self-Care  Other Self-Care Comments     Other Self-Care Comments   reviewed use of a pillow. She reports she is not using a pillow. She sometimes uses a rolled up towel. She was advised isf she wasn;t hurting all night that may be fine but if she is hurting all night this is probabaly adding       Neck Exercises: Standing   Other Standing Exercises  scap retraction yellow; showed patient how to put band in the door;       Neck Exercises: Seated   Other Seated Exercise  bilateral er and horizontal abduction max vcs for cuing. Given for HEP 2x10       Modalities   Modalities  Moist Heat      Moist Heat Therapy   Number Minutes Moist Heat  10 Minutes    Moist Heat Location  Cervical      Manual Therapy   Manual Therapy  Soft tissue mobilization;Manual Traction    Soft tissue mobilization  trigger point reelase to upper trap  and cervical paraspinals    Manual Traction  gentle cervical traction and sub-occipital release       Neck Exercises: Stretches   Upper Trapezius Stretch  3 reps;20 seconds    Levator Stretch  2 reps;20 seconds             PT Education - 03/06/19 1007    Education Details  HEP and symptom management    Person(s) Educated  Patient    Methods  Explanation;Demonstration;Tactile cues;Verbal cues    Comprehension  Verbalized understanding;Returned demonstration;Verbal cues required;Tactile cues required       PT Short Term Goals - 03/06/19 1057      PT SHORT TERM GOAL #1   Title  Patient will increase bilateral cervical rotation by 20 degrees    Baseline  improved right 45 left 50 but goal not met    Time  3    Period  Weeks    Status  On-going    Target Date  03/14/19      PT SHORT TERM GOAL #2   Title  Patient will increase left shoulder active flexion by 20 degrees    Baseline  125 baseline 85    Time  3    Period  Weeks    Status  On-going    Target Date  03/14/19      PT SHORT TERM GOAL #3   Title  Patient will increase rleft shoulder flexion strength to 4/5    Baseline  3+/5  Baseline 1/5    Time  3    Period  Weeks    Status  On-going    Target Date  02/07/19        PT Long Term Goals - 02/21/19 1118      PT LONG TERM GOAL #1   Title  Patient will reach overhead to a shelf without pain in order to perfrom ADL's    Baseline  improved reaching but still painful    Time  3    Period  Weeks    Status  New    Target Date  03/14/19      PT LONG TERM GOAL #2   Title  Patient will deomonstrate 60 degrees of cervical rotation without pain in order to increase community safety    Baseline  45 right 50 left    Time  6    Period  Weeks    Status  New    Target Date  03/14/19            Plan - 03/06/19 1038    Clinical Impression Statement  Patient's pain has centrlized to her lower cervical spine. She has tightness on both side but > on the right. She tolerated soft tissue mobilization well. She was advised to try something different with her pillows. She reports she has been using no pillow or a roled up towel but has had pain so bad she can not sleep at night. she was adviesed to try something with more support and that keeps her head in neutral. Therapy reviewed stretches. She continues to push into painful ranges. Depsite max vc's through she will lekley still require education for techique.    Personal Factors and Comorbidities  Comorbidity 1;Comorbidity 2;Comorbidity 3+;Fitness    Comorbidities  chronic migrane, Low back pain, bilateral knee pain, falls    Examination-Activity Limitations  Locomotion Level;Squat;Lift;Dressing;Carry;Sleep    Examination-Participation Restrictions  Cleaning;Community Activity;Other    Stability/Clinical Decision Making  Evolving/Moderate complexity  Clinical Decision Making  Moderate    Rehab Potential  Fair    PT Frequency  1x / week    PT Duration  3 weeks    PT Treatment/Interventions  ADLs/Self Care Home Management;Cryotherapy;Electrical Stimulation;Iontophoresis 40m/ml Dexamethasone;Ultrasound;DME  Instruction;Therapeutic activities;Functional mobility training;Therapeutic exercise;Patient/family education;Manual techniques;Neuromuscular re-education;Passive range of motion;Taping;Dry needling    PT Next Visit Plan  continue with manual therapy; consider taping of the upper trap. continue with stretching and postural exercises. continue clvical mobilization    PT Home Exercise Plan  upper trap stretch; levaotr stretch; scpa retraction max cuing for tehcnique required for all    Consulted and Agree with Plan of Care  Patient       Patient will benefit from skilled therapeutic intervention in order to improve the following deficits and impairments:  Decreased activity tolerance, Decreased range of motion, Decreased safety awareness, Decreased strength, Increased fascial restricitons, Increased muscle spasms, Impaired UE functional use, Impaired sensation, Improper body mechanics, Postural dysfunction, Obesity, Pain  Visit Diagnosis: Cervicalgia  Chronic left shoulder pain  Other muscle spasm     Problem List Patient Active Problem List   Diagnosis Date Noted  . Microalbuminuria 11/15/2018  . Muscle cramps 02/21/2018  . Hepatic steatosis 12/23/2017  . MCI (mild cognitive impairment) 02/14/2017  . Diabetic peripheral neuropathy ([place] 02/14/2017  . Memory loss 12/27/2016  . Dysfunctional uterine bleeding 08/30/2016  . Analgesic rebound headache 06/24/2016  . Esophageal dysphagia 06/21/2016  . Tobacco dependence 01/20/2016  . Plantar fasciitis, bilateral 12/24/2015  . 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 Diabetes type 2, controlled ([place] 06/19/2015  . Essential hypertension 06/19/2015  . Vitamin D deficiency 09/24/2013  . Hyperlipidemia 09/24/2013  . Migraine 05/15/2013  . Low back pain 08/18/2012    DCarney LivingPT DPT  03/06/2019, 11:00 AM  [facility] [address]Phone:  [phone]  Fax:  [phone] Name: April NassMRN: [id number] of Birth: 110-09-1960

## 2019-03-07 DIAGNOSIS — G43109 Migraine with aura, not intractable, without status migrainosus: Secondary | ICD-10-CM | POA: Diagnosis not present

## 2019-03-08 DIAGNOSIS — G43109 Migraine with aura, not intractable, without status migrainosus: Secondary | ICD-10-CM | POA: Diagnosis not present

## 2019-03-09 ENCOUNTER — Other Ambulatory Visit: Payer: Self-pay

## 2019-03-09 ENCOUNTER — Ambulatory Visit: Payer: Medicaid Other | Admitting: Internal Medicine

## 2019-03-09 DIAGNOSIS — G43109 Migraine with aura, not intractable, without status migrainosus: Secondary | ICD-10-CM | POA: Diagnosis not present

## 2019-03-10 DIAGNOSIS — G43109 Migraine with aura, not intractable, without status migrainosus: Secondary | ICD-10-CM | POA: Diagnosis not present

## 2019-03-11 DIAGNOSIS — G43109 Migraine with aura, not intractable, without status migrainosus: Secondary | ICD-10-CM | POA: Diagnosis not present

## 2019-03-12 ENCOUNTER — Ambulatory Visit: Payer: Medicaid Other | Admitting: Physical Therapy

## 2019-03-12 ENCOUNTER — Other Ambulatory Visit: Payer: Self-pay

## 2019-03-12 ENCOUNTER — Encounter: Payer: Self-pay | Admitting: Physical Therapy

## 2019-03-12 DIAGNOSIS — G8929 Other chronic pain: Secondary | ICD-10-CM

## 2019-03-12 DIAGNOSIS — M542 Cervicalgia: Secondary | ICD-10-CM

## 2019-03-12 DIAGNOSIS — M62838 Other muscle spasm: Secondary | ICD-10-CM

## 2019-03-12 DIAGNOSIS — G43109 Migraine with aura, not intractable, without status migrainosus: Secondary | ICD-10-CM | POA: Diagnosis not present

## 2019-03-13 ENCOUNTER — Encounter: Payer: Self-pay | Admitting: Physical Therapy

## 2019-03-13 DIAGNOSIS — G43109 Migraine with aura, not intractable, without status migrainosus: Secondary | ICD-10-CM | POA: Diagnosis not present

## 2019-03-13 NOTE — Therapy (Signed)
[place], [place], [address] Phone: [phone]   Fax:  [phone]  Physical Therapy Treatment  Patient Details  Name: April Mcmahon MRN: [id number] Date of Birth: 1961-01-10 Referring Provider (PT): Dr Frankey Shown    Encounter Date: 03/12/2019  PT End of Session - 03/13/19 0800    Visit Number  5    Number of Visits  6    Date for PT Re-Evaluation  03/21/19    Authorization Type  MCD    Authorization Time Period  02/05/19-02/25/19    PT Start Time  1145    PT Stop Time  1236    PT Time Calculation (min)  51 min    Activity Tolerance  Patient tolerated treatment well    Behavior During Therapy  [facility] for tasks assessed/performed       Past Medical History:  Diagnosis Date  . Bilateral chronic knee pain 06/2014  . Chronic low back pain 06/2014  . Chronic migraine 02/1985  . Depression    hx of  . Diabetes mellitus without complication ([place]) 38/8828  . Fall   . Hyperlipidemia   . Hypertension 09/2013  . Shoulder pain     Past Surgical History:  Procedure Laterality Date  . no previous surgeries      There were no vitals filed for this visit.  Subjective Assessment - 03/12/19 1620    Subjective  Patient continues to rpeort improvement. She has tried sleeping on two twoels which has helped some. She feels most of the pain and tightness is in her lower cervical area.    Currently in Pain?  Yes    Pain Score  4     Pain Location  Neck    Pain Orientation  Left    Pain Descriptors / Indicators  Aching    Pain Type  Chronic pain    Pain Onset  More than a month ago    Pain Frequency  Constant    Aggravating Factors   sleeping    Pain Relieving Factors  rest                       [facility] Adult PT Treatment/Exercise - 03/13/19 0001      Self-Care   Self-Care  Other Self-Care Comments    Other Self-Care Comments   reviewed use of a pillow. She reports she is not using a pillow.  She sometimes uses a rolled up towel. She was advised isf she wasn;t hurting all night that may be fine but if she is hurting all night this is probabaly adding       Neck Exercises: Standing   Other Standing Exercises  scap retraction yellow; showed patient how to put band in the door;       Neck Exercises: Seated   Other Seated Exercise  bilateral er and horizontal abduction max vcs for cuing. Given for HEP 2x10       Modalities   Modalities  Moist Heat      Moist Heat Therapy   Moist Heat Location  Cervical      Manual Therapy   Manual Therapy  Soft tissue mobilization;Manual Traction    Soft tissue mobilization  trigger point reelase to upper trap and cervical paraspinals    Manual Traction  gentle cervical traction and sub-occipital release       Neck Exercises: Stretches   Upper Trapezius Stretch  3 reps;20 seconds  Levator Stretch  2 reps;20 seconds             PT Education - 03/13/19 0759    Education Details  HEP and symptom mangement    Person(s) Educated  Patient    Methods  Explanation;Demonstration;Tactile cues;Verbal cues    Comprehension  Verbalized understanding;Returned demonstration;Verbal cues required;Tactile cues required       PT Short Term Goals - 03/06/19 1057      PT SHORT TERM GOAL #1   Title  Patient will increase bilateral cervical rotation by 20 degrees    Baseline  improved right 45 left 50 but goal not met    Time  3    Period  Weeks    Status  On-going    Target Date  03/14/19      PT SHORT TERM GOAL #2   Title  Patient will increase left shoulder active flexion by 20 degrees    Baseline  125 baseline 85    Time  3    Period  Weeks    Status  On-going    Target Date  03/14/19      PT SHORT TERM GOAL #3   Title  Patient will increase rleft shoulder flexion strength to 4/5    Baseline  3+/5 Baseline 1/5    Time  3    Period  Weeks    Status  On-going    Target Date  02/07/19        PT Long Term Goals - 03/13/19 0803       PT LONG TERM GOAL #1   Title  Patient will reach overhead to a shelf without pain in order to perfrom ADL's    Baseline  improved reaching but still painful    Time  3    Period  Weeks    Status  On-going      PT LONG TERM GOAL #2   Title  Patient will deomonstrate 60 degrees of cervical rotation without pain in order to increase community safety    Time  6    Period  Weeks    Status  On-going            Plan - 03/12/19 1206    Clinical Impression Statement  Patient is making progress. Her pain and stiffness are centralizing. shehad less tenderness to light touch today. She was advised to continue her stretching and exercises at home. She had spasming in her paraspinals near her lower cervical spine. Her motion has improved per visual inspection.    Comorbidities  chronic migrane, Low back pain, bilateral knee pain, falls    Examination-Activity Limitations  Locomotion Level;Squat;Lift;Dressing;Carry;Sleep    Examination-Participation Restrictions  Cleaning;Community Activity;Other    Stability/Clinical Decision Making  Evolving/Moderate complexity    Clinical Decision Making  Moderate    Rehab Potential  Fair    PT Frequency  1x / week    PT Duration  3 weeks    PT Treatment/Interventions  ADLs/Self Care Home Management;Cryotherapy;Electrical Stimulation;Iontophoresis 62m/ml Dexamethasone;Ultrasound;DME Instruction;Therapeutic activities;Functional mobility training;Therapeutic exercise;Patient/family education;Manual techniques;Neuromuscular re-education;Passive range of motion;Taping;Dry needling    PT Next Visit Plan  continue with manual therapy; consider taping of the upper trap. continue with stretching and postural exercises. continue clvical mobilization    PT Home Exercise Plan  upper trap stretch; levaotr stretch; scpa retraction max cuing for tehcnique required for all    Consulted and Agree with Plan of Care  Patient       Patient will  benefit from skilled  therapeutic intervention in order to improve the following deficits and impairments:  Decreased activity tolerance, Decreased range of motion, Decreased safety awareness, Decreased strength, Increased fascial restricitons, Increased muscle spasms, Impaired UE functional use, Impaired sensation, Improper body mechanics, Postural dysfunction, Obesity, Pain  Visit Diagnosis: Cervicalgia  Chronic left shoulder pain  Other muscle spasm     Problem List Patient Active Problem List   Diagnosis Date Noted  . Microalbuminuria 11/15/2018  . Muscle cramps 02/21/2018  . Hepatic steatosis 12/23/2017  . MCI (mild cognitive impairment) 02/14/2017  . Diabetic peripheral neuropathy ([place]) 02/14/2017  . Memory loss 12/27/2016  . Dysfunctional uterine bleeding 08/30/2016  . Analgesic rebound headache 06/24/2016  . Esophageal dysphagia 06/21/2016  . Tobacco dependence 01/20/2016  . Plantar fasciitis, bilateral 12/24/2015  . 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 Diabetes type 2, controlled ([place]) 06/19/2015  . Essential hypertension 06/19/2015  . Vitamin D deficiency 09/24/2013  . Hyperlipidemia 09/24/2013  . Migraine 05/15/2013  . Low back pain 08/18/2012    Carney Living 03/13/2019, 8:07 AM  [facility] [address] [place], [place], [address] Phone: [phone]   Fax:  [phone]  Name: April Mcmahon MRN: [id number] Date of Birth: [birth year]

## 2019-03-14 DIAGNOSIS — G43109 Migraine with aura, not intractable, without status migrainosus: Secondary | ICD-10-CM | POA: Diagnosis not present

## 2019-03-15 ENCOUNTER — Encounter: Payer: Self-pay | Admitting: Family

## 2019-03-15 ENCOUNTER — Ambulatory Visit: Payer: Medicaid Other | Attending: Internal Medicine | Admitting: Family

## 2019-03-15 ENCOUNTER — Other Ambulatory Visit: Payer: Self-pay

## 2019-03-15 VITALS — BP 136/75 | HR 75 | Temp 97.8°F | Resp 16 | Wt 157.0 lb

## 2019-03-15 DIAGNOSIS — M25511 Pain in right shoulder: Secondary | ICD-10-CM | POA: Insufficient documentation

## 2019-03-15 DIAGNOSIS — Z87891 Personal history of nicotine dependence: Secondary | ICD-10-CM | POA: Diagnosis not present

## 2019-03-15 DIAGNOSIS — Z79899 Other long term (current) drug therapy: Secondary | ICD-10-CM | POA: Insufficient documentation

## 2019-03-15 DIAGNOSIS — Z7984 Long term (current) use of oral hypoglycemic drugs: Secondary | ICD-10-CM | POA: Insufficient documentation

## 2019-03-15 DIAGNOSIS — K760 Fatty (change of) liver, not elsewhere classified: Secondary | ICD-10-CM | POA: Diagnosis not present

## 2019-03-15 DIAGNOSIS — Z888 Allergy status to other drugs, medicaments and biological substances status: Secondary | ICD-10-CM | POA: Insufficient documentation

## 2019-03-15 DIAGNOSIS — E1142 Type 2 diabetes mellitus with diabetic polyneuropathy: Secondary | ICD-10-CM | POA: Diagnosis not present

## 2019-03-15 DIAGNOSIS — M25512 Pain in left shoulder: Secondary | ICD-10-CM | POA: Insufficient documentation

## 2019-03-15 DIAGNOSIS — Z820 Family history of epilepsy and other diseases of the nervous system: Secondary | ICD-10-CM | POA: Diagnosis not present

## 2019-03-15 DIAGNOSIS — G43109 Migraine with aura, not intractable, without status migrainosus: Secondary | ICD-10-CM | POA: Diagnosis not present

## 2019-03-15 DIAGNOSIS — M542 Cervicalgia: Secondary | ICD-10-CM | POA: Diagnosis not present

## 2019-03-15 DIAGNOSIS — F17200 Nicotine dependence, unspecified, uncomplicated: Secondary | ICD-10-CM

## 2019-03-15 DIAGNOSIS — I10 Essential (primary) hypertension: Secondary | ICD-10-CM | POA: Insufficient documentation

## 2019-03-15 DIAGNOSIS — E785 Hyperlipidemia, unspecified: Secondary | ICD-10-CM | POA: Diagnosis not present

## 2019-03-15 MED ORDER — NAPROXEN 500 MG PO TABS: 500.0000 mg | ORAL_TABLET | Freq: Two times a day (BID) | ORAL | 0 refills | Status: DC

## 2019-03-15 MED ORDER — NICOTINE 21 MG/24HR TD PT24: MEDICATED_PATCH | TRANSDERMAL | 1 refills | Status: DC

## 2019-03-15 NOTE — Patient Instructions (Addendum)
Naproxen for neck pain. Continue physical therapy. Follow-up with attending physician as needed. Cervical Radiculopathy  Cervical radiculopathy happens when a nerve in the neck (a cervical nerve) is pinched or bruised. This condition can happen because of an injury to the cervical spine (vertebrae) in the neck, or as part of the normal aging process. Pressure on the cervical nerves can cause pain or numbness that travels from the neck all the way down into the arm and fingers. Usually, this condition gets better with rest. Treatment may be needed if the condition does not improve. What are the causes? This condition may be caused by:  A neck injury.  A bulging (herniated) disk.  Muscle spasms.  Muscle tightness in the neck because of overuse.  Arthritis.  Breakdown or degeneration in the bones and joints of the spine (spondylosis) due to aging.  Bone spurs that may develop near the cervical nerves. What are the signs or symptoms? Symptoms of this condition include:  Pain. The pain may travel from the neck to the arm and hand. The pain can be severe or irritating. It may be worse when you move your neck.  Numbness or tingling in your arm or hand.  Weakness in the affected arm and hand, in severe cases. How is this diagnosed? This condition may be diagnosed based on your symptoms, your medical history, and a physical exam. You may also have tests, including:  X-rays.  A CT scan.  An MRI.  An electromyogram (EMG).  Nerve conduction tests. How is this treated? In many cases, treatment is not needed for this condition. With rest, the condition usually gets better over time. If treatment is needed, options may include:  Wearing a soft neck collar (cervical collar) for short periods of time, as told by your health care provider.  Doing physical therapy to strengthen your neck muscles.  Taking medicines, such as NSAIDs or oral corticosteroids.  Having spinal injections, in  severe cases.  Having surgery. This may be needed if other treatments do not help. Different types of surgery may be done depending on the cause of this condition. Follow these instructions at home: If you have a cervical collar:  Wear it as told by your health care provider. Remove it only as told by your health care provider.  Ask your health care provider if you can remove the collar for cleaning and bathing. If you are allowed to remove the collar for cleaning or bathing: ? Follow instructions from your health care provider about how to remove the collar safely. ? Clean the collar by wiping it with mild soap and water and drying it completely. ? Take out any removable pads in the collar every 1-2 days, and wash them by hand with soap and water. Let them air-dry completely before you put them back in the collar. ? Check your skin under the collar for irritation or sores. If you see any, tell your health care provider. Managing pain      Take over-the-counter and prescription medicines only as told by your health care provider.  If directed, put ice on the affected area. ? If you have a soft neck collar, remove it as told by your health care provider. ? Put ice in a plastic bag. ? Place a towel between your skin and the bag. ? Leave the ice on for 20 minutes, 2-3 times a day.  If applying ice does not help, you can try using heat. Use the heat source that your health  care provider recommends, such as a moist heat pack or a heating pad. ? Place a towel between your skin and the heat source. ? Leave the heat on for 20-30 minutes. ? Remove the heat if your skin turns bright red. This is especially important if you are unable to feel pain, heat, or cold. You may have a greater risk of getting burned.  Try a gentle neck and shoulder massage to help relieve symptoms. Activity  Rest as needed.  Return to your normal activities as told by your health care provider. Ask your health care  provider what activities are safe for you.  Do stretching and strengthening exercises as told by your health care provider or physical therapist.  Do not lift anything that is heavier than 10 lb (4.5 kg) until your health care provider tells you that it is safe. General instructions  Use a flat pillow when you sleep.  Do not drive while wearing a cervical collar. If you do not have a cervical collar, ask your health care provider if it is safe to drive while your neck heals.  Ask your health care provider if the medicine prescribed to you requires you to avoid driving or using heavy machinery.  Do not use any products that contain nicotine or tobacco, such as cigarettes, e-cigarettes, and chewing tobacco. These can delay healing. If you need help quitting, ask your health care provider.  Keep all follow-up visits as told by your health care provider. This is important. Contact a health care provider if:  Your condition does not improve with treatment. Get help right away if:  Your pain gets much worse and cannot be controlled with medicines.  You have weakness or numbness in your hand, arm, face, or leg.  You have a high fever.  You have a stiff, rigid neck.  You lose control of your bowels or your bladder (have incontinence).  You have trouble with walking, balance, or speaking. Summary  Cervical radiculopathy happens when a nerve in the neck is pinched or bruised.  A nerve can get pinched from a bulging disk, arthritis, muscle spasms, or an injury to the neck.  Symptoms include pain, tingling, or numbness radiating from the neck into the arm or hand. Weakness can also occur in severe cases.  Treatment may include rest, wearing a cervical collar, and physical therapy. Medicines may be prescribed to help with pain. In severe cases, injections or surgery may be needed. This information is not intended to replace advice given to you by your health care provider. Make sure you  discuss any questions you have with your health care provider. Document Revised: 12/09/2017 Document Reviewed: 12/09/2017 Elsevier Patient Education  2020 [employer].

## 2019-03-15 NOTE — Progress Notes (Signed)
Patient ID: April Mcmahon, female    DOB: August 11, 1960  MRN: [id number]  CC: Neck pain follow-up  Subjective: April Mcmahon is a 59 y.o. female  with primary history of essential hypertension, migraine, diabetes mellitus type 2, degenerative joint disease, low back pain, hyperlipidemia, and muscle cramps who presents for neck pain follow-up. Patient is accompanied by her daughter, Feliz Beam, who is assisting with Nepali interpretation.   1. NECK PAIN FOLLOW-UP:  Location: neck and bilateral shoulders Onset: November 2020 Description: 7/10 pain Modifying factors: Any movement aggravates pain.Gabapentin helping with pain.   Symptoms Back Pain:  Yes Numbness/tingling: Tingling back  Weakness: Yes Overuse: Denies Trauma:  Yes, post-fall in November 2020 while trying to get off commode in bathroom, no falls since then Comments: Reports cramps in back. Denies chest pain, shortness of breath, and fever. States that she has been going to PT at least 1 time per week. Reports that PT told her that she only has 3 visits left with Medicaid coverage.  Last seen by Dr. Wynetta Emery in December 2020 during that encounter patient was referred to physical therapy.  Red Flags Fever: Denies   Bowel/bladder incontinence: Denies  2. TOBACCO DEPENDENCE FOLLOW-UP:   Denies smoking but reports 2 days ago she smoked cigarettes because she ran out of patches. Requesting refills.  Last seen in October 2020 by Dr. Wynetta Emery during that encounter patient was prescribed nicotine patch.  Patient Active Problem List   Diagnosis Date Noted  . Microalbuminuria 11/15/2018  . Muscle cramps 02/21/2018  . Hepatic steatosis 12/23/2017  . MCI (mild cognitive impairment) 02/14/2017  . Diabetic peripheral neuropathy ([place]) 02/14/2017  . Memory loss 12/27/2016  . Dysfunctional uterine bleeding 08/30/2016  . Analgesic rebound headache 06/24/2016  . Esophageal dysphagia 06/21/2016  . Tobacco dependence 01/20/2016  . Plantar  fasciitis, bilateral 12/24/2015  . 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 Diabetes type 2, controlled ([place]) 06/19/2015  . Essential hypertension 06/19/2015  . Vitamin D deficiency 09/24/2013  . Hyperlipidemia 09/24/2013  . Migraine 05/15/2013  . Low back pain 08/18/2012     Current Outpatient Medications on File Prior to Visit  Medication Sig Dispense Refill  . ACCU-CHEK SOFTCLIX LANCETS lancets Use as instructed 100 each 12  . amLODipine (NORVASC) 10 MG tablet Take 1 tablet (10 mg total) by mouth daily. 30 tablet 6  . atorvastatin (LIPITOR) 10 MG tablet Take 1 tablet (10 mg total) by mouth daily. 30 tablet 6  . gabapentin (NEURONTIN) 300 MG capsule Take 1 capsule (300 mg total) by mouth 2 (two) times daily. 60 capsule 5  . glimepiride (AMARYL) 2 MG tablet TAKE ONE TABLET BY MOUTH DAILY BEFORE BREAKFAST 30 tablet 2  . glucose blood (ACCU-CHEK AVIVA) test strip Use as instructed 100 each 12  . ibuprofen (ADVIL) 600 MG tablet Take 1 tablet (600 mg total) by mouth every 6 (six) hours as needed. 30 tablet 0  . JARDIANCE 10 MG TABS tablet TAKE ONE TABLET BY MOUTH EVERY DAY 30 tablet 2  . lisinopril (ZESTRIL) 2.5 MG tablet TAKE TWO TABLETS BY MOUTH DAILY 60 tablet 2  . metFORMIN (GLUCOPHAGE) 1000 MG tablet TAKE ONE TABLET BY MOUTH TWICE DAILY WITH MEAL 60 tablet 6  . methocarbamol (ROBAXIN) 500 MG tablet Take 1 tablet (500 mg total) by mouth every 8 (eight) hours as needed for muscle spasms. 30 tablet 0  . nicotine (NICODERM CQ - DOSED IN MG/24 HOURS) 21 mg/24hr patch unwrap AND APPLY ONE PATCH  TO THE SKIN DAILY 28 patch 1  . sitaGLIPtin (JANUVIA) 50 MG tablet Take 1 tablet (50 mg total) by mouth daily. 30 tablet 5  . VOLTAREN 1 % GEL APPLY TWO GRAMS EXTERNALLY TO AFFECTED AREA FOUR TIMES DAILY 100 g 3  . [DISCONTINUED] fluticasone (FLONASE) 50 MCG/ACT nasal spray Place 1 spray into both nostrils daily. 16 g 2  . [DISCONTINUED] SUMAtriptan (IMITREX) 100 MG tablet Take 1 tablet  (100 mg total) by mouth as needed for migraine. May repeat in 2 hours if headache persists or recurs.  Do not refill in less than 30 days (Patient not taking: Reported on 11/29/2018) 10 tablet 6  . [DISCONTINUED] venlafaxine (EFFEXOR) 37.5 MG tablet Take 1 tablet (37.5 mg total) by mouth 2 (two) times daily with a meal. 60 tablet 11   No current facility-administered medications on file prior to visit.    Allergies  Allergen Reactions  . Elavil [Amitriptyline Hcl] Diarrhea and Nausea And Vomiting    Social History   Socioeconomic History  . Marital status: Married    Spouse name: Not on file  . Number of children: Not on file  . Years of education: Not on file  . Highest education level: Not on file  Occupational History  . Not on file  Tobacco Use  . Smoking status: Former Research scientist (life sciences)  . Smokeless tobacco: Never Used  Substance and Sexual Activity  . Alcohol use: No  . Drug use: No  . Sexual activity: Not on file  Other Topics Concern  . Not on file  Social History Narrative  . Not on file   Social Determinants of Health   Financial Resource Strain:   . Difficulty of Paying Living Expenses: Not on file  Food Insecurity:   . Worried About Charity fundraiser in the Last Year: Not on file  . Ran Out of Food in the Last Year: Not on file  Transportation Needs:   . Lack of Transportation (Medical): Not on file  . Lack of Transportation (Non-Medical): Not on file  Physical Activity:   . Days of Exercise per Week: Not on file  . Minutes of Exercise per Session: Not on file  Stress:   . Feeling of Stress : Not on file  Social Connections:   . Frequency of Communication with Friends and Family: Not on file  . Frequency of Social Gatherings with Friends and Family: Not on file  . Attends Religious Services: Not on file  . Active Member of Clubs or Organizations: Not on file  . Attends Archivist Meetings: Not on file  . Marital Status: Not on file  Intimate  Partner Violence:   . Fear of Current or Ex-Partner: Not on file  . Emotionally Abused: Not on file  . Physically Abused: Not on file  . Sexually Abused: Not on file    Family History  Problem Relation Age of Onset  . Migraines Mother   . Esophageal cancer Other   . Breast cancer Neg Hx   . Colon cancer Neg Hx   . Rectal cancer Neg Hx   . Stomach cancer Neg Hx     Past Surgical History:  Procedure Laterality Date  . no previous surgeries      ROS: Review of Systems  Constitutional: Negative for fever.  HENT: Negative for sinus pain and sore throat.   Eyes: Negative for blurred vision, double vision and pain.  Respiratory: Negative for cough, shortness of breath and wheezing.  Cardiovascular: Negative for chest pain, palpitations and leg swelling.  Gastrointestinal: Negative for heartburn, nausea and vomiting.  Musculoskeletal: Positive for neck pain.       Bilateral shoulder pain.  Neurological: Negative for sensory change, speech change and headaches.  Negative except as stated above  PHYSICAL EXAM: Vitals with BMI 03/15/2019 01/10/2019 01/10/2019  Height - - -  Weight 157 lbs - -  BMI - - -  Systolic [id number]  Diastolic 75 69 55  Pulse 75 54 60  Temperature: 97.8 F SpO2: 98%  Physical Exam General appearance - alert, well appearing, and in no distress and oriented to person, place, and time Eyes - pupils equal and reactive, extraocular eye movements intact Neck - supple, no significant adenopathy, carotids upstroke normal bilaterally, no bruits, 4/5 neck strength, decreased range of motion Lymphatics - no palpable lymphadenopathy, no hepatosplenomegaly Chest - clear to auscultation, no wheezes, rales or rhonchi, symmetric air entry, no tachypnea, retractions or cyanosis Heart - normal rate, regular rhythm, normal S1, S2, no murmurs, rubs, clicks or gallops Neurological - alert, oriented, normal speech, no focal findings or movement disorder noted, screening  mental status exam normal, neck supple without rigidity, cranial nerves II through XII intact, normal muscle tone, no tremors, strength 5/5  CMP Latest Ref Rng & Units 11/13/2018 03/07/2018 12/23/2017  Glucose 65 - 99 mg/dL 154(H) 94 -  BUN 6 - 24 mg/dL 10 10 -  Creatinine 0.57 - 1.00 mg/dL 0.70 0.67 -  Sodium 134 - 144 mmol/L 140 140 -  Potassium 3.5 - 5.2 mmol/L 4.5 4.7 -  Chloride 96 - 106 mmol/L 102 103 -  CO2 20 - 29 mmol/L 23 22 -  Calcium 8.7 - 10.2 mg/dL 9.9 9.6 -  Total Protein 6.0 - 8.5 g/dL 7.9 7.6 7.2  Total Bilirubin 0.0 - 1.2 mg/dL 0.6 0.5 0.3  Alkaline Phos 39 - 117 IU/L 181(H) 139(H) 137(H)  AST 0 - 40 IU/L 31 28 55(H)  ALT 0 - 32 IU/L 43(H) 36(H) 73(H)   Lipid Panel     Component Value Date/Time   CHOL 165 11/13/2018 1027   TRIG 204 (H) 11/13/2018 1027   HDL 57 11/13/2018 1027   CHOLHDL 2.9 11/13/2018 1027   CHOLHDL 3.3 10/20/2015 1625   VLDL 46 (H) 10/20/2015 1625   LDLCALC 74 11/13/2018 1027    CBC    Component Value Date/Time   WBC 7.9 11/13/2018 1027   WBC 9.7 10/10/2017 1941   RBC 5.35 (H) 11/13/2018 1027   RBC 4.81 10/10/2017 1941   HGB 14.3 11/13/2018 1027   HCT 44.3 11/13/2018 1027   PLT 235 11/13/2018 1027   MCV 83 11/13/2018 1027   MCH 26.7 11/13/2018 1027   MCH 26.4 10/10/2017 1941   MCHC 32.3 11/13/2018 1027   MCHC 31.0 10/10/2017 1941   RDW 13.2 11/13/2018 1027   LYMPHSABS 2,948 10/20/2015 1625   MONOABS 335 10/20/2015 1625   EOSABS 268 10/20/2015 1625   BASOSABS 0 10/20/2015 1625    ASSESSMENT AND PLAN: 1. Cervicalgia:  -Continue physical therapy  - naproxen (NAPROSYN) 500 MG tablet; Take 1 tablet (500 mg total) by mouth 2 (two) times daily with a meal.  Dispense: 60 tablet; Refill: 0  2. Tobacco dependence: - nicotine (NICODERM CQ - DOSED IN MG/24 HOURS) 21 mg/24hr patch; unwrap AND APPLY ONE PATCH TO THE SKIN DAILY  Dispense: 28 patch; Refill: 1 -Encourage patient to remain tobacco free.   Patient was given the  opportunity to  ask questions.  Patient verbalized understanding of the plan and was able to repeat key elements of the plan. Patient was given clear instructions to go to Emergency Department or return to medical center if symptoms don't improve, worsen, or new problems develop.The patient verbalized understanding.   No orders of the defined types were placed in this encounter.    Requested Prescriptions    No prescriptions requested or ordered in this encounter    Follow-up with attending physician as needed for management of chronic issue.  Camillia Herter, NP

## 2019-03-16 DIAGNOSIS — G43109 Migraine with aura, not intractable, without status migrainosus: Secondary | ICD-10-CM | POA: Diagnosis not present

## 2019-03-17 DIAGNOSIS — G43109 Migraine with aura, not intractable, without status migrainosus: Secondary | ICD-10-CM | POA: Diagnosis not present

## 2019-03-18 DIAGNOSIS — G43109 Migraine with aura, not intractable, without status migrainosus: Secondary | ICD-10-CM | POA: Diagnosis not present

## 2019-03-19 ENCOUNTER — Ambulatory Visit: Payer: Medicaid Other | Admitting: Physical Therapy

## 2019-03-19 DIAGNOSIS — G43109 Migraine with aura, not intractable, without status migrainosus: Secondary | ICD-10-CM | POA: Diagnosis not present

## 2019-03-20 DIAGNOSIS — G43109 Migraine with aura, not intractable, without status migrainosus: Secondary | ICD-10-CM | POA: Diagnosis not present

## 2019-03-21 DIAGNOSIS — G43109 Migraine with aura, not intractable, without status migrainosus: Secondary | ICD-10-CM | POA: Diagnosis not present

## 2019-03-22 ENCOUNTER — Ambulatory Visit: Payer: Medicaid Other | Admitting: Physical Therapy

## 2019-03-22 DIAGNOSIS — G43109 Migraine with aura, not intractable, without status migrainosus: Secondary | ICD-10-CM | POA: Diagnosis not present

## 2019-03-23 DIAGNOSIS — G43109 Migraine with aura, not intractable, without status migrainosus: Secondary | ICD-10-CM | POA: Diagnosis not present

## 2019-03-24 DIAGNOSIS — G43109 Migraine with aura, not intractable, without status migrainosus: Secondary | ICD-10-CM | POA: Diagnosis not present

## 2019-03-25 DIAGNOSIS — G43109 Migraine with aura, not intractable, without status migrainosus: Secondary | ICD-10-CM | POA: Diagnosis not present

## 2019-03-26 ENCOUNTER — Ambulatory Visit: Payer: Medicaid Other | Admitting: Physical Therapy

## 2019-03-26 ENCOUNTER — Encounter: Payer: Self-pay | Admitting: Physical Therapy

## 2019-03-26 ENCOUNTER — Other Ambulatory Visit: Payer: Self-pay

## 2019-03-26 DIAGNOSIS — G8929 Other chronic pain: Secondary | ICD-10-CM

## 2019-03-26 DIAGNOSIS — G43109 Migraine with aura, not intractable, without status migrainosus: Secondary | ICD-10-CM | POA: Diagnosis not present

## 2019-03-26 DIAGNOSIS — M62838 Other muscle spasm: Secondary | ICD-10-CM

## 2019-03-26 DIAGNOSIS — M542 Cervicalgia: Secondary | ICD-10-CM | POA: Diagnosis not present

## 2019-03-26 NOTE — Therapy (Signed)
[place], [place], [address] Phone: [phone]   Fax:  [phone]  Physical Therapy Treatment/Discharge   Patient Details  Name: April Mcmahon MRN: [id number] Date of Birth: [birth year] Referring Provider (PT): Dr Frankey Shown    Encounter Date: 03/26/2019  PT End of Session - 03/26/19 1158    Visit Number  6    Number of Visits  6    Date for PT Re-Evaluation  04/23/19    Authorization Type  MCD    Authorization Time Period  02/05/19-02/25/19    Authorization - Visit Number  1    Authorization - Number of Visits  3    PT Start Time  1151    PT Stop Time  1239    PT Time Calculation (min)  48 min    Activity Tolerance  Patient tolerated treatment well    Behavior During Therapy  [facility] for tasks assessed/performed       Past Medical History:  Diagnosis Date  . Bilateral chronic knee pain 06/2014  . Chronic low back pain 06/2014  . Chronic migraine 02/1985  . Depression    hx of  . Diabetes mellitus without complication ([place]) 65/0354  . Fall   . Hyperlipidemia   . Hypertension 09/2013  . Shoulder pain     Past Surgical History:  Procedure Laterality Date  . no previous surgeries      There were no vitals filed for this visit.  Subjective Assessment - 03/26/19 1339    Subjective  Patient reports just minor pain in her right upper trap. she feels like she is doing much better. She is not having pain today.    Patient is accompained by:  Interpreter         [facility] PT Assessment - 03/26/19 0001      AROM   Left Shoulder Flexion  120 Degrees    Cervical - Right Rotation  55    Cervical - Left Rotation  55      Strength   Right Shoulder Flexion  4/5    Right Shoulder ABduction  4/5    Right Shoulder External Rotation  4/5                   [facility] Adult PT Treatment/Exercise - 03/26/19 0001      Self-Care   Other Self-Care Comments   reviewed progression of exercises.  Reviewed use of trigger point shepards hook.       Neck Exercises: Standing   Other Standing Exercises  scap retraction red; showed patient how to put band in the door and shoulder extension yellow 2x10;       Neck Exercises: Seated   Other Seated Exercise  bilateral er and horizontal abduction less cuing required today.       Moist Heat Therapy   Number Minutes Moist Heat  6 Minutes   heat while printing HEP    Moist Heat Location  Cervical      Manual Therapy   Manual Therapy  Soft tissue mobilization;Manual Traction    Soft tissue mobilization  trigger point reelase to upper trap and cervical paraspinals    Manual Traction  gentle cervical traction and sub-occipital release       Neck Exercises: Stretches   Upper Trapezius Stretch  3 reps;20 seconds    Levator Stretch  2 reps;20 seconds               PT  Short Term Goals - 03/26/19 1428      PT SHORT TERM GOAL #1   Title  Patient will increase bilateral cervical rotation by 20 degrees    Baseline  improved right 55 left 55 but goal not met    Time  3    Period  Weeks    Status  Achieved    Target Date  03/14/19      PT SHORT TERM GOAL #2   Title  Patient will increase left shoulder active flexion by 20 degrees    Baseline  125 baseline 85    Time  3    Period  Weeks    Status  Achieved    Target Date  03/14/19      PT SHORT TERM GOAL #3   Title  Patient will increase rleft shoulder flexion strength to 4/5    Baseline  4/5 right shoulder flexion    Time  3    Period  Weeks    Status  Achieved    Target Date  02/07/19        PT Long Term Goals - 03/26/19 1434      PT LONG TERM GOAL #1   Title  Patient will reach overhead to a shelf without pain in order to perfrom ADL's    Baseline  perfroming exercises    Time  3    Period  Weeks    Status  Achieved      PT LONG TERM GOAL #2   Title  Patient will deomonstrate 60 degrees of cervical rotation without pain in order to increase community safety     Baseline  55 but able to ambulate in the community safely    Time  6    Period  Weeks    Status  Achieved            Plan - 03/26/19 1421    Clinical Impression Statement  Patient has progressed well. Her cervical motion has improved significantly. She still has minor pain in her right upper trap. all of her other pain has resolved for now. She has been working on her stretches and exercises. STherapy showed her hopw to use a thera-cane. She feels comfortable with HEP at this time. She will be discharged to HEP. Therapy reviewed activity progression.    Personal Factors and Comorbidities  Comorbidity 1;Comorbidity 2;Comorbidity 3+;Fitness    Comorbidities  chronic migrane, Low back pain, bilateral knee pain, falls    Examination-Activity Limitations  Locomotion Level;Squat;Lift;Dressing;Carry;Sleep    Examination-Participation Restrictions  Cleaning;Community Activity;Other    Stability/Clinical Decision Making  Evolving/Moderate complexity    Clinical Decision Making  Moderate    Rehab Potential  Fair    PT Frequency  1x / week    PT Duration  3 weeks    PT Treatment/Interventions  ADLs/Self Care Home Management;Cryotherapy;Electrical Stimulation;Iontophoresis '4mg'$ /ml Dexamethasone;Ultrasound;DME Instruction;Therapeutic activities;Functional mobility training;Therapeutic exercise;Patient/family education;Manual techniques;Neuromuscular re-education;Passive range of motion;Taping;Dry needling    PT Next Visit Plan  continue with manual therapy; consider taping of the upper trap. continue with stretching and postural exercises. continue clvical mobilization    PT Home Exercise Plan  upper trap stretch; levaotr stretch; scpa retraction max cuing for tehcnique required for all    Consulted and Agree with Plan of Care  Patient       Patient will benefit from skilled therapeutic intervention in order to improve the following deficits and impairments:  Decreased activity tolerance, Decreased  range of motion, Decreased safety  awareness, Decreased strength, Increased fascial restricitons, Increased muscle spasms, Impaired UE functional use, Impaired sensation, Improper body mechanics, Postural dysfunction, Obesity, Pain  Visit Diagnosis: Cervicalgia  Chronic left shoulder pain  Other muscle spasm     Problem List Patient Active Problem List   Diagnosis Date Noted  . Microalbuminuria 11/15/2018  . Muscle cramps 02/21/2018  . Hepatic steatosis 12/23/2017  . MCI (mild cognitive impairment) 02/14/2017  . Diabetic peripheral neuropathy ([place]) 02/14/2017  . Memory loss 12/27/2016  . Dysfunctional uterine bleeding 08/30/2016  . Analgesic rebound headache 06/24/2016  . Esophageal dysphagia 06/21/2016  . Tobacco dependence 01/20/2016  . Plantar fasciitis, bilateral 12/24/2015  . 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 Diabetes type 2, controlled ([place]) 06/19/2015  . Essential hypertension 06/19/2015  . Vitamin D deficiency 09/24/2013  . Hyperlipidemia 09/24/2013  . Migraine 05/15/2013  . Low back pain 08/18/2012   PHYSICAL THERAPY DISCHARGE SUMMARY  Visits from Start of Care: 6  Current functional level related to goals / functional outcomes: Improved shoulder motion and neck motion. Improved shoulder strength    Remaining deficits: minor pain in the upper trap    Education / Equipment: HEP  Plan: Patient agrees to discharge.  Patient goals were met. Patient is being discharged due to meeting the stated rehab goals.  ?????      Carney Living 03/26/2019, 2:37 PM  [facility] [address] [place], [place], [address] Phone: [phone]   Fax:  [phone]  Name: April Mcmahon MRN: [id number] Date of Birth: 06/19/1960

## 2019-03-27 DIAGNOSIS — G43109 Migraine with aura, not intractable, without status migrainosus: Secondary | ICD-10-CM | POA: Diagnosis not present

## 2019-03-28 DIAGNOSIS — G43109 Migraine with aura, not intractable, without status migrainosus: Secondary | ICD-10-CM | POA: Diagnosis not present

## 2019-03-29 DIAGNOSIS — G43109 Migraine with aura, not intractable, without status migrainosus: Secondary | ICD-10-CM | POA: Diagnosis not present

## 2019-03-30 DIAGNOSIS — G43109 Migraine with aura, not intractable, without status migrainosus: Secondary | ICD-10-CM | POA: Diagnosis not present

## 2019-03-31 DIAGNOSIS — G43109 Migraine with aura, not intractable, without status migrainosus: Secondary | ICD-10-CM | POA: Diagnosis not present

## 2019-04-01 DIAGNOSIS — G43109 Migraine with aura, not intractable, without status migrainosus: Secondary | ICD-10-CM | POA: Diagnosis not present

## 2019-04-02 ENCOUNTER — Other Ambulatory Visit: Payer: Self-pay | Admitting: Internal Medicine

## 2019-04-02 DIAGNOSIS — G43109 Migraine with aura, not intractable, without status migrainosus: Secondary | ICD-10-CM | POA: Diagnosis not present

## 2019-04-02 DIAGNOSIS — E1142 Type 2 diabetes mellitus with diabetic polyneuropathy: Secondary | ICD-10-CM

## 2019-04-02 DIAGNOSIS — S161XXA Strain of muscle, fascia and tendon at neck level, initial encounter: Secondary | ICD-10-CM

## 2019-04-02 DIAGNOSIS — IMO0002 Reserved for concepts with insufficient information to code with codable children: Secondary | ICD-10-CM

## 2019-04-02 DIAGNOSIS — E1165 Type 2 diabetes mellitus with hyperglycemia: Secondary | ICD-10-CM

## 2019-04-03 DIAGNOSIS — G43109 Migraine with aura, not intractable, without status migrainosus: Secondary | ICD-10-CM | POA: Diagnosis not present

## 2019-04-04 DIAGNOSIS — G43109 Migraine with aura, not intractable, without status migrainosus: Secondary | ICD-10-CM | POA: Diagnosis not present

## 2019-04-05 DIAGNOSIS — G43109 Migraine with aura, not intractable, without status migrainosus: Secondary | ICD-10-CM | POA: Diagnosis not present

## 2019-04-06 DIAGNOSIS — G43109 Migraine with aura, not intractable, without status migrainosus: Secondary | ICD-10-CM | POA: Diagnosis not present

## 2019-04-07 DIAGNOSIS — G43109 Migraine with aura, not intractable, without status migrainosus: Secondary | ICD-10-CM | POA: Diagnosis not present

## 2019-04-08 DIAGNOSIS — G43109 Migraine with aura, not intractable, without status migrainosus: Secondary | ICD-10-CM | POA: Diagnosis not present

## 2019-04-09 DIAGNOSIS — G43109 Migraine with aura, not intractable, without status migrainosus: Secondary | ICD-10-CM | POA: Diagnosis not present

## 2019-04-10 DIAGNOSIS — G43109 Migraine with aura, not intractable, without status migrainosus: Secondary | ICD-10-CM | POA: Diagnosis not present

## 2019-04-11 DIAGNOSIS — G43109 Migraine with aura, not intractable, without status migrainosus: Secondary | ICD-10-CM | POA: Diagnosis not present

## 2019-04-12 DIAGNOSIS — G43109 Migraine with aura, not intractable, without status migrainosus: Secondary | ICD-10-CM | POA: Diagnosis not present

## 2019-04-13 DIAGNOSIS — G43109 Migraine with aura, not intractable, without status migrainosus: Secondary | ICD-10-CM | POA: Diagnosis not present

## 2019-04-14 DIAGNOSIS — G43109 Migraine with aura, not intractable, without status migrainosus: Secondary | ICD-10-CM | POA: Diagnosis not present

## 2019-04-15 DIAGNOSIS — G43109 Migraine with aura, not intractable, without status migrainosus: Secondary | ICD-10-CM | POA: Diagnosis not present

## 2019-04-16 ENCOUNTER — Ambulatory Visit: Payer: Medicaid Other | Admitting: Internal Medicine

## 2019-04-16 DIAGNOSIS — G43109 Migraine with aura, not intractable, without status migrainosus: Secondary | ICD-10-CM | POA: Diagnosis not present

## 2019-04-17 DIAGNOSIS — G43109 Migraine with aura, not intractable, without status migrainosus: Secondary | ICD-10-CM | POA: Diagnosis not present

## 2019-04-18 ENCOUNTER — Ambulatory Visit (HOSPITAL_COMMUNITY)
Admission: EM | Admit: 2019-04-18 | Discharge: 2019-04-18 | Disposition: A | Discharge status: Home / Self Care | Payer: Medicaid Other | Attending: Family Medicine | Admitting: Family Medicine

## 2019-04-18 ENCOUNTER — Other Ambulatory Visit: Payer: Self-pay

## 2019-04-18 ENCOUNTER — Encounter (HOSPITAL_COMMUNITY): Payer: Self-pay

## 2019-04-18 DIAGNOSIS — L03012 Cellulitis of left finger: Secondary | ICD-10-CM

## 2019-04-18 DIAGNOSIS — G43109 Migraine with aura, not intractable, without status migrainosus: Secondary | ICD-10-CM | POA: Diagnosis not present

## 2019-04-18 MED ORDER — ACETAMINOPHEN 325 MG PO TABS: 975.0000 mg | ORAL_TABLET | Freq: Once | ORAL | Status: DC

## 2019-04-18 MED ORDER — IBUPROFEN 600 MG PO TABS: 600.0000 mg | ORAL_TABLET | Freq: Four times a day (QID) | ORAL | 0 refills | Status: DC | PRN

## 2019-04-18 MED ORDER — CEPHALEXIN 500 MG PO CAPS: 500.0000 mg | ORAL_CAPSULE | Freq: Four times a day (QID) | ORAL | 0 refills | Status: DC

## 2019-04-18 NOTE — ED Triage Notes (Signed)
Pt presents with infection in left ring finger X 5 days.

## 2019-04-18 NOTE — Discharge Instructions (Addendum)
Treating for infection with Keflex.  Take this as prescribed. Tylenol given here for pain Sent ibuprofen for pain to the pharmacy. Follow up as needed for continued or worsening symptoms

## 2019-04-18 NOTE — ED Provider Notes (Signed)
[place]    CSN: [id number] Arrival date & time: 04/18/19  1254      History   Chief Complaint Chief Complaint  Patient presents with  . Finger Infection    HPI April Mcmahon is a 59 y.o. female.   Patient is a 59 year old female past medical history of diabetes.  She presents today with left ring finger pain, swelling and infection.  Symptoms have been constant and worsening.  She has not taken anything for the pain.  Denies any injuries to the finger, fevers.  ROS per HPI      Past Medical History:  Diagnosis Date  . Bilateral chronic knee pain 06/2014  . Chronic low back pain 06/2014  . Chronic migraine 02/1985  . Depression    hx of  . Diabetes mellitus without complication ([place]) [id number]  . Fall   . Hyperlipidemia   . Hypertension 09/2013  . Shoulder pain     Patient Active Problem List   Diagnosis Date Noted  . Microalbuminuria 11/15/2018  . Muscle cramps 02/21/2018  . Hepatic steatosis 12/23/2017  . MCI (mild cognitive impairment) 02/14/2017  . Diabetic peripheral neuropathy ([place]) 02/14/2017  . Memory loss 12/27/2016  . Dysfunctional uterine bleeding 08/30/2016  . Analgesic rebound headache 06/24/2016  . Esophageal dysphagia 06/21/2016  . Tobacco dependence 01/20/2016  . Plantar fasciitis, bilateral 12/24/2015  . 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 Diabetes type 2, controlled ([place]) 06/19/2015  . Essential hypertension 06/19/2015  . Vitamin D deficiency 09/24/2013  . Hyperlipidemia 09/24/2013  . Migraine 05/15/2013  . Low back pain 08/18/2012    Past Surgical History:  Procedure Laterality Date  . no previous surgeries      OB History   No obstetric history on file.      Home Medications    Prior to Admission medications   Medication Sig Start Date End Date Taking? Authorizing Provider  ACCU-CHEK AVIVA PLUS test strip AS DIRECTED 04/03/19   Ladell Pier, MD  Accu-Chek Softclix Lancets lancets  AS DIRECTED 04/03/19   Ladell Pier, MD  amLODipine (NORVASC) 10 MG tablet Take 1 tablet (10 mg total) by mouth daily. 11/13/18   Ladell Pier, MD  atorvastatin (LIPITOR) 10 MG tablet Take 1 tablet (10 mg total) by mouth daily. 11/13/18   Ladell Pier, MD  cephALEXin (KEFLEX) 500 MG capsule Take 1 capsule (500 mg total) by mouth 4 (four) times daily. 04/18/19   Loura Halt A, NP  gabapentin (NEURONTIN) 300 MG capsule Take 1 capsule (300 mg total) by mouth 2 (two) times daily. 12/12/18   Ladell Pier, MD  glimepiride (AMARYL) 2 MG tablet TAKE ONE TABLET BY MOUTH DAILY BEFORE BREAKFAST 04/03/19   Ladell Pier, MD  ibuprofen (ADVIL) 600 MG tablet Take 1 tablet (600 mg total) by mouth every 6 (six) hours as needed. 04/18/19   Orvan July, NP  JANUVIA 50 MG tablet TAKE ONE TABLET BY MOUTH DAILY 04/03/19   Ladell Pier, MD  JARDIANCE 10 MG TABS tablet TAKE ONE TABLET BY MOUTH EVERY DAY 01/10/19   Ladell Pier, MD  lisinopril (ZESTRIL) 2.5 MG tablet TAKE TWO TABLETS BY MOUTH DAILY 04/03/19   Ladell Pier, MD  metFORMIN (GLUCOPHAGE) 1000 MG tablet TAKE ONE TABLET BY MOUTH TWICE DAILY WITH MEAL 11/13/18   Ladell Pier, MD  methocarbamol (ROBAXIN) 500 MG tablet Take 1 tablet (500 mg total) by mouth every 8 (eight) hours  as needed for muscle spasms. 12/14/18   Chase Picket, MD  naproxen (NAPROSYN) 500 MG tablet Take 1 tablet (500 mg total) by mouth 2 (two) times daily with a meal. 03/15/19   Camillia Herter, NP  nicotine (NICODERM CQ - DOSED IN MG/24 HOURS) 21 mg/24hr patch unwrap AND APPLY ONE PATCH TO THE SKIN DAILY 03/15/19   Camillia Herter, NP  VOLTAREN 1 % GEL APPLY TWO GRAMS EXTERNALLY TO AFFECTED AREA FOUR TIMES DAILY 08/17/18   Ladell Pier, MD  fluticasone (FLONASE) 50 MCG/ACT nasal spray Place 1 spray into both nostrils daily. 10/13/16 12/14/18  Argentina Donovan, PA-C  SUMAtriptan (IMITREX) 100 MG tablet Take 1 tablet (100 mg total) by mouth as  needed for migraine. May repeat in 2 hours if headache persists or recurs.  Do not refill in less than 30 days Patient not taking: Reported on 11/29/2018 02/21/18 12/14/18  Ladell Pier, MD  venlafaxine [facility]) 37.5 MG tablet Take 1 tablet (37.5 mg total) by mouth 2 (two) times daily with a meal. 02/09/17 12/14/18  Marcial Pacas, MD    Family History Family History  Problem Relation Age of Onset  . Migraines Mother   . Esophageal cancer Other   . Breast cancer Neg Hx   . Colon cancer Neg Hx   . Rectal cancer Neg Hx   . Stomach cancer Neg Hx     Social History Social History   Tobacco Use  . Smoking status: Former Research scientist (life sciences)  . Smokeless tobacco: Never Used  Substance Use Topics  . Alcohol use: No  . Drug use: No     Allergies   Elavil [amitriptyline hcl]   Review of Systems Review of Systems   Physical Exam Triage Vital Signs ED Triage Vitals [04/18/19 1339]  Enc Vitals Group     BP 117/65     Pulse Rate 70     Resp 17     Temp 98.7 F (37.1 C)     Temp Source Oral     SpO2 100 %     Weight      Height      Head Circumference      Peak Flow      Pain Score 10     Pain Loc      Pain Edu?      Excl. in [place]?    No data found.  Updated Vital Signs BP 117/65 (BP Location: Left Arm)   Pulse 70   Temp 98.7 F (37.1 C) (Oral)   Resp 17   SpO2 100%   Visual Acuity Right Eye Distance:   Left Eye Distance:   Bilateral Distance:    Right Eye Near:   Left Eye Near:    Bilateral Near:     Physical Exam Vitals and nursing note reviewed.  Constitutional:      General: She is not in acute distress.    Appearance: Normal appearance. She is not ill-appearing, toxic-appearing or diaphoretic.  HENT:     Head: Normocephalic.     Nose: Nose normal.  Eyes:     Conjunctiva/sclera: Conjunctivae normal.  Pulmonary:     Effort: Pulmonary effort is normal.  Musculoskeletal:        General: Normal range of motion.     Cervical back: Normal range of motion.    Skin:    General: Skin is warm and dry.     Findings: No rash.     Comments: Paronychia left  ring finger  Neurological:     Mental Status: She is alert.  Psychiatric:        Mood and Affect: Mood normal.      UC Treatments / Results  Labs (all labs ordered are listed, but only abnormal results are displayed) Labs Reviewed - No data to display  EKG   Radiology No results found.  Procedures Incision and Drainage  Date/Time: 04/18/2019 2:07 PM Performed by: Orvan July, NP Authorized by: Orvan July, NP   Consent:    Consent obtained:  Verbal   Consent given by:  Patient   Risks discussed:  Bleeding, incomplete drainage and pain   Alternatives discussed:  No treatment and observation Location:    Indications for incision and drainage: Paronychia.   Location:  Upper extremity   Upper extremity location:  Finger   Finger location:  L ring finger Anesthesia (see MAR for exact dosages):    Anesthesia method:  Topical application Procedure type:    Complexity:  Simple Procedure details:    Incision types:  Stab incision   Scalpel blade:  11   Drainage:  Bloody and purulent   Drainage amount:  Scant   Wound treatment:  Wound left open   Packing materials:  None Post-procedure details:    Patient tolerance of procedure:  Tolerated well, no immediate complications   (including critical care time)  Medications Ordered in UC Medications  acetaminophen (TYLENOL) tablet 975 mg (has no administration in time range)    Initial Impression / Assessment and Plan / UC Course  I have reviewed the triage vital signs and the nursing notes.  Pertinent labs & imaging results that were available during my care of the patient were reviewed by me and considered in my medical decision making (see chart for details).     Paronychia-I&D here in clinic.  Treating with Keflex for infection Tylenol given here for pain and ibuprofen at home for pain as needed Follow up as  needed for continued or worsening symptoms   Final Clinical Impressions(s) / UC Diagnoses   Final diagnoses:  Paronychia of finger, left     Discharge Instructions     Treating for infection with Keflex.  Take this as prescribed. Tylenol given here for pain Sent ibuprofen for pain to the pharmacy. Follow up as needed for continued or worsening symptoms     ED Prescriptions    Medication Sig Dispense Auth. Provider   cephALEXin (KEFLEX) 500 MG capsule Take 1 capsule (500 mg total) by mouth 4 (four) times daily. 28 capsule Monike Bragdon A, NP   ibuprofen (ADVIL) 600 MG tablet Take 1 tablet (600 mg total) by mouth every 6 (six) hours as needed. 30 tablet Loura Halt A, NP     PDMP not reviewed this encounter.   Loura Halt A, NP 04/18/19 1408

## 2019-04-19 DIAGNOSIS — G43109 Migraine with aura, not intractable, without status migrainosus: Secondary | ICD-10-CM | POA: Diagnosis not present

## 2019-04-20 DIAGNOSIS — G43109 Migraine with aura, not intractable, without status migrainosus: Secondary | ICD-10-CM | POA: Diagnosis not present

## 2019-04-21 DIAGNOSIS — G43109 Migraine with aura, not intractable, without status migrainosus: Secondary | ICD-10-CM | POA: Diagnosis not present

## 2019-04-22 DIAGNOSIS — G43109 Migraine with aura, not intractable, without status migrainosus: Secondary | ICD-10-CM | POA: Diagnosis not present

## 2019-04-23 DIAGNOSIS — G43109 Migraine with aura, not intractable, without status migrainosus: Secondary | ICD-10-CM | POA: Diagnosis not present

## 2019-04-24 DIAGNOSIS — G43109 Migraine with aura, not intractable, without status migrainosus: Secondary | ICD-10-CM | POA: Diagnosis not present

## 2019-04-25 DIAGNOSIS — G43109 Migraine with aura, not intractable, without status migrainosus: Secondary | ICD-10-CM | POA: Diagnosis not present

## 2019-04-26 DIAGNOSIS — G43109 Migraine with aura, not intractable, without status migrainosus: Secondary | ICD-10-CM | POA: Diagnosis not present

## 2019-04-27 DIAGNOSIS — G43109 Migraine with aura, not intractable, without status migrainosus: Secondary | ICD-10-CM | POA: Diagnosis not present

## 2019-04-28 DIAGNOSIS — G43109 Migraine with aura, not intractable, without status migrainosus: Secondary | ICD-10-CM | POA: Diagnosis not present

## 2019-04-29 DIAGNOSIS — G43109 Migraine with aura, not intractable, without status migrainosus: Secondary | ICD-10-CM | POA: Diagnosis not present

## 2019-04-30 DIAGNOSIS — G43109 Migraine with aura, not intractable, without status migrainosus: Secondary | ICD-10-CM | POA: Diagnosis not present

## 2019-05-01 DIAGNOSIS — G43109 Migraine with aura, not intractable, without status migrainosus: Secondary | ICD-10-CM | POA: Diagnosis not present

## 2019-05-02 DIAGNOSIS — G43109 Migraine with aura, not intractable, without status migrainosus: Secondary | ICD-10-CM | POA: Diagnosis not present

## 2019-05-03 DIAGNOSIS — G43109 Migraine with aura, not intractable, without status migrainosus: Secondary | ICD-10-CM | POA: Diagnosis not present

## 2019-05-04 DIAGNOSIS — G43109 Migraine with aura, not intractable, without status migrainosus: Secondary | ICD-10-CM | POA: Diagnosis not present

## 2019-05-05 DIAGNOSIS — G43109 Migraine with aura, not intractable, without status migrainosus: Secondary | ICD-10-CM | POA: Diagnosis not present

## 2019-05-06 DIAGNOSIS — G43109 Migraine with aura, not intractable, without status migrainosus: Secondary | ICD-10-CM | POA: Diagnosis not present

## 2019-05-07 DIAGNOSIS — G43109 Migraine with aura, not intractable, without status migrainosus: Secondary | ICD-10-CM | POA: Diagnosis not present

## 2019-05-08 DIAGNOSIS — G43109 Migraine with aura, not intractable, without status migrainosus: Secondary | ICD-10-CM | POA: Diagnosis not present

## 2019-05-09 DIAGNOSIS — G43109 Migraine with aura, not intractable, without status migrainosus: Secondary | ICD-10-CM | POA: Diagnosis not present

## 2019-05-10 DIAGNOSIS — G43109 Migraine with aura, not intractable, without status migrainosus: Secondary | ICD-10-CM | POA: Diagnosis not present

## 2019-05-11 DIAGNOSIS — G43109 Migraine with aura, not intractable, without status migrainosus: Secondary | ICD-10-CM | POA: Diagnosis not present

## 2019-05-12 DIAGNOSIS — G43109 Migraine with aura, not intractable, without status migrainosus: Secondary | ICD-10-CM | POA: Diagnosis not present

## 2019-05-13 DIAGNOSIS — G43109 Migraine with aura, not intractable, without status migrainosus: Secondary | ICD-10-CM | POA: Diagnosis not present

## 2019-05-14 DIAGNOSIS — G43109 Migraine with aura, not intractable, without status migrainosus: Secondary | ICD-10-CM | POA: Diagnosis not present

## 2019-05-15 DIAGNOSIS — G43109 Migraine with aura, not intractable, without status migrainosus: Secondary | ICD-10-CM | POA: Diagnosis not present

## 2019-05-16 DIAGNOSIS — G43109 Migraine with aura, not intractable, without status migrainosus: Secondary | ICD-10-CM | POA: Diagnosis not present

## 2019-05-17 DIAGNOSIS — G43109 Migraine with aura, not intractable, without status migrainosus: Secondary | ICD-10-CM | POA: Diagnosis not present

## 2019-05-18 DIAGNOSIS — G43109 Migraine with aura, not intractable, without status migrainosus: Secondary | ICD-10-CM | POA: Diagnosis not present

## 2019-05-19 DIAGNOSIS — G43109 Migraine with aura, not intractable, without status migrainosus: Secondary | ICD-10-CM | POA: Diagnosis not present

## 2019-05-20 DIAGNOSIS — G43109 Migraine with aura, not intractable, without status migrainosus: Secondary | ICD-10-CM | POA: Diagnosis not present

## 2019-05-21 DIAGNOSIS — G43109 Migraine with aura, not intractable, without status migrainosus: Secondary | ICD-10-CM | POA: Diagnosis not present

## 2019-05-22 DIAGNOSIS — G43109 Migraine with aura, not intractable, without status migrainosus: Secondary | ICD-10-CM | POA: Diagnosis not present

## 2019-05-23 DIAGNOSIS — G43109 Migraine with aura, not intractable, without status migrainosus: Secondary | ICD-10-CM | POA: Diagnosis not present

## 2019-05-24 DIAGNOSIS — G43109 Migraine with aura, not intractable, without status migrainosus: Secondary | ICD-10-CM | POA: Diagnosis not present

## 2019-05-25 DIAGNOSIS — G43109 Migraine with aura, not intractable, without status migrainosus: Secondary | ICD-10-CM | POA: Diagnosis not present

## 2019-05-26 DIAGNOSIS — G43109 Migraine with aura, not intractable, without status migrainosus: Secondary | ICD-10-CM | POA: Diagnosis not present

## 2019-05-27 DIAGNOSIS — G43109 Migraine with aura, not intractable, without status migrainosus: Secondary | ICD-10-CM | POA: Diagnosis not present

## 2019-05-28 ENCOUNTER — Other Ambulatory Visit: Payer: Self-pay | Admitting: Internal Medicine

## 2019-05-28 DIAGNOSIS — E1165 Type 2 diabetes mellitus with hyperglycemia: Secondary | ICD-10-CM

## 2019-05-28 DIAGNOSIS — G43109 Migraine with aura, not intractable, without status migrainosus: Secondary | ICD-10-CM | POA: Diagnosis not present

## 2019-05-28 DIAGNOSIS — E1142 Type 2 diabetes mellitus with diabetic polyneuropathy: Secondary | ICD-10-CM

## 2019-05-28 DIAGNOSIS — IMO0002 Reserved for concepts with insufficient information to code with codable children: Secondary | ICD-10-CM

## 2019-05-29 ENCOUNTER — Other Ambulatory Visit: Payer: Self-pay | Admitting: Internal Medicine

## 2019-05-29 DIAGNOSIS — G43109 Migraine with aura, not intractable, without status migrainosus: Secondary | ICD-10-CM | POA: Diagnosis not present

## 2019-05-29 DIAGNOSIS — F17200 Nicotine dependence, unspecified, uncomplicated: Secondary | ICD-10-CM

## 2019-05-30 DIAGNOSIS — G43109 Migraine with aura, not intractable, without status migrainosus: Secondary | ICD-10-CM | POA: Diagnosis not present

## 2019-05-31 DIAGNOSIS — G43109 Migraine with aura, not intractable, without status migrainosus: Secondary | ICD-10-CM | POA: Diagnosis not present

## 2019-06-01 DIAGNOSIS — G43109 Migraine with aura, not intractable, without status migrainosus: Secondary | ICD-10-CM | POA: Diagnosis not present

## 2019-06-02 DIAGNOSIS — G43109 Migraine with aura, not intractable, without status migrainosus: Secondary | ICD-10-CM | POA: Diagnosis not present

## 2019-06-03 DIAGNOSIS — G43109 Migraine with aura, not intractable, without status migrainosus: Secondary | ICD-10-CM | POA: Diagnosis not present

## 2019-06-04 DIAGNOSIS — G43109 Migraine with aura, not intractable, without status migrainosus: Secondary | ICD-10-CM | POA: Diagnosis not present

## 2019-06-05 DIAGNOSIS — G43109 Migraine with aura, not intractable, without status migrainosus: Secondary | ICD-10-CM | POA: Diagnosis not present

## 2019-06-06 DIAGNOSIS — G43109 Migraine with aura, not intractable, without status migrainosus: Secondary | ICD-10-CM | POA: Diagnosis not present

## 2019-06-07 DIAGNOSIS — G43109 Migraine with aura, not intractable, without status migrainosus: Secondary | ICD-10-CM | POA: Diagnosis not present

## 2019-06-08 DIAGNOSIS — G43109 Migraine with aura, not intractable, without status migrainosus: Secondary | ICD-10-CM | POA: Diagnosis not present

## 2019-06-09 DIAGNOSIS — G43109 Migraine with aura, not intractable, without status migrainosus: Secondary | ICD-10-CM | POA: Diagnosis not present

## 2019-06-10 DIAGNOSIS — G43109 Migraine with aura, not intractable, without status migrainosus: Secondary | ICD-10-CM | POA: Diagnosis not present

## 2019-06-11 DIAGNOSIS — G43109 Migraine with aura, not intractable, without status migrainosus: Secondary | ICD-10-CM | POA: Diagnosis not present

## 2019-06-12 DIAGNOSIS — G43109 Migraine with aura, not intractable, without status migrainosus: Secondary | ICD-10-CM | POA: Diagnosis not present

## 2019-06-13 DIAGNOSIS — G43109 Migraine with aura, not intractable, without status migrainosus: Secondary | ICD-10-CM | POA: Diagnosis not present

## 2019-06-14 DIAGNOSIS — G43109 Migraine with aura, not intractable, without status migrainosus: Secondary | ICD-10-CM | POA: Diagnosis not present

## 2019-06-15 DIAGNOSIS — G43109 Migraine with aura, not intractable, without status migrainosus: Secondary | ICD-10-CM | POA: Diagnosis not present

## 2019-06-16 DIAGNOSIS — G43109 Migraine with aura, not intractable, without status migrainosus: Secondary | ICD-10-CM | POA: Diagnosis not present

## 2019-06-17 DIAGNOSIS — G43109 Migraine with aura, not intractable, without status migrainosus: Secondary | ICD-10-CM | POA: Diagnosis not present

## 2019-06-18 DIAGNOSIS — G43109 Migraine with aura, not intractable, without status migrainosus: Secondary | ICD-10-CM | POA: Diagnosis not present

## 2019-06-19 DIAGNOSIS — G43109 Migraine with aura, not intractable, without status migrainosus: Secondary | ICD-10-CM | POA: Diagnosis not present

## 2019-06-20 DIAGNOSIS — G43109 Migraine with aura, not intractable, without status migrainosus: Secondary | ICD-10-CM | POA: Diagnosis not present

## 2019-06-21 DIAGNOSIS — G43109 Migraine with aura, not intractable, without status migrainosus: Secondary | ICD-10-CM | POA: Diagnosis not present

## 2019-06-22 DIAGNOSIS — G43109 Migraine with aura, not intractable, without status migrainosus: Secondary | ICD-10-CM | POA: Diagnosis not present

## 2019-06-23 DIAGNOSIS — G43109 Migraine with aura, not intractable, without status migrainosus: Secondary | ICD-10-CM | POA: Diagnosis not present

## 2019-06-24 DIAGNOSIS — G43109 Migraine with aura, not intractable, without status migrainosus: Secondary | ICD-10-CM | POA: Diagnosis not present

## 2019-06-25 DIAGNOSIS — G43109 Migraine with aura, not intractable, without status migrainosus: Secondary | ICD-10-CM | POA: Diagnosis not present

## 2019-06-26 DIAGNOSIS — G43109 Migraine with aura, not intractable, without status migrainosus: Secondary | ICD-10-CM | POA: Diagnosis not present

## 2019-06-27 DIAGNOSIS — G43109 Migraine with aura, not intractable, without status migrainosus: Secondary | ICD-10-CM | POA: Diagnosis not present

## 2019-06-28 DIAGNOSIS — G43109 Migraine with aura, not intractable, without status migrainosus: Secondary | ICD-10-CM | POA: Diagnosis not present

## 2019-06-29 DIAGNOSIS — G43109 Migraine with aura, not intractable, without status migrainosus: Secondary | ICD-10-CM | POA: Diagnosis not present

## 2019-06-30 DIAGNOSIS — G43109 Migraine with aura, not intractable, without status migrainosus: Secondary | ICD-10-CM | POA: Diagnosis not present

## 2019-07-01 DIAGNOSIS — G43109 Migraine with aura, not intractable, without status migrainosus: Secondary | ICD-10-CM | POA: Diagnosis not present

## 2019-07-02 DIAGNOSIS — G43109 Migraine with aura, not intractable, without status migrainosus: Secondary | ICD-10-CM | POA: Diagnosis not present

## 2019-07-03 DIAGNOSIS — G43109 Migraine with aura, not intractable, without status migrainosus: Secondary | ICD-10-CM | POA: Diagnosis not present

## 2019-07-04 DIAGNOSIS — G43109 Migraine with aura, not intractable, without status migrainosus: Secondary | ICD-10-CM | POA: Diagnosis not present

## 2019-07-05 DIAGNOSIS — G43109 Migraine with aura, not intractable, without status migrainosus: Secondary | ICD-10-CM | POA: Diagnosis not present

## 2019-07-06 DIAGNOSIS — G43109 Migraine with aura, not intractable, without status migrainosus: Secondary | ICD-10-CM | POA: Diagnosis not present

## 2019-07-07 DIAGNOSIS — G43109 Migraine with aura, not intractable, without status migrainosus: Secondary | ICD-10-CM | POA: Diagnosis not present

## 2019-07-08 DIAGNOSIS — G43109 Migraine with aura, not intractable, without status migrainosus: Secondary | ICD-10-CM | POA: Diagnosis not present

## 2019-07-09 DIAGNOSIS — G43109 Migraine with aura, not intractable, without status migrainosus: Secondary | ICD-10-CM | POA: Diagnosis not present

## 2019-07-10 DIAGNOSIS — G43109 Migraine with aura, not intractable, without status migrainosus: Secondary | ICD-10-CM | POA: Diagnosis not present

## 2019-07-11 DIAGNOSIS — G43109 Migraine with aura, not intractable, without status migrainosus: Secondary | ICD-10-CM | POA: Diagnosis not present

## 2019-07-12 DIAGNOSIS — G43109 Migraine with aura, not intractable, without status migrainosus: Secondary | ICD-10-CM | POA: Diagnosis not present

## 2019-07-13 DIAGNOSIS — G43109 Migraine with aura, not intractable, without status migrainosus: Secondary | ICD-10-CM | POA: Diagnosis not present

## 2019-07-14 DIAGNOSIS — G43109 Migraine with aura, not intractable, without status migrainosus: Secondary | ICD-10-CM | POA: Diagnosis not present

## 2019-07-15 DIAGNOSIS — G43109 Migraine with aura, not intractable, without status migrainosus: Secondary | ICD-10-CM | POA: Diagnosis not present

## 2019-07-16 DIAGNOSIS — G43109 Migraine with aura, not intractable, without status migrainosus: Secondary | ICD-10-CM | POA: Diagnosis not present

## 2019-07-17 DIAGNOSIS — G43109 Migraine with aura, not intractable, without status migrainosus: Secondary | ICD-10-CM | POA: Diagnosis not present

## 2019-07-18 DIAGNOSIS — G43109 Migraine with aura, not intractable, without status migrainosus: Secondary | ICD-10-CM | POA: Diagnosis not present

## 2019-07-19 DIAGNOSIS — G43109 Migraine with aura, not intractable, without status migrainosus: Secondary | ICD-10-CM | POA: Diagnosis not present

## 2019-07-20 DIAGNOSIS — G43109 Migraine with aura, not intractable, without status migrainosus: Secondary | ICD-10-CM | POA: Diagnosis not present

## 2019-07-21 DIAGNOSIS — G43109 Migraine with aura, not intractable, without status migrainosus: Secondary | ICD-10-CM | POA: Diagnosis not present

## 2019-07-22 DIAGNOSIS — G43109 Migraine with aura, not intractable, without status migrainosus: Secondary | ICD-10-CM | POA: Diagnosis not present

## 2019-07-23 DIAGNOSIS — G43109 Migraine with aura, not intractable, without status migrainosus: Secondary | ICD-10-CM | POA: Diagnosis not present

## 2019-07-24 DIAGNOSIS — G43109 Migraine with aura, not intractable, without status migrainosus: Secondary | ICD-10-CM | POA: Diagnosis not present

## 2019-07-25 DIAGNOSIS — G43109 Migraine with aura, not intractable, without status migrainosus: Secondary | ICD-10-CM | POA: Diagnosis not present

## 2019-07-26 ENCOUNTER — Other Ambulatory Visit: Payer: Self-pay | Admitting: Internal Medicine

## 2019-07-26 DIAGNOSIS — G43109 Migraine with aura, not intractable, without status migrainosus: Secondary | ICD-10-CM | POA: Diagnosis not present

## 2019-07-27 DIAGNOSIS — G43109 Migraine with aura, not intractable, without status migrainosus: Secondary | ICD-10-CM | POA: Diagnosis not present

## 2019-07-28 DIAGNOSIS — G43109 Migraine with aura, not intractable, without status migrainosus: Secondary | ICD-10-CM | POA: Diagnosis not present

## 2019-07-29 DIAGNOSIS — G43109 Migraine with aura, not intractable, without status migrainosus: Secondary | ICD-10-CM | POA: Diagnosis not present

## 2019-07-30 DIAGNOSIS — G43109 Migraine with aura, not intractable, without status migrainosus: Secondary | ICD-10-CM | POA: Diagnosis not present

## 2019-07-31 DIAGNOSIS — G43109 Migraine with aura, not intractable, without status migrainosus: Secondary | ICD-10-CM | POA: Diagnosis not present

## 2019-08-01 DIAGNOSIS — G43109 Migraine with aura, not intractable, without status migrainosus: Secondary | ICD-10-CM | POA: Diagnosis not present

## 2019-08-02 DIAGNOSIS — G43109 Migraine with aura, not intractable, without status migrainosus: Secondary | ICD-10-CM | POA: Diagnosis not present

## 2019-08-03 DIAGNOSIS — G43109 Migraine with aura, not intractable, without status migrainosus: Secondary | ICD-10-CM | POA: Diagnosis not present

## 2019-08-04 ENCOUNTER — Other Ambulatory Visit: Payer: Self-pay | Admitting: Internal Medicine

## 2019-08-04 DIAGNOSIS — E1169 Type 2 diabetes mellitus with other specified complication: Secondary | ICD-10-CM

## 2019-08-04 DIAGNOSIS — G43109 Migraine with aura, not intractable, without status migrainosus: Secondary | ICD-10-CM | POA: Diagnosis not present

## 2019-08-04 NOTE — Telephone Encounter (Signed)
Requested Prescriptions  Pending Prescriptions Disp Refills  . atorvastatin (LIPITOR) 10 MG tablet [Pharmacy Med Name: atorvastatin 10 mg tablet] 90 tablet 1    Sig: TAKE ONE TABLET BY MOUTH DAILY     Cardiovascular:  Antilipid - Statins Failed - 08/04/2019 11:26 AM      Failed - LDL in normal range and within 360 days    LDL Chol Calc (NIH)  Date Value Ref Range Status  11/13/2018 74 0 - 99 mg/dL Final         Failed - Triglycerides in normal range and within 360 days    Triglycerides  Date Value Ref Range Status  11/13/2018 204 (H) 0 - 149 mg/dL Final         Passed - Total Cholesterol in normal range and within 360 days    Cholesterol, Total  Date Value Ref Range Status  11/13/2018 165 100 - 199 mg/dL Final         Passed - HDL in normal range and within 360 days    HDL  Date Value Ref Range Status  11/13/2018 57 >39 mg/dL Final         Passed - Patient is not pregnant      Passed - Valid encounter within last 12 months    Recent Outpatient Visits          4 months ago [place], [place], NP   7 months ago Primary osteoarthritis of both knees   [place], Neoma Laming B, MD   7 months ago Contusion of multiple sites of left shoulder and upper arm, initial encounter   [place], [place], [place]   8 months ago Need for influenza vaccination   [place], Jarome Matin, RPH-CPP   8 months ago Uncontrolled type 2 diabetes mellitus with peripheral neuropathy [facility])   [facility] Ladell Pier, MD

## 2019-08-05 DIAGNOSIS — G43109 Migraine with aura, not intractable, without status migrainosus: Secondary | ICD-10-CM | POA: Diagnosis not present

## 2019-08-06 DIAGNOSIS — G43109 Migraine with aura, not intractable, without status migrainosus: Secondary | ICD-10-CM | POA: Diagnosis not present

## 2019-08-07 DIAGNOSIS — G43109 Migraine with aura, not intractable, without status migrainosus: Secondary | ICD-10-CM | POA: Diagnosis not present

## 2019-08-08 DIAGNOSIS — G43109 Migraine with aura, not intractable, without status migrainosus: Secondary | ICD-10-CM | POA: Diagnosis not present

## 2019-08-09 DIAGNOSIS — G43109 Migraine with aura, not intractable, without status migrainosus: Secondary | ICD-10-CM | POA: Diagnosis not present

## 2019-08-10 DIAGNOSIS — G43109 Migraine with aura, not intractable, without status migrainosus: Secondary | ICD-10-CM | POA: Diagnosis not present

## 2019-08-11 DIAGNOSIS — G43109 Migraine with aura, not intractable, without status migrainosus: Secondary | ICD-10-CM | POA: Diagnosis not present

## 2019-08-12 DIAGNOSIS — G43109 Migraine with aura, not intractable, without status migrainosus: Secondary | ICD-10-CM | POA: Diagnosis not present

## 2019-08-13 DIAGNOSIS — G43109 Migraine with aura, not intractable, without status migrainosus: Secondary | ICD-10-CM | POA: Diagnosis not present

## 2019-08-14 DIAGNOSIS — G43109 Migraine with aura, not intractable, without status migrainosus: Secondary | ICD-10-CM | POA: Diagnosis not present

## 2019-08-15 DIAGNOSIS — G43109 Migraine with aura, not intractable, without status migrainosus: Secondary | ICD-10-CM | POA: Diagnosis not present

## 2019-08-16 DIAGNOSIS — G43109 Migraine with aura, not intractable, without status migrainosus: Secondary | ICD-10-CM | POA: Diagnosis not present

## 2019-08-17 DIAGNOSIS — G43109 Migraine with aura, not intractable, without status migrainosus: Secondary | ICD-10-CM | POA: Diagnosis not present

## 2019-08-18 DIAGNOSIS — G43109 Migraine with aura, not intractable, without status migrainosus: Secondary | ICD-10-CM | POA: Diagnosis not present

## 2019-08-19 DIAGNOSIS — G43109 Migraine with aura, not intractable, without status migrainosus: Secondary | ICD-10-CM | POA: Diagnosis not present

## 2019-08-20 DIAGNOSIS — G43109 Migraine with aura, not intractable, without status migrainosus: Secondary | ICD-10-CM | POA: Diagnosis not present

## 2019-08-21 DIAGNOSIS — G43109 Migraine with aura, not intractable, without status migrainosus: Secondary | ICD-10-CM | POA: Diagnosis not present

## 2019-08-22 DIAGNOSIS — G43109 Migraine with aura, not intractable, without status migrainosus: Secondary | ICD-10-CM | POA: Diagnosis not present

## 2019-08-23 DIAGNOSIS — G43109 Migraine with aura, not intractable, without status migrainosus: Secondary | ICD-10-CM | POA: Diagnosis not present

## 2019-08-24 DIAGNOSIS — G43109 Migraine with aura, not intractable, without status migrainosus: Secondary | ICD-10-CM | POA: Diagnosis not present

## 2019-08-25 DIAGNOSIS — G43109 Migraine with aura, not intractable, without status migrainosus: Secondary | ICD-10-CM | POA: Diagnosis not present

## 2019-08-26 DIAGNOSIS — G43109 Migraine with aura, not intractable, without status migrainosus: Secondary | ICD-10-CM | POA: Diagnosis not present

## 2019-08-27 DIAGNOSIS — G43109 Migraine with aura, not intractable, without status migrainosus: Secondary | ICD-10-CM | POA: Diagnosis not present

## 2019-08-28 DIAGNOSIS — G43109 Migraine with aura, not intractable, without status migrainosus: Secondary | ICD-10-CM | POA: Diagnosis not present

## 2019-08-29 DIAGNOSIS — G43109 Migraine with aura, not intractable, without status migrainosus: Secondary | ICD-10-CM | POA: Diagnosis not present

## 2019-08-30 DIAGNOSIS — G43109 Migraine with aura, not intractable, without status migrainosus: Secondary | ICD-10-CM | POA: Diagnosis not present

## 2019-08-31 DIAGNOSIS — G43109 Migraine with aura, not intractable, without status migrainosus: Secondary | ICD-10-CM | POA: Diagnosis not present

## 2019-09-01 DIAGNOSIS — G43109 Migraine with aura, not intractable, without status migrainosus: Secondary | ICD-10-CM | POA: Diagnosis not present

## 2019-09-02 DIAGNOSIS — G43109 Migraine with aura, not intractable, without status migrainosus: Secondary | ICD-10-CM | POA: Diagnosis not present

## 2019-09-03 DIAGNOSIS — G43109 Migraine with aura, not intractable, without status migrainosus: Secondary | ICD-10-CM | POA: Diagnosis not present

## 2019-09-04 DIAGNOSIS — G43109 Migraine with aura, not intractable, without status migrainosus: Secondary | ICD-10-CM | POA: Diagnosis not present

## 2019-09-05 DIAGNOSIS — G43109 Migraine with aura, not intractable, without status migrainosus: Secondary | ICD-10-CM | POA: Diagnosis not present

## 2019-09-06 DIAGNOSIS — G43109 Migraine with aura, not intractable, without status migrainosus: Secondary | ICD-10-CM | POA: Diagnosis not present

## 2019-09-07 DIAGNOSIS — G43109 Migraine with aura, not intractable, without status migrainosus: Secondary | ICD-10-CM | POA: Diagnosis not present

## 2019-09-08 DIAGNOSIS — G43109 Migraine with aura, not intractable, without status migrainosus: Secondary | ICD-10-CM | POA: Diagnosis not present

## 2019-09-09 DIAGNOSIS — G43109 Migraine with aura, not intractable, without status migrainosus: Secondary | ICD-10-CM | POA: Diagnosis not present

## 2019-09-10 DIAGNOSIS — G43109 Migraine with aura, not intractable, without status migrainosus: Secondary | ICD-10-CM | POA: Diagnosis not present

## 2019-09-11 DIAGNOSIS — G43109 Migraine with aura, not intractable, without status migrainosus: Secondary | ICD-10-CM | POA: Diagnosis not present

## 2019-09-12 DIAGNOSIS — G43109 Migraine with aura, not intractable, without status migrainosus: Secondary | ICD-10-CM | POA: Diagnosis not present

## 2019-09-13 DIAGNOSIS — G43109 Migraine with aura, not intractable, without status migrainosus: Secondary | ICD-10-CM | POA: Diagnosis not present

## 2019-09-14 DIAGNOSIS — G43109 Migraine with aura, not intractable, without status migrainosus: Secondary | ICD-10-CM | POA: Diagnosis not present

## 2019-09-15 DIAGNOSIS — G43109 Migraine with aura, not intractable, without status migrainosus: Secondary | ICD-10-CM | POA: Diagnosis not present

## 2019-09-16 DIAGNOSIS — G43109 Migraine with aura, not intractable, without status migrainosus: Secondary | ICD-10-CM | POA: Diagnosis not present

## 2019-09-17 DIAGNOSIS — G43109 Migraine with aura, not intractable, without status migrainosus: Secondary | ICD-10-CM | POA: Diagnosis not present

## 2019-09-18 ENCOUNTER — Other Ambulatory Visit: Payer: Self-pay | Admitting: Internal Medicine

## 2019-09-18 DIAGNOSIS — IMO0002 Reserved for concepts with insufficient information to code with codable children: Secondary | ICD-10-CM

## 2019-09-18 DIAGNOSIS — G43109 Migraine with aura, not intractable, without status migrainosus: Secondary | ICD-10-CM | POA: Diagnosis not present

## 2019-09-18 DIAGNOSIS — E1142 Type 2 diabetes mellitus with diabetic polyneuropathy: Secondary | ICD-10-CM

## 2019-09-18 DIAGNOSIS — E1165 Type 2 diabetes mellitus with hyperglycemia: Secondary | ICD-10-CM

## 2019-09-19 DIAGNOSIS — G43109 Migraine with aura, not intractable, without status migrainosus: Secondary | ICD-10-CM | POA: Diagnosis not present

## 2019-09-20 ENCOUNTER — Other Ambulatory Visit: Payer: Self-pay | Admitting: Internal Medicine

## 2019-09-20 DIAGNOSIS — G43109 Migraine with aura, not intractable, without status migrainosus: Secondary | ICD-10-CM | POA: Diagnosis not present

## 2019-09-20 DIAGNOSIS — IMO0002 Reserved for concepts with insufficient information to code with codable children: Secondary | ICD-10-CM

## 2019-09-20 DIAGNOSIS — E1142 Type 2 diabetes mellitus with diabetic polyneuropathy: Secondary | ICD-10-CM

## 2019-09-21 DIAGNOSIS — G43109 Migraine with aura, not intractable, without status migrainosus: Secondary | ICD-10-CM | POA: Diagnosis not present

## 2019-09-22 DIAGNOSIS — G43109 Migraine with aura, not intractable, without status migrainosus: Secondary | ICD-10-CM | POA: Diagnosis not present

## 2019-09-23 DIAGNOSIS — G43109 Migraine with aura, not intractable, without status migrainosus: Secondary | ICD-10-CM | POA: Diagnosis not present

## 2019-09-24 DIAGNOSIS — G43109 Migraine with aura, not intractable, without status migrainosus: Secondary | ICD-10-CM | POA: Diagnosis not present

## 2019-09-25 DIAGNOSIS — G43109 Migraine with aura, not intractable, without status migrainosus: Secondary | ICD-10-CM | POA: Diagnosis not present

## 2019-09-26 DIAGNOSIS — G43109 Migraine with aura, not intractable, without status migrainosus: Secondary | ICD-10-CM | POA: Diagnosis not present

## 2019-09-27 DIAGNOSIS — G43109 Migraine with aura, not intractable, without status migrainosus: Secondary | ICD-10-CM | POA: Diagnosis not present

## 2019-09-28 DIAGNOSIS — G43109 Migraine with aura, not intractable, without status migrainosus: Secondary | ICD-10-CM | POA: Diagnosis not present

## 2019-09-29 DIAGNOSIS — G43109 Migraine with aura, not intractable, without status migrainosus: Secondary | ICD-10-CM | POA: Diagnosis not present

## 2019-09-30 DIAGNOSIS — G43109 Migraine with aura, not intractable, without status migrainosus: Secondary | ICD-10-CM | POA: Diagnosis not present

## 2019-10-01 DIAGNOSIS — G43109 Migraine with aura, not intractable, without status migrainosus: Secondary | ICD-10-CM | POA: Diagnosis not present

## 2019-10-02 DIAGNOSIS — G43109 Migraine with aura, not intractable, without status migrainosus: Secondary | ICD-10-CM | POA: Diagnosis not present

## 2019-10-03 ENCOUNTER — Encounter: Payer: Self-pay | Admitting: Physician Assistant

## 2019-10-03 ENCOUNTER — Other Ambulatory Visit: Payer: Self-pay

## 2019-10-03 ENCOUNTER — Ambulatory Visit: Payer: Medicaid Other | Attending: Physician Assistant | Admitting: Physician Assistant

## 2019-10-03 VITALS — BP 122/76 | HR 73 | Temp 98.3°F | Resp 16 | Wt 151.2 lb

## 2019-10-03 DIAGNOSIS — E1165 Type 2 diabetes mellitus with hyperglycemia: Secondary | ICD-10-CM | POA: Diagnosis not present

## 2019-10-03 DIAGNOSIS — L301 Dyshidrosis [pompholyx]: Secondary | ICD-10-CM | POA: Diagnosis not present

## 2019-10-03 DIAGNOSIS — IMO0002 Reserved for concepts with insufficient information to code with codable children: Secondary | ICD-10-CM

## 2019-10-03 DIAGNOSIS — G43109 Migraine with aura, not intractable, without status migrainosus: Secondary | ICD-10-CM | POA: Diagnosis not present

## 2019-10-03 DIAGNOSIS — E1142 Type 2 diabetes mellitus with diabetic polyneuropathy: Secondary | ICD-10-CM | POA: Diagnosis not present

## 2019-10-03 LAB — POCT GLYCOSYLATED HEMOGLOBIN (HGB A1C): HbA1c, POC (controlled diabetic range): 7.4 % — AB (ref 0.0–7.0)

## 2019-10-03 LAB — GLUCOSE, POCT (MANUAL RESULT ENTRY): POC Glucose: 135 mg/dl — AB (ref 70–99)

## 2019-10-03 MED ORDER — TRIAMCINOLONE ACETONIDE 0.1 % EX CREA: 1.0000 "application " | TOPICAL_CREAM | Freq: Two times a day (BID) | CUTANEOUS | 4 refills | Status: DC

## 2019-10-03 NOTE — Patient Instructions (Signed)
Dyshidrotic Eczema Dyshidrotic eczema (pompholyx) is a type of eczema that causes very itchy (pruritic), fluid-filled blisters (vesicles) to form on the hands and feet. It can affect people of any age, but is more common before the age of 40. There is no cure, but treatment and certain lifestyle changes can help relieve symptoms. What are the causes? The cause of this condition is not known. What increases the risk? You are more likely to develop this condition if:  You wash your hands frequently.  You have a personal history or family history of eczema, allergies, asthma, or hay fever.  You are allergic to metals such as nickel or cobalt.  You work with cement.  You smoke. What are the signs or symptoms? Symptoms of this condition may affect the hands, feet, or both. Symptoms may come and go (recur), and may include:  Severe itching, which may happen before blisters appear.  Blisters. These may form suddenly. ? In the early stages, blisters may form near the fingertips. ? In severe cases, blisters may grow to large blister masses (bullae). ? Blisters resolve in 2-3 weeks without bursting. This is followed by a dry phase in which itching eases.  Pain and swelling.  Cracks or long, narrow openings (fissures) in the skin.  Severe dryness.  Ridges on the nails. How is this diagnosed? This condition may be diagnosed based on:  A physical exam.  Your symptoms.  Your medical history.  Skin scrapings to rule out a fungal infection.  Testing a swab of fluid for bacteria (culture).  Removing and checking a small piece of skin (biopsy) in order to test for infection or to rule out other conditions.  Skin patch tests. These tests involve taking patches that contain possible allergens and placing them on your back. Your health care provider will wait a few days and then check to see if an allergic reaction occurred. These tests may be done if your health care provider suspects  allergic reactions, or to rule out other types of eczema. You may be referred to a health care provider who specializes in the skin (dermatologist) to help diagnose and treat this condition. How is this treated? There is no cure for this condition, but treatment can help relieve symptoms. Depending on how many blisters you have and how severe they are, your health care provider may suggest:  Avoiding allergens, irritants, or triggers that worsen symptoms. This may involve lifestyle changes such as: ? Using different lotions or soaps. ? Avoiding hot weather or places that will cause you to sweat a lot. ? Managing stress with coping techniques such as relaxation and exercise, and asking for help when you need it. ? Diet changes as recommended by your health care provider.  Using a clean, damp towel (cool compress) to relieve symptoms.  Soaking in a bath that contains a type of salt that relieves irritation (aluminum acetate soaks).  Medicine taken by mouth to reduce itching (oral antihistamines).  Medicine applied to the skin to reduce swelling and irritation (topical corticosteroids).  Medicine that reduces the activity of the body's disease-fighting system (immunosuppressants) to treat inflammation. This may be given in severe cases.  Antibiotic medicines to treat bacterial infection.  Light therapy (phototherapy). This involves shining ultraviolet (UV) light on affected skin in order to reduce itchiness and inflammation. Follow these instructions at home: Bathing and skin care   Wash skin gently. After bathing or washing your hands, pat your skin dry. Avoid rubbing your skin.  Remove   all jewelry before bathing. If the skin under the jewelry stays wet, blisters may form or get worse.  Apply cool compresses as told by your health care provider: ? Soak a clean towel in cool water. ? Wring out excess water until towel is damp. ? Place the towel over affected skin. Leave the towel on  for 20 minutes at a time, 2-3 times a day.  Use mild soaps, cleansers, and lotions that do not contain dyes, perfumes, or other irritants.  Keep your skin hydrated. To do this: ? Avoid very hot water. Take lukewarm baths or showers. ? Apply moisturizer within three minutes of bathing. This locks in moisture. Medicines  Take and apply over-the-counter and prescription medicines only as told by your health care provider.  If you were prescribed antibiotic medicine, take or apply it as told by your health care provider. Do not stop using the antibiotic even if you start to feel better. General instructions  Identify and avoid triggers and allergens.  Keep fingernails short to avoid breaking open the skin while scratching.  Use waterproof gloves to protect your hands when doing work that keeps your hands wet for a long time.  Wear socks to keep your feet dry.  Do not use any products that contain nicotine or tobacco, such as cigarettes and e-cigarettes. If you need help quitting, ask your health care provider.  Keep all follow-up visits as told by your health care provider. This is important. Contact a health care provider if:  You have symptoms that do not go away.  You have signs of infection, such as: ? Crusting, pus, or a bad smell. ? More redness, swelling, or pain. ? Increased warmth in the affected area. Summary  Dyshidrotic eczema (pompholyx) is a type of eczema that causes very itchy (pruritic), fluid-filled blisters (vesicles) to form on the hands and feet.  The cause of this condition is not known.  There is no cure for this condition, but treatment can help relieve symptoms. Treatment depends on how many blisters you have and how severe they are.  Use mild soaps, cleansers, and lotions that do not contain dyes, perfumes, or other irritants. Keep your skin hydrated. This information is not intended to replace advice given to you by your health care provider. Make sure  you discuss any questions you have with your health care provider. Document Revised: 05/10/2018 Document Reviewed: 06/03/2016 Elsevier Patient Education  2020 Elsevier Inc.  

## 2019-10-03 NOTE — Progress Notes (Signed)
Patient ID: April Mcmahon, female   DOB: 1960-12-05, 59 y.o.   MRN: [id number]   April Mcmahon, is a 59 y.o. female  [id number]  [id number]  DOB - 04/30/1960  Subjective:  Chief Complaint and HPI: April Mcmahon is a 59 y.o. female here today c/o dry skin and "cuts" on her fingers and heels of her feet.  No one else in home affected.  This has been ongoing.  OTC creams not helping.  Denies repetitive cutting or injuries.  Not  Staying in water a lot.   Her daughter is translating for her.    ROS:   Constitutional:  No f/c, No night sweats, No unexplained weight loss. EENT:  No vision changes, No blurry vision, No hearing changes. No mouth, throat, or ear problems.  Respiratory: No cough, No SOB Cardiac: No CP, no palpitations GI:  No abd pain, No N/V/D. GU: No Urinary s/sx Musculoskeletal: No joint pain Neuro: No headache, no dizziness, no motor weakness.  Skin: No rash Endocrine:  No polydipsia. No polyuria.  Psych: Denies SI/HI  No problems updated.  ALLERGIES: Allergies  Allergen Reactions  . Elavil [Amitriptyline Hcl] Diarrhea and Nausea And Vomiting    PAST MEDICAL HISTORY: Past Medical History:  Diagnosis Date  . Bilateral chronic knee pain 06/2014  . Chronic low back pain 06/2014  . Chronic migraine 02/1985  . Depression    hx of  . Diabetes mellitus without complication ([place]) 89/3810  . Fall   . Hyperlipidemia   . Hypertension 09/2013  . Shoulder pain     MEDICATIONS AT HOME: Prior to Admission medications   Medication Sig Start Date End Date Taking? Authorizing Provider  ACCU-CHEK AVIVA PLUS test strip AS DIRECTED 04/03/19   Ladell Pier, MD  Accu-Chek Softclix Lancets lancets AS DIRECTED 04/03/19   Ladell Pier, MD  amLODipine (NORVASC) 10 MG tablet Take 1 tablet (10 mg total) by mouth daily. 11/13/18   Ladell Pier, MD  atorvastatin (LIPITOR) 10 MG tablet TAKE ONE TABLET BY MOUTH DAILY 08/04/19   Ladell Pier, MD  cephALEXin  (KEFLEX) 500 MG capsule Take 1 capsule (500 mg total) by mouth 4 (four) times daily. Patient not taking: Reported on 10/03/2019 04/18/19   Loura Halt A, NP  gabapentin (NEURONTIN) 300 MG capsule Take 1 capsule (300 mg total) by mouth 2 (two) times daily. 12/12/18   Ladell Pier, MD  glimepiride (AMARYL) 2 MG tablet TAKE ONE TABLET BY MOUTH DAILY BEFORE BREAKFAST 09/18/19   Ladell Pier, MD  ibuprofen (ADVIL) 600 MG tablet Take 1 tablet (600 mg total) by mouth every 6 (six) hours as needed. 04/18/19   Orvan July, NP  JANUVIA 50 MG tablet TAKE ONE TABLET BY MOUTH DAILY 09/18/19   Ladell Pier, MD  JARDIANCE 10 MG TABS tablet TAKE ONE TABLET BY MOUTH EVERY DAY 01/10/19   Ladell Pier, MD  lisinopril (ZESTRIL) 2.5 MG tablet TAKE TWO TABLETS BY MOUTH DAILY 09/20/19   Ladell Pier, MD  metFORMIN (GLUCOPHAGE) 1000 MG tablet TAKE ONE TABLET BY MOUTH WITH FOOD TWICE DAILY 09/18/19   Ladell Pier, MD  methocarbamol (ROBAXIN) 500 MG tablet Take 1 tablet (500 mg total) by mouth every 8 (eight) hours as needed for muscle spasms. 12/14/18   Chase Picket, MD  naproxen (NAPROSYN) 500 MG tablet Take 1 tablet (500 mg total) by mouth 2 (two) times daily with a meal. 03/15/19   Camillia Herter, NP  NICORETTE STARTER KIT 2 MG gum Take 1 each (2 mg total) by mouth as needed for smoking cessation. 05/30/19   Johnson, Deborah B, MD  nicotine (NICODERM CQ - DOSED IN MG/24 HOURS) 21 mg/24hr patch unwrap AND APPLY ONE PATCH TO THE SKIN DAILY 03/15/19   Stephens, Amy J, NP  triamcinolone cream (KENALOG) 0.1 % Apply 1 application topically 2 (two) times daily. 10/03/19   ,  M, PA-C  VOLTAREN 1 % GEL APPLY TWO GRAMS EXTERNALLY TO AFFECTED AREA FOUR TIMES DAILY 08/17/18   Johnson, Deborah B, MD  fluticasone (FLONASE) 50 MCG/ACT nasal spray Place 1 spray into both nostrils daily. 10/13/16 12/14/18  ,  M, PA-C  SUMAtriptan (IMITREX) 100 MG tablet Take 1 tablet (100 mg total) by  mouth as needed for migraine. May repeat in 2 hours if headache persists or recurs.  Do not refill in less than 30 days Patient not taking: Reported on 11/29/2018 02/21/18 12/14/18  Johnson, Deborah B, MD  venlafaxine (EFFEXOR) 37.5 MG tablet Take 1 tablet (37.5 mg total) by mouth 2 (two) times daily with a meal. 02/09/17 12/14/18  Yan, Yijun, MD     Objective:  EXAM:   Vitals:   10/03/19 0849  BP: 122/76  Pulse: 73  Resp: 16  Temp: 98.3 F (36.8 C)  SpO2: 98%  Weight: 151 lb 3.2 oz (68.6 kg)    General appearance : A&OX3. NAD. Non-toxic-appearing HEENT: Atraumatic and Normocephalic.  PERRLA. EOM intact.  TChest/Lungs:  Breathing-non-labored, Good air entry bilaterally, breath sounds normal without rales, rhonchi, or wheezing  CVS: S1 S2 regular, no murmurs, gallops, rubs  Fingers and heels of feet with severe dry skin.  No secondary infection.   Extremities: Bilateral Lower Ext shows no edema, both legs are warm to touch with = pulse throughout Neurology:  CN II-XII grossly intact, Non focal.   Psych:  TP linear. J/I WNL. Normal speech. Appropriate eye contact and affect.  Skin:  No Rash  Data Review Lab Results  Component Value Date   HGBA1C 7.4 (A) 10/03/2019   HGBA1C 8.1 (A) 11/13/2018   HGBA1C 7.8 (A) 08/11/2018     Assessment & Plan   1. Uncontrolled type 2 diabetes mellitus with peripheral neuropathy (HCC) Some improvement-Continue current regimen - Glucose (CBG) - HgB A1c - TSH - Comprehensive metabolic panel  2. Dyshidrotic eczema - triamcinolone cream (KENALOG) 0.1 %; Apply 1 application topically 2 (two) times daily.  Dispense: 30 g; Refill: 4 - TSH     Patient have been counseled extensively about nutrition and exercise  Return in about 3 months (around 01/02/2020) for PCP.  The patient was given clear instructions to go to ER or return to medical center if symptoms don't improve, worsen or new problems develop. The patient verbalized understanding.  The patient was told to call to get lab results if they haven't heard anything in the next week.      , PA-C [facility] [place],  [phone]   10/03/2019, 9:05 AM 

## 2019-10-04 ENCOUNTER — Other Ambulatory Visit: Payer: Self-pay | Admitting: Physician Assistant

## 2019-10-04 DIAGNOSIS — G43109 Migraine with aura, not intractable, without status migrainosus: Secondary | ICD-10-CM | POA: Diagnosis not present

## 2019-10-04 LAB — COMPREHENSIVE METABOLIC PANEL
ALT: 57 IU/L — ABNORMAL HIGH (ref 0–32)
AST: 47 IU/L — ABNORMAL HIGH (ref 0–40)
Albumin/Globulin Ratio: 1.8 (ref 1.2–2.2)
Albumin: 4.9 g/dL (ref 3.8–4.9)
Alkaline Phosphatase: 135 IU/L — ABNORMAL HIGH (ref 48–121)
BUN/Creatinine Ratio: 11 (ref 9–23)
BUN: 6 mg/dL (ref 6–24)
Bilirubin Total: 0.3 mg/dL (ref 0.0–1.2)
CO2: 22 mmol/L (ref 20–29)
Calcium: 9.3 mg/dL (ref 8.7–10.2)
Chloride: 105 mmol/L (ref 96–106)
Creatinine, Ser: 0.57 mg/dL (ref 0.57–1.00)
GFR calc Af Amer: 119 mL/min/{1.73_m2} (ref >59)
GFR calc non Af Amer: 103 mL/min/{1.73_m2} (ref >59)
Globulin, Total: 2.8 g/dL (ref 1.5–4.5)
Glucose: 123 mg/dL — ABNORMAL HIGH (ref 65–99)
Potassium: 4.3 mmol/L (ref 3.5–5.2)
Sodium: 141 mmol/L (ref 134–144)
Total Protein: 7.7 g/dL (ref 6.0–8.5)

## 2019-10-04 LAB — TSH: TSH: 87.3 u[IU]/mL — ABNORMAL HIGH (ref 0.450–4.500)

## 2019-10-04 MED ORDER — LEVOTHYROXINE SODIUM 50 MCG PO TABS: 50.0000 ug | ORAL_TABLET | Freq: Every day | ORAL | 3 refills | Status: DC

## 2019-10-05 DIAGNOSIS — G43109 Migraine with aura, not intractable, without status migrainosus: Secondary | ICD-10-CM | POA: Diagnosis not present

## 2019-10-06 DIAGNOSIS — G43109 Migraine with aura, not intractable, without status migrainosus: Secondary | ICD-10-CM | POA: Diagnosis not present

## 2019-10-07 DIAGNOSIS — G43109 Migraine with aura, not intractable, without status migrainosus: Secondary | ICD-10-CM | POA: Diagnosis not present

## 2019-10-08 DIAGNOSIS — G43109 Migraine with aura, not intractable, without status migrainosus: Secondary | ICD-10-CM | POA: Diagnosis not present

## 2019-10-09 DIAGNOSIS — G43109 Migraine with aura, not intractable, without status migrainosus: Secondary | ICD-10-CM | POA: Diagnosis not present

## 2019-10-10 DIAGNOSIS — G43109 Migraine with aura, not intractable, without status migrainosus: Secondary | ICD-10-CM | POA: Diagnosis not present

## 2019-10-11 DIAGNOSIS — G43109 Migraine with aura, not intractable, without status migrainosus: Secondary | ICD-10-CM | POA: Diagnosis not present

## 2019-10-12 DIAGNOSIS — G43109 Migraine with aura, not intractable, without status migrainosus: Secondary | ICD-10-CM | POA: Diagnosis not present

## 2019-10-13 DIAGNOSIS — G43109 Migraine with aura, not intractable, without status migrainosus: Secondary | ICD-10-CM | POA: Diagnosis not present

## 2019-10-14 DIAGNOSIS — G43109 Migraine with aura, not intractable, without status migrainosus: Secondary | ICD-10-CM | POA: Diagnosis not present

## 2019-10-15 ENCOUNTER — Other Ambulatory Visit: Payer: Self-pay | Admitting: Internal Medicine

## 2019-10-15 DIAGNOSIS — F17200 Nicotine dependence, unspecified, uncomplicated: Secondary | ICD-10-CM

## 2019-10-15 DIAGNOSIS — IMO0002 Reserved for concepts with insufficient information to code with codable children: Secondary | ICD-10-CM

## 2019-10-15 DIAGNOSIS — G43109 Migraine with aura, not intractable, without status migrainosus: Secondary | ICD-10-CM | POA: Diagnosis not present

## 2019-10-15 NOTE — Telephone Encounter (Signed)
Requested Prescriptions  Pending Prescriptions Disp Refills  . metFORMIN (GLUCOPHAGE) 1000 MG tablet [Pharmacy Med Name: metformin 1,000 mg tablet] 180 tablet 1    Sig: TAKE ONE TABLET BY MOUTH TWICE DAILY     Endocrinology:  Diabetes - Biguanides Passed - 10/15/2019  9:15 AM      Passed - Cr in normal range and within 360 days    Creat  Date Value Ref Range Status  10/20/2015 0.62 0.50 - 1.05 mg/dL Final    Comment:      For patients > or = 57 years of age: The upper reference limit for Creatinine is approximately 13% higher for people identified as African-American.      Creatinine, Ser  Date Value Ref Range Status  10/03/2019 0.57 0.57 - 1.00 mg/dL Final   Creatinine, Urine  Date Value Ref Range Status  01/20/2016 63 20 - 320 mg/dL Final         Passed - HBA1C is between 0 and 7.9 and within 180 days    HbA1c, POC (controlled diabetic range)  Date Value Ref Range Status  10/03/2019 7.4 (A) 0.0 - 7.0 % Final         Passed - eGFR in normal range and within 360 days    GFR, Est African American  Date Value Ref Range Status  09/03/2014 >89 >=60 mL/min Final   GFR calc Af Amer  Date Value Ref Range Status  10/03/2019 119 >59 mL/min/1.73 Final    Comment:    **[facility] currently reports eGFR in compliance with the current**   recommendations of the [employer]. [facility] will   update reporting as new guidelines are published from the NKF-ASN   Task force.    GFR, Est Non African American  Date Value Ref Range Status  09/03/2014 >89 >=60 mL/min Final    Comment:      The estimated GFR is a calculation valid for adults (>=2 years old) that uses the CKD-EPI algorithm to adjust for age and sex. It is   not to be used for children, pregnant women, hospitalized patients,    patients on dialysis, or with rapidly changing kidney function. According to the NKDEP, eGFR >89 is normal, 60-89 shows mild impairment, 30-59 shows moderate impairment, 15-29 shows  severe impairment and <15 is ESRD.      GFR calc non Af Amer  Date Value Ref Range Status  10/03/2019 103 >59 mL/min/1.73 Final         Passed - Valid encounter within last 6 months    Recent Outpatient Visits          1 week ago Uncontrolled type 2 diabetes mellitus with peripheral neuropathy [facility])   [place], [place], [place]   7 months ago [place], [place], NP   9 months ago Primary osteoarthritis of both knees   [place], MD   10 months ago Contusion of multiple sites of left shoulder and upper arm, initial encounter   [place], [place]   11 months ago Need for influenza vaccination   [place], RPH-CPP      Future Appointments            In 2 months Ladell Pier, MD [place]           .  glimepiride (AMARYL) 2 MG tablet [Pharmacy Med Name: glimepiride 2 mg tablet] 90 tablet 1    Sig: TAKE ONE TABLET BY MOUTH BEFORE BREAKFAST     Endocrinology:  Diabetes - Sulfonylureas Passed - 10/15/2019  9:15 AM      Passed - HBA1C is between 0 and 7.9 and within 180 days    HbA1c, POC (controlled diabetic range)  Date Value Ref Range Status  10/03/2019 7.4 (A) 0.0 - 7.0 % Final         Passed - Valid encounter within last 6 months    Recent Outpatient Visits          1 week ago Uncontrolled type 2 diabetes mellitus with peripheral neuropathy [facility])   [place] [facility] [place], [place], [place]   7 months ago Cervicalgia   [place] [facility] [place], [place], NP   9 months ago Primary osteoarthritis of both knees   [facility] Jonah Blue B, MD   10 months ago Contusion of multiple sites of left shoulder and upper  arm, initial encounter   [facility] [place], Marylene Land M, [place]   11 months ago Need for influenza vaccination   [facility] Lois Huxley, Cornelius Moras, RPH-CPP      Future Appointments            In 2 months Laural Benes, Binnie Rail, MD [facility]           . gabapentin (NEURONTIN) 300 MG capsule [Pharmacy Med Name: gabapentin 300 mg capsule] 180 capsule 1    Sig: TAKE ONE CAPSULE BY MOUTH TWICE DAILY     Neurology: Anticonvulsants - gabapentin Passed - 10/15/2019  9:15 AM      Passed - Valid encounter within last 12 months    Recent Outpatient Visits          1 week ago Uncontrolled type 2 diabetes mellitus with peripheral neuropathy [facility])   [place] [facility] [place], [place], [place]   7 months ago Cervicalgia   [place] [facility] [place], [place], NP   9 months ago Primary osteoarthritis of both knees   [facility] Jonah Blue B, MD   10 months ago Contusion of multiple sites of left shoulder and upper arm, initial encounter   [facility] [place], Marylene Land [place], [place]   11 months ago Need for influenza vaccination   [facility] Lois Huxley, Cornelius Moras, RPH-CPP      Future Appointments            In 2 months Laural Benes, Binnie Rail, MD [facility]           . nicotine (NICODERM CQ - DOSED IN MG/24 HOURS) 21 mg/24hr patch [Pharmacy Med Name: nicotine 21 mg/24 hr daily transdermal patch] 28 patch 1    Sig: unwrap AND APPLY ONE PATCH TO THE SKIN DAILY     Psychiatry:  Drug Dependence Therapy Passed - 10/15/2019  9:15 AM      Passed - Valid encounter within last 12 months    Recent Outpatient Visits          1 week ago Uncontrolled type 2 diabetes mellitus with peripheral neuropathy [facility])   [facility]  [place], [place], [place]  7 months ago [place], [place], NP   9 months ago Primary osteoarthritis of both knees   [place], MD   10 months ago Contusion of multiple sites of left shoulder and upper arm, initial encounter   [place], [place]   11 months ago Need for influenza vaccination   [place], RPH-CPP      Future Appointments            In 2 months Ladell Pier, MD [place]

## 2019-10-16 DIAGNOSIS — G43109 Migraine with aura, not intractable, without status migrainosus: Secondary | ICD-10-CM | POA: Diagnosis not present

## 2019-10-17 DIAGNOSIS — G43109 Migraine with aura, not intractable, without status migrainosus: Secondary | ICD-10-CM | POA: Diagnosis not present

## 2019-10-18 DIAGNOSIS — G43109 Migraine with aura, not intractable, without status migrainosus: Secondary | ICD-10-CM | POA: Diagnosis not present

## 2019-10-19 DIAGNOSIS — G43109 Migraine with aura, not intractable, without status migrainosus: Secondary | ICD-10-CM | POA: Diagnosis not present

## 2019-10-20 DIAGNOSIS — G43109 Migraine with aura, not intractable, without status migrainosus: Secondary | ICD-10-CM | POA: Diagnosis not present

## 2019-10-21 DIAGNOSIS — G43109 Migraine with aura, not intractable, without status migrainosus: Secondary | ICD-10-CM | POA: Diagnosis not present

## 2019-10-22 DIAGNOSIS — G43109 Migraine with aura, not intractable, without status migrainosus: Secondary | ICD-10-CM | POA: Diagnosis not present

## 2019-10-23 DIAGNOSIS — G43109 Migraine with aura, not intractable, without status migrainosus: Secondary | ICD-10-CM | POA: Diagnosis not present

## 2019-10-24 DIAGNOSIS — G43109 Migraine with aura, not intractable, without status migrainosus: Secondary | ICD-10-CM | POA: Diagnosis not present

## 2019-10-25 DIAGNOSIS — G43109 Migraine with aura, not intractable, without status migrainosus: Secondary | ICD-10-CM | POA: Diagnosis not present

## 2019-10-26 DIAGNOSIS — G43109 Migraine with aura, not intractable, without status migrainosus: Secondary | ICD-10-CM | POA: Diagnosis not present

## 2019-10-27 DIAGNOSIS — G43109 Migraine with aura, not intractable, without status migrainosus: Secondary | ICD-10-CM | POA: Diagnosis not present

## 2019-10-28 DIAGNOSIS — G43109 Migraine with aura, not intractable, without status migrainosus: Secondary | ICD-10-CM | POA: Diagnosis not present

## 2019-10-29 DIAGNOSIS — G43109 Migraine with aura, not intractable, without status migrainosus: Secondary | ICD-10-CM | POA: Diagnosis not present

## 2019-10-30 ENCOUNTER — Other Ambulatory Visit: Payer: Self-pay | Admitting: Internal Medicine

## 2019-10-30 DIAGNOSIS — E785 Hyperlipidemia, unspecified: Secondary | ICD-10-CM

## 2019-10-30 DIAGNOSIS — G43109 Migraine with aura, not intractable, without status migrainosus: Secondary | ICD-10-CM | POA: Diagnosis not present

## 2019-10-31 DIAGNOSIS — G43109 Migraine with aura, not intractable, without status migrainosus: Secondary | ICD-10-CM | POA: Diagnosis not present

## 2019-11-01 DIAGNOSIS — G43109 Migraine with aura, not intractable, without status migrainosus: Secondary | ICD-10-CM | POA: Diagnosis not present

## 2019-11-02 DIAGNOSIS — G43109 Migraine with aura, not intractable, without status migrainosus: Secondary | ICD-10-CM | POA: Diagnosis not present

## 2019-11-03 DIAGNOSIS — G43109 Migraine with aura, not intractable, without status migrainosus: Secondary | ICD-10-CM | POA: Diagnosis not present

## 2019-11-04 DIAGNOSIS — G43109 Migraine with aura, not intractable, without status migrainosus: Secondary | ICD-10-CM | POA: Diagnosis not present

## 2019-11-05 DIAGNOSIS — G43109 Migraine with aura, not intractable, without status migrainosus: Secondary | ICD-10-CM | POA: Diagnosis not present

## 2019-11-06 DIAGNOSIS — G43109 Migraine with aura, not intractable, without status migrainosus: Secondary | ICD-10-CM | POA: Diagnosis not present

## 2019-11-07 DIAGNOSIS — G43109 Migraine with aura, not intractable, without status migrainosus: Secondary | ICD-10-CM | POA: Diagnosis not present

## 2019-11-08 DIAGNOSIS — G43109 Migraine with aura, not intractable, without status migrainosus: Secondary | ICD-10-CM | POA: Diagnosis not present

## 2019-11-09 DIAGNOSIS — G43109 Migraine with aura, not intractable, without status migrainosus: Secondary | ICD-10-CM | POA: Diagnosis not present

## 2019-11-10 DIAGNOSIS — G43109 Migraine with aura, not intractable, without status migrainosus: Secondary | ICD-10-CM | POA: Diagnosis not present

## 2019-11-11 DIAGNOSIS — G43109 Migraine with aura, not intractable, without status migrainosus: Secondary | ICD-10-CM | POA: Diagnosis not present

## 2019-11-12 ENCOUNTER — Other Ambulatory Visit: Payer: Self-pay | Admitting: Internal Medicine

## 2019-11-12 DIAGNOSIS — IMO0002 Reserved for concepts with insufficient information to code with codable children: Secondary | ICD-10-CM

## 2019-11-12 DIAGNOSIS — E1165 Type 2 diabetes mellitus with hyperglycemia: Secondary | ICD-10-CM

## 2019-11-12 DIAGNOSIS — G43109 Migraine with aura, not intractable, without status migrainosus: Secondary | ICD-10-CM | POA: Diagnosis not present

## 2019-11-13 DIAGNOSIS — G43109 Migraine with aura, not intractable, without status migrainosus: Secondary | ICD-10-CM | POA: Diagnosis not present

## 2019-11-14 DIAGNOSIS — G43109 Migraine with aura, not intractable, without status migrainosus: Secondary | ICD-10-CM | POA: Diagnosis not present

## 2019-11-15 DIAGNOSIS — G43109 Migraine with aura, not intractable, without status migrainosus: Secondary | ICD-10-CM | POA: Diagnosis not present

## 2019-11-16 DIAGNOSIS — G43109 Migraine with aura, not intractable, without status migrainosus: Secondary | ICD-10-CM | POA: Diagnosis not present

## 2019-11-17 DIAGNOSIS — G43109 Migraine with aura, not intractable, without status migrainosus: Secondary | ICD-10-CM | POA: Diagnosis not present

## 2019-11-18 DIAGNOSIS — G43109 Migraine with aura, not intractable, without status migrainosus: Secondary | ICD-10-CM | POA: Diagnosis not present

## 2019-11-19 ENCOUNTER — Other Ambulatory Visit: Payer: Self-pay | Admitting: Internal Medicine

## 2019-11-19 DIAGNOSIS — I10 Essential (primary) hypertension: Secondary | ICD-10-CM

## 2019-11-19 DIAGNOSIS — E1165 Type 2 diabetes mellitus with hyperglycemia: Secondary | ICD-10-CM

## 2019-11-19 DIAGNOSIS — IMO0002 Reserved for concepts with insufficient information to code with codable children: Secondary | ICD-10-CM

## 2019-11-19 DIAGNOSIS — G43109 Migraine with aura, not intractable, without status migrainosus: Secondary | ICD-10-CM | POA: Diagnosis not present

## 2019-11-20 DIAGNOSIS — G43109 Migraine with aura, not intractable, without status migrainosus: Secondary | ICD-10-CM | POA: Diagnosis not present

## 2019-11-21 DIAGNOSIS — G43109 Migraine with aura, not intractable, without status migrainosus: Secondary | ICD-10-CM | POA: Diagnosis not present

## 2019-11-22 DIAGNOSIS — G43109 Migraine with aura, not intractable, without status migrainosus: Secondary | ICD-10-CM | POA: Diagnosis not present

## 2019-11-23 DIAGNOSIS — G43109 Migraine with aura, not intractable, without status migrainosus: Secondary | ICD-10-CM | POA: Diagnosis not present

## 2019-11-24 DIAGNOSIS — G43109 Migraine with aura, not intractable, without status migrainosus: Secondary | ICD-10-CM | POA: Diagnosis not present

## 2019-11-25 DIAGNOSIS — G43109 Migraine with aura, not intractable, without status migrainosus: Secondary | ICD-10-CM | POA: Diagnosis not present

## 2019-11-26 DIAGNOSIS — G43109 Migraine with aura, not intractable, without status migrainosus: Secondary | ICD-10-CM | POA: Diagnosis not present

## 2019-11-27 DIAGNOSIS — G43109 Migraine with aura, not intractable, without status migrainosus: Secondary | ICD-10-CM | POA: Diagnosis not present

## 2019-11-28 DIAGNOSIS — G43109 Migraine with aura, not intractable, without status migrainosus: Secondary | ICD-10-CM | POA: Diagnosis not present

## 2019-11-29 DIAGNOSIS — G43109 Migraine with aura, not intractable, without status migrainosus: Secondary | ICD-10-CM | POA: Diagnosis not present

## 2019-11-30 DIAGNOSIS — G43109 Migraine with aura, not intractable, without status migrainosus: Secondary | ICD-10-CM | POA: Diagnosis not present

## 2019-12-01 DIAGNOSIS — G43109 Migraine with aura, not intractable, without status migrainosus: Secondary | ICD-10-CM | POA: Diagnosis not present

## 2019-12-02 DIAGNOSIS — G43109 Migraine with aura, not intractable, without status migrainosus: Secondary | ICD-10-CM | POA: Diagnosis not present

## 2019-12-03 DIAGNOSIS — G43109 Migraine with aura, not intractable, without status migrainosus: Secondary | ICD-10-CM | POA: Diagnosis not present

## 2019-12-04 ENCOUNTER — Ambulatory Visit: Payer: Medicaid Other | Attending: Internal Medicine

## 2019-12-04 ENCOUNTER — Other Ambulatory Visit: Payer: Self-pay

## 2019-12-04 DIAGNOSIS — L301 Dyshidrosis [pompholyx]: Secondary | ICD-10-CM

## 2019-12-04 DIAGNOSIS — G43109 Migraine with aura, not intractable, without status migrainosus: Secondary | ICD-10-CM | POA: Diagnosis not present

## 2019-12-05 DIAGNOSIS — G43109 Migraine with aura, not intractable, without status migrainosus: Secondary | ICD-10-CM | POA: Diagnosis not present

## 2019-12-05 LAB — TSH: TSH: 5.39 u[IU]/mL — ABNORMAL HIGH (ref 0.450–4.500)

## 2019-12-06 DIAGNOSIS — G43109 Migraine with aura, not intractable, without status migrainosus: Secondary | ICD-10-CM | POA: Diagnosis not present

## 2019-12-07 DIAGNOSIS — G43109 Migraine with aura, not intractable, without status migrainosus: Secondary | ICD-10-CM | POA: Diagnosis not present

## 2019-12-08 DIAGNOSIS — G43109 Migraine with aura, not intractable, without status migrainosus: Secondary | ICD-10-CM | POA: Diagnosis not present

## 2019-12-09 DIAGNOSIS — G43109 Migraine with aura, not intractable, without status migrainosus: Secondary | ICD-10-CM | POA: Diagnosis not present

## 2019-12-10 ENCOUNTER — Other Ambulatory Visit: Payer: Self-pay | Admitting: Internal Medicine

## 2019-12-10 DIAGNOSIS — G43109 Migraine with aura, not intractable, without status migrainosus: Secondary | ICD-10-CM | POA: Diagnosis not present

## 2019-12-10 DIAGNOSIS — IMO0002 Reserved for concepts with insufficient information to code with codable children: Secondary | ICD-10-CM

## 2019-12-10 DIAGNOSIS — E1165 Type 2 diabetes mellitus with hyperglycemia: Secondary | ICD-10-CM

## 2019-12-10 NOTE — Telephone Encounter (Signed)
Requested Prescriptions  Pending Prescriptions Disp Refills  . lisinopril (ZESTRIL) 2.5 MG tablet [Pharmacy Med Name: lisinopril 2.5 mg tablet] 180 tablet 0    Sig: TAKE TWO TABLETS BY MOUTH DAILY     Cardiovascular:  ACE Inhibitors Passed - 12/10/2019  9:22 AM      Passed - Cr in normal range and within 180 days    Creat  Date Value Ref Range Status  10/20/2015 0.62 0.50 - 1.05 mg/dL Final    Comment:      For patients > or = 57 years of age: The upper reference limit for Creatinine is approximately 13% higher for people identified as African-American.      Creatinine, Ser  Date Value Ref Range Status  10/03/2019 0.57 0.57 - 1.00 mg/dL Final   Creatinine, Urine  Date Value Ref Range Status  01/20/2016 63 20 - 320 mg/dL Final         Passed - K in normal range and within 180 days    Potassium  Date Value Ref Range Status  10/03/2019 4.3 3.5 - 5.2 mmol/L Final         Passed - Patient is not pregnant      Passed - Last BP in normal range    BP Readings from Last 1 Encounters:  10/03/19 122/76         Passed - Valid encounter within last 6 months    Recent Outpatient Visits          2 months ago Uncontrolled type 2 diabetes mellitus with peripheral neuropathy [facility])   [place], [place], [place]   9 months ago [place], [place], NP   11 months ago Primary osteoarthritis of both knees   [place], MD   12 months ago Contusion of multiple sites of left shoulder and upper arm, initial encounter   [place], [place], [place]   1 year ago Need for influenza vaccination   [place], RPH-CPP      Future Appointments            In 3 weeks Ladell Pier, MD [place]           . glimepiride  (AMARYL) 2 MG tablet [Pharmacy Med Name: glimepiride 2 mg tablet] 90 tablet     Sig: TAKE ONE TABLET BY [place]     Endocrinology:  Diabetes - Sulfonylureas Passed - 12/10/2019  9:22 AM      Passed - HBA1C is between 0 and 7.9 and within 180 days    HbA1c, POC (controlled diabetic range)  Date Value Ref Range Status  10/03/2019 7.4 (A) 0.0 - 7.0 % Final         Passed - Valid encounter within last 6 months    Recent Outpatient Visits          2 months ago Uncontrolled type 2 diabetes mellitus with peripheral neuropathy [facility])   [place], [place], [place]   9 months ago [place], [place], NP   11 months ago Primary osteoarthritis of both knees   [place] Ladell Pier, MD   12 months ago Contusion  of multiple sites of left shoulder and upper arm, initial encounter   [place], [place], [place]   1 year ago Need for influenza vaccination   [place], RPH-CPP      Future Appointments            In 3 weeks Ladell Pier, MD [place]           . JANUVIA 50 MG tablet [Pharmacy Med Name: Januvia 50 mg tablet] 90 tablet 0    Sig: TAKE ONE TABLET BY MOUTH EVERY DAY     Endocrinology:  Diabetes - DPP-4 Inhibitors Passed - 12/10/2019  9:22 AM      Passed - HBA1C is between 0 and 7.9 and within 180 days    HbA1c, POC (controlled diabetic range)  Date Value Ref Range Status  10/03/2019 7.4 (A) 0.0 - 7.0 % Final         Passed - Cr in normal range and within 360 days    Creat  Date Value Ref Range Status  10/20/2015 0.62 0.50 - 1.05 mg/dL Final    Comment:      For patients > or = 57 years of age: The upper reference limit for Creatinine is approximately 13% higher for people identified  as African-American.      Creatinine, Ser  Date Value Ref Range Status  10/03/2019 0.57 0.57 - 1.00 mg/dL Final   Creatinine, Urine  Date Value Ref Range Status  01/20/2016 63 20 - 320 mg/dL Final         Passed - Valid encounter within last 6 months    Recent Outpatient Visits          2 months ago Uncontrolled type 2 diabetes mellitus with peripheral neuropathy [facility])   [place], [place], [place]   9 months ago [place], [place], NP   11 months ago Primary osteoarthritis of both knees   [place], MD   12 months ago Contusion of multiple sites of left shoulder and upper arm, initial encounter   [place], [place], [place]   1 year ago Need for influenza vaccination   [place], RPH-CPP      Future Appointments            In 3 weeks Ladell Pier, MD [place]

## 2019-12-11 DIAGNOSIS — G43109 Migraine with aura, not intractable, without status migrainosus: Secondary | ICD-10-CM | POA: Diagnosis not present

## 2019-12-12 DIAGNOSIS — G43109 Migraine with aura, not intractable, without status migrainosus: Secondary | ICD-10-CM | POA: Diagnosis not present

## 2019-12-13 DIAGNOSIS — G43109 Migraine with aura, not intractable, without status migrainosus: Secondary | ICD-10-CM | POA: Diagnosis not present

## 2019-12-14 DIAGNOSIS — G43109 Migraine with aura, not intractable, without status migrainosus: Secondary | ICD-10-CM | POA: Diagnosis not present

## 2019-12-15 DIAGNOSIS — G43109 Migraine with aura, not intractable, without status migrainosus: Secondary | ICD-10-CM | POA: Diagnosis not present

## 2019-12-16 DIAGNOSIS — G43109 Migraine with aura, not intractable, without status migrainosus: Secondary | ICD-10-CM | POA: Diagnosis not present

## 2019-12-17 DIAGNOSIS — G43109 Migraine with aura, not intractable, without status migrainosus: Secondary | ICD-10-CM | POA: Diagnosis not present

## 2019-12-18 DIAGNOSIS — G43109 Migraine with aura, not intractable, without status migrainosus: Secondary | ICD-10-CM | POA: Diagnosis not present

## 2019-12-19 DIAGNOSIS — G43109 Migraine with aura, not intractable, without status migrainosus: Secondary | ICD-10-CM | POA: Diagnosis not present

## 2019-12-20 DIAGNOSIS — G43109 Migraine with aura, not intractable, without status migrainosus: Secondary | ICD-10-CM | POA: Diagnosis not present

## 2019-12-21 DIAGNOSIS — G43109 Migraine with aura, not intractable, without status migrainosus: Secondary | ICD-10-CM | POA: Diagnosis not present

## 2019-12-22 DIAGNOSIS — G43109 Migraine with aura, not intractable, without status migrainosus: Secondary | ICD-10-CM | POA: Diagnosis not present

## 2019-12-23 DIAGNOSIS — G43109 Migraine with aura, not intractable, without status migrainosus: Secondary | ICD-10-CM | POA: Diagnosis not present

## 2019-12-24 DIAGNOSIS — G43109 Migraine with aura, not intractable, without status migrainosus: Secondary | ICD-10-CM | POA: Diagnosis not present

## 2019-12-25 DIAGNOSIS — G43109 Migraine with aura, not intractable, without status migrainosus: Secondary | ICD-10-CM | POA: Diagnosis not present

## 2019-12-26 DIAGNOSIS — G43109 Migraine with aura, not intractable, without status migrainosus: Secondary | ICD-10-CM | POA: Diagnosis not present

## 2019-12-27 DIAGNOSIS — G43109 Migraine with aura, not intractable, without status migrainosus: Secondary | ICD-10-CM | POA: Diagnosis not present

## 2019-12-28 DIAGNOSIS — G43109 Migraine with aura, not intractable, without status migrainosus: Secondary | ICD-10-CM | POA: Diagnosis not present

## 2019-12-29 DIAGNOSIS — G43109 Migraine with aura, not intractable, without status migrainosus: Secondary | ICD-10-CM | POA: Diagnosis not present

## 2019-12-30 DIAGNOSIS — G43109 Migraine with aura, not intractable, without status migrainosus: Secondary | ICD-10-CM | POA: Diagnosis not present

## 2019-12-31 DIAGNOSIS — G43109 Migraine with aura, not intractable, without status migrainosus: Secondary | ICD-10-CM | POA: Diagnosis not present

## 2020-01-01 DIAGNOSIS — G43109 Migraine with aura, not intractable, without status migrainosus: Secondary | ICD-10-CM | POA: Diagnosis not present

## 2020-01-03 ENCOUNTER — Other Ambulatory Visit: Payer: Self-pay

## 2020-01-03 ENCOUNTER — Encounter: Payer: Self-pay | Admitting: Internal Medicine

## 2020-01-03 ENCOUNTER — Ambulatory Visit: Payer: Medicaid Other | Attending: Internal Medicine | Admitting: Internal Medicine

## 2020-01-03 VITALS — BP 132/73 | HR 74 | Temp 98.4°F | Resp 16 | Wt 151.6 lb

## 2020-01-03 DIAGNOSIS — Z23 Encounter for immunization: Secondary | ICD-10-CM

## 2020-01-03 DIAGNOSIS — E1165 Type 2 diabetes mellitus with hyperglycemia: Secondary | ICD-10-CM | POA: Diagnosis not present

## 2020-01-03 DIAGNOSIS — M79642 Pain in left hand: Secondary | ICD-10-CM

## 2020-01-03 DIAGNOSIS — E1142 Type 2 diabetes mellitus with diabetic polyneuropathy: Secondary | ICD-10-CM | POA: Diagnosis not present

## 2020-01-03 DIAGNOSIS — I10 Essential (primary) hypertension: Secondary | ICD-10-CM

## 2020-01-03 DIAGNOSIS — E785 Hyperlipidemia, unspecified: Secondary | ICD-10-CM | POA: Diagnosis not present

## 2020-01-03 DIAGNOSIS — IMO0002 Reserved for concepts with insufficient information to code with codable children: Secondary | ICD-10-CM

## 2020-01-03 DIAGNOSIS — E1169 Type 2 diabetes mellitus with other specified complication: Secondary | ICD-10-CM

## 2020-01-03 DIAGNOSIS — E039 Hypothyroidism, unspecified: Secondary | ICD-10-CM

## 2020-01-03 DIAGNOSIS — R234 Changes in skin texture: Secondary | ICD-10-CM

## 2020-01-03 DIAGNOSIS — R413 Other amnesia: Secondary | ICD-10-CM

## 2020-01-03 DIAGNOSIS — Z87891 Personal history of nicotine dependence: Secondary | ICD-10-CM

## 2020-01-03 LAB — POCT GLYCOSYLATED HEMOGLOBIN (HGB A1C): HbA1c, POC (controlled diabetic range): 7.6 % — AB (ref 0.0–7.0)

## 2020-01-03 LAB — GLUCOSE, POCT (MANUAL RESULT ENTRY): POC Glucose: 141 mg/dl — AB (ref 70–99)

## 2020-01-03 MED ORDER — LISINOPRIL 5 MG PO TABS: 5.0000 mg | ORAL_TABLET | Freq: Every day | ORAL | 1 refills | Status: DC

## 2020-01-03 MED ORDER — LEVOTHYROXINE SODIUM 50 MCG PO TABS: 50.0000 ug | ORAL_TABLET | Freq: Every day | ORAL | 3 refills | Status: DC

## 2020-01-03 MED ORDER — DICLOFENAC SODIUM 1 % EX GEL: 2.0000 g | Freq: Four times a day (QID) | CUTANEOUS | 3 refills | Status: DC

## 2020-01-03 NOTE — Progress Notes (Signed)
Patient ID: April Mcmahon, female    DOB: 1960-11-30  MRN: [id number]  CC: Chronic disease management  Subjective: April Mcmahon is a 59 y.o. female who presents for chronic ds management.  Daughter-in-law, Enis Gash interprets.  They decline using video interpreter.  She has all of her medication bottles with her today. Her concerns today include:  Pt with hx of DMneuropathy, HTN, HL, tob dep, migraines, Vit D def, DJD knees and plantar fasciitis, Abn LFT with steatosis on U/S, BL CTS, hypothyroid  Hypothyroid: Since I last saw her, she was diagnosed with having hypothyroid.  She is taking levothyroxine consistently.  Denies any hair loss of feeling of fatigue at this time.  Last TSH was slightly above 5.  DM: Results for orders placed or performed in visit on 01/03/20  POCT glucose (manual entry)  Result Value Ref Range   POC Glucose 141 (A) 70 - 99 mg/dl  POCT glycosylated hemoglobin (Hb A1C)  Result Value Ref Range   Hemoglobin A1C     HbA1c POC (<> result, manual entry)     HbA1c, POC (prediabetic range)     HbA1c, POC (controlled diabetic range) 7.6 (A) 0.0 - 7.0 %   Checks BS BID.  Morning BS 70s, evenings in the 120s.  Can tell when BS low. Feels tired and dizzy She is on oral medications that include Amaryl, Jardiance, Metformin, Januvia Doing good with eating habits Walks with her grand child Overdue for eye exam.  Denies blurred vision.  HTN: Compliant with lisinopril and amlodipine. Limit salt in the foods.  No chest pains or shortness of breath.  No lower extremity edema.  Tob dep: quit almost 1 yr  Saw physician assistant several weeks ago and complained of dry and crack tips of fingers.  Using Vaseline which has softened the fingertips but skin is still cracked and sometimes bleeds.  Memory difficulties: Still has an aide who comes in daily for about 4 hours.  Daughter-in-law reports that they do not allow her to use the stove because she is forgetful.  About 2  weeks ago she forgot and left the oven on.  Daughter-in-law and patient feels that her memory decline has not gotten any worse since I last saw her about a year ago.     Pain LT hand 4-5th fingers and lateral wrist x15 days.  No initiating factors.  No swelling or redness.  It hurts when she holds objects.  HM:  COVID vaccine completed. Patient Active Problem List   Diagnosis Date Noted  . Microalbuminuria 11/15/2018  . Muscle cramps 02/21/2018  . Hepatic steatosis 12/23/2017  . MCI (mild cognitive impairment) 02/14/2017  . Diabetic peripheral neuropathy ([place]) 02/14/2017  . Memory loss 12/27/2016  . Dysfunctional uterine bleeding 08/30/2016  . Analgesic rebound headache 06/24/2016  . Esophageal dysphagia 06/21/2016  . Tobacco dependence 01/20/2016  . Plantar fasciitis, bilateral 12/24/2015  . 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 Diabetes type 2, controlled ([place]) 06/19/2015  . Essential hypertension 06/19/2015  . Vitamin D deficiency 09/24/2013  . Hyperlipidemia 09/24/2013  . Migraine 05/15/2013  . Low back pain 08/18/2012     Current Outpatient Medications on File Prior to Visit  Medication Sig Dispense Refill  . ACCU-CHEK AVIVA PLUS test strip AS DIRECTED 100 strip 13  . Accu-Chek Softclix Lancets lancets AS DIRECTED 100 each 12  . amLODipine (NORVASC) 10 MG tablet TAKE ONE TABLET BY MOUTH EVERY DAY 30 tablet 6  . atorvastatin (LIPITOR)  10 MG tablet TAKE ONE TABLET BY MOUTH DAILY 90 tablet 0  . cephALEXin (KEFLEX) 500 MG capsule Take 1 capsule (500 mg total) by mouth 4 (four) times daily. (Patient not taking: Reported on 10/03/2019) 28 capsule 0  . gabapentin (NEURONTIN) 300 MG capsule TAKE ONE CAPSULE BY MOUTH TWICE DAILY 180 capsule 1  . glimepiride (AMARYL) 2 MG tablet TAKE ONE TABLET BY MOUTH BEFORE BREAKFAST 90 tablet 1  . ibuprofen (ADVIL) 600 MG tablet Take 1 tablet (600 mg total) by mouth every 6 (six) hours as needed. 30 tablet 0  . JANUVIA 50 MG tablet  TAKE ONE TABLET BY MOUTH EVERY DAY 90 tablet 0  . JARDIANCE 10 MG TABS tablet TAKE ONE TABLET BY MOUTH DAILY 30 tablet 6  . levothyroxine (SYNTHROID) 50 MCG tablet Take 1 tablet (50 mcg total) by mouth daily. 90 tablet 3  . lisinopril (ZESTRIL) 2.5 MG tablet TAKE TWO TABLETS BY MOUTH DAILY 180 tablet 0  . metFORMIN (GLUCOPHAGE) 1000 MG tablet TAKE ONE TABLET BY MOUTH TWICE DAILY 180 tablet 1  . methocarbamol (ROBAXIN) 500 MG tablet Take 1 tablet (500 mg total) by mouth every 8 (eight) hours as needed for muscle spasms. 30 tablet 0  . naproxen (NAPROSYN) 500 MG tablet Take 1 tablet (500 mg total) by mouth 2 (two) times daily with a meal. 60 tablet 0  . NICORETTE STARTER KIT 2 MG gum Take 1 each (2 mg total) by mouth as needed for smoking cessation. 100 each 1  . nicotine (NICODERM CQ - DOSED IN MG/24 HOURS) 21 mg/24hr patch unwrap AND APPLY ONE PATCH TO THE SKIN DAILY 28 patch 1  . triamcinolone cream (KENALOG) 0.1 % Apply 1 application topically 2 (two) times daily. 30 g 4  . VOLTAREN 1 % GEL APPLY TWO GRAMS EXTERNALLY TO AFFECTED AREA FOUR TIMES DAILY 100 g 3  . [DISCONTINUED] fluticasone (FLONASE) 50 MCG/ACT nasal spray Place 1 spray into both nostrils daily. 16 g 2  . [DISCONTINUED] SUMAtriptan (IMITREX) 100 MG tablet Take 1 tablet (100 mg total) by mouth as needed for migraine. May repeat in 2 hours if headache persists or recurs.  Do not refill in less than 30 days (Patient not taking: Reported on 11/29/2018) 10 tablet 6  . [DISCONTINUED] venlafaxine (EFFEXOR) 37.5 MG tablet Take 1 tablet (37.5 mg total) by mouth 2 (two) times daily with a meal. 60 tablet 11   No current facility-administered medications on file prior to visit.    Allergies  Allergen Reactions  . Elavil [Amitriptyline Hcl] Diarrhea and Nausea And Vomiting    Social History   Socioeconomic History  . Marital status: Married    Spouse name: Not on file  . Number of children: Not on file  . Years of education: Not on  file  . Highest education level: Not on file  Occupational History  . Not on file  Tobacco Use  . Smoking status: Former Research scientist (life sciences)  . Smokeless tobacco: Never Used  Vaping Use  . Vaping Use: Never used  Substance and Sexual Activity  . Alcohol use: No  . Drug use: No  . Sexual activity: Not on file  Other Topics Concern  . Not on file  Social History Narrative  . Not on file   Social Determinants of Health   Financial Resource Strain:   . Difficulty of Paying Living Expenses: Not on file  Food Insecurity:   . Worried About Charity fundraiser in the Last Year: Not  on file  . Ran Out of Food in the Last Year: Not on file  Transportation Needs:   . Lack of Transportation (Medical): Not on file  . Lack of Transportation (Non-Medical): Not on file  Physical Activity:   . Days of Exercise per Week: Not on file  . Minutes of Exercise per Session: Not on file  Stress:   . Feeling of Stress : Not on file  Social Connections:   . Frequency of Communication with Friends and Family: Not on file  . Frequency of Social Gatherings with Friends and Family: Not on file  . Attends Religious Services: Not on file  . Active Member of Clubs or Organizations: Not on file  . Attends Archivist Meetings: Not on file  . Marital Status: Not on file  Intimate Partner Violence:   . Fear of Current or Ex-Partner: Not on file  . Emotionally Abused: Not on file  . Physically Abused: Not on file  . Sexually Abused: Not on file    Family History  Problem Relation Age of Onset  . Migraines Mother   . Esophageal cancer Other   . Breast cancer Neg Hx   . Colon cancer Neg Hx   . Rectal cancer Neg Hx   . Stomach cancer Neg Hx     Past Surgical History:  Procedure Laterality Date  . no previous surgeries      ROS: Review of Systems Negative except as stated above  PHYSICAL EXAM: BP 132/73   Pulse 74   Temp 98.4 F (36.9 C)   Resp 16   Wt 151 lb 9.6 oz (68.8 kg)   SpO2 97%    BMI 30.62 kg/m   Wt Readings from Last 3 Encounters:  01/03/20 151 lb 9.6 oz (68.8 kg)  10/03/19 151 lb 3.2 oz (68.6 kg)  03/15/19 157 lb (71.2 kg)    Physical Exam  General appearance - alert, well appearing, and in no distress Mental status -patient answers questions appropriately. Eyes - pupils equal and reactive, extraocular eye movements intact Neck - supple, no significant adenopathy Chest - clear to auscultation, no wheezes, rales or rhonchi, symmetric air entry Heart - normal rate, regular rhythm, normal S1, S2, no murmurs, rubs, clicks or gallops Extremities - peripheral pulses normal, no pedal edema, no clubbing or cyanosis Skin: Fingertips are cracked.  No discoloration. Left hand/wrist: No edema or erythema.  No point tenderness on palpation of the wrist.  She has good range of motion of all the fingers.  Mild discomfort along the ulnar aspect of the forearm with movement of the wrist. Diabetic Foot Exam - Simple   Simple Foot Form Visual Inspection No deformities, no ulcerations, no other skin breakdown bilaterally: Yes Sensation Testing Intact to touch and monofilament testing bilaterally: Yes Pulse Check Posterior Tibialis and Dorsalis pulse intact bilaterally: Yes Comments      CMP Latest Ref Rng & Units 10/03/2019 11/13/2018 03/07/2018  Glucose 65 - 99 mg/dL 123(H) 154(H) 94  BUN 6 - 24 mg/dL 6 10 10   Creatinine 0.57 - 1.00 mg/dL 0.57 0.70 0.67  Sodium 134 - 144 mmol/L 141 140 140  Potassium 3.5 - 5.2 mmol/L 4.3 4.5 4.7  Chloride 96 - 106 mmol/L 105 102 103  CO2 20 - 29 mmol/L 22 23 22   Calcium 8.7 - 10.2 mg/dL 9.3 9.9 9.6  Total Protein 6.0 - 8.5 g/dL 7.7 7.9 7.6  Total Bilirubin 0.0 - 1.2 mg/dL 0.3 0.6 0.5  Alkaline Phos  48 - 121 IU/L 135(H) 181(H) 139(H)  AST 0 - 40 IU/L 47(H) 31 28  ALT 0 - 32 IU/L 57(H) 43(H) 36(H)   Lipid Panel     Component Value Date/Time   CHOL 165 11/13/2018 1027   TRIG 204 (H) 11/13/2018 1027   HDL 57 11/13/2018 1027    CHOLHDL 2.9 11/13/2018 1027   CHOLHDL 3.3 10/20/2015 1625   VLDL 46 (H) 10/20/2015 1625   LDLCALC 74 11/13/2018 1027    CBC    Component Value Date/Time   WBC 7.9 11/13/2018 1027   WBC 9.7 10/10/2017 1941   RBC 5.35 (H) 11/13/2018 1027   RBC 4.81 10/10/2017 1941   HGB 14.3 11/13/2018 1027   HCT 44.3 11/13/2018 1027   PLT 235 11/13/2018 1027   MCV 83 11/13/2018 1027   MCH 26.7 11/13/2018 1027   MCH 26.4 10/10/2017 1941   MCHC 32.3 11/13/2018 1027   MCHC 31.0 10/10/2017 1941   RDW 13.2 11/13/2018 1027   LYMPHSABS 2,948 10/20/2015 1625   MONOABS 335 10/20/2015 1625   EOSABS 268 10/20/2015 1625   BASOSABS 0 10/20/2015 1625   Lab Results  Component Value Date   TSH 5.390 (H) 12/04/2019   Depression screen PHQ 2/9 01/03/2020 03/15/2019 11/13/2018  Decreased Interest 1 1 1   Down, Depressed, Hopeless 0 0 0  PHQ - 2 Score 1 1 1   Altered sleeping - - -  Tired, decreased energy - - -  Change in appetite - - -  Feeling bad or failure about yourself  - - -  Trouble concentrating - - -  Moving slowly or fidgety/restless - - -  Suicidal thoughts - - -  PHQ-9 Score - - -  Some recent data might be hidden    ASSESSMENT AND PLAN:  1. Uncontrolled type 2 diabetes mellitus with peripheral neuropathy (HCC) A1c not at goal.  However patient is having morning hypoglycemia which is not good.  In the past she had fractured her foot during a hypoglycemic episode.  We will stop Januvia.  I have told the patient and daughter-in-law that if the morning blood sugars remain below 90 please let me know so that we can do further adjustment in her medications.  Our goal is to keep blood sugars before meals between 90-130 - POCT glucose (manual entry) - POCT glycosylated hemoglobin (Hb A1C) - CBC - Ambulatory referral to Ophthalmology  2. Essential hypertension Close to goal. - lisinopril (ZESTRIL) 5 MG tablet; Take 1 tablet (5 mg total) by mouth daily.  Dispense: 90 tablet; Refill: 1  3.  Former smoker Commended her on quitting.  Encouraged her to remain tobacco free.  Less than 5 minutes spent on counseling.  4. Need for immunization against influenza  - Flu Vaccine QUAD 36+ mos IM  5. Pain of left hand Likely tendinitis.  Recommend Voltaren gel - diclofenac Sodium (VOLTAREN) 1 % GEL; Apply 2 g topically 4 (four) times daily.  Dispense: 100 g; Refill: 3  6. Cracked skin Improved with Vaseline but still persists.  Encouraged her to continue using the Vaseline - Ambulatory referral to Dermatology  7. Acquired hypothyroidism - levothyroxine (SYNTHROID) 50 MCG tablet; Take 1 tablet (50 mcg total) by mouth daily.  Dispense: 90 tablet; Refill: 3  8. Hyperlipidemia associated with type 2 diabetes mellitus (HCC) - Lipid panel  9. Memory difficulties Patient likely early dementia.  Patient with good family support.  No significant behavioral issues.    Patient was given the  opportunity to ask questions.  Patient verbalized understanding of the plan and was able to repeat key elements of the plan.   Orders Placed This Encounter  Procedures  . POCT glucose (manual entry)  . POCT glycosylated hemoglobin (Hb A1C)     Requested Prescriptions    No prescriptions requested or ordered in this encounter    No follow-ups on file.  Karle Plumber, MD, FACP

## 2020-01-03 NOTE — Patient Instructions (Addendum)
Stop Januvia.  Influenza Virus Vaccine injection (Fluarix) What is this medicine? INFLUENZA VIRUS VACCINE (in floo EN zuh VAHY ruhs vak SEEN) helps to reduce the risk of getting influenza also known as the flu. This medicine may be used for other purposes; ask your health care provider or pharmacist if you have questions. COMMON BRAND NAME(S): Fluarix, Fluzone What should I tell my health care provider before I take this medicine? They need to know if you have any of these conditions:  bleeding disorder like hemophilia  fever or infection  Guillain-Barre syndrome or other neurological problems  immune system problems  infection with the human immunodeficiency virus (HIV) or AIDS  low blood platelet counts  multiple sclerosis  an unusual or allergic reaction to influenza virus vaccine, eggs, chicken proteins, latex, gentamicin, other medicines, foods, dyes or preservatives  pregnant or trying to get pregnant  breast-feeding How should I use this medicine? This vaccine is for injection into a muscle. It is given by a health care professional. A copy of Vaccine Information Statements will be given before each vaccination. Read this sheet carefully each time. The sheet may change frequently. Talk to your pediatrician regarding the use of this medicine in children. Special care may be needed. Overdosage: If you think you have taken too much of this medicine contact a poison control center or emergency room at once. NOTE: This medicine is only for you. Do not share this medicine with others. What if I miss a dose? This does not apply. What may interact with this medicine?  chemotherapy or radiation therapy  medicines that lower your immune system like etanercept, anakinra, infliximab, and adalimumab  medicines that treat or prevent blood clots like warfarin  phenytoin  steroid medicines like prednisone or cortisone  theophylline  vaccines This list may not describe all  possible interactions. Give your health care provider a list of all the medicines, herbs, non-prescription drugs, or dietary supplements you use. Also tell them if you smoke, drink alcohol, or use illegal drugs. Some items may interact with your medicine. What should I watch for while using this medicine? Report any side effects that do not go away within 3 days to your doctor or health care professional. Call your health care provider if any unusual symptoms occur within 6 weeks of receiving this vaccine. You may still catch the flu, but the illness is not usually as bad. You cannot get the flu from the vaccine. The vaccine will not protect against colds or other illnesses that may cause fever. The vaccine is needed every year. What side effects may I notice from receiving this medicine? Side effects that you should report to your doctor or health care professional as soon as possible:  allergic reactions like skin rash, itching or hives, swelling of the face, lips, or tongue Side effects that usually do not require medical attention (report to your doctor or health care professional if they continue or are bothersome):  fever  headache  muscle aches and pains  pain, tenderness, redness, or swelling at site where injected  weak or tired This list may not describe all possible side effects. Call your doctor for medical advice about side effects. You may report side effects to FDA at 1-800-FDA-1088. Where should I keep my medicine? This vaccine is only given in a clinic, pharmacy, doctor's office, or other health care setting and will not be stored at home. NOTE: This sheet is a summary. It may not cover all possible information. If  you have questions about this medicine, talk to your doctor, pharmacist, or health care provider.  2020 Elsevier/Gold Standard (2007-08-16 09:30:40)

## 2020-01-04 DIAGNOSIS — G43109 Migraine with aura, not intractable, without status migrainosus: Secondary | ICD-10-CM | POA: Diagnosis not present

## 2020-01-04 LAB — CBC
Hematocrit: 40.6 % (ref 34.0–46.6)
Hemoglobin: 13.1 g/dL (ref 11.1–15.9)
MCH: 26.1 pg — ABNORMAL LOW (ref 26.6–33.0)
MCHC: 32.3 g/dL (ref 31.5–35.7)
MCV: 81 fL (ref 79–97)
Platelets: 259 10*3/uL (ref 150–450)
RBC: 5.01 x10E6/uL (ref 3.77–5.28)
RDW: 13.1 % (ref 11.7–15.4)
WBC: 7.5 10*3/uL (ref 3.4–10.8)

## 2020-01-04 LAB — LIPID PANEL
Chol/HDL Ratio: 4.5 ratio — ABNORMAL HIGH (ref 0.0–4.4)
Cholesterol, Total: 209 mg/dL — ABNORMAL HIGH (ref 100–199)
HDL: 46 mg/dL (ref >39)
LDL Chol Calc (NIH): 71 mg/dL (ref 0–99)
Triglycerides: 596 mg/dL (ref 0–149)
VLDL Cholesterol Cal: 92 mg/dL — ABNORMAL HIGH (ref 5–40)

## 2020-01-05 ENCOUNTER — Other Ambulatory Visit: Payer: Self-pay | Admitting: Internal Medicine

## 2020-01-05 DIAGNOSIS — E1169 Type 2 diabetes mellitus with other specified complication: Secondary | ICD-10-CM

## 2020-01-05 DIAGNOSIS — G43109 Migraine with aura, not intractable, without status migrainosus: Secondary | ICD-10-CM | POA: Diagnosis not present

## 2020-01-05 MED ORDER — ATORVASTATIN CALCIUM 10 MG PO TABS: 10.0000 mg | ORAL_TABLET | Freq: Every day | ORAL | 1 refills | Status: DC

## 2020-01-05 NOTE — Progress Notes (Signed)
Let patient know that her blood cell counts including red blood cell, white blood cell and platelet counts are normal. Triglyceride level is elevated at 596 with goal being less than 150.  Please make sure that she is taking the atorvastatin.  Healthy eating habits and regular exercise will help to lower triglyceride levels.

## 2020-01-06 DIAGNOSIS — G43109 Migraine with aura, not intractable, without status migrainosus: Secondary | ICD-10-CM | POA: Diagnosis not present

## 2020-01-07 ENCOUNTER — Other Ambulatory Visit: Payer: Self-pay | Admitting: Internal Medicine

## 2020-01-07 DIAGNOSIS — G43109 Migraine with aura, not intractable, without status migrainosus: Secondary | ICD-10-CM | POA: Diagnosis not present

## 2020-01-07 DIAGNOSIS — IMO0002 Reserved for concepts with insufficient information to code with codable children: Secondary | ICD-10-CM

## 2020-01-07 DIAGNOSIS — I10 Essential (primary) hypertension: Secondary | ICD-10-CM

## 2020-01-07 DIAGNOSIS — E1142 Type 2 diabetes mellitus with diabetic polyneuropathy: Secondary | ICD-10-CM

## 2020-01-08 DIAGNOSIS — G43109 Migraine with aura, not intractable, without status migrainosus: Secondary | ICD-10-CM | POA: Diagnosis not present

## 2020-01-09 ENCOUNTER — Telehealth (INDEPENDENT_AMBULATORY_CARE_PROVIDER_SITE_OTHER): Payer: Self-pay

## 2020-01-09 DIAGNOSIS — G43109 Migraine with aura, not intractable, without status migrainosus: Secondary | ICD-10-CM | POA: Diagnosis not present

## 2020-01-09 NOTE — Telephone Encounter (Signed)
Contacted pt to go over lab results spoke to pt daughter and pt daughter doesn't have any questions or concerns

## 2020-01-10 ENCOUNTER — Ambulatory Visit (HOSPITAL_COMMUNITY)
Admission: EM | Admit: 2020-01-10 | Discharge: 2020-01-10 | Disposition: A | Discharge status: Home / Self Care | Payer: Medicaid Other | Attending: Family Medicine | Admitting: Family Medicine

## 2020-01-10 ENCOUNTER — Other Ambulatory Visit: Payer: Self-pay

## 2020-01-10 ENCOUNTER — Encounter (HOSPITAL_COMMUNITY): Payer: Self-pay | Admitting: Emergency Medicine

## 2020-01-10 DIAGNOSIS — Z7984 Long term (current) use of oral hypoglycemic drugs: Secondary | ICD-10-CM | POA: Diagnosis not present

## 2020-01-10 DIAGNOSIS — M25562 Pain in left knee: Secondary | ICD-10-CM | POA: Insufficient documentation

## 2020-01-10 DIAGNOSIS — Z20822 Contact with and (suspected) exposure to covid-19: Secondary | ICD-10-CM | POA: Diagnosis not present

## 2020-01-10 DIAGNOSIS — Z79890 Hormone replacement therapy (postmenopausal): Secondary | ICD-10-CM | POA: Insufficient documentation

## 2020-01-10 DIAGNOSIS — Z791 Long term (current) use of non-steroidal anti-inflammatories (NSAID): Secondary | ICD-10-CM | POA: Insufficient documentation

## 2020-01-10 DIAGNOSIS — E785 Hyperlipidemia, unspecified: Secondary | ICD-10-CM | POA: Insufficient documentation

## 2020-01-10 DIAGNOSIS — R519 Headache, unspecified: Secondary | ICD-10-CM | POA: Diagnosis not present

## 2020-01-10 DIAGNOSIS — G8929 Other chronic pain: Secondary | ICD-10-CM | POA: Insufficient documentation

## 2020-01-10 DIAGNOSIS — R7309 Other abnormal glucose: Secondary | ICD-10-CM

## 2020-01-10 DIAGNOSIS — F32A Depression, unspecified: Secondary | ICD-10-CM | POA: Insufficient documentation

## 2020-01-10 DIAGNOSIS — R52 Pain, unspecified: Secondary | ICD-10-CM

## 2020-01-10 DIAGNOSIS — Z79899 Other long term (current) drug therapy: Secondary | ICD-10-CM | POA: Insufficient documentation

## 2020-01-10 DIAGNOSIS — M791 Myalgia, unspecified site: Secondary | ICD-10-CM | POA: Diagnosis not present

## 2020-01-10 DIAGNOSIS — Z87891 Personal history of nicotine dependence: Secondary | ICD-10-CM | POA: Insufficient documentation

## 2020-01-10 DIAGNOSIS — I10 Essential (primary) hypertension: Secondary | ICD-10-CM | POA: Diagnosis not present

## 2020-01-10 DIAGNOSIS — M25561 Pain in right knee: Secondary | ICD-10-CM | POA: Insufficient documentation

## 2020-01-10 DIAGNOSIS — G43109 Migraine with aura, not intractable, without status migrainosus: Secondary | ICD-10-CM | POA: Diagnosis not present

## 2020-01-10 DIAGNOSIS — E1142 Type 2 diabetes mellitus with diabetic polyneuropathy: Secondary | ICD-10-CM | POA: Insufficient documentation

## 2020-01-10 DIAGNOSIS — M549 Dorsalgia, unspecified: Secondary | ICD-10-CM | POA: Diagnosis not present

## 2020-01-10 LAB — RESP PANEL BY RT-PCR (FLU A&B, COVID) ARPGX2
Influenza A by PCR: NEGATIVE
Influenza B by PCR: NEGATIVE
SARS Coronavirus 2 by RT PCR: NEGATIVE

## 2020-01-10 LAB — CBG MONITORING, ED: Glucose-Capillary: 166 mg/dL — ABNORMAL HIGH (ref 70–99)

## 2020-01-10 MED ORDER — ACETAMINOPHEN 325 MG PO TABS
ORAL_TABLET | ORAL | Status: AC
  Filled 2020-01-10: qty 3

## 2020-01-10 MED ORDER — ACETAMINOPHEN 325 MG PO TABS
975.0000 mg | ORAL_TABLET | Freq: Once | ORAL | Status: AC
  Administered 2020-01-10: 975 mg via ORAL

## 2020-01-10 MED ORDER — IBUPROFEN 600 MG PO TABS: 600.0000 mg | ORAL_TABLET | Freq: Three times a day (TID) | ORAL | 0 refills | Status: DC | PRN

## 2020-01-10 NOTE — ED Provider Notes (Signed)
[facility]    CSN: [id number] Arrival date & time: 01/10/20  1019      History   Chief Complaint Chief Complaint  Patient presents with  . Blood Sugar Problem    HPI April Mcmahon is a 59 y.o. female.   Patient is a 59 year old female with past medical history of chronic knee pain, chronic back pain, chronic migraines, depression, diabetes, hyperlipidemia, hypertension.  She presents today with headache, body aches x4 days.  Symptoms have been constant.  She does not take anything for her symptoms.  She has had some fluctuating blood sugars and blood pressures.  Denies any dizziness, blurred vision, chest pain, shortness of breath.  Denies any sore throat, ear pain, cough, chest congestion, nasal congestion or rhinorrhea.  Denies any abdominal pain, nausea, vomiting or diarrhea.  No fevers.  No dysuria, hematuria or urinary frequency.  No recent sick contacts. No recent traveling.      Past Medical History:  Diagnosis Date  . Bilateral chronic knee pain 06/2014  . Chronic low back pain 06/2014  . Chronic migraine 02/1985  . Depression    hx of  . Diabetes mellitus without complication ([place]) 10/9831  . Fall   . Hyperlipidemia   . Hypertension 09/2013  . Shoulder pain     Patient Active Problem List   Diagnosis Date Noted  . Influenza vaccine needed 01/03/2020  . Former smoker 01/03/2020  . Acquired hypothyroidism 01/03/2020  . Microalbuminuria 11/15/2018  . Muscle cramps 02/21/2018  . Hepatic steatosis 12/23/2017  . MCI (mild cognitive impairment) 02/14/2017  . Diabetic peripheral neuropathy ([place]) 02/14/2017  . Memory loss 12/27/2016  . Dysfunctional uterine bleeding 08/30/2016  . Analgesic rebound headache 06/24/2016  . Esophageal dysphagia 06/21/2016  . Tobacco dependence 01/20/2016  . Plantar fasciitis, bilateral 12/24/2015  . 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 Diabetes type 2, controlled ([place]) 06/19/2015  . Essential  hypertension 06/19/2015  . Vitamin D deficiency 09/24/2013  . Hyperlipidemia 09/24/2013  . Migraine 05/15/2013  . Low back pain 08/18/2012    Past Surgical History:  Procedure Laterality Date  . no previous surgeries      OB History   No obstetric history on file.      Home Medications    Prior to Admission medications   Medication Sig Start Date End Date Taking? Authorizing Provider  ACCU-CHEK AVIVA PLUS test strip AS DIRECTED 04/03/19   Ladell Pier, MD  Accu-Chek Softclix Lancets lancets AS DIRECTED 04/03/19   Ladell Pier, MD  amLODipine (NORVASC) 10 MG tablet TAKE ONE TABLET BY MOUTH EVERY DAY 11/19/19   Ladell Pier, MD  atorvastatin (LIPITOR) 10 MG tablet Take 1 tablet (10 mg total) by mouth daily. 01/05/20   Ladell Pier, MD  diclofenac Sodium (VOLTAREN) 1 % GEL Apply 2 g topically 4 (four) times daily. 01/03/20   Ladell Pier, MD  gabapentin (NEURONTIN) 300 MG capsule TAKE ONE CAPSULE BY MOUTH TWICE DAILY 10/15/19   Ladell Pier, MD  glimepiride (AMARYL) 2 MG tablet TAKE ONE TABLET BY MOUTH BEFORE BREAKFAST 10/15/19   Ladell Pier, MD  ibuprofen (ADVIL) 600 MG tablet Take 1 tablet (600 mg total) by mouth every 8 (eight) hours as needed for moderate pain. 01/10/20   Orvan July, NP  JARDIANCE 10 MG TABS tablet TAKE ONE TABLET BY MOUTH DAILY 11/19/19   Ladell Pier, MD  levothyroxine (SYNTHROID) 50 MCG tablet Take 1 tablet (50  mcg total) by mouth daily. 01/03/20   Ladell Pier, MD  lisinopril (ZESTRIL) 5 MG tablet Take 1 tablet (5 mg total) by mouth daily. 01/03/20   Ladell Pier, MD  metFORMIN (GLUCOPHAGE) 1000 MG tablet TAKE ONE TABLET BY MOUTH TWICE DAILY 10/15/19   Ladell Pier, MD  methocarbamol (ROBAXIN) 500 MG tablet Take 1 tablet (500 mg total) by mouth every 8 (eight) hours as needed for muscle spasms. 12/14/18   LampteyMyrene Galas, MD  triamcinolone cream (KENALOG) 0.1 % Apply 1 application topically 2 (two)  times daily. 10/03/19   Argentina Donovan, PA-C  fluticasone (FLONASE) 50 MCG/ACT nasal spray Place 1 spray into both nostrils daily. 10/13/16 12/14/18  Argentina Donovan, PA-C  SUMAtriptan (IMITREX) 100 MG tablet Take 1 tablet (100 mg total) by mouth as needed for migraine. May repeat in 2 hours if headache persists or recurs.  Do not refill in less than 30 days Patient not taking: Reported on 11/29/2018 02/21/18 12/14/18  Ladell Pier, MD  venlafaxine [facility]) 37.5 MG tablet Take 1 tablet (37.5 mg total) by mouth 2 (two) times daily with a meal. 02/09/17 12/14/18  Marcial Pacas, MD    Family History Family History  Problem Relation Age of Onset  . Migraines Mother   . Esophageal cancer Other   . Breast cancer Neg Hx   . Colon cancer Neg Hx   . Rectal cancer Neg Hx   . Stomach cancer Neg Hx     Social History Social History   Tobacco Use  . Smoking status: Former Research scientist (life sciences)  . Smokeless tobacco: Never Used  Vaping Use  . Vaping Use: Never used  Substance Use Topics  . Alcohol use: No  . Drug use: No     Allergies   Elavil [amitriptyline hcl]   Review of Systems Review of Systems   Physical Exam Triage Vital Signs ED Triage Vitals  Enc Vitals Group     BP 01/10/20 1058 (!) 97/51     Pulse Rate 01/10/20 1058 71     Resp 01/10/20 1058 17     Temp 01/10/20 1058 98.2 F (36.8 C)     Temp Source 01/10/20 1058 Oral     SpO2 01/10/20 1058 97 %     Weight --      Height --      Head Circumference --      Peak Flow --      Pain Score 01/10/20 1057 10     Pain Loc --      Pain Edu? --      Excl. in [place]? --    No data found.  Updated Vital Signs BP (!) 97/51 (BP Location: Right Arm)   Pulse 71   Temp 98.2 F (36.8 C) (Oral)   Resp 17   SpO2 97%   Visual Acuity Right Eye Distance:   Left Eye Distance:   Bilateral Distance:    Right Eye Near:   Left Eye Near:    Bilateral Near:     Physical Exam Vitals and nursing note reviewed.  Constitutional:       General: She is not in acute distress.    Appearance: Normal appearance. She is not ill-appearing, toxic-appearing or diaphoretic.  HENT:     Head: Normocephalic.     Nose: Nose normal.     Mouth/Throat:     Pharynx: Oropharynx is clear.  Eyes:     Extraocular Movements: Extraocular movements intact.  Conjunctiva/sclera: Conjunctivae normal.     Pupils: Pupils are equal, round, and reactive to light.  Cardiovascular:     Rate and Rhythm: Normal rate and regular rhythm.  Pulmonary:     Effort: Pulmonary effort is normal.     Breath sounds: Normal breath sounds.  Musculoskeletal:        General: Normal range of motion.     Cervical back: Normal range of motion.  Skin:    General: Skin is warm and dry.     Findings: No rash.  Neurological:     General: No focal deficit present.     Mental Status: She is alert.  Psychiatric:        Mood and Affect: Mood normal.      UC Treatments / Results  Labs (all labs ordered are listed, but only abnormal results are displayed) Labs Reviewed  CBG MONITORING, ED - Abnormal; Notable for the following components:      Result Value   Glucose-Capillary 166 (*)    All other components within normal limits  RESP PANEL BY RT-PCR (FLU A&B, COVID) [id number]    EKG   Radiology No results found.  Procedures Procedures (including critical care time)  Medications Ordered in UC Medications  acetaminophen (TYLENOL) tablet 975 mg (975 mg Oral Given 01/10/20 1131)    Initial Impression / Assessment and Plan / UC Course  I have reviewed the triage vital signs and the nursing notes.  Pertinent labs & imaging results that were available during my care of the patient were reviewed by me and considered in my medical decision making (see chart for details).     Body aches, headache Concern for viral illness.  Swab sent for flu and Covid testing. Otherwise exam benign.  She is not having any other concerning signs or symptoms.  Blood sugar 166  here today.  Blood pressure 97/51 but usually runs In the high 90s to low 921J systolic.  No concern for dehydration. Final Clinical Impressions(s) / UC Diagnoses   Final diagnoses:  Body aches  Acute nonintractable headache, unspecified headache type     Discharge Instructions     We are treating you for body aches and headache.  Can take ibuprofen as needed.  Every 8 hours.  Tylenol given here for body aches and headache. Rest, stay hydrated. We are checking you for Covid and flu.    ED Prescriptions    Medication Sig Dispense Auth. Provider   ibuprofen (ADVIL) 600 MG tablet Take 1 tablet (600 mg total) by mouth every 8 (eight) hours as needed for moderate pain. 30 tablet Loura Halt A, NP     PDMP not reviewed this encounter.   Orvan July, NP 01/10/20 1134

## 2020-01-10 NOTE — Discharge Instructions (Addendum)
We are treating you for body aches and headache.  Can take ibuprofen as needed.  Every 8 hours.  Tylenol given here for body aches and headache. Rest, stay hydrated. We are checking you for Covid and flu.

## 2020-01-10 NOTE — ED Triage Notes (Signed)
Pt presents with fluctuating blood sugars, headache, and body pain xs 4 days.

## 2020-01-11 DIAGNOSIS — G43109 Migraine with aura, not intractable, without status migrainosus: Secondary | ICD-10-CM | POA: Diagnosis not present

## 2020-01-12 DIAGNOSIS — G43109 Migraine with aura, not intractable, without status migrainosus: Secondary | ICD-10-CM | POA: Diagnosis not present

## 2020-01-13 DIAGNOSIS — G43109 Migraine with aura, not intractable, without status migrainosus: Secondary | ICD-10-CM | POA: Diagnosis not present

## 2020-01-14 DIAGNOSIS — G43109 Migraine with aura, not intractable, without status migrainosus: Secondary | ICD-10-CM | POA: Diagnosis not present

## 2020-01-14 LAB — HM DIABETES EYE EXAM

## 2020-01-15 DIAGNOSIS — G43109 Migraine with aura, not intractable, without status migrainosus: Secondary | ICD-10-CM | POA: Diagnosis not present

## 2020-01-16 DIAGNOSIS — G43109 Migraine with aura, not intractable, without status migrainosus: Secondary | ICD-10-CM | POA: Diagnosis not present

## 2020-01-17 DIAGNOSIS — G43109 Migraine with aura, not intractable, without status migrainosus: Secondary | ICD-10-CM | POA: Diagnosis not present

## 2020-01-18 DIAGNOSIS — G43109 Migraine with aura, not intractable, without status migrainosus: Secondary | ICD-10-CM | POA: Diagnosis not present

## 2020-01-19 DIAGNOSIS — G43109 Migraine with aura, not intractable, without status migrainosus: Secondary | ICD-10-CM | POA: Diagnosis not present

## 2020-01-20 DIAGNOSIS — G43109 Migraine with aura, not intractable, without status migrainosus: Secondary | ICD-10-CM | POA: Diagnosis not present

## 2020-01-21 DIAGNOSIS — G43109 Migraine with aura, not intractable, without status migrainosus: Secondary | ICD-10-CM | POA: Diagnosis not present

## 2020-01-22 DIAGNOSIS — G43109 Migraine with aura, not intractable, without status migrainosus: Secondary | ICD-10-CM | POA: Diagnosis not present

## 2020-01-23 DIAGNOSIS — G43109 Migraine with aura, not intractable, without status migrainosus: Secondary | ICD-10-CM | POA: Diagnosis not present

## 2020-01-24 DIAGNOSIS — G43109 Migraine with aura, not intractable, without status migrainosus: Secondary | ICD-10-CM | POA: Diagnosis not present

## 2020-01-25 DIAGNOSIS — G43109 Migraine with aura, not intractable, without status migrainosus: Secondary | ICD-10-CM | POA: Diagnosis not present

## 2020-01-26 DIAGNOSIS — G43109 Migraine with aura, not intractable, without status migrainosus: Secondary | ICD-10-CM | POA: Diagnosis not present

## 2020-01-27 DIAGNOSIS — G43109 Migraine with aura, not intractable, without status migrainosus: Secondary | ICD-10-CM | POA: Diagnosis not present

## 2020-01-28 DIAGNOSIS — G43109 Migraine with aura, not intractable, without status migrainosus: Secondary | ICD-10-CM | POA: Diagnosis not present

## 2020-01-29 ENCOUNTER — Telehealth: Payer: Self-pay | Admitting: Internal Medicine

## 2020-01-29 DIAGNOSIS — G43109 Migraine with aura, not intractable, without status migrainosus: Secondary | ICD-10-CM | POA: Diagnosis not present

## 2020-01-29 NOTE — Telephone Encounter (Signed)
Pt's daughter dropped off e-CAP forms to be filled out by PCP for homecare. Please advise and thank you

## 2020-01-30 DIAGNOSIS — G43109 Migraine with aura, not intractable, without status migrainosus: Secondary | ICD-10-CM | POA: Diagnosis not present

## 2020-01-30 NOTE — Telephone Encounter (Signed)
Will place in pcp form folder

## 2020-01-31 DIAGNOSIS — G43109 Migraine with aura, not intractable, without status migrainosus: Secondary | ICD-10-CM | POA: Diagnosis not present

## 2020-02-01 DIAGNOSIS — G43109 Migraine with aura, not intractable, without status migrainosus: Secondary | ICD-10-CM | POA: Diagnosis not present

## 2020-02-03 IMAGING — DX DG CERVICAL SPINE COMPLETE 4+V
6 series · 6 of 6 positions shown · non-contrast
Comparison: None.

CLINICAL DATA: 58-year-old female with neck pain.

EXAM:
CERVICAL SPINE - COMPLETE 4+ VIEW

[c-spine lat]
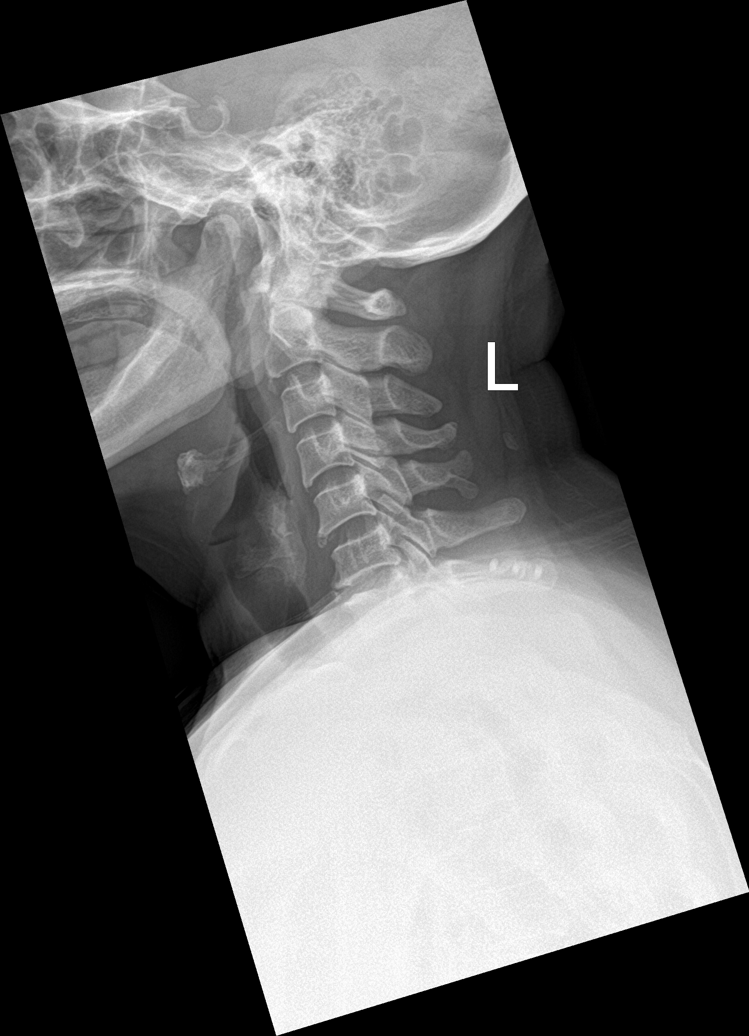

[c-spine obl (1 of 2)]
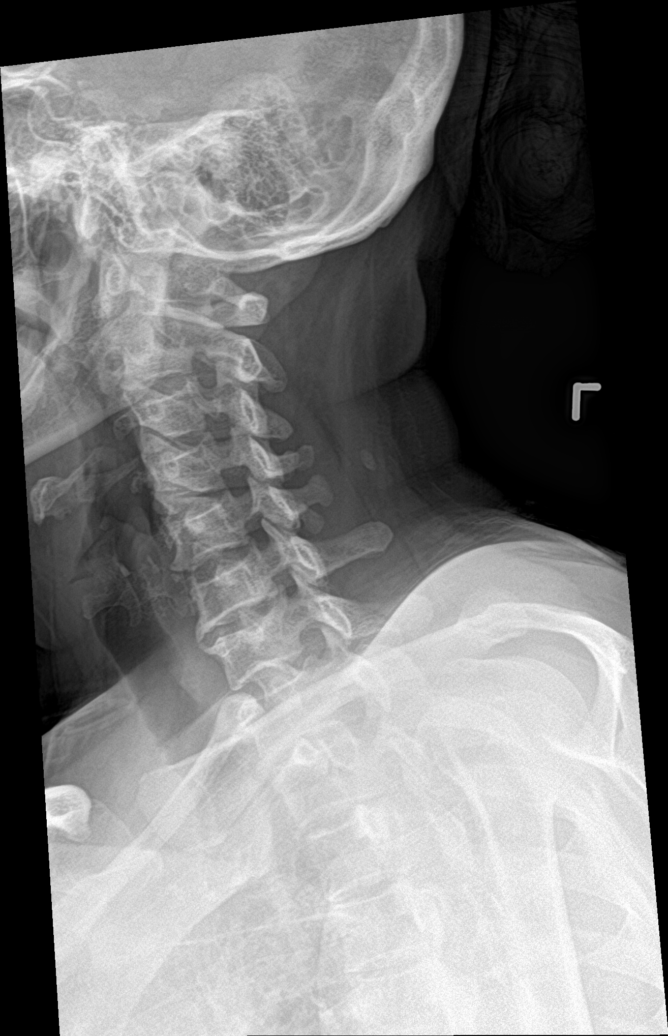

[c-spine obl (2 of 2)]
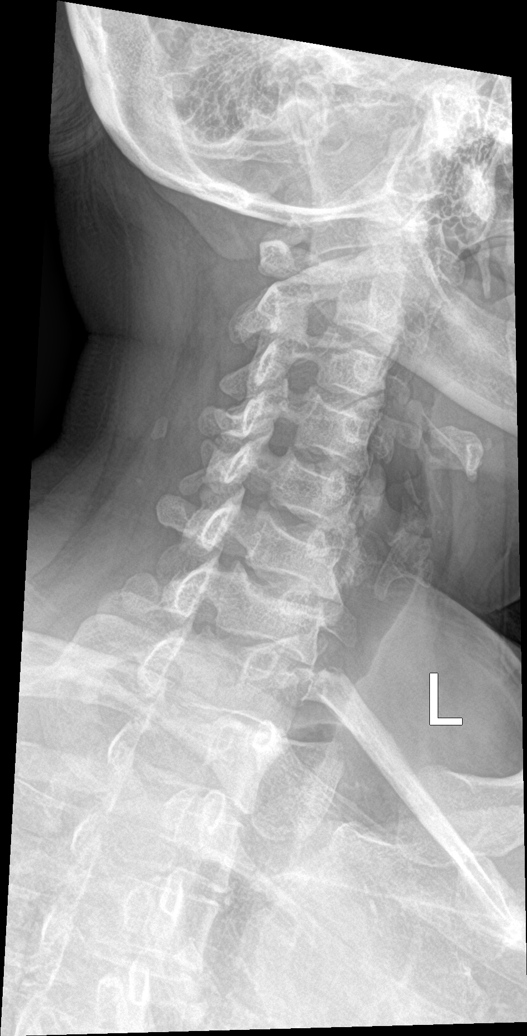

[c-spine ap]
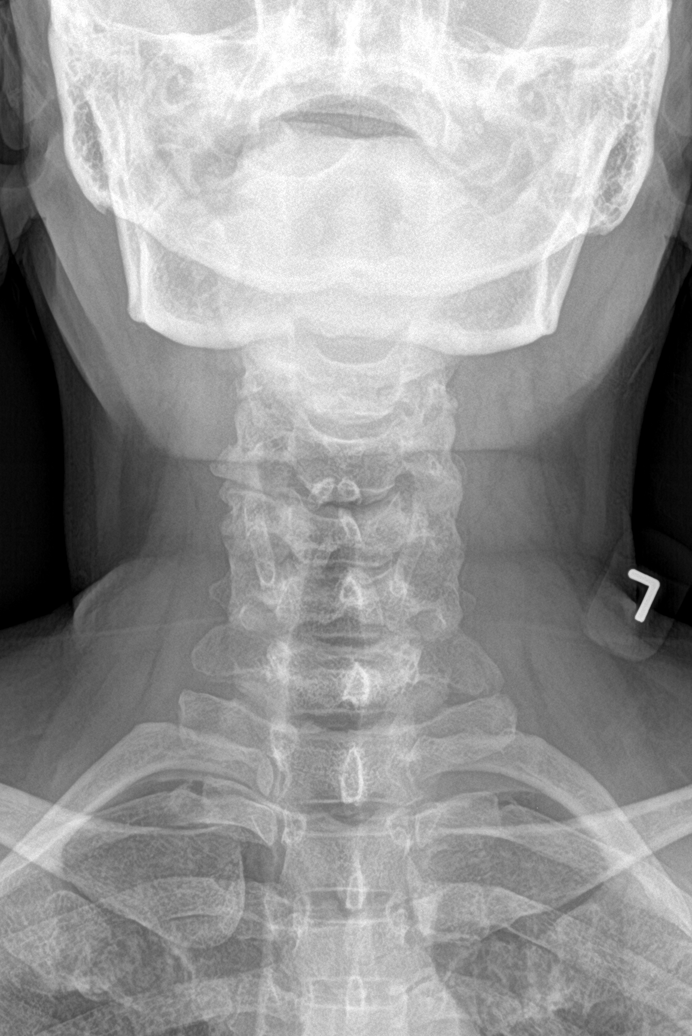

[c-spine open mouth]
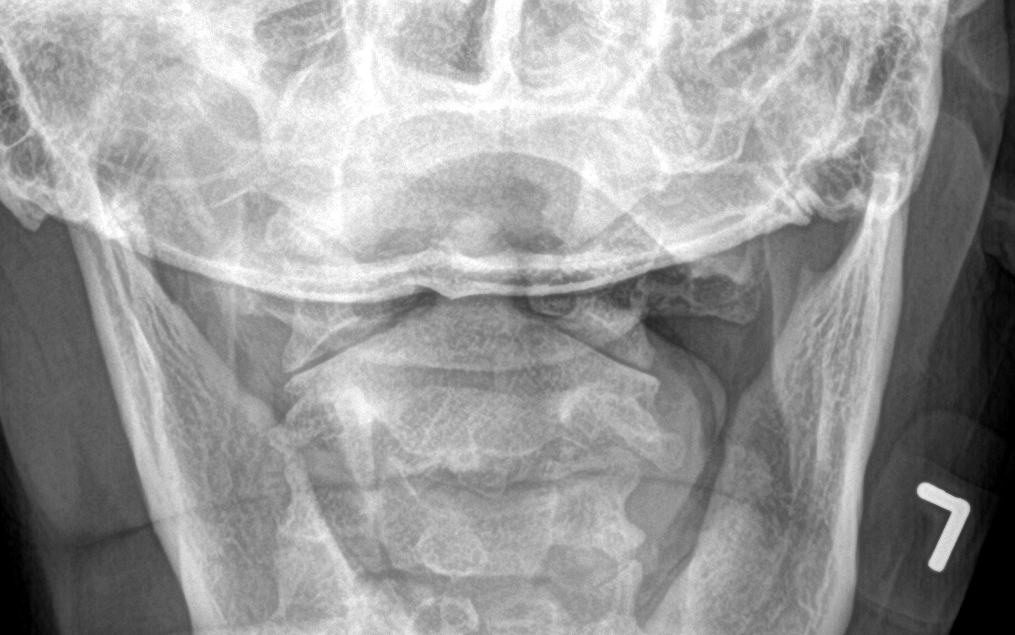

[c-spine swimmers]
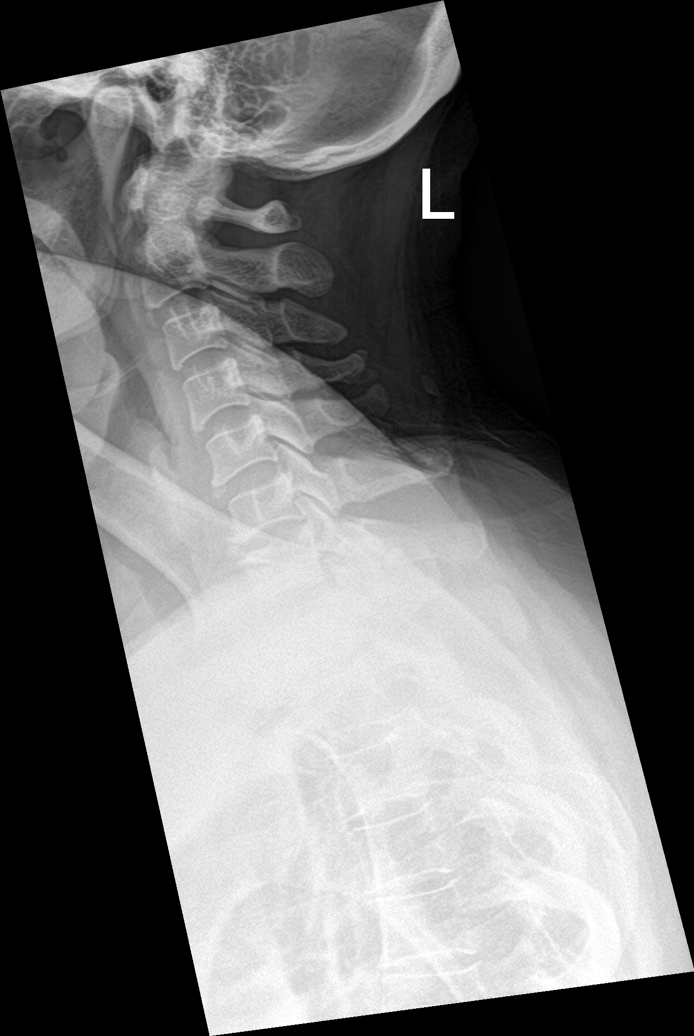

[6 of 6 positions shown; findings below may reference images not displayed]

FINDINGS: There is no acute fracture or subluxation of the cervical spine.
There is straightening of normal cervical lordosis which may be
positional or due to muscle spasm. The visualized posterior elements
and odontoid appear intact. There is anatomic alignment of the
lateral masses of C1 and C2. There is mild degenerative changes
primarily at C6-C7 with probable mild associated neural foraminal
narrowing at this level. The soft tissues are unremarkable.
IMPRESSION: 1. No acute fracture or subluxation of the cervical spine.
2. Mild degenerative changes of the cervical spine most prominent at
C6-C7.

## 2020-02-08 DIAGNOSIS — G43109 Migraine with aura, not intractable, without status migrainosus: Secondary | ICD-10-CM | POA: Diagnosis not present

## 2020-02-09 DIAGNOSIS — G43109 Migraine with aura, not intractable, without status migrainosus: Secondary | ICD-10-CM | POA: Diagnosis not present

## 2020-02-10 DIAGNOSIS — G43109 Migraine with aura, not intractable, without status migrainosus: Secondary | ICD-10-CM | POA: Diagnosis not present

## 2020-02-11 DIAGNOSIS — G43109 Migraine with aura, not intractable, without status migrainosus: Secondary | ICD-10-CM | POA: Diagnosis not present

## 2020-02-12 DIAGNOSIS — G43109 Migraine with aura, not intractable, without status migrainosus: Secondary | ICD-10-CM | POA: Diagnosis not present

## 2020-02-13 DIAGNOSIS — G43109 Migraine with aura, not intractable, without status migrainosus: Secondary | ICD-10-CM | POA: Diagnosis not present

## 2020-02-14 DIAGNOSIS — G43109 Migraine with aura, not intractable, without status migrainosus: Secondary | ICD-10-CM | POA: Diagnosis not present

## 2020-02-15 DIAGNOSIS — G43109 Migraine with aura, not intractable, without status migrainosus: Secondary | ICD-10-CM | POA: Diagnosis not present

## 2020-02-16 DIAGNOSIS — G43109 Migraine with aura, not intractable, without status migrainosus: Secondary | ICD-10-CM | POA: Diagnosis not present

## 2020-02-17 DIAGNOSIS — G43109 Migraine with aura, not intractable, without status migrainosus: Secondary | ICD-10-CM | POA: Diagnosis not present

## 2020-02-18 DIAGNOSIS — G43109 Migraine with aura, not intractable, without status migrainosus: Secondary | ICD-10-CM | POA: Diagnosis not present

## 2020-02-19 DIAGNOSIS — G43109 Migraine with aura, not intractable, without status migrainosus: Secondary | ICD-10-CM | POA: Diagnosis not present

## 2020-02-20 DIAGNOSIS — G43109 Migraine with aura, not intractable, without status migrainosus: Secondary | ICD-10-CM | POA: Diagnosis not present

## 2020-02-21 DIAGNOSIS — G43109 Migraine with aura, not intractable, without status migrainosus: Secondary | ICD-10-CM | POA: Diagnosis not present

## 2020-02-22 DIAGNOSIS — G43109 Migraine with aura, not intractable, without status migrainosus: Secondary | ICD-10-CM | POA: Diagnosis not present

## 2020-02-23 DIAGNOSIS — G43109 Migraine with aura, not intractable, without status migrainosus: Secondary | ICD-10-CM | POA: Diagnosis not present

## 2020-02-24 DIAGNOSIS — G43109 Migraine with aura, not intractable, without status migrainosus: Secondary | ICD-10-CM | POA: Diagnosis not present

## 2020-02-25 DIAGNOSIS — G43109 Migraine with aura, not intractable, without status migrainosus: Secondary | ICD-10-CM | POA: Diagnosis not present

## 2020-02-26 DIAGNOSIS — G43109 Migraine with aura, not intractable, without status migrainosus: Secondary | ICD-10-CM | POA: Diagnosis not present

## 2020-02-27 DIAGNOSIS — G43109 Migraine with aura, not intractable, without status migrainosus: Secondary | ICD-10-CM | POA: Diagnosis not present

## 2020-02-28 ENCOUNTER — Telehealth: Payer: Self-pay

## 2020-02-28 DIAGNOSIS — G43109 Migraine with aura, not intractable, without status migrainosus: Secondary | ICD-10-CM | POA: Diagnosis not present

## 2020-02-28 NOTE — Telephone Encounter (Signed)
Call received from [facility], RN Duwayne Heck [place]. She explained that she performed the needs assessment for the patient and determined that she could be eligible for 130 hours PCS/month. She is requesting Dr Wynetta Emery approve this request. The information will then be sent to their Utilization Management Dept for final approval

## 2020-02-28 NOTE — Telephone Encounter (Signed)
Call placed to [facility] # [phone] regarding document received requesting prior auth for [facility] services. Call back requested to this CM.

## 2020-02-29 DIAGNOSIS — G43109 Migraine with aura, not intractable, without status migrainosus: Secondary | ICD-10-CM | POA: Diagnosis not present

## 2020-03-01 DIAGNOSIS — G43109 Migraine with aura, not intractable, without status migrainosus: Secondary | ICD-10-CM | POA: Diagnosis not present

## 2020-03-03 NOTE — Telephone Encounter (Signed)
Signed request for increase in PCS hours faxed to [facility], RN/ [facility]

## 2020-03-12 ENCOUNTER — Other Ambulatory Visit: Payer: Self-pay | Admitting: Internal Medicine

## 2020-03-12 DIAGNOSIS — E1142 Type 2 diabetes mellitus with diabetic polyneuropathy: Secondary | ICD-10-CM

## 2020-03-12 DIAGNOSIS — IMO0002 Reserved for concepts with insufficient information to code with codable children: Secondary | ICD-10-CM

## 2020-04-03 ENCOUNTER — Other Ambulatory Visit: Payer: Self-pay | Admitting: Internal Medicine

## 2020-04-03 DIAGNOSIS — E785 Hyperlipidemia, unspecified: Secondary | ICD-10-CM

## 2020-04-03 DIAGNOSIS — I10 Essential (primary) hypertension: Secondary | ICD-10-CM

## 2020-04-03 DIAGNOSIS — E1169 Type 2 diabetes mellitus with other specified complication: Secondary | ICD-10-CM

## 2020-04-03 DIAGNOSIS — E1142 Type 2 diabetes mellitus with diabetic polyneuropathy: Secondary | ICD-10-CM

## 2020-04-03 DIAGNOSIS — IMO0002 Reserved for concepts with insufficient information to code with codable children: Secondary | ICD-10-CM

## 2020-04-18 DIAGNOSIS — R234 Changes in skin texture: Secondary | ICD-10-CM | POA: Diagnosis not present

## 2020-04-18 DIAGNOSIS — L853 Xerosis cutis: Secondary | ICD-10-CM | POA: Diagnosis not present

## 2020-05-05 ENCOUNTER — Encounter: Payer: Self-pay | Admitting: Internal Medicine

## 2020-05-05 ENCOUNTER — Other Ambulatory Visit: Payer: Self-pay

## 2020-05-05 ENCOUNTER — Ambulatory Visit: Payer: Medicaid Other | Attending: Internal Medicine | Admitting: Internal Medicine

## 2020-05-05 VITALS — BP 120/75

## 2020-05-05 DIAGNOSIS — I10 Essential (primary) hypertension: Secondary | ICD-10-CM

## 2020-05-05 DIAGNOSIS — E785 Hyperlipidemia, unspecified: Secondary | ICD-10-CM

## 2020-05-05 DIAGNOSIS — E1142 Type 2 diabetes mellitus with diabetic polyneuropathy: Secondary | ICD-10-CM

## 2020-05-05 DIAGNOSIS — E039 Hypothyroidism, unspecified: Secondary | ICD-10-CM | POA: Diagnosis not present

## 2020-05-05 DIAGNOSIS — E1169 Type 2 diabetes mellitus with other specified complication: Secondary | ICD-10-CM | POA: Diagnosis not present

## 2020-05-05 MED ORDER — IBUPROFEN 600 MG PO TABS: 600.0000 mg | ORAL_TABLET | Freq: Three times a day (TID) | ORAL | 0 refills | Status: DC | PRN

## 2020-05-05 MED ORDER — LEVOTHYROXINE SODIUM 50 MCG PO TABS: 50.0000 ug | ORAL_TABLET | Freq: Every day | ORAL | 6 refills | Status: DC

## 2020-05-05 MED ORDER — GLIMEPIRIDE 2 MG PO TABS: ORAL_TABLET | ORAL | 1 refills | Status: DC

## 2020-05-05 MED ORDER — AMLODIPINE BESYLATE 10 MG PO TABS: 1.0000 | ORAL_TABLET | Freq: Every day | ORAL | 6 refills | Status: DC

## 2020-05-05 MED ORDER — EMPAGLIFLOZIN 10 MG PO TABS: 10.0000 mg | ORAL_TABLET | Freq: Every day | ORAL | 6 refills | Status: DC

## 2020-05-05 NOTE — Progress Notes (Signed)
Virtual Visit via Telephone Note  I connected with April Mcmahon on 05/05/20 at 10:51 a.m  by telephone and verified that I am speaking with the correct person using two identifiers.  Location: Patient: home Provider: office  Participants: Myself Patient and her son Pacific interpreter: Johnsie Cancel, [phone]   I discussed the limitations, risks, security and privacy concerns of performing an evaluation and management service by telephone and the availability of in person appointments. I also discussed with the patient that there may be a patient responsible charge related to this service. The patient expressed understanding and agreed to proceed.   History of Present Illness: Pt with hx of DMneuropathy, HTN, HL, former tob dep, migraines, Vit D def, DJD knees and plantar fasciitis, Abn LFT with steatosis on U/S, BL CTS, hypothyroid.  Last seen 01/2020. Today's visit is for chronic ds management.  HL:  LDL on last 71 but TG 596.  Pt confirms that she is taking Lipitor.   DM: A1C on last visit was 7.6. She was having some morning hypoglycemia so one of her oral meds (Januvia) was d/c.  Pt confirms she has stopped taking it. Reports no recent hypoglycemic episodes. Compliant with Metformin, Amaryl, Jardiance BS in a.m 80-90 before BF and 160-180 in the afternoons. Reports she does well with eating habits.  Portions not too much and incorporates veggies in the diet.   Exercises for 10 mins 2-3x/day  HYPERTENSION Currently taking: see medication list -on Lisinopril and Norvasc Med Adherence: [x]  Yes    []  No Medication side effects: []  Yes    [x]  No Adherence with salt restriction: [x]  Yes    []  No Home Monitoring?: [x]  Yes    []  No Monitoring Frequency: []  Yes    []  No Home BP results range: BP this a.m was 120/75 SOB? []  Yes    [x]  No Chest Pain?: []  Yes    [x]  No Leg swelling?: []  Yes    [x]  No Headaches?: []  Yes    [x]  No Dizziness? []  Yes    [x]  No Comments:   Hypothyroid:   Reports compliance with Levothyroxine. No wgh changes.  No hair loss  Needs RF on med  Request RF on Ibuprofen to take for occasional jt aches.    Outpatient Encounter Medications as of 05/05/2020  Medication Sig  . ACCU-CHEK AVIVA PLUS test strip AS DIRECTED  . Accu-Chek Softclix Lancets lancets AS DIRECTED  . amLODipine (NORVASC) 10 MG tablet TAKE ONE TABLET BY MOUTH EVERY DAY  . atorvastatin (LIPITOR) 10 MG tablet TAKE ONE TABLET BY MOUTH DAILY  . diclofenac Sodium (VOLTAREN) 1 % GEL Apply 2 g topically 4 (four) times daily.  Marland Kitchen gabapentin (NEURONTIN) 300 MG capsule TAKE ONE CAPSULE BY MOUTH TWICE DAILY  . glimepiride (AMARYL) 2 MG tablet TAKE ONE TABLET BY MOUTH BEFORE BREAKFAST  . ibuprofen (ADVIL) 600 MG tablet Take 1 tablet (600 mg total) by mouth every 8 (eight) hours as needed for moderate pain.  Marland Kitchen JARDIANCE 10 MG TABS tablet TAKE ONE TABLET BY MOUTH DAILY  . levothyroxine (SYNTHROID) 50 MCG tablet Take 1 tablet (50 mcg total) by mouth daily.  Marland Kitchen lisinopril (ZESTRIL) 5 MG tablet TAKE ONE TABLET BY MOUTH DAILY  . metFORMIN (GLUCOPHAGE) 1000 MG tablet TAKE ONE TABLET BY MOUTH TWICE DAILY  . methocarbamol (ROBAXIN) 500 MG tablet Take 1 tablet (500 mg total) by mouth every 8 (eight) hours as needed for muscle spasms.  Marland Kitchen triamcinolone cream (KENALOG) 0.1 % Apply 1  application topically 2 (two) times daily.  . [DISCONTINUED] fluticasone (FLONASE) 50 MCG/ACT nasal spray Place 1 spray into both nostrils daily.  . [DISCONTINUED] SUMAtriptan (IMITREX) 100 MG tablet Take 1 tablet (100 mg total) by mouth as needed for migraine. May repeat in 2 hours if headache persists or recurs.  Do not refill in less than 30 days (Patient not taking: Reported on 11/29/2018)  . [DISCONTINUED] venlafaxine (EFFEXOR) 37.5 MG tablet Take 1 tablet (37.5 mg total) by mouth 2 (two) times daily with a meal.   No facility-administered encounter medications on file as of 05/05/2020.       Observations/Objective:  Lab Results  Component Value Date   HGBA1C 7.6 (A) 01/03/2020    Lab Results  Component Value Date   CHOL 209 (H) 01/03/2020   HDL 46 01/03/2020   LDLCALC 71 01/03/2020   TRIG 596 (HH) 01/03/2020   CHOLHDL 4.5 (H) 01/03/2020     Chemistry      Component Value Date/Time   NA 141 10/03/2019 0909   K 4.3 10/03/2019 0909   CL 105 10/03/2019 0909   CO2 22 10/03/2019 0909   BUN 6 10/03/2019 0909   CREATININE 0.57 10/03/2019 0909   CREATININE 0.62 10/20/2015 1625      Component Value Date/Time   CALCIUM 9.3 10/03/2019 0909   ALKPHOS 135 (H) 10/03/2019 0909   AST 47 (H) 10/03/2019 0909   ALT 57 (H) 10/03/2019 0909   BILITOT 0.3 10/03/2019 0909     Lab Results  Component Value Date   WBC 7.5 01/03/2020   HGB 13.1 01/03/2020   HCT 40.6 01/03/2020   MCV 81 01/03/2020   PLT 259 01/03/2020     Assessment and Plan: 1. Type 2 diabetes mellitus with peripheral neuropathy (HCC) Reported blood sugar readings are at goal.  She will continue current oral medications.  Encouraged to continue healthy eating habits and regular exercise. - Hemoglobin A1c; Future - empagliflozin (JARDIANCE) 10 MG TABS tablet; Take 1 tablet (10 mg total) by mouth daily.  Dispense: 30 tablet; Refill: 6 - glimepiride (AMARYL) 2 MG tablet; TAKE ONE TABLET BY MOUTH BEFORE BREAKFAST  Dispense: 90 tablet; Refill: 1  2. Essential hypertension At goal.  Continue lisinopril and Norvasc - amLODipine (NORVASC) 10 MG tablet; Take 1 tablet (10 mg total) by mouth daily.  Dispense: 30 tablet; Refill: 6  3. Acquired hypothyroidism Continue compliance with levothyroxine.  She will come to the lab to have level checked - TSH; Future - levothyroxine (SYNTHROID) 50 MCG tablet; Take 1 tablet (50 mcg total) by mouth daily.  Dispense: 30 tablet; Refill: 6  4. Hyperlipidemia associated with type 2 diabetes mellitus (HCC) Triglyceride quite elevated. Encourage healthy eating habits and continue  regular exercise.  Continue Lipitor.  We will recheck lipid profile - Comprehensive metabolic panel; Future - Lipid panel; Future   Follow Up Instructions: 4 mths   I discussed the assessment and treatment plan with the patient. The patient was provided an opportunity to ask questions and all were answered. The patient agreed with the plan and demonstrated an understanding of the instructions.   The patient was advised to call back or seek an in-person evaluation if the symptoms worsen or if the condition fails to improve as anticipated.  I spent 20 Minutes on this telephone encounter.   Karle Plumber, MD

## 2020-05-09 ENCOUNTER — Ambulatory Visit: Payer: Medicaid Other | Attending: Internal Medicine

## 2020-05-09 ENCOUNTER — Other Ambulatory Visit: Payer: Self-pay

## 2020-05-09 DIAGNOSIS — E1169 Type 2 diabetes mellitus with other specified complication: Secondary | ICD-10-CM | POA: Diagnosis not present

## 2020-05-09 DIAGNOSIS — E1142 Type 2 diabetes mellitus with diabetic polyneuropathy: Secondary | ICD-10-CM

## 2020-05-09 DIAGNOSIS — E039 Hypothyroidism, unspecified: Secondary | ICD-10-CM

## 2020-05-09 DIAGNOSIS — E785 Hyperlipidemia, unspecified: Secondary | ICD-10-CM | POA: Diagnosis not present

## 2020-05-10 ENCOUNTER — Other Ambulatory Visit: Payer: Self-pay | Admitting: Internal Medicine

## 2020-05-10 DIAGNOSIS — IMO0002 Reserved for concepts with insufficient information to code with codable children: Secondary | ICD-10-CM

## 2020-05-10 DIAGNOSIS — E1142 Type 2 diabetes mellitus with diabetic polyneuropathy: Secondary | ICD-10-CM

## 2020-05-10 LAB — LIPID PANEL
Chol/HDL Ratio: 3 ratio (ref 0.0–4.4)
Cholesterol, Total: 170 mg/dL (ref 100–199)
HDL: 56 mg/dL (ref >39)
LDL Chol Calc (NIH): 83 mg/dL (ref 0–99)
Triglycerides: 187 mg/dL — ABNORMAL HIGH (ref 0–149)
VLDL Cholesterol Cal: 31 mg/dL (ref 5–40)

## 2020-05-10 LAB — COMPREHENSIVE METABOLIC PANEL
ALT: 35 IU/L — ABNORMAL HIGH (ref 0–32)
AST: 30 IU/L (ref 0–40)
Albumin/Globulin Ratio: 1.6 (ref 1.2–2.2)
Albumin: 4.6 g/dL (ref 3.8–4.9)
Alkaline Phosphatase: 145 IU/L — ABNORMAL HIGH (ref 44–121)
BUN/Creatinine Ratio: 7 — ABNORMAL LOW (ref 9–23)
BUN: 6 mg/dL (ref 6–24)
Bilirubin Total: 0.4 mg/dL (ref 0.0–1.2)
CO2: 19 mmol/L — ABNORMAL LOW (ref 20–29)
Calcium: 9.5 mg/dL (ref 8.7–10.2)
Chloride: 101 mmol/L (ref 96–106)
Creatinine, Ser: 0.82 mg/dL (ref 0.57–1.00)
Globulin, Total: 2.9 g/dL (ref 1.5–4.5)
Glucose: 302 mg/dL — ABNORMAL HIGH (ref 65–99)
Potassium: 4.7 mmol/L (ref 3.5–5.2)
Sodium: 138 mmol/L (ref 134–144)
Total Protein: 7.5 g/dL (ref 6.0–8.5)
eGFR: 83 mL/min/{1.73_m2} (ref >59)

## 2020-05-10 LAB — TSH: TSH: 85.3 u[IU]/mL — ABNORMAL HIGH (ref 0.450–4.500)

## 2020-05-10 LAB — HEMOGLOBIN A1C
Est. average glucose Bld gHb Est-mCnc: 249 mg/dL
Hgb A1c MFr Bld: 10.3 % — ABNORMAL HIGH (ref 4.8–5.6)

## 2020-05-10 NOTE — Progress Notes (Signed)
Let patient and her daughter know that her diabetes is not controlled.  Her A1c is 10.3 with goal for her being between 7-8.  Please get her in with [facility] as soon as possible.  Check blood sugars before breakfast and before dinner and bring her log with her when she comes to see him.  I will also refer her to an endocrinologist to help with management of her diabetes.  Her cholesterol level has improved from 4 months ago.  She has mild elevation in some of her liver function tests but improved.  Kidney function is good.  Thyroid level is significantly abnormal.  Please confirm whether or not she is taking the levothyroxine for the thyroid consistently.  If not, encouraged her to do so.  If she had been taking it consistently, then we will need to increase the dose from 50 mg daily to 75 mg daily.  I strongly suspect she had not been taking it consistently.

## 2020-05-28 ENCOUNTER — Other Ambulatory Visit: Payer: Self-pay

## 2020-05-28 ENCOUNTER — Encounter: Payer: Self-pay | Admitting: Physician Assistant

## 2020-05-28 ENCOUNTER — Ambulatory Visit: Payer: Medicaid Other | Attending: Physician Assistant | Admitting: Physician Assistant

## 2020-05-28 DIAGNOSIS — IMO0002 Reserved for concepts with insufficient information to code with codable children: Secondary | ICD-10-CM

## 2020-05-28 DIAGNOSIS — E1142 Type 2 diabetes mellitus with diabetic polyneuropathy: Secondary | ICD-10-CM

## 2020-05-28 DIAGNOSIS — J069 Acute upper respiratory infection, unspecified: Secondary | ICD-10-CM | POA: Diagnosis not present

## 2020-05-28 DIAGNOSIS — E1165 Type 2 diabetes mellitus with hyperglycemia: Secondary | ICD-10-CM

## 2020-05-28 DIAGNOSIS — Z789 Other specified health status: Secondary | ICD-10-CM

## 2020-05-28 MED ORDER — AZITHROMYCIN 250 MG PO TABS: ORAL_TABLET | ORAL | 0 refills | Status: AC

## 2020-05-28 MED ORDER — BENZONATATE 100 MG PO CAPS: 200.0000 mg | ORAL_CAPSULE | Freq: Three times a day (TID) | ORAL | 0 refills | Status: DC | PRN

## 2020-05-28 MED ORDER — FLUTICASONE PROPIONATE 50 MCG/ACT NA SUSP: 2.0000 | Freq: Every day | NASAL | 6 refills | Status: DC

## 2020-05-28 NOTE — Progress Notes (Signed)
Patient ID: April Mcmahon, female   DOB: [birth year], 60 y.o.   MRN: [id number] Virtual Visit via Telephone Note  I connected with April Mcmahon on 05/28/20 at  8:30 AM EDT by telephone and verified that I am speaking with the correct person using two identifiers.  Location: Patient: home Provider: [facility] office  [place] with [place] interpreters translating   I discussed the limitations, risks, security and privacy concerns of performing an evaluation and management service by telephone and the availability of in person appointments. I also discussed with the patient that there may be a patient responsible charge related to this service. The patient expressed understanding and agreed to proceed.   History of Present Illness:  Cough X 2 weeks, congestion, ST, HA. No loss of taste or smell.  No fever.  She has taken covid tests that were negative.  No GI s/sx.   Blood sugars running 100-140    Observations/Objective:  NAD.  A&Ox3   Assessment and Plan:   1. Upper respiratory tract infection, unspecified type Allergies likely with secondary infection - fluticasone (FLONASE) 50 MCG/ACT nasal spray; Place 2 sprays into both nostrils daily.  Dispense: 16 g; Refill: 6 - benzonatate (TESSALON) 100 MG capsule; Take 2 capsules (200 mg total) by mouth 3 (three) times daily as needed.  Dispense: 40 capsule; Refill: 0 - azithromycin (ZITHROMAX) 250 MG tablet; Take 2 tablets on day 1, then 1 tablet daily on days 2 through 5  Dispense: 6 tablet; Refill: 0  2. Language barrier [place] interpreters used and additional time performing visit was required.   3. Uncontrolled type 2 diabetes mellitus with peripheral neuropathy (HCC) Blood sugars ok over the last week according to home readings/improving since last visit    Follow Up Instructions: Has next appt with Dr Wynetta Emery scheduled 09/26/2020   I discussed the assessment and treatment plan with the patient. The patient was provided an opportunity  to ask questions and all were answered. The patient agreed with the plan and demonstrated an understanding of the instructions.   The patient was advised to call back or seek an in-person evaluation if the symptoms worsen or if the condition fails to improve as anticipated.  I provided 15 minutes of non-face-to-face time during this encounter.   Freeman Caldron, PA-C

## 2020-07-06 DIAGNOSIS — G43109 Migraine with aura, not intractable, without status migrainosus: Secondary | ICD-10-CM | POA: Diagnosis not present

## 2020-07-07 DIAGNOSIS — G43109 Migraine with aura, not intractable, without status migrainosus: Secondary | ICD-10-CM | POA: Diagnosis not present

## 2020-07-08 DIAGNOSIS — G43109 Migraine with aura, not intractable, without status migrainosus: Secondary | ICD-10-CM | POA: Diagnosis not present

## 2020-07-09 DIAGNOSIS — G43109 Migraine with aura, not intractable, without status migrainosus: Secondary | ICD-10-CM | POA: Diagnosis not present

## 2020-07-10 DIAGNOSIS — G43109 Migraine with aura, not intractable, without status migrainosus: Secondary | ICD-10-CM | POA: Diagnosis not present

## 2020-07-11 DIAGNOSIS — G43109 Migraine with aura, not intractable, without status migrainosus: Secondary | ICD-10-CM | POA: Diagnosis not present

## 2020-07-12 DIAGNOSIS — G43109 Migraine with aura, not intractable, without status migrainosus: Secondary | ICD-10-CM | POA: Diagnosis not present

## 2020-07-13 DIAGNOSIS — G43109 Migraine with aura, not intractable, without status migrainosus: Secondary | ICD-10-CM | POA: Diagnosis not present

## 2020-07-14 DIAGNOSIS — G43109 Migraine with aura, not intractable, without status migrainosus: Secondary | ICD-10-CM | POA: Diagnosis not present

## 2020-07-15 DIAGNOSIS — G43109 Migraine with aura, not intractable, without status migrainosus: Secondary | ICD-10-CM | POA: Diagnosis not present

## 2020-07-16 DIAGNOSIS — G43109 Migraine with aura, not intractable, without status migrainosus: Secondary | ICD-10-CM | POA: Diagnosis not present

## 2020-07-17 DIAGNOSIS — G43109 Migraine with aura, not intractable, without status migrainosus: Secondary | ICD-10-CM | POA: Diagnosis not present

## 2020-07-18 DIAGNOSIS — G43109 Migraine with aura, not intractable, without status migrainosus: Secondary | ICD-10-CM | POA: Diagnosis not present

## 2020-07-19 DIAGNOSIS — G43109 Migraine with aura, not intractable, without status migrainosus: Secondary | ICD-10-CM | POA: Diagnosis not present

## 2020-07-20 DIAGNOSIS — G43109 Migraine with aura, not intractable, without status migrainosus: Secondary | ICD-10-CM | POA: Diagnosis not present

## 2020-07-21 DIAGNOSIS — G43109 Migraine with aura, not intractable, without status migrainosus: Secondary | ICD-10-CM | POA: Diagnosis not present

## 2020-07-22 DIAGNOSIS — G43109 Migraine with aura, not intractable, without status migrainosus: Secondary | ICD-10-CM | POA: Diagnosis not present

## 2020-07-23 DIAGNOSIS — G43109 Migraine with aura, not intractable, without status migrainosus: Secondary | ICD-10-CM | POA: Diagnosis not present

## 2020-07-24 ENCOUNTER — Ambulatory Visit: Payer: Medicaid Other | Admitting: Internal Medicine

## 2020-07-24 ENCOUNTER — Other Ambulatory Visit: Payer: Self-pay

## 2020-07-24 DIAGNOSIS — G43109 Migraine with aura, not intractable, without status migrainosus: Secondary | ICD-10-CM | POA: Diagnosis not present

## 2020-07-25 DIAGNOSIS — G43109 Migraine with aura, not intractable, without status migrainosus: Secondary | ICD-10-CM | POA: Diagnosis not present

## 2020-07-26 DIAGNOSIS — G43109 Migraine with aura, not intractable, without status migrainosus: Secondary | ICD-10-CM | POA: Diagnosis not present

## 2020-07-27 DIAGNOSIS — G43109 Migraine with aura, not intractable, without status migrainosus: Secondary | ICD-10-CM | POA: Diagnosis not present

## 2020-07-28 DIAGNOSIS — G43109 Migraine with aura, not intractable, without status migrainosus: Secondary | ICD-10-CM | POA: Diagnosis not present

## 2020-07-29 DIAGNOSIS — G43109 Migraine with aura, not intractable, without status migrainosus: Secondary | ICD-10-CM | POA: Diagnosis not present

## 2020-07-30 DIAGNOSIS — G43109 Migraine with aura, not intractable, without status migrainosus: Secondary | ICD-10-CM | POA: Diagnosis not present

## 2020-07-31 DIAGNOSIS — G43109 Migraine with aura, not intractable, without status migrainosus: Secondary | ICD-10-CM | POA: Diagnosis not present

## 2020-08-01 DIAGNOSIS — G43109 Migraine with aura, not intractable, without status migrainosus: Secondary | ICD-10-CM | POA: Diagnosis not present

## 2020-08-02 DIAGNOSIS — G43109 Migraine with aura, not intractable, without status migrainosus: Secondary | ICD-10-CM | POA: Diagnosis not present

## 2020-08-03 DIAGNOSIS — G43109 Migraine with aura, not intractable, without status migrainosus: Secondary | ICD-10-CM | POA: Diagnosis not present

## 2020-08-04 DIAGNOSIS — G43109 Migraine with aura, not intractable, without status migrainosus: Secondary | ICD-10-CM | POA: Diagnosis not present

## 2020-08-05 DIAGNOSIS — G43109 Migraine with aura, not intractable, without status migrainosus: Secondary | ICD-10-CM | POA: Diagnosis not present

## 2020-08-06 DIAGNOSIS — G43109 Migraine with aura, not intractable, without status migrainosus: Secondary | ICD-10-CM | POA: Diagnosis not present

## 2020-08-07 DIAGNOSIS — G43109 Migraine with aura, not intractable, without status migrainosus: Secondary | ICD-10-CM | POA: Diagnosis not present

## 2020-08-08 DIAGNOSIS — G43109 Migraine with aura, not intractable, without status migrainosus: Secondary | ICD-10-CM | POA: Diagnosis not present

## 2020-08-09 DIAGNOSIS — G43109 Migraine with aura, not intractable, without status migrainosus: Secondary | ICD-10-CM | POA: Diagnosis not present

## 2020-08-10 DIAGNOSIS — G43109 Migraine with aura, not intractable, without status migrainosus: Secondary | ICD-10-CM | POA: Diagnosis not present

## 2020-08-11 DIAGNOSIS — G43109 Migraine with aura, not intractable, without status migrainosus: Secondary | ICD-10-CM | POA: Diagnosis not present

## 2020-08-12 DIAGNOSIS — G43109 Migraine with aura, not intractable, without status migrainosus: Secondary | ICD-10-CM | POA: Diagnosis not present

## 2020-08-13 DIAGNOSIS — G43109 Migraine with aura, not intractable, without status migrainosus: Secondary | ICD-10-CM | POA: Diagnosis not present

## 2020-08-14 DIAGNOSIS — G43109 Migraine with aura, not intractable, without status migrainosus: Secondary | ICD-10-CM | POA: Diagnosis not present

## 2020-08-15 DIAGNOSIS — G43109 Migraine with aura, not intractable, without status migrainosus: Secondary | ICD-10-CM | POA: Diagnosis not present

## 2020-08-16 DIAGNOSIS — G43109 Migraine with aura, not intractable, without status migrainosus: Secondary | ICD-10-CM | POA: Diagnosis not present

## 2020-08-18 DIAGNOSIS — G43109 Migraine with aura, not intractable, without status migrainosus: Secondary | ICD-10-CM | POA: Diagnosis not present

## 2020-08-19 DIAGNOSIS — G43109 Migraine with aura, not intractable, without status migrainosus: Secondary | ICD-10-CM | POA: Diagnosis not present

## 2020-08-20 DIAGNOSIS — G43109 Migraine with aura, not intractable, without status migrainosus: Secondary | ICD-10-CM | POA: Diagnosis not present

## 2020-08-21 ENCOUNTER — Ambulatory Visit: Payer: Medicaid Other | Attending: Nurse Practitioner | Admitting: Physician Assistant

## 2020-08-21 ENCOUNTER — Other Ambulatory Visit: Payer: Self-pay

## 2020-08-21 DIAGNOSIS — R21 Rash and other nonspecific skin eruption: Secondary | ICD-10-CM | POA: Diagnosis not present

## 2020-08-21 DIAGNOSIS — G43109 Migraine with aura, not intractable, without status migrainosus: Secondary | ICD-10-CM | POA: Diagnosis not present

## 2020-08-21 DIAGNOSIS — E1165 Type 2 diabetes mellitus with hyperglycemia: Secondary | ICD-10-CM | POA: Diagnosis not present

## 2020-08-21 DIAGNOSIS — J069 Acute upper respiratory infection, unspecified: Secondary | ICD-10-CM | POA: Diagnosis not present

## 2020-08-21 DIAGNOSIS — R519 Headache, unspecified: Secondary | ICD-10-CM

## 2020-08-21 DIAGNOSIS — IMO0002 Reserved for concepts with insufficient information to code with codable children: Secondary | ICD-10-CM

## 2020-08-21 DIAGNOSIS — E1142 Type 2 diabetes mellitus with diabetic polyneuropathy: Secondary | ICD-10-CM | POA: Diagnosis not present

## 2020-08-21 MED ORDER — IBUPROFEN 600 MG PO TABS: 600.0000 mg | ORAL_TABLET | Freq: Three times a day (TID) | ORAL | 0 refills | Status: DC | PRN

## 2020-08-21 MED ORDER — BENZONATATE 100 MG PO CAPS: 200.0000 mg | ORAL_CAPSULE | Freq: Three times a day (TID) | ORAL | 0 refills | Status: DC | PRN

## 2020-08-21 NOTE — Progress Notes (Signed)
Virtual Visit via Telephone Note  I connected with [name] on 08/21/20 at  9:10 AM EDT by telephone and verified that I am speaking with the correct person using two identifiers.  Location: Patient: home Provider: [facility] office Daughter Christianne Borrow on the line interpreting   I discussed the limitations, risks, security and privacy concerns of performing an evaluation and management service by telephone and the availability of in person appointments. I also discussed with the patient that there may be a patient responsible charge related to this service. The patient expressed understanding and agreed to proceed.   History of Present Illness:  blood sugars running from 80-180.  O Headache X 3-4 days.  + cough that is dry.  No fever.  No loss of taste or smell.  No GI s/sx.  No urinary s/sx.  Has not taken covid test.  Has not taken any OTC meds.  No dizziness/CP/SOB.    Also c/o chronic rash on fingers that bleeds at times.  She has tried multiple OTC creams as well as triamcinilone without relief.   Observations/Objective: NAD.  A&Ox3   Assessment and Plan: 1. Uncontrolled type 2 diabetes mellitus with peripheral neuropathy ([place]) Current numbers not too bad-work on diet and continue medication regimen  2. Rash - Ambulatory referral to Dermatology  3. Upper respiratory tract infection, unspecified type Advised going to get a Covid test.  To ED/UC if feels worse/acute SOB, fever, etc - benzonatate (TESSALON) 100 MG capsule; Take 2 capsules (200 mg total) by mouth 3 (three) times daily as needed.  Dispense: 40 capsule; Refill: 0  4. Nonintractable headache, unspecified chronicity pattern, unspecified headache type No red flags currently - ibuprofen (ADVIL) 600 MG tablet; Take 1 tablet (600 mg total) by mouth every 8 (eight) hours as needed for moderate pain.  Dispense: 30 tablet; Refill: 0  Follow Up Instructions: Keep next scheduled appt with PCP 09/26/2020   I  discussed the assessment and treatment plan with the patient. The patient was provided an opportunity to ask questions and all were answered. The patient agreed with the plan and demonstrated an understanding of the instructions.   The patient was advised to call back or seek an in-person evaluation if the symptoms worsen or if the condition fails to improve as anticipated.  I provided 14 minutes of non-face-to-face time during this encounter.   April Caldron, PA-C  Patient ID: April Mcmahon, female   DOB: April 17, 1960, 60 y.o.   MRN: [id number]

## 2020-08-22 DIAGNOSIS — G43109 Migraine with aura, not intractable, without status migrainosus: Secondary | ICD-10-CM | POA: Diagnosis not present

## 2020-08-23 DIAGNOSIS — G43109 Migraine with aura, not intractable, without status migrainosus: Secondary | ICD-10-CM | POA: Diagnosis not present

## 2020-08-24 DIAGNOSIS — G43109 Migraine with aura, not intractable, without status migrainosus: Secondary | ICD-10-CM | POA: Diagnosis not present

## 2020-08-25 DIAGNOSIS — G43109 Migraine with aura, not intractable, without status migrainosus: Secondary | ICD-10-CM | POA: Diagnosis not present

## 2020-08-26 DIAGNOSIS — G43109 Migraine with aura, not intractable, without status migrainosus: Secondary | ICD-10-CM | POA: Diagnosis not present

## 2020-08-27 DIAGNOSIS — G43109 Migraine with aura, not intractable, without status migrainosus: Secondary | ICD-10-CM | POA: Diagnosis not present

## 2020-08-28 DIAGNOSIS — G43109 Migraine with aura, not intractable, without status migrainosus: Secondary | ICD-10-CM | POA: Diagnosis not present

## 2020-08-29 DIAGNOSIS — G43109 Migraine with aura, not intractable, without status migrainosus: Secondary | ICD-10-CM | POA: Diagnosis not present

## 2020-08-30 DIAGNOSIS — G43109 Migraine with aura, not intractable, without status migrainosus: Secondary | ICD-10-CM | POA: Diagnosis not present

## 2020-08-31 DIAGNOSIS — G43109 Migraine with aura, not intractable, without status migrainosus: Secondary | ICD-10-CM | POA: Diagnosis not present

## 2020-09-01 DIAGNOSIS — G43109 Migraine with aura, not intractable, without status migrainosus: Secondary | ICD-10-CM | POA: Diagnosis not present

## 2020-09-02 DIAGNOSIS — G43109 Migraine with aura, not intractable, without status migrainosus: Secondary | ICD-10-CM | POA: Diagnosis not present

## 2020-09-03 DIAGNOSIS — G43109 Migraine with aura, not intractable, without status migrainosus: Secondary | ICD-10-CM | POA: Diagnosis not present

## 2020-09-04 DIAGNOSIS — G43109 Migraine with aura, not intractable, without status migrainosus: Secondary | ICD-10-CM | POA: Diagnosis not present

## 2020-09-05 DIAGNOSIS — G43109 Migraine with aura, not intractable, without status migrainosus: Secondary | ICD-10-CM | POA: Diagnosis not present

## 2020-09-06 DIAGNOSIS — G43109 Migraine with aura, not intractable, without status migrainosus: Secondary | ICD-10-CM | POA: Diagnosis not present

## 2020-09-07 DIAGNOSIS — G43109 Migraine with aura, not intractable, without status migrainosus: Secondary | ICD-10-CM | POA: Diagnosis not present

## 2020-09-08 DIAGNOSIS — G43109 Migraine with aura, not intractable, without status migrainosus: Secondary | ICD-10-CM | POA: Diagnosis not present

## 2020-09-09 DIAGNOSIS — G43109 Migraine with aura, not intractable, without status migrainosus: Secondary | ICD-10-CM | POA: Diagnosis not present

## 2020-09-10 ENCOUNTER — Ambulatory Visit: Payer: Medicaid Other | Admitting: Nurse Practitioner

## 2020-09-10 DIAGNOSIS — G43109 Migraine with aura, not intractable, without status migrainosus: Secondary | ICD-10-CM | POA: Diagnosis not present

## 2020-09-11 DIAGNOSIS — G43109 Migraine with aura, not intractable, without status migrainosus: Secondary | ICD-10-CM | POA: Diagnosis not present

## 2020-09-14 DIAGNOSIS — G43109 Migraine with aura, not intractable, without status migrainosus: Secondary | ICD-10-CM | POA: Diagnosis not present

## 2020-09-15 DIAGNOSIS — G43109 Migraine with aura, not intractable, without status migrainosus: Secondary | ICD-10-CM | POA: Diagnosis not present

## 2020-09-16 DIAGNOSIS — G43109 Migraine with aura, not intractable, without status migrainosus: Secondary | ICD-10-CM | POA: Diagnosis not present

## 2020-09-17 DIAGNOSIS — G43109 Migraine with aura, not intractable, without status migrainosus: Secondary | ICD-10-CM | POA: Diagnosis not present

## 2020-09-18 DIAGNOSIS — G43109 Migraine with aura, not intractable, without status migrainosus: Secondary | ICD-10-CM | POA: Diagnosis not present

## 2020-09-19 DIAGNOSIS — G43109 Migraine with aura, not intractable, without status migrainosus: Secondary | ICD-10-CM | POA: Diagnosis not present

## 2020-09-20 DIAGNOSIS — G43109 Migraine with aura, not intractable, without status migrainosus: Secondary | ICD-10-CM | POA: Diagnosis not present

## 2020-09-21 DIAGNOSIS — G43109 Migraine with aura, not intractable, without status migrainosus: Secondary | ICD-10-CM | POA: Diagnosis not present

## 2020-09-22 DIAGNOSIS — G43109 Migraine with aura, not intractable, without status migrainosus: Secondary | ICD-10-CM | POA: Diagnosis not present

## 2020-09-23 DIAGNOSIS — G43109 Migraine with aura, not intractable, without status migrainosus: Secondary | ICD-10-CM | POA: Diagnosis not present

## 2020-09-24 DIAGNOSIS — G43109 Migraine with aura, not intractable, without status migrainosus: Secondary | ICD-10-CM | POA: Diagnosis not present

## 2020-09-25 DIAGNOSIS — G43109 Migraine with aura, not intractable, without status migrainosus: Secondary | ICD-10-CM | POA: Diagnosis not present

## 2020-09-26 ENCOUNTER — Encounter: Payer: Self-pay | Admitting: Internal Medicine

## 2020-09-26 ENCOUNTER — Other Ambulatory Visit: Payer: Self-pay

## 2020-09-26 ENCOUNTER — Ambulatory Visit: Payer: Medicaid Other | Attending: Internal Medicine | Admitting: Internal Medicine

## 2020-09-26 ENCOUNTER — Other Ambulatory Visit: Payer: Self-pay | Admitting: Internal Medicine

## 2020-09-26 VITALS — BP 136/84 | HR 69 | Resp 16 | Wt 148.8 lb

## 2020-09-26 DIAGNOSIS — Z794 Long term (current) use of insulin: Secondary | ICD-10-CM | POA: Diagnosis not present

## 2020-09-26 DIAGNOSIS — Z23 Encounter for immunization: Secondary | ICD-10-CM

## 2020-09-26 DIAGNOSIS — Z79890 Hormone replacement therapy (postmenopausal): Secondary | ICD-10-CM | POA: Diagnosis not present

## 2020-09-26 DIAGNOSIS — Z7984 Long term (current) use of oral hypoglycemic drugs: Secondary | ICD-10-CM | POA: Diagnosis not present

## 2020-09-26 DIAGNOSIS — E039 Hypothyroidism, unspecified: Secondary | ICD-10-CM | POA: Diagnosis not present

## 2020-09-26 DIAGNOSIS — Z87891 Personal history of nicotine dependence: Secondary | ICD-10-CM | POA: Insufficient documentation

## 2020-09-26 DIAGNOSIS — IMO0002 Reserved for concepts with insufficient information to code with codable children: Secondary | ICD-10-CM

## 2020-09-26 DIAGNOSIS — Z1231 Encounter for screening mammogram for malignant neoplasm of breast: Secondary | ICD-10-CM

## 2020-09-26 DIAGNOSIS — G43109 Migraine with aura, not intractable, without status migrainosus: Secondary | ICD-10-CM | POA: Diagnosis not present

## 2020-09-26 DIAGNOSIS — Z79899 Other long term (current) drug therapy: Secondary | ICD-10-CM | POA: Diagnosis not present

## 2020-09-26 DIAGNOSIS — E785 Hyperlipidemia, unspecified: Secondary | ICD-10-CM | POA: Diagnosis not present

## 2020-09-26 DIAGNOSIS — Z791 Long term (current) use of non-steroidal anti-inflammatories (NSAID): Secondary | ICD-10-CM | POA: Insufficient documentation

## 2020-09-26 DIAGNOSIS — I10 Essential (primary) hypertension: Secondary | ICD-10-CM | POA: Diagnosis not present

## 2020-09-26 DIAGNOSIS — E1165 Type 2 diabetes mellitus with hyperglycemia: Secondary | ICD-10-CM | POA: Insufficient documentation

## 2020-09-26 DIAGNOSIS — E1142 Type 2 diabetes mellitus with diabetic polyneuropathy: Secondary | ICD-10-CM

## 2020-09-26 DIAGNOSIS — E1169 Type 2 diabetes mellitus with other specified complication: Secondary | ICD-10-CM

## 2020-09-26 LAB — POCT GLYCOSYLATED HEMOGLOBIN (HGB A1C): HbA1c, POC (controlled diabetic range): 11.3 % — AB (ref 0.0–7.0)

## 2020-09-26 LAB — GLUCOSE, POCT (MANUAL RESULT ENTRY): POC Glucose: 213 mg/dl — AB (ref 70–99)

## 2020-09-26 MED ORDER — GLIMEPIRIDE 2 MG PO TABS: ORAL_TABLET | ORAL | 1 refills | Status: DC

## 2020-09-26 MED ORDER — ACCU-CHEK SOFTCLIX LANCETS MISC: 12 refills | Status: DC

## 2020-09-26 MED ORDER — LANTUS SOLOSTAR 100 UNIT/ML ~~LOC~~ SOPN: 10.0000 [IU] | PEN_INJECTOR | Freq: Every day | SUBCUTANEOUS | 99 refills | Status: DC

## 2020-09-26 MED ORDER — ACCU-CHEK AVIVA PLUS W/DEVICE KIT: PACK | 0 refills | Status: DC

## 2020-09-26 MED ORDER — PEN NEEDLES 31G X 8 MM MISC: 6 refills | Status: DC

## 2020-09-26 MED ORDER — METFORMIN HCL 1000 MG PO TABS: 1000.0000 mg | ORAL_TABLET | Freq: Two times a day (BID) | ORAL | 1 refills | Status: DC

## 2020-09-26 MED ORDER — ACCU-CHEK AVIVA PLUS VI STRP: ORAL_STRIP | 13 refills | Status: DC

## 2020-09-26 NOTE — Progress Notes (Signed)
Patient ID: April Mcmahon, female    DOB: 12/07/1960  MRN: [id number]  CC: Diabetes and Hypertension   Subjective: April Mcmahon is a 60 y.o. female who presents for chronic ds management.  Daughter-in-law, Enis Gash interprets. Her concerns today include:  Pt with hx of DM neuropathy, HTN, HL, former tob dep, migraines, Vit D def, DJD knees and plantar fasciitis, Abn LFT with steatosis on U/S, BL CTS, hypothyroid  She has medications with her.  Medication bottles that are not in the bag are gabapentin, Amaryl, metformin,  DM: Results for orders placed or performed in visit on 09/26/20  POCT glucose (manual entry)  Result Value Ref Range   POC Glucose 213 (A) 70 - 99 mg/dl  POCT glycosylated hemoglobin (Hb A1C)  Result Value Ref Range   Hemoglobin A1C     HbA1c POC (<> result, manual entry)     HbA1c, POC (prediabetic range)     HbA1c, POC (controlled diabetic range) 11.3 (A) 0.0 - 7.0 %   Has Jardiance bottle but not Metformin and Amaryl.  Reports bottles at home for the other 2 and that she is taking them also.  Does not have Neurontin with her Meter broken, therefore not checking BS Reports good eating habits Walks for 15 mins 2-3 times a day every day  HTN: On lisinopril and Norvasc.  She is supposed to be on lisinopril 5 mg daily.  Her current bottle is for the 2.5 mg tablets and it says to take 2 tablets daily.  However patient did not realize that she was supposed to take 2 and has only been taking 1.  She limits salt in the foods.  Denies any chest pains or shortness of breath.  No swelling in the legs.    HL: She reports that she is taking tolerating Lipitor.  Last LDL was 83.  Thyroid: Thyroid level that was checked in April of this year was significantly abnormal with a range of 85.  She tells me that she is taking the levothyroxine 50 mcg consistently every day.  However I note that the current bottle that she has with her was filled 04/01/20 with 90 day supply and she  still has refills on the bottle indicating to me that she may not have been taking it consistently.  However her daughter-in-law tells me that she had another bottle at home that they got from a different pharmacy so she did not start the current bottle that she has until she had ran out of the previous 1.   Patient Active Problem List   Diagnosis Date Noted   Hyperlipidemia associated with type 2 diabetes mellitus ([place]) 05/05/2020   Influenza vaccine needed 01/03/2020   Former smoker 01/03/2020   Acquired hypothyroidism 01/03/2020   Microalbuminuria 11/15/2018   Muscle cramps 02/21/2018   Hepatic steatosis 12/23/2017   MCI (mild cognitive impairment) 02/14/2017   Diabetic peripheral neuropathy ([place]) 02/14/2017   Memory loss 12/27/2016   Dysfunctional uterine bleeding 08/30/2016   Analgesic rebound headache 06/24/2016   Esophageal dysphagia 06/21/2016   Tobacco dependence 01/20/2016   Plantar fasciitis, bilateral 12/24/2015   DJD (degenerative joint disease) of knee 06/20/2015   Type 2 diabetes mellitus with peripheral neuropathy ([place]) 06/19/2015   Essential hypertension 06/19/2015   Vitamin D deficiency 09/24/2013   Migraine 05/15/2013   Low back pain 08/18/2012     Current Outpatient Medications on File Prior to Visit  Medication Sig Dispense Refill   amLODipine (NORVASC) 10 MG tablet  Take 1 tablet (10 mg total) by mouth daily. 30 tablet 6   atorvastatin (LIPITOR) 10 MG tablet TAKE ONE TABLET BY MOUTH DAILY 90 tablet 2   diclofenac Sodium (VOLTAREN) 1 % GEL Apply 2 g topically 4 (four) times daily. 100 g 3   empagliflozin (JARDIANCE) 10 MG TABS tablet Take 1 tablet (10 mg total) by mouth daily. 30 tablet 6   fluticasone (FLONASE) 50 MCG/ACT nasal spray Place 2 sprays into both nostrils daily. 16 g 6   gabapentin (NEURONTIN) 300 MG capsule TAKE ONE CAPSULE BY MOUTH TWICE DAILY 180 capsule 2   levothyroxine (SYNTHROID) 50 MCG tablet Take 1 tablet (50 mcg total) by mouth daily. 30  tablet 6   lisinopril (ZESTRIL) 5 MG tablet TAKE ONE TABLET BY MOUTH DAILY 90 tablet 1   [DISCONTINUED] SUMAtriptan (IMITREX) 100 MG tablet Take 1 tablet (100 mg total) by mouth as needed for migraine. May repeat in 2 hours if headache persists or recurs.  Do not refill in less than 30 days (Patient not taking: Reported on 11/29/2018) 10 tablet 6   [DISCONTINUED] venlafaxine (EFFEXOR) 37.5 MG tablet Take 1 tablet (37.5 mg total) by mouth 2 (two) times daily with a meal. 60 tablet 11   No current facility-administered medications on file prior to visit.    Allergies  Allergen Reactions   Elavil [Amitriptyline Hcl] Diarrhea and Nausea And Vomiting    Social History   Socioeconomic History   Marital status: Married    Spouse name: Not on file   Number of children: Not on file   Years of education: Not on file   Highest education level: Not on file  Occupational History   Not on file  Tobacco Use   Smoking status: Former   Smokeless tobacco: Never  Vaping Use   Vaping Use: Never used  Substance and Sexual Activity   Alcohol use: No   Drug use: No   Sexual activity: Not on file  Other Topics Concern   Not on file  Social History Narrative   Not on file   Social Determinants of Health   Financial Resource Strain: Not on file  Food Insecurity: Not on file  Transportation Needs: Not on file  Physical Activity: Not on file  Stress: Not on file  Social Connections: Not on file  Intimate Partner Violence: Not on file    Family History  Problem Relation Age of Onset   Migraines Mother    Esophageal cancer Other    Breast cancer Neg Hx    Colon cancer Neg Hx    Rectal cancer Neg Hx    Stomach cancer Neg Hx     Past Surgical History:  Procedure Laterality Date   no previous surgeries      ROS: Review of Systems Negative except as stated above  PHYSICAL EXAM: BP 136/84   Pulse 69   Resp 16   Wt 148 lb 12.8 oz (67.5 kg)   SpO2 97%   BMI 30.05 kg/m    Physical Exam  General appearance - alert, well appearing, and in no distress Mental status - normal mood, behavior, speech, dress, motor activity, and thought processes Mouth - mucous membranes moist, pharynx normal without lesions Neck - supple, no significant adenopathy Chest - clear to auscultation, no wheezes, rales or rhonchi, symmetric air entry Heart - normal rate, regular rhythm, normal S1, S2, no murmurs, rubs, clicks or gallops Extremities - peripheral pulses normal, no pedal edema, no clubbing or cyanosis  Diabetic Foot Exam - Simple   Simple Foot Form Visual Inspection See comments: Yes Sensation Testing Intact to touch and monofilament testing bilaterally: Yes Pulse Check Posterior Tibialis and Dorsalis pulse intact bilaterally: Yes Comments 6 skin on the heel and on the ball of the big toes      CMP Latest Ref Rng & Units 05/09/2020 10/03/2019 11/13/2018  Glucose 65 - 99 mg/dL 302(H) 123(H) 154(H)  BUN 6 - 24 mg/dL _0 Creatinine 0.57 - 1.00 mg/dL 0.82 0.57 0.70  Sodium 134 - 144 mmol/L 138 141 140  Potassium 3.5 - 5.2 mmol/L 4.7 4.3 4.5  Chloride 96 - 106 mmol/L 101 105 102  CO2 20 - 29 mmol/L 19(L) 22 23  Calcium 8.7 - 10.2 mg/dL 9.5 9.3 9.9  Total Protein 6.0 - 8.5 g/dL 7.5 7.7 7.9  Total Bilirubin 0.0 - 1.2 mg/dL 0.4 0.3 0.6  Alkaline Phos 44 - 121 IU/L 145(H) 135(H) 181(H)  AST 0 - 40 IU/L 30 47(H) 31  ALT 0 - 32 IU/L 35(H) 57(H) 43(H)   Lipid Panel     Component Value Date/Time   CHOL 170 05/09/2020 1006   TRIG 187 (H) 05/09/2020 1006   HDL 56 05/09/2020 1006   CHOLHDL 3.0 05/09/2020 1006   CHOLHDL 3.3 10/20/2015 1625   VLDL 46 (H) 10/20/2015 1625   LDLCALC 83 05/09/2020 1006    CBC    Component Value Date/Time   WBC 7.5 01/03/2020 1616   WBC 9.7 10/10/2017 1941   RBC 5.01 01/03/2020 1616   RBC 4.81 10/10/2017 1941   HGB 13.1 01/03/2020 1616   HCT 40.6 01/03/2020 1616   PLT 259 01/03/2020 1616   MCV 81 01/03/2020 1616   MCH 26.1 (L)  01/03/2020 1616   MCH 26.4 10/10/2017 1941   MCHC 32.3 01/03/2020 1616   MCHC 31.0 10/10/2017 1941   RDW 13.1 01/03/2020 1616   LYMPHSABS 2,948 10/20/2015 1625   MONOABS 335 10/20/2015 1625   EOSABS 268 10/20/2015 1625   BASOSABS 0 10/20/2015 1625    ASSESSMENT AND PLAN: 1. Uncontrolled type 2 diabetes mellitus with peripheral neuropathy (HCC) Significantly uncontrolled. I question whether she is really taking the Amaryl and metformin since she did not have these bottles with her today but did have the Jardiance.  She assures me that she is taking and that she just forgot and left the bottles at home for the Amaryl and metformin.  Recommend starting evening dose of Lantus insulin.  Patient is agreeable to this.  She will come back to see our clinical pharmacist next week to be taught how to use the insulin pen. -Prescription sent to her pharmacy for new diabetic testing supplies. -Discussed and encourage healthy eating habits.  Continue regular exercise. - POCT glucose (manual entry) - POCT glycosylated hemoglobin (Hb A1C) - metFORMIN (GLUCOPHAGE) 1000 MG tablet; Take 1 tablet (1,000 mg total) by mouth 2 (two) times daily.  Dispense: 180 tablet; Refill: 1 - glimepiride (AMARYL) 2 MG tablet; TAKE ONE TABLET BY MOUTH BEFORE BREAKFAST  Dispense: 90 tablet; Refill: 1 - insulin glargine (LANTUS SOLOSTAR) 100 UNIT/ML Solostar Pen; Inject 10 Units into the skin at bedtime.  Dispense: 15 mL; Refill: PRN - Insulin Pen Needle (PEN NEEDLES) 31G X 8 MM MISC; UAD  Dispense: 100 each; Refill: 6 - Blood Glucose Monitoring Suppl (ACCU-CHEK AVIVA PLUS) w/Device KIT; UAD  Dispense: 1 kit; Refill: 0 - glucose blood (ACCU-CHEK AVIVA PLUS) test strip; AS DIRECTED  Dispense: 100  strip; Refill: 13 - Accu-Chek Softclix Lancets lancets; AS DIRECTED  Dispense: 100 each; Refill: 12  2. Essential hypertension Not at goal but she has been taking lisinopril 2.5 mg daily instead of 5 mg daily.  She will start taking   two of the 2.5 mg tablets.  3. Hyperlipidemia associated with type 2 diabetes mellitus (HCC) Continue atorvastatin. - Lipid panel  4. Acquired hypothyroidism Recheck TSH today. - TSH  5. Need for vaccination against Streptococcus pneumoniae - Pneumococcal conjugate vaccine 20-valent  6. Encounter for screening mammogram for malignant neoplasm of breast - MM Digital Screening; Future  7. Need for immunization against influenza - Flu Vaccine QUAD 61moIM (Fluarix, Fluzone & Alfiuria Quad PF)     Patient was given the opportunity to ask questions.  Patient verbalized understanding of the plan and was able to repeat key elements of the plan.   Orders Placed This Encounter  Procedures   MM Digital Screening   Pneumococcal conjugate vaccine 20-valent   Flu Vaccine QUAD 619moM (Fluarix, Fluzone & Alfiuria Quad PF)   TSH   Lipid panel   POCT glucose (manual entry)   POCT glycosylated hemoglobin (Hb A1C)     Requested Prescriptions   Signed Prescriptions Disp Refills   metFORMIN (GLUCOPHAGE) 1000 MG tablet 180 tablet 1    Sig: Take 1 tablet (1,000 mg total) by mouth 2 (two) times daily.   glimepiride (AMARYL) 2 MG tablet 90 tablet 1    Sig: TAKE ONE TABLET BY MOUTH BEFORE BREAKFAST   insulin glargine (LANTUS SOLOSTAR) 100 UNIT/ML Solostar Pen 15 mL PRN    Sig: Inject 10 Units into the skin at bedtime.   Insulin Pen Needle (PEN NEEDLES) 31G X 8 MM MISC 100 each 6    Sig: UAD   Blood Glucose Monitoring Suppl (ACCU-CHEK AVIVA PLUS) w/Device KIT 1 kit 0    Sig: UAD   glucose blood (ACCU-CHEK AVIVA PLUS) test strip 100 strip 13    Sig: AS DIRECTED   Accu-Chek Softclix Lancets lancets 100 each 12    Sig: AS DIRECTED    Return in about 3 months (around 12/27/2020) for Give appt with LuLurena Joinerext week Monday to be taught how to do insulin injection.  DeKarle PlumberMD, FACP

## 2020-09-26 NOTE — Patient Instructions (Signed)
I have sent a prescription to your pharmacy for a new glucometer to check your blood sugars.  We will start you on a once daily injection of insulin called Lantus insulin.  We will have you return next week to meet with our clinical pharmacist to be taught how to inject the insulin.  Increase your cholesterol medicine called atorvastatin 10 mg to 2 tablets daily.  Your blood pressure is not controlled.  You should be taking the lisinopril 2.5 mg 2 tablets daily.

## 2020-09-27 DIAGNOSIS — G43109 Migraine with aura, not intractable, without status migrainosus: Secondary | ICD-10-CM | POA: Diagnosis not present

## 2020-09-27 LAB — TSH: TSH: 2.49 u[IU]/mL (ref 0.450–4.500)

## 2020-09-27 LAB — LIPID PANEL
Chol/HDL Ratio: 3.8 ratio (ref 0.0–4.4)
Cholesterol, Total: 210 mg/dL — ABNORMAL HIGH (ref 100–199)
HDL: 56 mg/dL (ref >39)
LDL Chol Calc (NIH): 109 mg/dL — ABNORMAL HIGH (ref 0–99)
Triglycerides: 261 mg/dL — ABNORMAL HIGH (ref 0–149)
VLDL Cholesterol Cal: 45 mg/dL — ABNORMAL HIGH (ref 5–40)

## 2020-09-27 NOTE — Telephone Encounter (Signed)
Pharmacy is requesting a "different SIG"- routing to provider.

## 2020-09-27 NOTE — Progress Notes (Signed)
Let patient's daughter know that her thyroid level is normal.  Continue taking the levothyroxine every day as prescribed.  Cholesterol level elevated.  Please make sure that she takes the atorvastatin every day as prescribed for the cholesterol.

## 2020-09-28 DIAGNOSIS — G43109 Migraine with aura, not intractable, without status migrainosus: Secondary | ICD-10-CM | POA: Diagnosis not present

## 2020-09-29 ENCOUNTER — Telehealth: Payer: Self-pay | Admitting: Pharmacist

## 2020-09-29 DIAGNOSIS — G43109 Migraine with aura, not intractable, without status migrainosus: Secondary | ICD-10-CM | POA: Diagnosis not present

## 2020-09-29 NOTE — Telephone Encounter (Signed)
-----   Message from Ladell Pier, MD sent at 09/26/2020  1:53 PM EDT ----- I saw this patient today and added Lantus insulin 10 units to her oral regimen for diabetes.  She needs to be taught how to do the insulin pen.  You do not have any openings next week.  The earliest appointment was 15 September.  I wanted to see whether you or your student would be able to get her in much sooner.

## 2020-09-29 NOTE — Telephone Encounter (Signed)
Patient has been rescheduled for 8/31

## 2020-09-29 NOTE — Telephone Encounter (Signed)
Please see Dr. Durenda Age message. I can squeeze this patient in on Wednesday (8/31) at 2p. Would you be able to call her and schedule this appointment?

## 2020-09-30 DIAGNOSIS — G43109 Migraine with aura, not intractable, without status migrainosus: Secondary | ICD-10-CM | POA: Diagnosis not present

## 2020-10-01 ENCOUNTER — Ambulatory Visit: Payer: Medicaid Other | Attending: Internal Medicine | Admitting: Pharmacist

## 2020-10-01 ENCOUNTER — Other Ambulatory Visit: Payer: Self-pay

## 2020-10-01 ENCOUNTER — Telehealth: Payer: Self-pay

## 2020-10-01 ENCOUNTER — Other Ambulatory Visit: Payer: Self-pay | Admitting: Pharmacist

## 2020-10-01 DIAGNOSIS — G43109 Migraine with aura, not intractable, without status migrainosus: Secondary | ICD-10-CM | POA: Diagnosis not present

## 2020-10-01 DIAGNOSIS — IMO0002 Reserved for concepts with insufficient information to code with codable children: Secondary | ICD-10-CM

## 2020-10-01 DIAGNOSIS — E1165 Type 2 diabetes mellitus with hyperglycemia: Secondary | ICD-10-CM

## 2020-10-01 DIAGNOSIS — E1142 Type 2 diabetes mellitus with diabetic polyneuropathy: Secondary | ICD-10-CM

## 2020-10-01 MED ORDER — ACCU-CHEK SOFTCLIX LANCETS MISC: 12 refills | Status: DC

## 2020-10-01 MED ORDER — ACCU-CHEK GUIDE ME W/DEVICE KIT: PACK | 0 refills | Status: AC

## 2020-10-01 MED ORDER — LEVEMIR FLEXTOUCH 100 UNIT/ML ~~LOC~~ SOPN: 10.0000 [IU] | PEN_INJECTOR | Freq: Every day | SUBCUTANEOUS | 0 refills | Status: DC

## 2020-10-01 MED ORDER — ACCU-CHEK GUIDE VI STRP: ORAL_STRIP | 2 refills | Status: DC

## 2020-10-01 NOTE — Telephone Encounter (Signed)
Contacted pt to go over lab results pt is aware and doesn't have any questions or concerns 

## 2020-10-01 NOTE — Progress Notes (Signed)
Patient and family member were educated on the use of the Levemir insulin pen using a Nepali interpreter. Reviewed necessary supplies and operation of the pen. Patient was able to demonstrate use. All questions and concerns were addressed.   Patient reports they were able to pick up the lancets but not insulin, glucometer, test strips, or lancets from the pharmacy. Lantus requires a PA, but Levemir is covered for $4. Sent in prescription for Levemir to the pharmacy, along with the correct glucometer, test strips, and lancets that Medicaid will cover.   Total time in face to face counseling 45 minutes.   Follow up PCP Clinic Visit on 12/22/20.

## 2020-10-02 DIAGNOSIS — G43109 Migraine with aura, not intractable, without status migrainosus: Secondary | ICD-10-CM | POA: Diagnosis not present

## 2020-10-03 DIAGNOSIS — G43109 Migraine with aura, not intractable, without status migrainosus: Secondary | ICD-10-CM | POA: Diagnosis not present

## 2020-10-04 DIAGNOSIS — G43109 Migraine with aura, not intractable, without status migrainosus: Secondary | ICD-10-CM | POA: Diagnosis not present

## 2020-10-05 DIAGNOSIS — G43109 Migraine with aura, not intractable, without status migrainosus: Secondary | ICD-10-CM | POA: Diagnosis not present

## 2020-10-06 DIAGNOSIS — G43109 Migraine with aura, not intractable, without status migrainosus: Secondary | ICD-10-CM | POA: Diagnosis not present

## 2020-10-07 DIAGNOSIS — G43109 Migraine with aura, not intractable, without status migrainosus: Secondary | ICD-10-CM | POA: Diagnosis not present

## 2020-10-08 DIAGNOSIS — G43109 Migraine with aura, not intractable, without status migrainosus: Secondary | ICD-10-CM | POA: Diagnosis not present

## 2020-10-09 DIAGNOSIS — G43109 Migraine with aura, not intractable, without status migrainosus: Secondary | ICD-10-CM | POA: Diagnosis not present

## 2020-10-10 DIAGNOSIS — G43109 Migraine with aura, not intractable, without status migrainosus: Secondary | ICD-10-CM | POA: Diagnosis not present

## 2020-10-11 DIAGNOSIS — G43109 Migraine with aura, not intractable, without status migrainosus: Secondary | ICD-10-CM | POA: Diagnosis not present

## 2020-10-12 DIAGNOSIS — G43109 Migraine with aura, not intractable, without status migrainosus: Secondary | ICD-10-CM | POA: Diagnosis not present

## 2020-10-13 DIAGNOSIS — G43109 Migraine with aura, not intractable, without status migrainosus: Secondary | ICD-10-CM | POA: Diagnosis not present

## 2020-10-14 DIAGNOSIS — G43109 Migraine with aura, not intractable, without status migrainosus: Secondary | ICD-10-CM | POA: Diagnosis not present

## 2020-10-15 DIAGNOSIS — G43109 Migraine with aura, not intractable, without status migrainosus: Secondary | ICD-10-CM | POA: Diagnosis not present

## 2020-10-16 ENCOUNTER — Ambulatory Visit: Payer: Medicaid Other | Admitting: Pharmacist

## 2020-10-16 DIAGNOSIS — G43109 Migraine with aura, not intractable, without status migrainosus: Secondary | ICD-10-CM | POA: Diagnosis not present

## 2020-10-17 ENCOUNTER — Encounter: Payer: Self-pay | Admitting: Internal Medicine

## 2020-10-17 ENCOUNTER — Ambulatory Visit: Payer: Medicaid Other | Admitting: Internal Medicine

## 2020-10-17 ENCOUNTER — Other Ambulatory Visit: Payer: Self-pay

## 2020-10-17 VITALS — BP 116/72 | HR 82 | Ht 59.0 in | Wt 149.2 lb

## 2020-10-17 DIAGNOSIS — E1142 Type 2 diabetes mellitus with diabetic polyneuropathy: Secondary | ICD-10-CM

## 2020-10-17 DIAGNOSIS — E785 Hyperlipidemia, unspecified: Secondary | ICD-10-CM | POA: Diagnosis not present

## 2020-10-17 DIAGNOSIS — E1165 Type 2 diabetes mellitus with hyperglycemia: Secondary | ICD-10-CM

## 2020-10-17 DIAGNOSIS — G43109 Migraine with aura, not intractable, without status migrainosus: Secondary | ICD-10-CM | POA: Diagnosis not present

## 2020-10-17 DIAGNOSIS — IMO0002 Reserved for concepts with insufficient information to code with codable children: Secondary | ICD-10-CM | POA: Insufficient documentation

## 2020-10-17 LAB — POCT GLYCOSYLATED HEMOGLOBIN (HGB A1C): Hemoglobin A1C: 10 % — AB (ref 4.0–5.6)

## 2020-10-17 LAB — GLUCOSE, POCT (MANUAL RESULT ENTRY): POC Glucose: 142 mg/dl — AB (ref 70–99)

## 2020-10-17 MED ORDER — LANTUS SOLOSTAR 100 UNIT/ML ~~LOC~~ SOPN: 14.0000 [IU] | PEN_INJECTOR | Freq: Every day | SUBCUTANEOUS | 6 refills | Status: DC

## 2020-10-17 MED ORDER — METFORMIN HCL 1000 MG PO TABS: 1000.0000 mg | ORAL_TABLET | Freq: Two times a day (BID) | ORAL | 3 refills | Status: DC

## 2020-10-17 MED ORDER — EMPAGLIFLOZIN 25 MG PO TABS: 25.0000 mg | ORAL_TABLET | Freq: Every day | ORAL | 3 refills | Status: DC

## 2020-10-17 MED ORDER — INSULIN PEN NEEDLE 32G X 4 MM MISC: 1.0000 | Freq: Every day | 3 refills | Status: DC

## 2020-10-17 MED ORDER — ATORVASTATIN CALCIUM 20 MG PO TABS: 20.0000 mg | ORAL_TABLET | Freq: Every day | ORAL | 3 refills | Status: DC

## 2020-10-17 NOTE — Progress Notes (Signed)
Name: April Mcmahon  MRN/ DOB: [id number], [birth year]   Age/ Sex: 60 y.o., female    PCP: Ladell Pier, MD   Reason for Endocrinology Evaluation: Type 2 Diabetes Mellitus     Date of Initial Endocrinology Visit: 10/17/2020     PATIENT IDENTIFIER: April Mcmahon is a 60 y.o. female with a past medical history of T2DM, HTN and Dyslipidemia . The patient presented for initial endocrinology clinic visit on 10/17/2020 for consultative assistance with her diabetes management.    HPI: [name] is accompanied by her daughter in-law who is interpreting today    Diagnosed with DM >6 yrs ago  Prior Medications tried/Intolerance: unknown  Currently checking blood sugars 3 x / day,  before breakfast and after lunch and dinner  Hypoglycemia episodes : initially denies but subsequently she endorsed 36 's            Hemoglobin A1c has ranged from 6.3% in 2018, peaking at 10.3% in 2022. Patient required assistance for hypoglycemia: no  Patient has required hospitalization within the last 1 year from hyper or hypoglycemia: no  In terms of diet, the patient eats 3 meals a day, occasional snacks, avoids sugar- sweetened beverages    HOME DIABETES REGIMEN: Jardiance 10 mg daily  Glimepiride 2 mg daily  Levemir 10 units daily  Metformin 1000 mg BID   Statin: yes ACE-I/ARB: yes Prior Diabetic Education: no   METER DOWNLOAD SUMMARY: Did not bring    DIABETIC COMPLICATIONS: Microvascular complications:  Neuropathy  Denies: CKD, retinopathy  Last eye exam: Completed 2022  Macrovascular complications:   Denies: CAD, PVD, CVA   PAST HISTORY: Past Medical History:  Past Medical History:  Diagnosis Date   Bilateral chronic knee pain 06/2014   Chronic low back pain 06/2014   Chronic migraine 02/1985   Depression    hx of   Diabetes mellitus without complication ([place]) 80/0349   Fall    Hyperlipidemia    Hypertension 09/2013   Shoulder pain    Past Surgical History:   Past Surgical History:  Procedure Laterality Date   no previous surgeries      Social History:  reports that she has quit smoking. She has never used smokeless tobacco. She reports that she does not drink alcohol and does not use drugs. Family History:  Family History  Problem Relation Age of Onset   Migraines Mother    Esophageal cancer Other    Breast cancer Neg Hx    Colon cancer Neg Hx    Rectal cancer Neg Hx    Stomach cancer Neg Hx      HOME MEDICATIONS: Allergies as of 10/17/2020       Reactions   Elavil [amitriptyline Hcl] Diarrhea, Nausea And Vomiting        Medication List        Accurate as of October 17, 2020  9:19 AM. If you have any questions, ask your nurse or doctor.          Accu-Chek Guide Me w/Device Kit Check blood sugar three times daily. Dx E11.42   Accu-Chek Guide test strip Generic drug: glucose blood Check blood sugar three times daily. Dx E11.42   Accu-Chek Softclix Lancets lancets Check blood sugar three times daily. Dx E11.42   amLODipine 10 MG tablet Commonly known as: NORVASC Take 1 tablet (10 mg total) by mouth daily.   atorvastatin 10 MG tablet Commonly known as: LIPITOR TAKE ONE TABLET BY MOUTH DAILY  diclofenac Sodium 1 % Gel Commonly known as: Voltaren Apply 2 g topically 4 (four) times daily.   empagliflozin 10 MG Tabs tablet Commonly known as: Jardiance Take 1 tablet (10 mg total) by mouth daily.   fluticasone 50 MCG/ACT nasal spray Commonly known as: FLONASE Place 2 sprays into both nostrils daily.   gabapentin 300 MG capsule Commonly known as: NEURONTIN TAKE ONE CAPSULE BY MOUTH TWICE DAILY   glimepiride 2 MG tablet Commonly known as: AMARYL TAKE ONE TABLET BY MOUTH BEFORE BREAKFAST   Levemir FlexTouch 100 UNIT/ML FlexPen Generic drug: insulin detemir Inject 10 Units into the skin daily.   levothyroxine 50 MCG tablet Commonly known as: SYNTHROID Take 1 tablet (50 mcg total) by mouth daily.    lisinopril 5 MG tablet Commonly known as: ZESTRIL TAKE ONE TABLET BY MOUTH DAILY   metFORMIN 1000 MG tablet Commonly known as: GLUCOPHAGE Take 1 tablet (1,000 mg total) by mouth 2 (two) times daily.   Pen Needles 31G X 8 MM Misc UAD         ALLERGIES: Allergies  Allergen Reactions   Elavil [Amitriptyline Hcl] Diarrhea and Nausea And Vomiting     REVIEW OF SYSTEMS: A comprehensive ROS was conducted with the patient and is negative except as per HPI and below:  Review of Systems  Neurological:  Positive for tingling.     OBJECTIVE:   VITAL SIGNS: BP 116/72 (BP Location: Left Arm, Patient Position: Sitting, Cuff Size: Small)   Pulse 82   Ht 4' 11"  (1.499 m)   Wt 149 lb 3.2 oz (67.7 kg)   SpO2 96%   BMI 30.13 kg/m    PHYSICAL EXAM:  General: Pt appears well and is in NAD  Lungs: Clear with good BS bilat with no rales, rhonchi, or wheezes  Heart: RRR with normal S1 and S2 and no gallops; no murmurs; no rub  Abdomen: Normoactive bowel sounds, soft, nontender, without masses or organomegaly palpable  Extremities:  Lower extremities - No pretibial edema. No lesions.  Skin: Normal texture and temperature to palpation. No rash noted. No Acanthosis nigricans/skin tags. No lipohypertrophy.  Neuro: MS is good with appropriate affect, pt is alert and Ox3    DM foot exam:    DATA REVIEWED:  Lab Results  Component Value Date   HGBA1C 10.0 (A) 10/17/2020   HGBA1C 11.3 (A) 09/26/2020   HGBA1C 10.3 (H) 05/09/2020   Lab Results  Component Value Date   MICROALBUR 3.8 01/20/2016   LDLCALC 109 (H) 09/26/2020   CREATININE 0.82 05/09/2020   Lab Results  Component Value Date   MICRALBCREAT 161 (H) 11/13/2018    Lab Results  Component Value Date   CHOL 210 (H) 09/26/2020   HDL 56 09/26/2020   LDLCALC 109 (H) 09/26/2020   TRIG 261 (H) 09/26/2020   CHOLHDL 3.8 09/26/2020        ASSESSMENT / PLAN / RECOMMENDATIONS:   1) Type 2 Diabetes Mellitus, Poorly  controlled, With neuropathic  complications - Most recent A1c of 11.3 %. Goal A1c < 7.0 %.    Plan: GENERAL: I have discussed with the patient the pathophysiology of diabetes. We went over the natural progression of the disease. I explained the complications associated with diabetes including retinopathy, nephropathy, neuropathy as well as increased risk of cardiovascular disease. We went over the benefit seen with glycemic control.   I explained to the patient that diabetic patients are at higher than normal risk for amputations Pt with language barrier  I expressed my concern to the daughter in-law about the fact that her jardiance bottle was mising from her medications and I am concerned that she is not taking it. Also, her lisinopril bottle stated a dose of 2.5 mg but her med list is 5 mg, I am concerned she is missing medications. Daughter  in charge of medications and I have advised her to compare med list to bottles at home   MEDICATIONS: - STOP Glipizide - Increase Jardiance to 25 mg daily  - Continue Metformin 1000 mg , twice a day  - I will switch Levemir to Lantus and take 14 units once daily    EDUCATION / INSTRUCTIONS: BG monitoring instructions: Patient is instructed to check her blood sugars 3 times a day, before meals . Call [facility] if: BG persistently < 70  I reviewed the Rule of 15 for the treatment of hypoglycemia in detail with the patient. Literature supplied.   2) Diabetic complications:  Eye: Does not have known diabetic retinopathy.  Neuro/ Feet: Does  have known diabetic peripheral neuropathy. Renal: Patient does not have known baseline CKD. She is  on an ACEI/ARB at present.     3) Dyslipidemia:    - LDL above goal 109 mg/dL in labs from 09/2020, She is on Atorvastatin 10 mg daily , will increase      Medication  Stop Atorvastatin 10 mg daily  Start Atorvastatin 20 mg daily   F/U in 3 months     Signed electronically  by: Mack Guise, MD  [facility]  [facility] [address], [place] [address] Phone: [phone] FAX: [phone]   CC: Ladell Pier, [place] [address] Phone: [phone]  Fax: [phone]    Return to Endocrinology clinic as below: Future Appointments  Date Time Provider [place]  12/22/2020  9:10 AM Ladell Pier, MD [id number] None

## 2020-10-17 NOTE — Patient Instructions (Addendum)
-   STOP Glipizide - Increase Jardiance to 25 mg daily  - Continue Metformin 1000 mg , twice a day  - I will switch Levemir to Lantus and take 14 units once daily  - Increase Atorvastatin to 20 mg daily ( Cholesterol )     HOW TO TREAT LOW BLOOD SUGARS (Blood sugar LESS THAN 70 MG/DL) Please follow the RULE OF 15 for the treatment of hypoglycemia treatment (when your (blood sugars are less than 70 mg/dL)   STEP 1: Take 15 grams of carbohydrates when your blood sugar is low, which includes:  3-4 GLUCOSE TABS  OR 3-4 OZ OF JUICE OR REGULAR SODA OR ONE TUBE OF GLUCOSE GEL    STEP 2: RECHECK blood sugar in 15 MINUTES STEP 3: If your blood sugar is still low at the 15 minute recheck --> then, go back to STEP 1 and treat AGAIN with another 15 grams of carbohydrates.      - ?????????? ?????????? - ????? ?? ??????????? ?????????? ?????????? - ????? ??? ??? ????????? 1000 ????????? ???? ?????????? - ? Levemir ??? Lantus ?? ????? ??????? ? ????? ?? ??? 14 ??????? ?????? - Atorvastatin ?? ????? 20 mg ???? ?????????? (???????????)    ? ?? ???? ?????? ???? ????? ????? (????? ???? 70 MG/DL ????? ??) ? ????? ???????????????? ??????? ???? 15 ?? ???? ????? ????????? (?? ??????? (????? ?????? ?????? 70 mg/dL ????? ?? ?????)  ? ??? 1: ??????? ???? ?????? ?? ????? 15 ????? ?????????????? ????????, ???? ?????? ?: ? 3-4 ??????? ????? ?? ? 3-4 ??? ?? ?? ?????? ???? ?? ? ??????? ??? ?? ?? ?????  ? ??? 2: 15 ??????? ????? ?????? ???? ????????? ??? 3: ??? ??????? ????? ???? ??? ??? 15 ??????? ???: ?????? ?? ? ???, ???????, STEP 1 ?? ?????????? ? ????? 15 ????? ???????????????? ??? ???? ????? ??????????

## 2020-10-18 DIAGNOSIS — G43109 Migraine with aura, not intractable, without status migrainosus: Secondary | ICD-10-CM | POA: Diagnosis not present

## 2020-10-19 DIAGNOSIS — G43109 Migraine with aura, not intractable, without status migrainosus: Secondary | ICD-10-CM | POA: Diagnosis not present

## 2020-10-20 DIAGNOSIS — G43109 Migraine with aura, not intractable, without status migrainosus: Secondary | ICD-10-CM | POA: Diagnosis not present

## 2020-10-21 DIAGNOSIS — G43109 Migraine with aura, not intractable, without status migrainosus: Secondary | ICD-10-CM | POA: Diagnosis not present

## 2020-10-22 DIAGNOSIS — G43109 Migraine with aura, not intractable, without status migrainosus: Secondary | ICD-10-CM | POA: Diagnosis not present

## 2020-10-23 DIAGNOSIS — G43109 Migraine with aura, not intractable, without status migrainosus: Secondary | ICD-10-CM | POA: Diagnosis not present

## 2020-10-24 DIAGNOSIS — G43109 Migraine with aura, not intractable, without status migrainosus: Secondary | ICD-10-CM | POA: Diagnosis not present

## 2020-10-25 DIAGNOSIS — G43109 Migraine with aura, not intractable, without status migrainosus: Secondary | ICD-10-CM | POA: Diagnosis not present

## 2020-10-26 DIAGNOSIS — G43109 Migraine with aura, not intractable, without status migrainosus: Secondary | ICD-10-CM | POA: Diagnosis not present

## 2020-10-27 DIAGNOSIS — G43109 Migraine with aura, not intractable, without status migrainosus: Secondary | ICD-10-CM | POA: Diagnosis not present

## 2020-10-28 DIAGNOSIS — G43109 Migraine with aura, not intractable, without status migrainosus: Secondary | ICD-10-CM | POA: Diagnosis not present

## 2020-10-29 DIAGNOSIS — G43109 Migraine with aura, not intractable, without status migrainosus: Secondary | ICD-10-CM | POA: Diagnosis not present

## 2020-10-30 DIAGNOSIS — G43109 Migraine with aura, not intractable, without status migrainosus: Secondary | ICD-10-CM | POA: Diagnosis not present

## 2020-10-31 DIAGNOSIS — G43109 Migraine with aura, not intractable, without status migrainosus: Secondary | ICD-10-CM | POA: Diagnosis not present

## 2020-11-01 DIAGNOSIS — G43109 Migraine with aura, not intractable, without status migrainosus: Secondary | ICD-10-CM | POA: Diagnosis not present

## 2020-11-02 DIAGNOSIS — G43109 Migraine with aura, not intractable, without status migrainosus: Secondary | ICD-10-CM | POA: Diagnosis not present

## 2020-11-03 DIAGNOSIS — G43109 Migraine with aura, not intractable, without status migrainosus: Secondary | ICD-10-CM | POA: Diagnosis not present

## 2020-11-04 DIAGNOSIS — G43109 Migraine with aura, not intractable, without status migrainosus: Secondary | ICD-10-CM | POA: Diagnosis not present

## 2020-11-05 DIAGNOSIS — G43109 Migraine with aura, not intractable, without status migrainosus: Secondary | ICD-10-CM | POA: Diagnosis not present

## 2020-11-06 DIAGNOSIS — G43109 Migraine with aura, not intractable, without status migrainosus: Secondary | ICD-10-CM | POA: Diagnosis not present

## 2020-11-07 DIAGNOSIS — G43109 Migraine with aura, not intractable, without status migrainosus: Secondary | ICD-10-CM | POA: Diagnosis not present

## 2020-11-08 DIAGNOSIS — G43109 Migraine with aura, not intractable, without status migrainosus: Secondary | ICD-10-CM | POA: Diagnosis not present

## 2020-11-09 DIAGNOSIS — G43109 Migraine with aura, not intractable, without status migrainosus: Secondary | ICD-10-CM | POA: Diagnosis not present

## 2020-11-10 DIAGNOSIS — G43109 Migraine with aura, not intractable, without status migrainosus: Secondary | ICD-10-CM | POA: Diagnosis not present

## 2020-11-11 DIAGNOSIS — G43109 Migraine with aura, not intractable, without status migrainosus: Secondary | ICD-10-CM | POA: Diagnosis not present

## 2020-11-12 DIAGNOSIS — G43109 Migraine with aura, not intractable, without status migrainosus: Secondary | ICD-10-CM | POA: Diagnosis not present

## 2020-11-13 DIAGNOSIS — G43109 Migraine with aura, not intractable, without status migrainosus: Secondary | ICD-10-CM | POA: Diagnosis not present

## 2020-11-14 DIAGNOSIS — G43109 Migraine with aura, not intractable, without status migrainosus: Secondary | ICD-10-CM | POA: Diagnosis not present

## 2020-11-15 DIAGNOSIS — G43109 Migraine with aura, not intractable, without status migrainosus: Secondary | ICD-10-CM | POA: Diagnosis not present

## 2020-11-16 DIAGNOSIS — G43109 Migraine with aura, not intractable, without status migrainosus: Secondary | ICD-10-CM | POA: Diagnosis not present

## 2020-11-17 DIAGNOSIS — G43109 Migraine with aura, not intractable, without status migrainosus: Secondary | ICD-10-CM | POA: Diagnosis not present

## 2020-11-18 DIAGNOSIS — G43109 Migraine with aura, not intractable, without status migrainosus: Secondary | ICD-10-CM | POA: Diagnosis not present

## 2020-11-19 DIAGNOSIS — G43109 Migraine with aura, not intractable, without status migrainosus: Secondary | ICD-10-CM | POA: Diagnosis not present

## 2020-11-20 DIAGNOSIS — G43109 Migraine with aura, not intractable, without status migrainosus: Secondary | ICD-10-CM | POA: Diagnosis not present

## 2020-11-28 DIAGNOSIS — G43109 Migraine with aura, not intractable, without status migrainosus: Secondary | ICD-10-CM | POA: Diagnosis not present

## 2020-11-29 DIAGNOSIS — G43109 Migraine with aura, not intractable, without status migrainosus: Secondary | ICD-10-CM | POA: Diagnosis not present

## 2020-11-30 DIAGNOSIS — G43109 Migraine with aura, not intractable, without status migrainosus: Secondary | ICD-10-CM | POA: Diagnosis not present

## 2020-12-01 DIAGNOSIS — G43109 Migraine with aura, not intractable, without status migrainosus: Secondary | ICD-10-CM | POA: Diagnosis not present

## 2020-12-02 DIAGNOSIS — G43109 Migraine with aura, not intractable, without status migrainosus: Secondary | ICD-10-CM | POA: Diagnosis not present

## 2020-12-03 DIAGNOSIS — G43109 Migraine with aura, not intractable, without status migrainosus: Secondary | ICD-10-CM | POA: Diagnosis not present

## 2020-12-04 DIAGNOSIS — G43109 Migraine with aura, not intractable, without status migrainosus: Secondary | ICD-10-CM | POA: Diagnosis not present

## 2020-12-05 DIAGNOSIS — G43109 Migraine with aura, not intractable, without status migrainosus: Secondary | ICD-10-CM | POA: Diagnosis not present

## 2020-12-06 DIAGNOSIS — G43109 Migraine with aura, not intractable, without status migrainosus: Secondary | ICD-10-CM | POA: Diagnosis not present

## 2020-12-07 DIAGNOSIS — G43109 Migraine with aura, not intractable, without status migrainosus: Secondary | ICD-10-CM | POA: Diagnosis not present

## 2020-12-08 DIAGNOSIS — G43109 Migraine with aura, not intractable, without status migrainosus: Secondary | ICD-10-CM | POA: Diagnosis not present

## 2020-12-09 DIAGNOSIS — G43109 Migraine with aura, not intractable, without status migrainosus: Secondary | ICD-10-CM | POA: Diagnosis not present

## 2020-12-10 DIAGNOSIS — G43109 Migraine with aura, not intractable, without status migrainosus: Secondary | ICD-10-CM | POA: Diagnosis not present

## 2020-12-11 DIAGNOSIS — G43109 Migraine with aura, not intractable, without status migrainosus: Secondary | ICD-10-CM | POA: Diagnosis not present

## 2020-12-12 DIAGNOSIS — G43109 Migraine with aura, not intractable, without status migrainosus: Secondary | ICD-10-CM | POA: Diagnosis not present

## 2020-12-13 DIAGNOSIS — G43109 Migraine with aura, not intractable, without status migrainosus: Secondary | ICD-10-CM | POA: Diagnosis not present

## 2020-12-14 DIAGNOSIS — G43109 Migraine with aura, not intractable, without status migrainosus: Secondary | ICD-10-CM | POA: Diagnosis not present

## 2020-12-15 DIAGNOSIS — G43109 Migraine with aura, not intractable, without status migrainosus: Secondary | ICD-10-CM | POA: Diagnosis not present

## 2020-12-16 DIAGNOSIS — G43109 Migraine with aura, not intractable, without status migrainosus: Secondary | ICD-10-CM | POA: Diagnosis not present

## 2020-12-17 DIAGNOSIS — G43109 Migraine with aura, not intractable, without status migrainosus: Secondary | ICD-10-CM | POA: Diagnosis not present

## 2020-12-18 DIAGNOSIS — G43109 Migraine with aura, not intractable, without status migrainosus: Secondary | ICD-10-CM | POA: Diagnosis not present

## 2020-12-19 DIAGNOSIS — G43109 Migraine with aura, not intractable, without status migrainosus: Secondary | ICD-10-CM | POA: Diagnosis not present

## 2020-12-20 DIAGNOSIS — G43109 Migraine with aura, not intractable, without status migrainosus: Secondary | ICD-10-CM | POA: Diagnosis not present

## 2020-12-21 DIAGNOSIS — G43109 Migraine with aura, not intractable, without status migrainosus: Secondary | ICD-10-CM | POA: Diagnosis not present

## 2020-12-22 ENCOUNTER — Ambulatory Visit: Payer: Medicaid Other | Attending: Internal Medicine | Admitting: Internal Medicine

## 2020-12-22 ENCOUNTER — Encounter: Payer: Self-pay | Admitting: Internal Medicine

## 2020-12-22 ENCOUNTER — Other Ambulatory Visit: Payer: Self-pay

## 2020-12-22 VITALS — BP 132/76 | HR 60 | Resp 16 | Wt 148.6 lb

## 2020-12-22 DIAGNOSIS — E1142 Type 2 diabetes mellitus with diabetic polyneuropathy: Secondary | ICD-10-CM | POA: Diagnosis not present

## 2020-12-22 DIAGNOSIS — Z23 Encounter for immunization: Secondary | ICD-10-CM | POA: Diagnosis not present

## 2020-12-22 DIAGNOSIS — Z1231 Encounter for screening mammogram for malignant neoplasm of breast: Secondary | ICD-10-CM

## 2020-12-22 DIAGNOSIS — E039 Hypothyroidism, unspecified: Secondary | ICD-10-CM | POA: Diagnosis not present

## 2020-12-22 DIAGNOSIS — I10 Essential (primary) hypertension: Secondary | ICD-10-CM

## 2020-12-22 DIAGNOSIS — G43009 Migraine without aura, not intractable, without status migrainosus: Secondary | ICD-10-CM

## 2020-12-22 DIAGNOSIS — E1169 Type 2 diabetes mellitus with other specified complication: Secondary | ICD-10-CM | POA: Diagnosis not present

## 2020-12-22 DIAGNOSIS — R058 Other specified cough: Secondary | ICD-10-CM

## 2020-12-22 DIAGNOSIS — G43109 Migraine with aura, not intractable, without status migrainosus: Secondary | ICD-10-CM | POA: Diagnosis not present

## 2020-12-22 DIAGNOSIS — E785 Hyperlipidemia, unspecified: Secondary | ICD-10-CM | POA: Diagnosis not present

## 2020-12-22 DIAGNOSIS — M170 Bilateral primary osteoarthritis of knee: Secondary | ICD-10-CM

## 2020-12-22 MED ORDER — VALSARTAN 40 MG PO TABS: 20.0000 mg | ORAL_TABLET | Freq: Every day | ORAL | 3 refills | Status: DC

## 2020-12-22 MED ORDER — SUMATRIPTAN SUCCINATE 50 MG PO TABS: ORAL_TABLET | ORAL | 2 refills | Status: DC

## 2020-12-22 MED ORDER — AMLODIPINE BESYLATE 10 MG PO TABS: 10.0000 mg | ORAL_TABLET | Freq: Every day | ORAL | 11 refills | Status: DC

## 2020-12-22 MED ORDER — MELOXICAM 15 MG PO TABS: 15.0000 mg | ORAL_TABLET | Freq: Every day | ORAL | 6 refills | Status: DC

## 2020-12-22 MED ORDER — TOPIRAMATE 25 MG PO TABS: 25.0000 mg | ORAL_TABLET | Freq: Every day | ORAL | 3 refills | Status: DC

## 2020-12-22 NOTE — Patient Instructions (Addendum)
Stop lisinopril.  It may be contributing to the cough. Start different blood pressure medication called Diovan instead.  She will take half a tablet daily.  I have sent the prescription to her pharmacy.  Decrease insulin dose to 11 units daily.  Please keep a log of your blood sugar readings and take them with you when you go to see the endocrinologist next month.  Purchase the allergy nasal spray over-the-counter called Flonase and use 1 spray daily in each nostril as needed. Purchased the allergy medication called loratadine over-the-counter and take 1 daily.  It will help with the cough.  I have sent refill on her headache medication called sumatriptan to use as needed. We have added a medication called Topamax to take at bedtime to help decrease the frequency of the headaches.  Please make sure that you are drinking several glasses of water daily to keep yourself hydrated.

## 2020-12-22 NOTE — Progress Notes (Signed)
Patient ID: April Mcmahon, female    DOB: [birth year]  MRN: [id number]  CC: Hypertension and Hypothyroidism   Subjective: April Mcmahon is a 60 y.o. female who presents for chronic disease management.  Daughter-in-law, April Mcmahon interprets. Her concerns today include:  Pt with hx of DM neuropathy, HTN, HL, former tob dep, migraines, Vit D def, DJD knees and plantar fasciitis, Abn LFT with steatosis on U/S, BL CTS, hypothyroid  Pt has meds with her except insulin.  Has bottle of Meloxicam that was rxn for arthritis of the knees but no longer on med list.  Last filled 11/2019. Requests RF  HTN: Patient on Norvasc and lisinopril.  She confirms taking 5 mg of Norvasc and 5 mg of lisinopril daily. Limits salt in the foods. Denies any chest pains or shortness of breath.   C/o headache intermittent x 1 mth, all over the head.  Occurs daily.  Can last 2 hrs-1 day. No N/V.  Positive photophobia.  Hx of migraine.  Out of Imitrex.  Takes Ibuprofen Q hrs which does not help. No lower extremity edema. C/o dry cough x 1 mth.  OTC meds do not help.  No fever or sore throat.  Endorses drainage at back of throat and itchy throat. No SOB. No wheezing  DIABETES TYPE 2 Last A1C:   Results for orders placed or performed in visit on 10/17/20  POCT glycosylated hemoglobin (Hb A1C)  Result Value Ref Range   Hemoglobin A1C 10.0 (A) 4.0 - 5.6 %   HbA1c POC (<> result, manual entry)     HbA1c, POC (prediabetic range)     HbA1c, POC (controlled diabetic range)    POCT Glucose (CBG)  Result Value Ref Range   POC Glucose 142 (A) 70 - 99 mg/dl    Med Adherence:  _0  Yes saw the endocrinologist Dr. Leonette Mcmahon in September.  A1c was 10.  She changed Lantus to Levemir 14 units, continued metformin 1 g twice a day, stopped Amaryl and increased Jardiance to 25 mg daily.  However it seems as though pt was continued on Lantus insulin instead likely due to insurance preference.  Daughter in-law states that when she gives  her the Levemir at nights pt develops vomiting and dizziness.  Pt stopped the insulin 2 wks ago because of this.  I inquire whether she checks BS during these episodes and she indicated that she does and BS has not been low during these episodes. Medication side effects:  _1  Yes    _2  No Home Monitoring?  _3  Yes TID.  Did not bring log     Home glucose results range: Gives a.m BS range 80-low 100s.  Reports dizziness when BS fall to 80 or lower. Reports BS below 80 about 2 x a wk and has to drink OC.  BS after lunch and dinner 120-130. Highest is 180 Diet Adherence: _4  Yes    _5  No Exercise: _6  Yes    _7  No Hypoglycemic episodes?: _8  Yes - see above   _9  No Numbness of the feet? _10  Yes    _11  No Retinopathy hx? _12  Yes    _13  No Last eye exam:  Comments:   HL: Reports compliance with Lipitor.  Last LDL was 109.  Hypothyroidism: Reports compliance with levothyroxine.  No hair loss. Endorse hot and cold flashes all the time.  Patient Active Problem List   Diagnosis Date Noted   Dyslipidemia 10/17/2020   Uncontrolled type 2 diabetes mellitus with peripheral neuropathy 10/17/2020  Hyperlipidemia associated with type 2 diabetes mellitus ([place]) 05/05/2020   Influenza vaccine needed 01/03/2020   Former smoker 01/03/2020   Acquired hypothyroidism 01/03/2020   Microalbuminuria 11/15/2018   Muscle cramps 02/21/2018   Hepatic steatosis 12/23/2017   MCI (mild cognitive impairment) 02/14/2017   Diabetic peripheral neuropathy ([place]) 02/14/2017   Memory loss 12/27/2016   Dysfunctional uterine bleeding 08/30/2016   Analgesic rebound headache 06/24/2016   Esophageal dysphagia 06/21/2016   Tobacco dependence 01/20/2016   Plantar fasciitis, bilateral 12/24/2015   DJD (degenerative joint disease) of knee 06/20/2015   Type 2 diabetes mellitus with peripheral neuropathy ([place]) 06/19/2015   Essential hypertension 06/19/2015   Vitamin D deficiency 09/24/2013   Migraine 05/15/2013   Low back pain  08/18/2012     Current Outpatient Medications on File Prior to Visit  Medication Sig Dispense Refill   Accu-Chek Softclix Lancets lancets Check blood sugar three times daily. Dx E11.42 100 each 12   atorvastatin (LIPITOR) 20 MG tablet Take 1 tablet (20 mg total) by mouth daily. 90 tablet 3   Blood Glucose Monitoring Suppl (ACCU-CHEK GUIDE ME) w/Device KIT Check blood sugar three times daily. Dx E11.42 1 kit 0   diclofenac Sodium (VOLTAREN) 1 % GEL Apply 2 g topically 4 (four) times daily. 100 g 3   empagliflozin (JARDIANCE) 25 MG TABS tablet Take 1 tablet (25 mg total) by mouth daily before breakfast. 90 tablet 3   fluticasone (FLONASE) 50 MCG/ACT nasal spray Place 2 sprays into both nostrils daily. 16 g 6   gabapentin (NEURONTIN) 300 MG capsule TAKE ONE CAPSULE BY MOUTH TWICE DAILY 180 capsule 2   glucose blood (ACCU-CHEK GUIDE) test strip Check blood sugar three times daily. Dx E11.42 100 each 2   insulin glargine (LANTUS SOLOSTAR) 100 UNIT/ML Solostar Pen Inject 14 Units into the skin daily. 15 mL 6   Insulin Pen Needle 32G X 4 MM MISC 1 Device by Does not apply route daily in the afternoon. 100 each 3   levothyroxine (SYNTHROID) 50 MCG tablet Take 1 tablet (50 mcg total) by mouth daily. 30 tablet 6   metFORMIN (GLUCOPHAGE) 1000 MG tablet Take 1 tablet (1,000 mg total) by mouth 2 (two) times daily. 180 tablet 3   [DISCONTINUED] venlafaxine (EFFEXOR) 37.5 MG tablet Take 1 tablet (37.5 mg total) by mouth 2 (two) times daily with a meal. 60 tablet 11   No current facility-administered medications on file prior to visit.    Allergies  Allergen Reactions   Elavil [Amitriptyline Hcl] Diarrhea and Nausea And Vomiting    Social History   Socioeconomic History   Marital status: Married    Spouse name: Not on file   Number of children: Not on file   Years of education: Not on file   Highest education level: Not on file  Occupational History   Not on file  Tobacco Use   Smoking status:  Former   Smokeless tobacco: Never  Vaping Use   Vaping Use: Never used  Substance and Sexual Activity   Alcohol use: No   Drug use: No   Sexual activity: Not on file  Other Topics Concern   Not on file  Social History Narrative   Not on file   Social Determinants of Health   Financial Resource Strain: Not on file  Food Insecurity: Not on file  Transportation Needs: Not on file  Physical Activity: Not on file  Stress: Not on file  Social Connections: Not on file  Intimate Partner  Violence: Not on file    Family History  Problem Relation Age of Onset   Migraines Mother    Esophageal cancer Other    Breast cancer Neg Hx    Colon cancer Neg Hx    Rectal cancer Neg Hx    Stomach cancer Neg Hx     Past Surgical History:  Procedure Laterality Date   no previous surgeries      ROS: Review of Systems Negative except as stated above  PHYSICAL EXAM: BP 132/76   Pulse 60   Resp 16   Wt 148 lb 9.6 oz (67.4 kg)   SpO2 98%   BMI 30.01 kg/m   Physical Exam  General appearance - alert, well appearing, and in no distress Mental status - normal mood, behavior, speech, dress, motor activity, and thought processes Neck - supple, no significant adenopathy Chest - clear to auscultation, no wheezes, rales or rhonchi, symmetric air entry Heart - normal rate, regular rhythm, normal S1, S2, no murmurs, rubs, clicks or gallops Neurological - cranial nerves II through XII intact, motor and sensory grossly normal bilaterally Extremities - peripheral pulses normal, no pedal edema, no clubbing or cyanosis   CMP Latest Ref Rng & Units 05/09/2020 10/03/2019 11/13/2018  Glucose 65 - 99 mg/dL 302(H) 123(H) 154(H)  BUN 6 - 24 mg/dL _0 Creatinine 0.57 - 1.00 mg/dL 0.82 0.57 0.70  Sodium 134 - 144 mmol/L 138 141 140  Potassium 3.5 - 5.2 mmol/L 4.7 4.3 4.5  Chloride 96 - 106 mmol/L 101 105 102  CO2 20 - 29 mmol/L 19(L) 22 23  Calcium 8.7 - 10.2 mg/dL 9.5 9.3 9.9  Total Protein 6.0 -  8.5 g/dL 7.5 7.7 7.9  Total Bilirubin 0.0 - 1.2 mg/dL 0.4 0.3 0.6  Alkaline Phos 44 - 121 IU/L 145(H) 135(H) 181(H)  AST 0 - 40 IU/L 30 47(H) 31  ALT 0 - 32 IU/L 35(H) 57(H) 43(H)   Lipid Panel     Component Value Date/Time   CHOL 210 (H) 09/26/2020 1104   TRIG 261 (H) 09/26/2020 1104   HDL 56 09/26/2020 1104   CHOLHDL 3.8 09/26/2020 1104   CHOLHDL 3.3 10/20/2015 1625   VLDL 46 (H) 10/20/2015 1625   LDLCALC 109 (H) 09/26/2020 1104    CBC    Component Value Date/Time   WBC 7.5 01/03/2020 1616   WBC 9.7 10/10/2017 1941   RBC 5.01 01/03/2020 1616   RBC 4.81 10/10/2017 1941   HGB 13.1 01/03/2020 1616   HCT 40.6 01/03/2020 1616   PLT 259 01/03/2020 1616   MCV 81 01/03/2020 1616   MCH 26.1 (L) 01/03/2020 1616   MCH 26.4 10/10/2017 1941   MCHC 32.3 01/03/2020 1616   MCHC 31.0 10/10/2017 1941   RDW 13.1 01/03/2020 1616   LYMPHSABS 2,948 10/20/2015 1625   MONOABS 335 10/20/2015 1625   EOSABS 268 10/20/2015 1625   BASOSABS 0 10/20/2015 1625    ASSESSMENT AND PLAN:  1. Type 2 diabetes mellitus with peripheral neuropathy (HCC) -pt reports some hypoglycemic episodes in the evenings post Lantus 14 units. She stopped the med because of this.  I recommend restart by at 11 units daily.  If Hypoglycemic episodes persist frequently, She can decrease further.   -stress importance of staying hydrated on Jardiance Keep f/u with Dr. Kelton Pillar 01/2021.  Request that she keeps log of BS readings and take it with her when she sees specialist. - Comprehensive metabolic panel  2. Essential hypertension Close  to goal.  D/c Lisinopril as it may be causing/contributing the dry cough.  Change to Diovan - amLODipine (NORVASC) 10 MG tablet; Take 1 tablet (10 mg total) by mouth daily.  Dispense: 30 tablet; Refill: 11  3. Dry cough See #2 above. May also have component of post-nasal drips.  Recommend OTC Claritin and Flonase nasal spray  4. Migraine without aura and without status migrainosus,  not intractable RF Imitrex Add Low dose Topamax for prophylaxis - SUMAtriptan (IMITREX) 50 MG tablet; Take 1 tab at the start of the headache.  May repeat in 2 hours if headache does not completely resolve.  Max 2 tabs/24 hr.  Dispense: 10 tablet; Refill: 2 - topiramate (TOPAMAX) 25 MG tablet; Take 1 tablet (25 mg total) by mouth at bedtime.  Dispense: 30 tablet; Refill: 3  5. Primary osteoarthritis of both knees RF Mobic  6. Hyperlipidemia associated with type 2 diabetes mellitus (HCC) Continue Lipitor  7. Acquired hypothyroidism Last TSH nl. Continue current dose of Levothyroxine  8. Need for shingles vaccine Given 1st Shingrix vaccine  9. Encounter for screening mammogram for malignant neoplasm of breast - MM Digital Screening; Future   Patient was given the opportunity to ask questions.  Patient verbalized understanding of the plan and was able to repeat key elements of the plan.   Orders Placed This Encounter  Procedures   MM Digital Screening   Varicella-zoster vaccine IM   Comprehensive metabolic panel     Requested Prescriptions   Signed Prescriptions Disp Refills   amLODipine (NORVASC) 10 MG tablet 30 tablet 11    Sig: Take 1 tablet (10 mg total) by mouth daily.   meloxicam (MOBIC) 15 MG tablet 30 tablet 6    Sig: Take 1 tablet (15 mg total) by mouth daily. For arthritis pain   valsartan (DIOVAN) 40 MG tablet 45 tablet 3    Sig: Take 0.5 tablets (20 mg total) by mouth daily. Stop Lisinopril   SUMAtriptan (IMITREX) 50 MG tablet 10 tablet 2    Sig: Take 1 tab at the start of the headache.  May repeat in 2 hours if headache does not completely resolve.  Max 2 tabs/24 hr.   topiramate (TOPAMAX) 25 MG tablet 30 tablet 3    Sig: Take 1 tablet (25 mg total) by mouth at bedtime.    Return in about 4 months (around 04/21/2021).  Karle Plumber, MD, FACP

## 2020-12-23 DIAGNOSIS — G43109 Migraine with aura, not intractable, without status migrainosus: Secondary | ICD-10-CM | POA: Diagnosis not present

## 2020-12-23 LAB — COMPREHENSIVE METABOLIC PANEL
ALT: 32 IU/L (ref 0–32)
AST: 27 IU/L (ref 0–40)
Albumin/Globulin Ratio: 1.8 (ref 1.2–2.2)
Albumin: 4.9 g/dL (ref 3.8–4.9)
Alkaline Phosphatase: 126 IU/L — ABNORMAL HIGH (ref 44–121)
BUN/Creatinine Ratio: 15 (ref 9–23)
BUN: 10 mg/dL (ref 6–24)
Bilirubin Total: 0.5 mg/dL (ref 0.0–1.2)
CO2: 22 mmol/L (ref 20–29)
Calcium: 9.6 mg/dL (ref 8.7–10.2)
Chloride: 106 mmol/L (ref 96–106)
Creatinine, Ser: 0.66 mg/dL (ref 0.57–1.00)
Globulin, Total: 2.8 g/dL (ref 1.5–4.5)
Glucose: 90 mg/dL (ref 70–99)
Potassium: 4.5 mmol/L (ref 3.5–5.2)
Sodium: 144 mmol/L (ref 134–144)
Total Protein: 7.7 g/dL (ref 6.0–8.5)
eGFR: 102 mL/min/{1.73_m2} (ref >59)

## 2020-12-24 DIAGNOSIS — G43109 Migraine with aura, not intractable, without status migrainosus: Secondary | ICD-10-CM | POA: Diagnosis not present

## 2020-12-25 DIAGNOSIS — G43109 Migraine with aura, not intractable, without status migrainosus: Secondary | ICD-10-CM | POA: Diagnosis not present

## 2020-12-26 DIAGNOSIS — G43109 Migraine with aura, not intractable, without status migrainosus: Secondary | ICD-10-CM | POA: Diagnosis not present

## 2020-12-27 DIAGNOSIS — G43109 Migraine with aura, not intractable, without status migrainosus: Secondary | ICD-10-CM | POA: Diagnosis not present

## 2020-12-28 DIAGNOSIS — G43109 Migraine with aura, not intractable, without status migrainosus: Secondary | ICD-10-CM | POA: Diagnosis not present

## 2020-12-29 DIAGNOSIS — G43109 Migraine with aura, not intractable, without status migrainosus: Secondary | ICD-10-CM | POA: Diagnosis not present

## 2020-12-30 DIAGNOSIS — G43109 Migraine with aura, not intractable, without status migrainosus: Secondary | ICD-10-CM | POA: Diagnosis not present

## 2020-12-31 DIAGNOSIS — G43109 Migraine with aura, not intractable, without status migrainosus: Secondary | ICD-10-CM | POA: Diagnosis not present

## 2021-01-01 ENCOUNTER — Telehealth: Payer: Self-pay

## 2021-01-01 ENCOUNTER — Other Ambulatory Visit: Payer: Self-pay

## 2021-01-01 ENCOUNTER — Encounter: Payer: Self-pay | Admitting: Dietician

## 2021-01-01 ENCOUNTER — Encounter: Payer: Medicaid Other | Attending: Internal Medicine | Admitting: Dietician

## 2021-01-01 DIAGNOSIS — G43109 Migraine with aura, not intractable, without status migrainosus: Secondary | ICD-10-CM | POA: Diagnosis not present

## 2021-01-01 DIAGNOSIS — E1142 Type 2 diabetes mellitus with diabetic polyneuropathy: Secondary | ICD-10-CM | POA: Insufficient documentation

## 2021-01-01 NOTE — Patient Instructions (Signed)
Continue to take your medications as prescribed.  Treat low blood sugar (less than 70) with 1/2 cup juice.   Increase exercise to 30 minutes most days.  Walk quickly if running is too difficult.  Continue to eat plenty of vegetables. Continue to choose whole grains.  Watch portion size. Choose lentils or beans with each lunch and dinner.  Continue to drink plenty of water and avoid sweetened beverages. Limit added salt. Use only small amounts of oil in cooking.  Eat until you are satisfied not overfull.

## 2021-01-01 NOTE — Progress Notes (Signed)
Diabetes Self-Management Education  Visit Type: First/Initial  Appt. Start Time: 1035 Appt. End Time: 1150  01/01/2021  [name], identified by name and date of birth, is a 60 y.o. female with a diagnosis of Diabetes: Type 2.   ASSESSMENT Patient is here today with her daughter-in-law and a [facility] Interpret[facility] [place].  She speaks [place].  Her daughter-in-law speaks both [place] and [place]. She complains of continued dizziness with insulin and does not like to take this but reports taking it daily.  Dizziness worse when she takes in the am so currently taking at HS.  Blood glucose 60 recently and treated but otherwise above 80 per patient.    History includes Type 2 Diabetes (>5 years), HTN, HLD, vitamin D deficiency Medications include:  Jardiance, Metformin, Lantus 11 units q HS, synthroid Labs noted:  Choldesterol 219, HDL 56, LDL 109, Triglycerides 261 09/26/2020, A1C 10% 10/17/2020 decreased from 11.3% 8/26//2022, GFR 103 10/2019.  Patient lives with husband, 3 children and 2 grandchildren. She is from [place] and has been in the [name] since 2011. She is unable to read.  Runs on the treadmill at her home 3-4 times per week. She does not eat chicken or other meats or eggs and does eat dairy, occasional fish. Uses small amounts of salt.  Avoids sweets.  Chooses whole grains and states that she tries to be mindful of portions.  Height 5\' 4"  (1.626 m), weight 148 lb (67.1 kg). Body mass index is 25.4 kg/m.   Diabetes Self-Management Education - 01/01/21 1054       Visit Information   Visit Type First/Initial      Initial Visit   Diabetes Type Type 2    Are you currently following a meal plan? No    Are you taking your medications as prescribed? Yes    Date Diagnosed 2016      Health Coping   How would you rate your overall health? Good      Psychosocial Assessment   Patient Belief/Attitude about Diabetes Other (comment)   unsure   Self-care barriers Low  literacy;English as a second language    Self-management support Doctor's office;Family    Other persons present Patient;Family Member    Patient Concerns Nutrition/Meal planning    Special Needs None    Preferred Learning Style No preference indicated    Learning Readiness Ready    How often do you need to have someone help you when you read instructions, pamphlets, or other written materials from your doctor or pharmacy? 5 - Always    What is the last grade level you completed in school? no education      Pre-Education Assessment   Patient understands the diabetes disease and treatment process. Needs Instruction    Patient understands incorporating nutritional management into lifestyle. Needs Instruction    Patient undertands incorporating physical activity into lifestyle. Needs Instruction    Patient understands using medications safely. Needs Instruction    Patient understands monitoring blood glucose, interpreting and using results Needs Instruction    Patient understands prevention, detection, and treatment of acute complications. Needs Instruction    Patient understands prevention, detection, and treatment of chronic complications. Needs Instruction    Patient understands how to develop strategies to address psychosocial issues. Needs Instruction    Patient understands how to develop strategies to promote health/change behavior. Needs Instruction      Complications   Last HgB A1C per patient/outside source 10 %   10/17/2020 decreased from 11.3%  09/26/2020   How often do you check your blood sugar? 3-4 times/day    Fasting Blood glucose range (mg/dL) 70-129    Postprandial Blood glucose range (mg/dL) 70-129    Number of hypoglycemic episodes per month 1    Can you tell when your blood sugar is low? Yes    What do you do if your blood sugar is low? drink juice or eat fruit    Number of hyperglycemic episodes per week 0    Have you had a dilated eye exam in the past 12 months? Yes     Have you had a dental exam in the past 12 months? No   false teeth   Are you checking your feet? No      Dietary Intake   Breakfast bread or oatmeal or quinoa, sometimes fruit    Snack (morning) none    Lunch brown rice, vegetables,lentils/beans    Snack (afternoon) none    Dinner bread (buckwheat), vegables, legumes, occasional corn "rice" (rice like product made from corn)    Snack (evening) none    Beverage(s) water, juice to treat a low blood sugar, occasional unsweetened tea (occasionally with honey) b      Exercise   Exercise Type Light (walking / raking leaves)   runs on the treadmill every day that she feels good   How many days per week to you exercise? 4    How many minutes per day do you exercise? 10    Total minutes per week of exercise 40      Patient Education   Previous Diabetes Education No    Nutrition management  Role of diet in the treatment of diabetes and the relationship between the three main macronutrients and blood glucose level;Meal options for control of blood glucose level and chronic complications.    Physical activity and exercise  Role of exercise on diabetes management, blood pressure control and cardiac health.    Medications Reviewed patients medication for diabetes, action, purpose, timing of dose and side effects.    Monitoring Identified appropriate SMBG and/or A1C goals.;Daily foot exams    Acute complications Taught treatment of hypoglycemia - the 15 rule.    Psychosocial adjustment Role of stress on diabetes;Helped patient identify a support system for diabetes management;Identified and addressed patients feelings and concerns about diabetes      Individualized Goals (developed by patient)   Nutrition General guidelines for healthy choices and portions discussed    Physical Activity Exercise 5-7 days per week;30 minutes per day    Medications take my medication as prescribed    Monitoring  test my blood glucose as discussed    Health Coping  discuss diabetes with (comment)   MD, RD, CDCES     Post-Education Assessment   Patient understands the diabetes disease and treatment process. Needs Review    Patient understands incorporating nutritional management into lifestyle. Needs Review    Patient undertands incorporating physical activity into lifestyle. Demonstrates understanding / competency    Patient understands using medications safely. Needs Review    Patient understands monitoring blood glucose, interpreting and using results Demonstrates understanding / competency    Patient understands prevention, detection, and treatment of acute complications. Demonstrates understanding / competency    Patient understands prevention, detection, and treatment of chronic complications. Needs Review    Patient understands how to develop strategies to address psychosocial issues. Demonstrates understanding / competency    Patient understands how to develop strategies to promote health/change behavior.  Needs Review      Outcomes   Expected Outcomes Demonstrated interest in learning. Expect positive outcomes    Future DMSE 2 months    Program Status Not Completed             Individualized Plan for Diabetes Self-Management Training:   Learning Objective:  Patient will have a greater understanding of diabetes self-management. Patient education plan is to attend individual and/or group sessions per assessed needs and concerns.   Plan:   Patient Instructions  Continue to take your medications as prescribed.  Treat low blood sugar (less than 70) with 1/2 cup juice.   Increase exercise to 30 minutes most days.  Walk quickly if running is too difficult.  Continue to eat plenty of vegetables. Continue to choose whole grains.  Watch portion size. Choose lentils or beans with each lunch and dinner.  Continue to drink plenty of water and avoid sweetened beverages. Limit added salt. Use only small amounts of oil in cooking.  Eat  until you are satisfied not overfull.    Expected Outcomes:  Demonstrated interest in learning. Expect positive outcomes  Education material provided: ADA - How to Thrive: A Guide for Your Journey with Diabetes and Meal plan card  If problems or questions, patient to contact team via:  Phone  Future DSME appointment: 2 months

## 2021-01-01 NOTE — Telephone Encounter (Signed)
Call placed to Havery Moros, RN [facility] # [phone] to clarify what is needed for prior approval for PCS. Message left with call back requested to this CM

## 2021-01-02 DIAGNOSIS — G43109 Migraine with aura, not intractable, without status migrainosus: Secondary | ICD-10-CM | POA: Diagnosis not present

## 2021-01-02 NOTE — Telephone Encounter (Signed)
April Mcmahon with [employer] [facility] called back, just stated needs what's requested on the form. Please call back

## 2021-01-03 DIAGNOSIS — G43109 Migraine with aura, not intractable, without status migrainosus: Secondary | ICD-10-CM | POA: Diagnosis not present

## 2021-01-04 DIAGNOSIS — G43109 Migraine with aura, not intractable, without status migrainosus: Secondary | ICD-10-CM | POA: Diagnosis not present

## 2021-01-05 DIAGNOSIS — G43109 Migraine with aura, not intractable, without status migrainosus: Secondary | ICD-10-CM | POA: Diagnosis not present

## 2021-01-05 NOTE — Telephone Encounter (Signed)
See encounter

## 2021-01-06 DIAGNOSIS — G43109 Migraine with aura, not intractable, without status migrainosus: Secondary | ICD-10-CM | POA: Diagnosis not present

## 2021-01-06 NOTE — Telephone Encounter (Signed)
April Mcmahon with [employer] called April Mcmahon back and asked her to return the call.  No one answered when I tried to transfer to the office.

## 2021-01-06 NOTE — Telephone Encounter (Signed)
Call returned to April Mcmahon, [name], message left with call back requested to this CM

## 2021-01-07 DIAGNOSIS — G43109 Migraine with aura, not intractable, without status migrainosus: Secondary | ICD-10-CM | POA: Diagnosis not present

## 2021-01-07 NOTE — Telephone Encounter (Signed)
Call received from [facility].  She explained that the request for prior authorization form is to re-certify the patient's PCS.  The form that was faxed to Dr Wynetta Emery can be completed or a 3051 can be submitted. She said that the reason for services can state that the member needs assistance with ADLs/IADLs due to medical conditions.  There does not need to be any additional documentation.  The Prior Authorization  form was completed for Dr Wynetta Emery to sign.

## 2021-01-08 ENCOUNTER — Telehealth: Payer: Self-pay

## 2021-01-08 DIAGNOSIS — G43109 Migraine with aura, not intractable, without status migrainosus: Secondary | ICD-10-CM | POA: Diagnosis not present

## 2021-01-08 NOTE — Telephone Encounter (Signed)
Completed [place] re-certification prior authorization form faxed to [employer].

## 2021-01-09 DIAGNOSIS — G43109 Migraine with aura, not intractable, without status migrainosus: Secondary | ICD-10-CM | POA: Diagnosis not present

## 2021-01-10 DIAGNOSIS — G43109 Migraine with aura, not intractable, without status migrainosus: Secondary | ICD-10-CM | POA: Diagnosis not present

## 2021-01-11 DIAGNOSIS — G43109 Migraine with aura, not intractable, without status migrainosus: Secondary | ICD-10-CM | POA: Diagnosis not present

## 2021-01-12 DIAGNOSIS — G43109 Migraine with aura, not intractable, without status migrainosus: Secondary | ICD-10-CM | POA: Diagnosis not present

## 2021-01-13 DIAGNOSIS — G43109 Migraine with aura, not intractable, without status migrainosus: Secondary | ICD-10-CM | POA: Diagnosis not present

## 2021-01-14 DIAGNOSIS — G43109 Migraine with aura, not intractable, without status migrainosus: Secondary | ICD-10-CM | POA: Diagnosis not present

## 2021-01-15 DIAGNOSIS — G43109 Migraine with aura, not intractable, without status migrainosus: Secondary | ICD-10-CM | POA: Diagnosis not present

## 2021-01-16 ENCOUNTER — Encounter: Payer: Self-pay | Admitting: Internal Medicine

## 2021-01-16 ENCOUNTER — Ambulatory Visit (INDEPENDENT_AMBULATORY_CARE_PROVIDER_SITE_OTHER): Payer: Medicaid Other | Admitting: Internal Medicine

## 2021-01-16 ENCOUNTER — Other Ambulatory Visit: Payer: Self-pay

## 2021-01-16 VITALS — BP 130/70 | HR 70 | Ht 64.0 in | Wt 150.2 lb

## 2021-01-16 DIAGNOSIS — E785 Hyperlipidemia, unspecified: Secondary | ICD-10-CM

## 2021-01-16 DIAGNOSIS — E1142 Type 2 diabetes mellitus with diabetic polyneuropathy: Secondary | ICD-10-CM | POA: Diagnosis not present

## 2021-01-16 DIAGNOSIS — G43109 Migraine with aura, not intractable, without status migrainosus: Secondary | ICD-10-CM | POA: Diagnosis not present

## 2021-01-16 LAB — POCT GLYCOSYLATED HEMOGLOBIN (HGB A1C): Hemoglobin A1C: 7 % — AB (ref 4.0–5.6)

## 2021-01-16 NOTE — Patient Instructions (Addendum)
°-   Continue Jardiance 25 mg daily  - Continue Metformin 1000 mg , twice a day  - Continue Lantus 12 units once daily  - Continue Atorvastatin to 20 mg daily ( Cholesterol )     HOW TO TREAT LOW BLOOD SUGARS (Blood sugar LESS THAN 70 MG/DL) Please follow the RULE OF 15 for the treatment of hypoglycemia treatment (when your (blood sugars are less than 70 mg/dL)   STEP 1: Take 15 grams of carbohydrates when your blood sugar is low, which includes:  3-4 GLUCOSE TABS  OR 3-4 OZ OF JUICE OR REGULAR SODA OR ONE TUBE OF GLUCOSE GEL    STEP 2: RECHECK blood sugar in 15 MINUTES STEP 3: If your blood sugar is still low at the 15 minute recheck --> then, go back to STEP 1 and treat AGAIN with another 15 grams of carbohydrates.

## 2021-01-16 NOTE — Progress Notes (Signed)
Name: April Mcmahon  MRN/ DOB: [id number], 1960-06-15   Age/ Sex: 60 y.o., female    PCP: Ladell Pier, MD   Reason for Endocrinology Evaluation: Type 2 Diabetes Mellitus     Date of Initial Endocrinology Visit: 01/16/2021     PATIENT IDENTIFIER: April Mcmahon is a 60 y.o. female with a past medical history of T2DM, HTN and Dyslipidemia . The patient presented for initial endocrinology clinic visit on 01/16/2021 for consultative assistance with her diabetes management.    HPI: April Mcmahon is accompanied by her daughter in-law who is interpreting today    Diagnosed with DM >6 yrs ago  Prior Medications tried/Intolerance: unknown  Currently checking blood sugars 3 x / day,  before breakfast and after lunch and dinner  Hypoglycemia episodes : initially denies but subsequently she endorsed 20 's            Hemoglobin A1c has ranged from 6.3% in 2018, peaking at 10.3% in 2022. Patient required assistance for hypoglycemia: no  Patient has required hospitalization within the last 1 year from hyper or hypoglycemia: no  In terms of diet, the patient eats 3 meals a day, occasional snacks, avoids sugar- sweetened beverages      DIABETIC HISTORY:  April Mcmahon  was diagnosed with DM yrs ago. Her  hemoglobin A1c has ranged from 6.3% in 2018, peaking at 10.3% in 2022.  SUBJECTIVE:   During the last visit (10/17/2020): A1c 11.3 % Stopped glipzide, increased insulin and Jardiance   Today (01/16/21): April Mcmahon is here for a follow up on diabetes management.   She checks her  blood sugars 2 times daily. The patient has not had hypoglycemic episodes since the last clinic visit.  Per daughter in law she was asked by her PCP to reduce insulin from 14 units to 12 units for dizziness.  She stays hydrated  Dizziness has improved      HOME Endocrine  REGIMEN: Jardiance 25 mg daily  Lantus 14 units daily  Metformin 1000 mg BID Atorvastatin 20 mg daily    Statin: yes ACE-I/ARB:  yes Prior Diabetic Education: no   METER DOWNLOAD SUMMARY: 12/2-12/16/2022  Average Number Tests/Day = 2.1 Overall Mean FS Glucose = 137 Standard Deviation = 38  BG Ranges: Low = 91 High = 228   Hypoglycemic Events/30 Days: BG < 50 = 0 Episodes of symptomatic severe hypoglycemia = 0       DIABETIC COMPLICATIONS: Microvascular complications:  Neuropathy  Denies: CKD, retinopathy  Last eye exam: Completed 2022  Macrovascular complications:   Denies: CAD, PVD, CVA   PAST HISTORY: Past Medical History:  Past Medical History:  Diagnosis Date   Bilateral chronic knee pain 06/2014   Chronic low back pain 06/2014   Chronic migraine 02/1985   Depression    hx of   Diabetes mellitus without complication ([place]) 46/5035   Fall    Hyperlipidemia    Hypertension 09/2013   Shoulder pain    Past Surgical History:  Past Surgical History:  Procedure Laterality Date   no previous surgeries      Social History:  reports that she has quit smoking. She has never used smokeless tobacco. She reports that she does not drink alcohol and does not use drugs. Family History:  Family History  Problem Relation Age of Onset   Migraines Mother    Esophageal cancer Other    Breast cancer Neg Hx    Colon cancer Neg Hx  Rectal cancer Neg Hx    Stomach cancer Neg Hx      HOME MEDICATIONS: Allergies as of 01/16/2021       Reactions   Elavil [amitriptyline Hcl] Diarrhea, Nausea And Vomiting        Medication List        Accurate as of January 16, 2021  9:35 AM. If you have any questions, ask your nurse or doctor.          Accu-Chek Guide Me w/Device Kit Check blood sugar three times daily. Dx E11.42   Accu-Chek Guide test strip Generic drug: glucose blood Check blood sugar three times daily. Dx E11.42   Accu-Chek Softclix Lancets lancets Check blood sugar three times daily. Dx E11.42   amLODipine 10 MG tablet Commonly known as: NORVASC Take 1 tablet (10  mg total) by mouth daily.   atorvastatin 20 MG tablet Commonly known as: LIPITOR Take 1 tablet (20 mg total) by mouth daily.   diclofenac Sodium 1 % Gel Commonly known as: Voltaren Apply 2 g topically 4 (four) times daily.   empagliflozin 25 MG Tabs tablet Commonly known as: Jardiance Take 1 tablet (25 mg total) by mouth daily before breakfast.   fluticasone 50 MCG/ACT nasal spray Commonly known as: FLONASE Place 2 sprays into both nostrils daily.   gabapentin 300 MG capsule Commonly known as: NEURONTIN TAKE ONE CAPSULE BY MOUTH TWICE DAILY   Insulin Pen Needle 32G X 4 MM Misc 1 Device by Does not apply route daily in the afternoon.   Lantus SoloStar 100 UNIT/ML Solostar Pen Generic drug: insulin glargine Inject 14 Units into the skin daily.   levothyroxine 50 MCG tablet Commonly known as: SYNTHROID Take 1 tablet (50 mcg total) by mouth daily.   meloxicam 15 MG tablet Commonly known as: MOBIC Take 1 tablet (15 mg total) by mouth daily. For arthritis pain   metFORMIN 1000 MG tablet Commonly known as: GLUCOPHAGE Take 1 tablet (1,000 mg total) by mouth 2 (two) times daily.   SUMAtriptan 50 MG tablet Commonly known as: Imitrex Take 1 tab at the start of the headache.  May repeat in 2 hours if headache does not completely resolve.  Max 2 tabs/24 hr.   topiramate 25 MG tablet Commonly known as: Topamax Take 1 tablet (25 mg total) by mouth at bedtime.   valsartan 40 MG tablet Commonly known as: DIOVAN Take 0.5 tablets (20 mg total) by mouth daily. Stop Lisinopril         ALLERGIES: Allergies  Allergen Reactions   Elavil [Amitriptyline Hcl] Diarrhea and Nausea And Vomiting     REVIEW OF SYSTEMS: A comprehensive ROS was conducted with the patient and is negative except as per HPI and below:  Review of Systems  Neurological:  Positive for tingling.     OBJECTIVE:   VITAL SIGNS: BP 130/70 (BP Location: Left Arm, Patient Position: Sitting, Cuff Size:  Small)    Pulse 70    Ht 5' 4"  (1.626 m)    Wt 150 lb 3.2 oz (68.1 kg)    SpO2 97%    BMI 25.78 kg/m    PHYSICAL EXAM:  General: Pt appears well and is in NAD  Lungs: Clear with good BS bilat with no rales, rhonchi, or wheezes  Heart: RRR with normal S1 and S2 and no gallops; no murmurs; no rub  Abdomen: Normoactive bowel sounds, soft, nontender, without masses or organomegaly palpable  Extremities:  Lower extremities - No pretibial edema. No lesions.  Neuro: MS is good with appropriate affect, pt is alert and Ox3    DM Foot Exam 01/16/2021  The skin of the feet is intact without sores or ulcerations. The pedal pulses are 2+ on right and 2+ on left. The sensation is intact to a screening 5.07, 10 gram monofilament bilaterally     DATA REVIEWED:  Lab Results  Component Value Date   HGBA1C 7.0 (A) 01/16/2021   HGBA1C 10.0 (A) 10/17/2020   HGBA1C 11.3 (A) 09/26/2020   Lab Results  Component Value Date   MICROALBUR 3.8 01/20/2016   LDLCALC 109 (H) 09/26/2020   CREATININE 0.66 12/22/2020   Lab Results  Component Value Date   MICRALBCREAT 161 (H) 11/13/2018    Lab Results  Component Value Date   CHOL 210 (H) 09/26/2020   HDL 56 09/26/2020   LDLCALC 109 (H) 09/26/2020   TRIG 261 (H) 09/26/2020   CHOLHDL 3.8 09/26/2020        ASSESSMENT / PLAN / RECOMMENDATIONS:   1) Type 2 Diabetes Mellitus, optimally controlled, With neuropathic  complications - Most recent A1c of 7.0  %. Goal A1c < 7.0 %.     - A1 down from 11.3%  - Praised the pt  on improved glycemic control - No changes at this time  - Pt with language barrier    MEDICATIONS:  -Continue Jardiance to 25 mg daily  - Continue Metformin 1000 mg , twice a day  -Continue Lantus 12 units daily   EDUCATION / INSTRUCTIONS: BG monitoring instructions: Patient is instructed to check her blood sugars 3 times a day, before meals . Call [place] [facility] clinic if: BG persistently < 70  I reviewed the Rule  of 15 for the treatment of hypoglycemia in detail with the patient. Literature supplied.   2) Diabetic complications:  Eye: Does not have known diabetic retinopathy.  Neuro/ Feet: Does  have known diabetic peripheral neuropathy. Renal: Patient does not have known baseline CKD. She is  on an ACEI/ARB at present.     3) Dyslipidemia:  -We had increased her atorvastatin due to LDL above goal at 109 mg/DL in 09/2020 -Tolerating well   Medication   Continue atorvastatin 20 mg daily   F/U in 3 months     Signed electronically by: Mack Guise, MD  [facility]  [facility] [address], [place] [address] Phone: [phone] FAX: [phone]   CC: Ladell Pier, MD [place] [address] Phone: [phone]  Fax: [phone]    Return to Endocrinology clinic as below: Future Appointments  Date Time Provider [place]  01/30/2021  8:10 AM [facility] MM 3 [facility]  03/05/2021 10:30 AM Clydell Hakim, RD [place] NDM  04/21/2021  9:10 AM Ladell Pier, MD [facility] None

## 2021-01-17 DIAGNOSIS — G43109 Migraine with aura, not intractable, without status migrainosus: Secondary | ICD-10-CM | POA: Diagnosis not present

## 2021-01-18 DIAGNOSIS — G43109 Migraine with aura, not intractable, without status migrainosus: Secondary | ICD-10-CM | POA: Diagnosis not present

## 2021-01-19 ENCOUNTER — Telehealth: Payer: Self-pay | Admitting: Internal Medicine

## 2021-01-19 DIAGNOSIS — G43109 Migraine with aura, not intractable, without status migrainosus: Secondary | ICD-10-CM | POA: Diagnosis not present

## 2021-01-19 NOTE — Telephone Encounter (Signed)
Form has been located and will be placed in provider folder

## 2021-01-19 NOTE — Telephone Encounter (Signed)
Pts daughter brought in paperwork that needs to be filled out by her PCP, placed paperwork in Dr Bard Herbert box

## 2021-01-20 DIAGNOSIS — G43109 Migraine with aura, not intractable, without status migrainosus: Secondary | ICD-10-CM | POA: Diagnosis not present

## 2021-01-21 DIAGNOSIS — G43109 Migraine with aura, not intractable, without status migrainosus: Secondary | ICD-10-CM | POA: Diagnosis not present

## 2021-01-22 DIAGNOSIS — G43109 Migraine with aura, not intractable, without status migrainosus: Secondary | ICD-10-CM | POA: Diagnosis not present

## 2021-01-22 NOTE — Telephone Encounter (Signed)
Contacted pt daughter and made aware that form is ready for pick. Pt daughter doesn't have any questions or concerns

## 2021-01-23 DIAGNOSIS — G43109 Migraine with aura, not intractable, without status migrainosus: Secondary | ICD-10-CM | POA: Diagnosis not present

## 2021-01-24 DIAGNOSIS — G43109 Migraine with aura, not intractable, without status migrainosus: Secondary | ICD-10-CM | POA: Diagnosis not present

## 2021-01-25 DIAGNOSIS — G43109 Migraine with aura, not intractable, without status migrainosus: Secondary | ICD-10-CM | POA: Diagnosis not present

## 2021-01-26 DIAGNOSIS — G43109 Migraine with aura, not intractable, without status migrainosus: Secondary | ICD-10-CM | POA: Diagnosis not present

## 2021-01-27 DIAGNOSIS — G43109 Migraine with aura, not intractable, without status migrainosus: Secondary | ICD-10-CM | POA: Diagnosis not present

## 2021-01-28 DIAGNOSIS — G43109 Migraine with aura, not intractable, without status migrainosus: Secondary | ICD-10-CM | POA: Diagnosis not present

## 2021-01-29 DIAGNOSIS — G43109 Migraine with aura, not intractable, without status migrainosus: Secondary | ICD-10-CM | POA: Diagnosis not present

## 2021-01-30 ENCOUNTER — Other Ambulatory Visit: Payer: Self-pay

## 2021-01-30 ENCOUNTER — Ambulatory Visit
Admission: RE | Admit: 2021-01-30 | Discharge: 2021-01-30 | Disposition: A | Discharge status: Home / Self Care | Payer: Medicaid Other | Source: Ambulatory Visit | Attending: Internal Medicine | Admitting: Internal Medicine

## 2021-01-30 DIAGNOSIS — G43109 Migraine with aura, not intractable, without status migrainosus: Secondary | ICD-10-CM | POA: Diagnosis not present

## 2021-01-30 DIAGNOSIS — Z1231 Encounter for screening mammogram for malignant neoplasm of breast: Secondary | ICD-10-CM

## 2021-01-31 DIAGNOSIS — G43109 Migraine with aura, not intractable, without status migrainosus: Secondary | ICD-10-CM | POA: Diagnosis not present

## 2021-02-01 DIAGNOSIS — G43109 Migraine with aura, not intractable, without status migrainosus: Secondary | ICD-10-CM | POA: Diagnosis not present

## 2021-02-02 DIAGNOSIS — G43109 Migraine with aura, not intractable, without status migrainosus: Secondary | ICD-10-CM | POA: Diagnosis not present

## 2021-02-03 DIAGNOSIS — G43109 Migraine with aura, not intractable, without status migrainosus: Secondary | ICD-10-CM | POA: Diagnosis not present

## 2021-02-04 DIAGNOSIS — G43109 Migraine with aura, not intractable, without status migrainosus: Secondary | ICD-10-CM | POA: Diagnosis not present

## 2021-02-05 DIAGNOSIS — G43109 Migraine with aura, not intractable, without status migrainosus: Secondary | ICD-10-CM | POA: Diagnosis not present

## 2021-02-06 DIAGNOSIS — G43109 Migraine with aura, not intractable, without status migrainosus: Secondary | ICD-10-CM | POA: Diagnosis not present

## 2021-02-07 DIAGNOSIS — G43109 Migraine with aura, not intractable, without status migrainosus: Secondary | ICD-10-CM | POA: Diagnosis not present

## 2021-02-08 DIAGNOSIS — G43109 Migraine with aura, not intractable, without status migrainosus: Secondary | ICD-10-CM | POA: Diagnosis not present

## 2021-02-09 DIAGNOSIS — G43109 Migraine with aura, not intractable, without status migrainosus: Secondary | ICD-10-CM | POA: Diagnosis not present

## 2021-02-10 DIAGNOSIS — G43109 Migraine with aura, not intractable, without status migrainosus: Secondary | ICD-10-CM | POA: Diagnosis not present

## 2021-02-11 DIAGNOSIS — G43109 Migraine with aura, not intractable, without status migrainosus: Secondary | ICD-10-CM | POA: Diagnosis not present

## 2021-02-12 DIAGNOSIS — G43109 Migraine with aura, not intractable, without status migrainosus: Secondary | ICD-10-CM | POA: Diagnosis not present

## 2021-02-13 DIAGNOSIS — G43109 Migraine with aura, not intractable, without status migrainosus: Secondary | ICD-10-CM | POA: Diagnosis not present

## 2021-02-14 DIAGNOSIS — G43109 Migraine with aura, not intractable, without status migrainosus: Secondary | ICD-10-CM | POA: Diagnosis not present

## 2021-02-15 DIAGNOSIS — G43109 Migraine with aura, not intractable, without status migrainosus: Secondary | ICD-10-CM | POA: Diagnosis not present

## 2021-02-16 DIAGNOSIS — G43109 Migraine with aura, not intractable, without status migrainosus: Secondary | ICD-10-CM | POA: Diagnosis not present

## 2021-02-17 DIAGNOSIS — G43109 Migraine with aura, not intractable, without status migrainosus: Secondary | ICD-10-CM | POA: Diagnosis not present

## 2021-02-18 DIAGNOSIS — G43109 Migraine with aura, not intractable, without status migrainosus: Secondary | ICD-10-CM | POA: Diagnosis not present

## 2021-02-19 DIAGNOSIS — G43109 Migraine with aura, not intractable, without status migrainosus: Secondary | ICD-10-CM | POA: Diagnosis not present

## 2021-02-20 DIAGNOSIS — G43109 Migraine with aura, not intractable, without status migrainosus: Secondary | ICD-10-CM | POA: Diagnosis not present

## 2021-02-21 DIAGNOSIS — G43109 Migraine with aura, not intractable, without status migrainosus: Secondary | ICD-10-CM | POA: Diagnosis not present

## 2021-02-22 DIAGNOSIS — G43109 Migraine with aura, not intractable, without status migrainosus: Secondary | ICD-10-CM | POA: Diagnosis not present

## 2021-02-23 DIAGNOSIS — G43109 Migraine with aura, not intractable, without status migrainosus: Secondary | ICD-10-CM | POA: Diagnosis not present

## 2021-02-24 DIAGNOSIS — G43109 Migraine with aura, not intractable, without status migrainosus: Secondary | ICD-10-CM | POA: Diagnosis not present

## 2021-02-25 DIAGNOSIS — G43109 Migraine with aura, not intractable, without status migrainosus: Secondary | ICD-10-CM | POA: Diagnosis not present

## 2021-02-26 DIAGNOSIS — G43109 Migraine with aura, not intractable, without status migrainosus: Secondary | ICD-10-CM | POA: Diagnosis not present

## 2021-02-27 ENCOUNTER — Other Ambulatory Visit: Payer: Self-pay | Admitting: Physician Assistant

## 2021-02-27 DIAGNOSIS — G43109 Migraine with aura, not intractable, without status migrainosus: Secondary | ICD-10-CM | POA: Diagnosis not present

## 2021-02-27 DIAGNOSIS — E039 Hypothyroidism, unspecified: Secondary | ICD-10-CM

## 2021-02-27 NOTE — Telephone Encounter (Signed)
Requested Prescriptions  Pending Prescriptions Disp Refills   levothyroxine (SYNTHROID) 50 MCG tablet [Pharmacy Med Name: LEVOTHYROXINE 50 MCG TABLET] 90 tablet 3    Sig: TAKE 1 TABLET BY MOUTH EVERY DAY     Endocrinology:  Hypothyroid Agents Failed - 02/27/2021  8:57 PM      Failed - TSH needs to be rechecked within 3 months after an abnormal result. Refill until TSH is due.      Passed - TSH in normal range and within 360 days    TSH  Date Value Ref Range Status  09/26/2020 2.490 0.450 - 4.500 uIU/mL Final         Passed - Valid encounter within last 12 months    Recent Outpatient Visits          2 months ago Type 2 diabetes mellitus with peripheral neuropathy [facility])   [place], Neoma Laming B, MD   4 months ago Uncontrolled type 2 diabetes mellitus with peripheral neuropathy [facility])   [place], Annie Main L, RPH-CPP   5 months ago Uncontrolled type 2 diabetes mellitus with peripheral neuropathy [facility])   [place] Karle Plumber B, MD   6 months ago Uncontrolled type 2 diabetes mellitus with peripheral neuropathy [facility])   [place], [place], [place]   9 months ago Upper respiratory tract infection, unspecified type   [place], Dionne [place], [place]      Future Appointments            In 1 month Wynetta Emery, Dalbert Batman, MD [place]

## 2021-02-28 DIAGNOSIS — G43109 Migraine with aura, not intractable, without status migrainosus: Secondary | ICD-10-CM | POA: Diagnosis not present

## 2021-03-01 DIAGNOSIS — G43109 Migraine with aura, not intractable, without status migrainosus: Secondary | ICD-10-CM | POA: Diagnosis not present

## 2021-03-02 DIAGNOSIS — G43109 Migraine with aura, not intractable, without status migrainosus: Secondary | ICD-10-CM | POA: Diagnosis not present

## 2021-03-03 DIAGNOSIS — G43109 Migraine with aura, not intractable, without status migrainosus: Secondary | ICD-10-CM | POA: Diagnosis not present

## 2021-03-04 DIAGNOSIS — G43109 Migraine with aura, not intractable, without status migrainosus: Secondary | ICD-10-CM | POA: Diagnosis not present

## 2021-03-05 ENCOUNTER — Other Ambulatory Visit: Payer: Self-pay

## 2021-03-05 ENCOUNTER — Encounter: Payer: Medicaid Other | Attending: Internal Medicine | Admitting: Dietician

## 2021-03-05 ENCOUNTER — Encounter: Payer: Self-pay | Admitting: Dietician

## 2021-03-05 DIAGNOSIS — G43109 Migraine with aura, not intractable, without status migrainosus: Secondary | ICD-10-CM | POA: Diagnosis not present

## 2021-03-05 DIAGNOSIS — E1142 Type 2 diabetes mellitus with diabetic polyneuropathy: Secondary | ICD-10-CM | POA: Insufficient documentation

## 2021-03-05 NOTE — Patient Instructions (Signed)
Patient Instructions  Continue to take your medications as prescribed.   Treat low blood sugar (less than 70) with 1/2 cup juice.    Increase exercise to 30 minutes most days.  Walk quickly if running is too difficult.  (10 minutes after each meal would be fine)   Continue to eat plenty of vegetables. Continue to choose whole grains.  Watch portion size. Choose lentils or beans with each lunch and dinner.   Continue to drink plenty of water and avoid sweetened beverages. Limit added salt. Use only small amounts of oil in cooking.   Eat until you are satisfied not overfull.

## 2021-03-05 NOTE — Progress Notes (Signed)
Diabetes Self-Management Education  Visit Type: Follow-up  Appt. Start Time: 1030 Appt. End Time: 1100  03/06/2021  [name], identified by name and date of birth, is a 61 y.o. female with a diagnosis of Diabetes:  .   ASSESSMENT Patient is here today with a [facility] Interpretor Subhadra. Patient states that she still complains of dizziness and fell yesterday in her kitchen without injury.  She did not check her blood glucose at the time of the incident. Blood glucose meter reviewed.  108 in am and 73 the night before.  Occasionally glucose is 195-225 after a meal. Weight 151 lbs today increased from 148 lbs 01/01/2021. She has increased exercise to daily for 5-10 minutes on the treadmill and states that she cannot do more.  She cares for her grandchildren.  History includes Type 2 Diabetes (>5 years), HTN, HLD, vitamin D deficiency Medications include:  Jardiance, Metformin, Lantus 12 units q HS, synthroid Labs noted:  A1C is 7% 01/16/2021 which has decreased from 10.9% 10/17/2020 and 11.3% 09/26/2020.  Choldesterol 219, HDL 56, LDL 109, Triglycerides 261 09/26/2020, GFR 103 10/2019.   Patient lives with husband, 3 children and 2 grandchildren. She is from [place] and has been in the [name] since 2011. She is unable to read.  Runs on the treadmill daily for 5-10 minutes. She does not eat chicken or other meats or eggs and does eat dairy, occasional fish. Uses small amounts of salt.  Avoids sweets.  Chooses whole grains and states that she tries to be mindful of portions.  Weight 151 lb (68.5 kg). Body mass index is 25.92 kg/m.   Diabetes Self-Management Education - 03/06/21 1018       Visit Information   Visit Type Follow-up      Initial Visit   Are you currently following a meal plan? Yes    Are you taking your medications as prescribed? Yes      Psychosocial Assessment   Self-care barriers English as a second language    Self-management support Doctor's office;CDE  visits;Family    Other persons present Patient;Interpreter    Special Needs Simplified materials;Other (comment)   interpretor   Preferred Learning Style Auditory      Pre-Education Assessment   Patient understands the diabetes disease and treatment process. Demonstrates understanding / competency    Patient understands incorporating nutritional management into lifestyle. Needs Review    Patient undertands incorporating physical activity into lifestyle. Needs Review    Patient understands using medications safely. Demonstrates understanding / competency    Patient understands monitoring blood glucose, interpreting and using results Demonstrates understanding / competency    Patient understands prevention, detection, and treatment of acute complications. Demonstrates understanding / competency    Patient understands prevention, detection, and treatment of chronic complications. Demonstrates understanding / competency    Patient understands how to develop strategies to address psychosocial issues. Demonstrates understanding / competency    Patient understands how to develop strategies to promote health/change behavior. Needs Review      Complications   Last HgB A1C per patient/outside source 7 %   01/16/21 decreased from 10% 10/2020   How often do you check your blood sugar? 1-2 times/day    Fasting Blood glucose range (mg/dL) 70-129    Postprandial Blood glucose range (mg/dL) 130-179;180-200;>200;70-129    Number of hypoglycemic episodes per month 0      Dietary Intake   Breakfast buckwheat bread or oatmeal, fruit    Snack (morning) none  Lunch brown rice, vegetables, lentils/beans    Snack (afternoon) none    Dinner buckwheat bread or "corn rice"    Snack (evening) none    Beverage(s) water, juice to treat a low blood sugar, occasional unsweeted tea or occasionally with honey      Exercise   Exercise Type Light (walking / raking leaves)    How many days per week to you exercise? 7     How many minutes per day do you exercise? 10    Total minutes per week of exercise 70      Patient Education   Previous Diabetes Education Yes (please comment)   01/2021   Nutrition management  Meal options for control of blood glucose level and chronic complications.    Physical activity and exercise  Role of exercise on diabetes management, blood pressure control and cardiac health.    Medications Reviewed patients medication for diabetes, action, purpose, timing of dose and side effects.      Individualized Goals (developed by patient)   Physical Activity Exercise 5-7 days per week;30 minutes per day    Monitoring  test my blood glucose as discussed    Reducing Risk increase portions of healthy fats      Patient Self-Evaluation of Goals - Patient rates self as meeting previously set goals (% of time)   Nutrition >75%    Physical Activity 50 - 75 %    Medications >75%    Monitoring >75%    Problem Solving >75%    Reducing Risk >75%    Health Coping >75%      Post-Education Assessment   Patient understands the diabetes disease and treatment process. Demonstrates understanding / competency    Patient understands incorporating nutritional management into lifestyle. Needs Review    Patient undertands incorporating physical activity into lifestyle. Demonstrates understanding / competency    Patient understands using medications safely. Demonstrates understanding / competency    Patient understands monitoring blood glucose, interpreting and using results Demonstrates understanding / competency    Patient understands prevention, detection, and treatment of acute complications. Demonstrates understanding / competency    Patient understands prevention, detection, and treatment of chronic complications. Demonstrates understanding / competency    Patient understands how to develop strategies to address psychosocial issues. Demonstrates understanding / competency    Patient understands how  to develop strategies to promote health/change behavior. Needs Review      Outcomes   Expected Outcomes Demonstrated interest in learning. Expect positive outcomes    Future DMSE 2 months    Program Status Not Completed      Subsequent Visit   Since your last visit have you experienced any weight changes? Gain    Weight Gain (lbs) 3    Since your last visit, are you checking your blood glucose at least once a day? Yes             Individualized Plan for Diabetes Self-Management Training:   Learning Objective:  Patient will have a greater understanding of diabetes self-management. Patient education plan is to attend individual and/or group sessions per assessed needs and concerns.   Plan:   Patient Instructions   Patient Instructions  Continue to take your medications as prescribed.   Treat low blood sugar (less than 70) with 1/2 cup juice.    Increase exercise to 30 minutes most days.  Walk quickly if running is too difficult.  (10 minutes after each meal would be fine)   Continue to eat plenty  of vegetables. Continue to choose whole grains.  Watch portion size. Choose lentils or beans with each lunch and dinner.   Continue to drink plenty of water and avoid sweetened beverages. Limit added salt. Use only small amounts of oil in cooking.   Eat until you are satisfied not overfull.  Expected Outcomes:  Demonstrated interest in learning. Expect positive outcomes  Education material provided:   If problems or questions, patient to contact team via:  Phone  Future DSME appointment: 2 months

## 2021-03-06 DIAGNOSIS — G43109 Migraine with aura, not intractable, without status migrainosus: Secondary | ICD-10-CM | POA: Diagnosis not present

## 2021-03-07 DIAGNOSIS — G43109 Migraine with aura, not intractable, without status migrainosus: Secondary | ICD-10-CM | POA: Diagnosis not present

## 2021-03-08 DIAGNOSIS — G43109 Migraine with aura, not intractable, without status migrainosus: Secondary | ICD-10-CM | POA: Diagnosis not present

## 2021-03-09 DIAGNOSIS — G43109 Migraine with aura, not intractable, without status migrainosus: Secondary | ICD-10-CM | POA: Diagnosis not present

## 2021-03-10 DIAGNOSIS — G43109 Migraine with aura, not intractable, without status migrainosus: Secondary | ICD-10-CM | POA: Diagnosis not present

## 2021-03-11 ENCOUNTER — Encounter (HOSPITAL_COMMUNITY): Payer: Self-pay | Admitting: *Deleted

## 2021-03-11 ENCOUNTER — Other Ambulatory Visit: Payer: Self-pay

## 2021-03-11 ENCOUNTER — Ambulatory Visit (HOSPITAL_COMMUNITY)
Admission: EM | Admit: 2021-03-11 | Discharge: 2021-03-11 | Disposition: A | Discharge status: Home / Self Care | Payer: Medicaid Other | Attending: Emergency Medicine | Admitting: Emergency Medicine

## 2021-03-11 DIAGNOSIS — Z794 Long term (current) use of insulin: Secondary | ICD-10-CM | POA: Insufficient documentation

## 2021-03-11 DIAGNOSIS — R296 Repeated falls: Secondary | ICD-10-CM | POA: Insufficient documentation

## 2021-03-11 DIAGNOSIS — E039 Hypothyroidism, unspecified: Secondary | ICD-10-CM | POA: Insufficient documentation

## 2021-03-11 DIAGNOSIS — E119 Type 2 diabetes mellitus without complications: Secondary | ICD-10-CM | POA: Insufficient documentation

## 2021-03-11 DIAGNOSIS — E118 Type 2 diabetes mellitus with unspecified complications: Secondary | ICD-10-CM | POA: Diagnosis not present

## 2021-03-11 DIAGNOSIS — S8001XA Contusion of right knee, initial encounter: Secondary | ICD-10-CM | POA: Diagnosis not present

## 2021-03-11 DIAGNOSIS — Y92009 Unspecified place in unspecified non-institutional (private) residence as the place of occurrence of the external cause: Secondary | ICD-10-CM

## 2021-03-11 DIAGNOSIS — W19XXXA Unspecified fall, initial encounter: Secondary | ICD-10-CM

## 2021-03-11 DIAGNOSIS — G43109 Migraine with aura, not intractable, without status migrainosus: Secondary | ICD-10-CM | POA: Diagnosis not present

## 2021-03-11 DIAGNOSIS — E1169 Type 2 diabetes mellitus with other specified complication: Secondary | ICD-10-CM

## 2021-03-11 DIAGNOSIS — W1811XA Fall from or off toilet without subsequent striking against object, initial encounter: Secondary | ICD-10-CM | POA: Insufficient documentation

## 2021-03-11 DIAGNOSIS — Y92012 Bathroom of single-family (private) house as the place of occurrence of the external cause: Secondary | ICD-10-CM | POA: Insufficient documentation

## 2021-03-11 LAB — POCT URINALYSIS DIPSTICK, ED / UC
Bilirubin Urine: NEGATIVE
Glucose, UA: >=1000 mg/dL — AB
Hgb urine dipstick: NEGATIVE
Ketones, ur: NEGATIVE mg/dL
Nitrite: NEGATIVE
Protein, ur: NEGATIVE mg/dL
Specific Gravity, Urine: 1.01 (ref 1.005–1.030)
Urobilinogen, UA: 0.2 mg/dL (ref 0.0–1.0)
pH: 5 (ref 5.0–8.0)

## 2021-03-11 LAB — CBG MONITORING, ED: Glucose-Capillary: 128 mg/dL — ABNORMAL HIGH (ref 70–99)

## 2021-03-11 NOTE — Discharge Instructions (Addendum)
Your mother's EKG today is normal.  Your mother's blood sugar is essentially normal at this time however she is spilling a tremendous amount of sugar into her urine.  This finding is consistent for your mother having widely variable blood sugar levels throughout the day.    Please be sure that she is taking all of her medications exactly as prescribed on time and do not skip doses.  Most importantly, do not adjust her insulin doses based on her current blood sugar level.  Adjusting insulin levels is unsafe and can cause serious side effects such as feeling lightheaded, feeling dizzy or falling.    It is also important that she is taking her thyroid medication every day in the early hours of the morning with a glass of water and waiting an hour before eating or drinking any other food or medications.  The minor bruising and soreness that she has in her hands, wrists and right knee will heal on their own without intervention.  Imaging is not indicated at this time.  Please follow-up with her primary care provider as soon as possible to discuss the above issues and to see whether her primary care provider believes further evaluation is warranted.  Thank you for visiting urgent care today.

## 2021-03-11 NOTE — ED Provider Notes (Signed)
[facility]    CSN: [id number] Arrival date & time: 03/11/21  1558    HISTORY   Chief Complaint  Patient presents with   Fall   HPI April Mcmahon is a 61 y.o. female. Pt fell in bathroom today and last week. Pt hit her rt knee and has back pain.  Pt reports fall was due to tripping. Pt states she did not hit her head.  Daughter states that patient also fell last week.  Daughter states that patient was sitting on the toilet and attempted to stand after urinating whereupon she became lightheaded and fell forward on her outstretched hands and her right knee.  Patient states she also hit her right knee when she fell last week.  Patient demonstrates full mobility of all 3 joints.  Patient is careful to point out that she did not hit her head however she does have neck pain (which is chronic) and lower back pain (which is also chronic).  Patient states that she wanted to be "checked out".  Daughter states that patient has type 2 diabetes, currently seeing her primary care provider for regular follow-up.  States blood sugar today after falling was 121.  States blood sugar yesterday was 220, is not sure whether patient was fasting or not with either FSBG.  Daughter states patient's next PCP appointment is in March, states that they have not discussed her recent history of falls with her PCP.  The history is provided by the patient. A language interpreter was used (Daughter).  Past Medical History:  Diagnosis Date   Bilateral chronic knee pain 06/2014   Chronic low back pain 06/2014   Chronic migraine 02/1985   Depression    hx of   Diabetes mellitus without complication ([place]) 84/6962   Fall    Hyperlipidemia    Hypertension 09/2013   Shoulder pain    Patient Active Problem List   Diagnosis Date Noted   Dyslipidemia 10/17/2020   Uncontrolled type 2 diabetes mellitus with peripheral neuropathy 10/17/2020   Hyperlipidemia associated with type 2 diabetes mellitus ([place])  05/05/2020   Influenza vaccine needed 01/03/2020   Former smoker 01/03/2020   Acquired hypothyroidism 01/03/2020   Microalbuminuria 11/15/2018   Muscle cramps 02/21/2018   Hepatic steatosis 12/23/2017   MCI (mild cognitive impairment) 02/14/2017   Diabetic peripheral neuropathy ([place]) 02/14/2017   Memory loss 12/27/2016   Dysfunctional uterine bleeding 08/30/2016   Analgesic rebound headache 06/24/2016   Esophageal dysphagia 06/21/2016   Tobacco dependence 01/20/2016   Plantar fasciitis, bilateral 12/24/2015   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    Home Medications    Prior to Admission medications   Medication Sig Start Date End Date Taking? Authorizing Provider  Accu-Chek Softclix Lancets lancets Check blood sugar three times daily. Dx E11.42 10/01/20   Ladell Pier, MD  amLODipine (NORVASC) 10 MG tablet Take 1 tablet (10 mg total) by mouth daily. 12/22/20   Ladell Pier, MD  atorvastatin (LIPITOR) 20 MG tablet Take 1 tablet (20 mg total) by mouth daily. 10/17/20   Shamleffer, Melanie Crazier, MD  Blood Glucose Monitoring Suppl (ACCU-CHEK GUIDE ME) w/Device  KIT Check blood sugar three times daily. Dx E11.42 10/01/20   Ladell Pier, MD  diclofenac Sodium (VOLTAREN) 1 % GEL Apply 2 g topically 4 (four) times daily. 01/03/20   Ladell Pier, MD  empagliflozin (JARDIANCE) 25 MG TABS tablet Take 1 tablet (25 mg total) by mouth daily before breakfast. 10/17/20   Shamleffer, Melanie Crazier, MD  fluticasone (FLONASE) 50 MCG/ACT nasal spray Place 2 sprays into both nostrils daily. 05/28/20   Argentina Donovan, PA-C  gabapentin (NEURONTIN) 300  MG capsule TAKE ONE CAPSULE BY MOUTH TWICE DAILY Patient not taking: Reported on 01/16/2021 04/03/20   Ladell Pier, MD  glucose blood (ACCU-CHEK GUIDE) test strip Check blood sugar three times daily. Dx E11.42 10/01/20   Ladell Pier, MD  insulin glargine (LANTUS SOLOSTAR) 100 UNIT/ML Solostar Pen Inject 14 Units into the skin daily. 10/17/20   Shamleffer, Melanie Crazier, MD  Insulin Pen Needle 32G X 4 MM MISC 1 Device by Does not apply route daily in the afternoon. 10/17/20   Shamleffer, Melanie Crazier, MD  levothyroxine (SYNTHROID) 50 MCG tablet TAKE 1 TABLET BY MOUTH EVERY DAY 02/27/21   Ladell Pier, MD  meloxicam (MOBIC) 15 MG tablet Take 1 tablet (15 mg total) by mouth daily. For arthritis pain 12/22/20   Ladell Pier, MD  metFORMIN (GLUCOPHAGE) 1000 MG tablet Take 1 tablet (1,000 mg total) by mouth 2 (two) times daily. 10/17/20   Shamleffer, Melanie Crazier, MD  SUMAtriptan (IMITREX) 50 MG tablet Take 1 tab at the start of the headache.  May repeat in 2 hours if headache does not completely resolve.  Max 2 tabs/24 hr. 12/22/20   Ladell Pier, MD  topiramate (TOPAMAX) 25 MG tablet Take 1 tablet (25 mg total) by mouth at bedtime. 12/22/20   Ladell Pier, MD  valsartan (DIOVAN) 40 MG tablet Take 0.5 tablets (20 mg total) by mouth daily. Stop Lisinopril 12/22/20   Ladell Pier, MD  venlafaxine [facility]) 37.5 MG tablet Take 1 tablet (37.5 mg total) by mouth 2 (two) times daily with a meal. 02/09/17 12/14/18  Marcial Pacas, MD    Family History Family History  Problem Relation Age of Onset   Migraines Mother    Esophageal cancer Other    Breast cancer Neg Hx    Colon cancer Neg Hx    Rectal cancer Neg Hx    Stomach cancer Neg Hx    Social History Social History   Tobacco Use   Smoking status: Former   Smokeless tobacco: Never  Vaping Use   Vaping Use: Never used  Substance Use Topics   Alcohol use: No   Drug use: No   Allergies   Elavil  [amitriptyline hcl]  Review of Systems Review of Systems Pertinent findings noted in history of present illness.   Physical Exam Triage Vital Signs ED Triage Vitals  Enc Vitals Group     BP 11/28/20 0827 (!) 147/82     Pulse Rate 11/28/20 0827 72     Resp 11/28/20 0827 18     Temp 11/28/20 0827 98.3 F (36.8 C)     Temp Source 11/28/20 0827 Oral     SpO2 11/28/20 0827 98 %     Weight --      Height --      Head Circumference --      Peak Flow --      Pain Score 11/28/20 0826 5     Pain Loc --  Pain Edu? --      Excl. in [place]? --   No data found.  Updated Vital Signs BP (!) 148/83    Pulse 64    Temp 98.2 F (36.8 C)    Resp 18    SpO2 98%   Physical Exam Vitals and nursing note reviewed.  Constitutional:      General: She is not in acute distress.    Appearance: Normal appearance. She is not ill-appearing.  HENT:     Head: Normocephalic and atraumatic.     Right Ear: Tympanic membrane, ear canal and external ear normal.     Left Ear: Tympanic membrane, ear canal and external ear normal.     Nose: Nose normal.  Eyes:     General: Lids are normal.        Right eye: No discharge.        Left eye: No discharge.     Extraocular Movements: Extraocular movements intact.     Conjunctiva/sclera: Conjunctivae normal.     Right eye: Right conjunctiva is not injected.     Left eye: Left conjunctiva is not injected.     Pupils: Pupils are equal, round, and reactive to light.  Neck:     Trachea: Trachea and phonation normal.  Cardiovascular:     Rate and Rhythm: Normal rate and regular rhythm.     Pulses: Normal pulses.     Heart sounds: Normal heart sounds. No murmur heard.   No friction rub. No gallop.  Pulmonary:     Effort: Pulmonary effort is normal. No accessory muscle usage, prolonged expiration or respiratory distress.     Breath sounds: Normal breath sounds. No stridor, decreased air movement or transmitted upper airway sounds. No decreased breath sounds,  wheezing, rhonchi or rales.  Chest:     Chest wall: No tenderness.  Musculoskeletal:        General: No swelling, tenderness, deformity or signs of injury. Normal range of motion.     Cervical back: Normal range of motion and neck supple. Normal range of motion.  Lymphadenopathy:     Cervical: No cervical adenopathy.  Skin:    General: Skin is warm and dry.     Findings: Bruising (Right knee) present. No erythema or rash.  Neurological:     General: No focal deficit present.     Mental Status: She is alert and oriented to person, place, and time. Mental status is at baseline.     Cranial Nerves: No cranial nerve deficit.     Sensory: No sensory deficit.     Motor: No weakness.     Coordination: Coordination normal.     Gait: Gait normal.     Deep Tendon Reflexes: Reflexes normal.  Psychiatric:        Mood and Affect: Mood normal.        Behavior: Behavior normal.        Thought Content: Thought content normal.        Judgment: Judgment normal.    Visual Acuity Right Eye Distance:   Left Eye Distance:   Bilateral Distance:    Right Eye Near:   Left Eye Near:    Bilateral Near:     UC Couse / Diagnostics / Procedures:    EKG  Radiology No results found.  Procedures Procedures (including critical care time)  UC Diagnoses / Final Clinical Impressions(s)   I have reviewed the triage vital signs and the nursing notes.  Pertinent labs & imaging  results that were available during my care of the patient were reviewed by me and considered in my medical decision making (see chart for details).    Final diagnoses:  Fall in home, initial encounter  Falls frequently  Type 2 diabetes mellitus without complication, with long-term current use of insulin (HCC)  Hypothyroidism, unspecified type   EKG is essentially normal today, patient has a nonfasting blood sugar of 128.  Daughter states patient is taking her levothyroxine exactly as prescribed.  Urinalysis today is remarkable  for significantly elevated blood sugar level, white blood cells are also present so urine culture will be ordered, treatment will be provided as indicated based on urine culture result.  Patient advised to follow-up with primary care regarding recent falls.  Patient also advised that bruising will resolve without intervention.  Patient instructed to take all medications as prescribed, particularly her diabetes and thyroid medications.  ED Prescriptions   None    PDMP not reviewed this encounter.  Pending results:  Labs Reviewed  POCT URINALYSIS DIPSTICK, ED / UC - Abnormal; Notable for the following components:      Result Value   Glucose, UA >=1000 (*)    Leukocytes,Ua SMALL (*)    All other components within normal limits  CBG MONITORING, ED - Abnormal; Notable for the following components:   Glucose-Capillary 128 (*)    All other components within normal limits  URINE CULTURE    Medications Ordered in UC: Medications - No data to display  Disposition Upon Discharge:  Condition: stable for discharge home Home: take medications as prescribed; routine discharge instructions as discussed; follow up as advised.  Patient presented with an acute illness with associated systemic symptoms and significant discomfort requiring urgent management. In my opinion, this is a condition that a prudent lay person (someone who possesses an average knowledge of health and medicine) may potentially expect to result in complications if not addressed urgently such as respiratory distress, impairment of bodily function or dysfunction of bodily organs.   Routine symptom specific, illness specific and/or disease specific instructions were discussed with the patient and/or caregiver at length.   As such, the patient has been evaluated and assessed, work-up was performed and treatment was provided in alignment with urgent care protocols and evidence based medicine.  Patient/parent/caregiver has been advised  that the patient may require follow up for further testing and treatment if the symptoms continue in spite of treatment, as clinically indicated and appropriate.  If the patient was tested for COVID-19, Influenza and/or RSV, then the patient/parent/guardian was advised to isolate at home pending the results of his/her diagnostic coronavirus test and potentially longer if theyre positive. I have also advised pt that if his/her COVID-19 test returns positive, it's recommended to self-isolate for at least 10 days after symptoms first appeared AND until fever-free for 24 hours without fever reducer AND other symptoms have improved or resolved. Discussed self-isolation recommendations as well as instructions for household member/close contacts as per the [facility] and [place] DHHS, and also gave patient the [place] packet with this information.  Patient/parent/caregiver has been advised to return to the [facility] or PCP in 3-5 days if no better; to PCP or the Emergency Department if new signs and symptoms develop, or if the current signs or symptoms continue to change or worsen for further workup, evaluation and treatment as clinically indicated and appropriate  The patient will follow up with their current PCP if and as advised. If the patient does not currently have  a PCP we will assist them in obtaining one.   The patient may need specialty follow up if the symptoms continue, in spite of conservative treatment and management, for further workup, evaluation, consultation and treatment as clinically indicated and appropriate.   Patient/parent/caregiver verbalized understanding and agreement of plan as discussed.  All questions were addressed during visit.  Please see discharge instructions below for further details of plan.  Discharge Instructions:   Discharge Instructions      Your mother's EKG today is normal.  Your mother's blood sugar is essentially normal at this time however she is spilling a tremendous amount  of sugar into her urine.  This finding is consistent for your mother having widely variable blood sugar levels throughout the day.    Please be sure that she is taking all of her medications exactly as prescribed on time and do not skip doses.  Most importantly, do not adjust her insulin doses based on her current blood sugar level.  Adjusting insulin levels is unsafe and can cause serious side effects such as feeling lightheaded, feeling dizzy or falling.    It is also important that she is taking her thyroid medication every day in the early hours of the morning with a glass of water and waiting an hour before eating or drinking any other food or medications.  The minor bruising and soreness that she has in her hands, wrists and right knee will heal on their own without intervention.  Imaging is not indicated at this time.  Please follow-up with her primary care provider as soon as possible to discuss the above issues and to see whether her primary care provider believes further evaluation is warranted.  Thank you for visiting urgent care today.      This office note has been dictated using Museum/gallery curator.  Unfortunately, and despite my best efforts, this method of dictation can sometimes lead to occasional typographical or grammatical errors.  I apologize in advance if this occurs.     Lynden Oxford Scales, PA-C 03/11/21 1736

## 2021-03-11 NOTE — ED Triage Notes (Signed)
Pt fell in bathroom today and last week . Pt hit her rt knee and has back pain.  Pt reports fall was due to tripping. Pt did not hit her head.

## 2021-03-12 ENCOUNTER — Ambulatory Visit: Payer: Self-pay

## 2021-03-12 DIAGNOSIS — G43109 Migraine with aura, not intractable, without status migrainosus: Secondary | ICD-10-CM | POA: Diagnosis not present

## 2021-03-12 NOTE — Telephone Encounter (Signed)
° °  Chief Complaint: [place] 03/11/21, seen in ED Symptoms: Back, neck and right knee pain Frequency: Yesterday Pertinent Negatives: Patient denies  Disposition: [] ED /[] Urgent Care (no appt availability in office) / [x] Appointment(In office/virtual)/ []  [facility]/ [] Home Care/ [] Refused Recommended Disposition /[] [facility]/ []  Follow-up with PCP Additional Notes:   Reason for Disposition  [1] After 3 days (72 hours) AND [2] back pain not improving  Answer Assessment - Initial Assessment Questions 1. MECHANISM: "How did the injury happen?" (Consider the possibility of domestic violence or elder abuse)     Fall  2. ONSET: "When did the injury happen?" (Minutes or hours ago)     Yesterday 3. LOCATION: "What part of the back is injured?"     Back, neck, right knee 4. SEVERITY: "Can you move the back normally?"     Yes 5. PAIN: "Is there any pain?" If Yes, ask: "How bad is the pain?"   (Scale 1-10; or mild, moderate, severe)     10 6. CORD SYMPTOMS: Any weakness or numbness of the arms or legs?"     Weakness in legs 7. SIZE: For cuts, bruises, or swelling, ask: "How large is it?" (e.g., inches or centimeters)     Bruises 8. TETANUS: For any breaks in the skin, ask: "When was the last tetanus booster?"     Unsure 9. OTHER SYMPTOMS: "Do you have any other symptoms?" (e.g., abdominal pain, blood in urine)     No 10. PREGNANCY: "Is there any chance you are pregnant?" "When was your last menstrual period?"       No  Protocols used: Back Injury-A-AH

## 2021-03-13 ENCOUNTER — Ambulatory Visit: Payer: Medicaid Other | Attending: Nurse Practitioner | Admitting: Nurse Practitioner

## 2021-03-13 ENCOUNTER — Encounter: Payer: Self-pay | Admitting: Nurse Practitioner

## 2021-03-13 VITALS — Ht 64.0 in | Wt 151.0 lb

## 2021-03-13 DIAGNOSIS — G8929 Other chronic pain: Secondary | ICD-10-CM

## 2021-03-13 DIAGNOSIS — M542 Cervicalgia: Secondary | ICD-10-CM | POA: Diagnosis not present

## 2021-03-13 DIAGNOSIS — R296 Repeated falls: Secondary | ICD-10-CM

## 2021-03-13 DIAGNOSIS — G43109 Migraine with aura, not intractable, without status migrainosus: Secondary | ICD-10-CM | POA: Diagnosis not present

## 2021-03-13 DIAGNOSIS — J069 Acute upper respiratory infection, unspecified: Secondary | ICD-10-CM | POA: Diagnosis not present

## 2021-03-13 DIAGNOSIS — R42 Dizziness and giddiness: Secondary | ICD-10-CM

## 2021-03-13 DIAGNOSIS — M545 Low back pain, unspecified: Secondary | ICD-10-CM

## 2021-03-13 LAB — URINE CULTURE

## 2021-03-13 MED ORDER — MECLIZINE HCL 25 MG PO TABS: 25.0000 mg | ORAL_TABLET | Freq: Three times a day (TID) | ORAL | 0 refills | Status: AC | PRN

## 2021-03-13 MED ORDER — GABAPENTIN 600 MG PO TABS: 300.0000 mg | ORAL_TABLET | Freq: Three times a day (TID) | ORAL | 1 refills | Status: AC

## 2021-03-13 MED ORDER — FLUTICASONE PROPIONATE 50 MCG/ACT NA SUSP: 2.0000 | Freq: Every day | NASAL | 0 refills | Status: AC

## 2021-03-13 NOTE — Progress Notes (Signed)
Assessment & Plan:  April Mcmahon was seen today for fall.  Diagnoses and all orders for this visit:  Frequent falls -     PT vestibular rehab; Future -     Ambulatory referral to Physical Therapy -     DG Cervical Spine Complete; Future -     DG Lumbar Spine Complete; Future  Dizziness -     PT vestibular rehab; Future -     Ambulatory referral to Physical Therapy -     fluticasone (FLONASE) 50 MCG/ACT nasal spray; Place 2 sprays into both nostrils daily. -     meclizine (ANTIVERT) 25 MG tablet; Take 1 tablet (25 mg total) by mouth 3 (three) times daily as needed for dizziness.  Upper respiratory tract infection, unspecified type -     fluticasone (FLONASE) 50 MCG/ACT nasal spray; Place 2 sprays into both nostrils daily.  Chronic neck pain -     gabapentin (NEURONTIN) 600 MG tablet; Take 0.5 tablets (300 mg total) by mouth 3 (three) times daily. -     DG Cervical Spine Complete; Future  Chronic bilateral low back pain without sciatica -     gabapentin (NEURONTIN) 600 MG tablet; Take 0.5 tablets (300 mg total) by mouth 3 (three) times daily. -     DG Lumbar Spine Complete; Future    Patient has been counseled on age-appropriate routine health concerns for screening and prevention. These are reviewed and up-to-date. Referrals have been placed accordingly. Immunizations are up-to-date or declined.    Subjective:   Chief Complaint  Patient presents with   Fall   HPI April Mcmahon 61 y.o. female presents to office today with complaints of dizziness and frequent falls. Neither of which are new findings.  [employer] interpreter was used to communicate directly with patient for the entire encounter including providing detailed patient instructions.   She was evaluated in the ED 03-11-2021 with complaints of fall. Urinalysis negative.    She has a chronic history of dizziness and was prescribed meclizine in the past which she is currently not taking. States it ran out and she did not  pick up any refills. She has worked with physical therapy as well for neck pain. States currently states her right leg is weaker than the left.  PMH includes Dizziness  associated with migraines for which she was prescribed migraine medication and meclizine.  She has not had any falls since being seen in the emergeny room on the 03-10-2021. States dizziness lasts 15-30 minutes and goes away on its own. Worse with sitting to standing. Negative for orthostatic BPs today.   Patient's daughter who is accompanying here today is requesting imaging of her neck and back.  Denies injury or trauma. Both neck and back pain are chronic and not new findings.    URI Patient complains of symptoms of a URI. Symptoms include congestion, plugged sensation in both ears, and sore throat. Onset of symptoms was a few days ago, unchanged since that time.    Review of Systems  Constitutional:  Negative for fever, malaise/fatigue and weight loss.  HENT:  Positive for congestion, ear pain and sore throat. Negative for nosebleeds.   Eyes: Negative.  Negative for blurred vision, double vision and photophobia.  Respiratory: Negative.  Negative for cough and shortness of breath.   Cardiovascular: Negative.  Negative for chest pain, palpitations and leg swelling.  Gastrointestinal: Negative.  Negative for heartburn, nausea and vomiting.  Musculoskeletal:  Positive for back pain and neck  pain. Negative for myalgias.  Neurological: Negative.  Negative for dizziness, focal weakness, seizures and headaches.  Psychiatric/Behavioral: Negative.  Negative for suicidal ideas.    Past Medical History:  Diagnosis Date   Bilateral chronic knee pain 06/2014   Chronic low back pain 06/2014   Chronic migraine 02/1985   Depression    hx of   Diabetes mellitus without complication ([place]) 42/7062   Fall    Hyperlipidemia    Hypertension 09/2013   Shoulder pain     Past Surgical History:  Procedure Laterality Date   no previous  surgeries      Family History  Problem Relation Age of Onset   Migraines Mother    Esophageal cancer Other    Breast cancer Neg Hx    Colon cancer Neg Hx    Rectal cancer Neg Hx    Stomach cancer Neg Hx     Social History Reviewed with no changes to be made today.   Outpatient Medications Prior to Visit  Medication Sig Dispense Refill   Accu-Chek Softclix Lancets lancets Check blood sugar three times daily. Dx E11.42 100 each 12   amLODipine (NORVASC) 10 MG tablet Take 1 tablet (10 mg total) by mouth daily. 30 tablet 11   atorvastatin (LIPITOR) 20 MG tablet Take 1 tablet (20 mg total) by mouth daily. 90 tablet 3   Blood Glucose Monitoring Suppl (ACCU-CHEK GUIDE ME) w/Device KIT Check blood sugar three times daily. Dx E11.42 1 kit 0   diclofenac Sodium (VOLTAREN) 1 % GEL Apply 2 g topically 4 (four) times daily. 100 g 3   empagliflozin (JARDIANCE) 25 MG TABS tablet Take 1 tablet (25 mg total) by mouth daily before breakfast. 90 tablet 3   glucose blood (ACCU-CHEK GUIDE) test strip Check blood sugar three times daily. Dx E11.42 100 each 2   insulin glargine (LANTUS SOLOSTAR) 100 UNIT/ML Solostar Pen Inject 14 Units into the skin daily. 15 mL 6   Insulin Pen Needle 32G X 4 MM MISC 1 Device by Does not apply route daily in the afternoon. 100 each 3   levothyroxine (SYNTHROID) 50 MCG tablet TAKE 1 TABLET BY MOUTH EVERY DAY 90 tablet 2   meloxicam (MOBIC) 15 MG tablet Take 1 tablet (15 mg total) by mouth daily. For arthritis pain 30 tablet 6   metFORMIN (GLUCOPHAGE) 1000 MG tablet Take 1 tablet (1,000 mg total) by mouth 2 (two) times daily. 180 tablet 3   SUMAtriptan (IMITREX) 50 MG tablet Take 1 tab at the start of the headache.  May repeat in 2 hours if headache does not completely resolve.  Max 2 tabs/24 hr. 10 tablet 2   topiramate (TOPAMAX) 25 MG tablet Take 1 tablet (25 mg total) by mouth at bedtime. 30 tablet 3   valsartan (DIOVAN) 40 MG tablet Take 0.5 tablets (20 mg total) by mouth  daily. Stop Lisinopril 45 tablet 3   fluticasone (FLONASE) 50 MCG/ACT nasal spray Place 2 sprays into both nostrils daily. 16 g 6   No facility-administered medications prior to visit.    Allergies  Allergen Reactions   Elavil [Amitriptyline Hcl] Diarrhea and Nausea And Vomiting       Objective:    Ht 5' 4"  (1.626 m)    Wt 151 lb (68.5 kg)    SpO2 94%    BMI 25.92 kg/m  Wt Readings from Last 3 Encounters:  03/13/21 151 lb (68.5 kg)  03/06/21 151 lb (68.5 kg)  01/16/21 150 lb 3.2 oz (  68.1 kg)    Physical Exam Vitals and nursing note reviewed.  Constitutional:      Appearance: She is well-developed.  HENT:     Head: Normocephalic and atraumatic.     Right Ear: Hearing, tympanic membrane, ear canal and external ear normal.     Left Ear: Hearing, tympanic membrane, ear canal and external ear normal.     Nose:     Right Turbinates: Enlarged and swollen.     Left Turbinates: Enlarged and swollen.     Mouth/Throat:     Lips: Pink.     Mouth: Mucous membranes are moist.     Pharynx: No pharyngeal swelling or posterior oropharyngeal erythema.  Cardiovascular:     Rate and Rhythm: Normal rate and regular rhythm.     Heart sounds: Normal heart sounds. No murmur heard.   No friction rub. No gallop.  Pulmonary:     Effort: Pulmonary effort is normal. No tachypnea or respiratory distress.     Breath sounds: Normal breath sounds. No decreased breath sounds, wheezing, rhonchi or rales.  Chest:     Chest wall: No tenderness.  Abdominal:     General: Bowel sounds are normal.     Palpations: Abdomen is soft.  Musculoskeletal:        General: Normal range of motion.     Cervical back: Normal range of motion.  Lymphadenopathy:     Cervical: No cervical adenopathy.  Skin:    General: Skin is warm and dry.  Neurological:     Mental Status: She is alert and oriented to person, place, and time.     Coordination: Coordination normal.  Psychiatric:        Behavior: Behavior normal.  Behavior is cooperative.        Thought Content: Thought content normal.        Judgment: Judgment normal.         Patient has been counseled extensively about nutrition and exercise as well as the importance of adherence with medications and regular follow-up. The patient was given clear instructions to go to ER or return to medical center if symptoms don't improve, worsen or new problems develop. The patient verbalized understanding.   Follow-up: Return for Already scheduled with johnson next month.   Gildardo Pounds, FNP-BC [facility], [place]   03/18/2021, 11:12 PM

## 2021-03-14 ENCOUNTER — Ambulatory Visit (HOSPITAL_COMMUNITY)
Admission: RE | Admit: 2021-03-14 | Discharge: 2021-03-14 | Disposition: A | Discharge status: Home / Self Care | Payer: Medicaid Other | Source: Ambulatory Visit | Attending: Nurse Practitioner | Admitting: Nurse Practitioner

## 2021-03-14 DIAGNOSIS — G43109 Migraine with aura, not intractable, without status migrainosus: Secondary | ICD-10-CM | POA: Diagnosis not present

## 2021-03-14 DIAGNOSIS — M545 Low back pain, unspecified: Secondary | ICD-10-CM

## 2021-03-14 DIAGNOSIS — G8929 Other chronic pain: Secondary | ICD-10-CM

## 2021-03-14 DIAGNOSIS — M542 Cervicalgia: Secondary | ICD-10-CM | POA: Diagnosis not present

## 2021-03-14 DIAGNOSIS — R296 Repeated falls: Secondary | ICD-10-CM | POA: Insufficient documentation

## 2021-03-15 DIAGNOSIS — G43109 Migraine with aura, not intractable, without status migrainosus: Secondary | ICD-10-CM | POA: Diagnosis not present

## 2021-03-16 DIAGNOSIS — G43109 Migraine with aura, not intractable, without status migrainosus: Secondary | ICD-10-CM | POA: Diagnosis not present

## 2021-03-17 DIAGNOSIS — G43109 Migraine with aura, not intractable, without status migrainosus: Secondary | ICD-10-CM | POA: Diagnosis not present

## 2021-03-18 ENCOUNTER — Encounter: Payer: Self-pay | Admitting: Nurse Practitioner

## 2021-03-18 DIAGNOSIS — G43109 Migraine with aura, not intractable, without status migrainosus: Secondary | ICD-10-CM | POA: Diagnosis not present

## 2021-03-19 ENCOUNTER — Telehealth: Payer: Self-pay

## 2021-03-19 DIAGNOSIS — G43109 Migraine with aura, not intractable, without status migrainosus: Secondary | ICD-10-CM | POA: Diagnosis not present

## 2021-03-19 NOTE — Telephone Encounter (Signed)
Contacted pt to go over lab results pt didn't answer lvm   Sent a CRM and forward labs to NT to give pt labs when they call back   

## 2021-03-19 NOTE — Telephone Encounter (Signed)
No results interpreted by provider seen, routing back to office.

## 2021-03-20 DIAGNOSIS — G43109 Migraine with aura, not intractable, without status migrainosus: Secondary | ICD-10-CM | POA: Diagnosis not present

## 2021-03-21 DIAGNOSIS — G43109 Migraine with aura, not intractable, without status migrainosus: Secondary | ICD-10-CM | POA: Diagnosis not present

## 2021-03-22 DIAGNOSIS — G43109 Migraine with aura, not intractable, without status migrainosus: Secondary | ICD-10-CM | POA: Diagnosis not present

## 2021-03-23 DIAGNOSIS — G43109 Migraine with aura, not intractable, without status migrainosus: Secondary | ICD-10-CM | POA: Diagnosis not present

## 2021-03-24 DIAGNOSIS — G43109 Migraine with aura, not intractable, without status migrainosus: Secondary | ICD-10-CM | POA: Diagnosis not present

## 2021-03-25 DIAGNOSIS — G43109 Migraine with aura, not intractable, without status migrainosus: Secondary | ICD-10-CM | POA: Diagnosis not present

## 2021-03-26 DIAGNOSIS — G43109 Migraine with aura, not intractable, without status migrainosus: Secondary | ICD-10-CM | POA: Diagnosis not present

## 2021-03-27 DIAGNOSIS — G43109 Migraine with aura, not intractable, without status migrainosus: Secondary | ICD-10-CM | POA: Diagnosis not present

## 2021-03-28 DIAGNOSIS — G43109 Migraine with aura, not intractable, without status migrainosus: Secondary | ICD-10-CM | POA: Diagnosis not present

## 2021-03-29 DIAGNOSIS — G43109 Migraine with aura, not intractable, without status migrainosus: Secondary | ICD-10-CM | POA: Diagnosis not present

## 2021-03-30 DIAGNOSIS — G43109 Migraine with aura, not intractable, without status migrainosus: Secondary | ICD-10-CM | POA: Diagnosis not present

## 2021-03-31 DIAGNOSIS — G43109 Migraine with aura, not intractable, without status migrainosus: Secondary | ICD-10-CM | POA: Diagnosis not present

## 2021-04-01 DIAGNOSIS — G43109 Migraine with aura, not intractable, without status migrainosus: Secondary | ICD-10-CM | POA: Diagnosis not present

## 2021-04-02 DIAGNOSIS — G43109 Migraine with aura, not intractable, without status migrainosus: Secondary | ICD-10-CM | POA: Diagnosis not present

## 2021-04-03 ENCOUNTER — Ambulatory Visit: Payer: Self-pay | Admitting: Physical Therapy

## 2021-04-03 DIAGNOSIS — G43109 Migraine with aura, not intractable, without status migrainosus: Secondary | ICD-10-CM | POA: Diagnosis not present

## 2021-04-04 DIAGNOSIS — G43109 Migraine with aura, not intractable, without status migrainosus: Secondary | ICD-10-CM | POA: Diagnosis not present

## 2021-04-05 DIAGNOSIS — G43109 Migraine with aura, not intractable, without status migrainosus: Secondary | ICD-10-CM | POA: Diagnosis not present

## 2021-04-06 DIAGNOSIS — G43109 Migraine with aura, not intractable, without status migrainosus: Secondary | ICD-10-CM | POA: Diagnosis not present

## 2021-04-07 DIAGNOSIS — G43109 Migraine with aura, not intractable, without status migrainosus: Secondary | ICD-10-CM | POA: Diagnosis not present

## 2021-04-08 DIAGNOSIS — G43109 Migraine with aura, not intractable, without status migrainosus: Secondary | ICD-10-CM | POA: Diagnosis not present

## 2021-04-09 DIAGNOSIS — G43109 Migraine with aura, not intractable, without status migrainosus: Secondary | ICD-10-CM | POA: Diagnosis not present

## 2021-04-10 DIAGNOSIS — G43109 Migraine with aura, not intractable, without status migrainosus: Secondary | ICD-10-CM | POA: Diagnosis not present

## 2021-04-11 DIAGNOSIS — G43109 Migraine with aura, not intractable, without status migrainosus: Secondary | ICD-10-CM | POA: Diagnosis not present

## 2021-04-12 DIAGNOSIS — G43109 Migraine with aura, not intractable, without status migrainosus: Secondary | ICD-10-CM | POA: Diagnosis not present

## 2021-04-13 DIAGNOSIS — G43109 Migraine with aura, not intractable, without status migrainosus: Secondary | ICD-10-CM | POA: Diagnosis not present

## 2021-04-14 DIAGNOSIS — G43109 Migraine with aura, not intractable, without status migrainosus: Secondary | ICD-10-CM | POA: Diagnosis not present

## 2021-04-15 DIAGNOSIS — G43109 Migraine with aura, not intractable, without status migrainosus: Secondary | ICD-10-CM | POA: Diagnosis not present

## 2021-04-16 DIAGNOSIS — G43109 Migraine with aura, not intractable, without status migrainosus: Secondary | ICD-10-CM | POA: Diagnosis not present

## 2021-04-17 ENCOUNTER — Ambulatory Visit: Payer: Medicaid Other | Attending: Nurse Practitioner

## 2021-04-17 DIAGNOSIS — R296 Repeated falls: Secondary | ICD-10-CM | POA: Diagnosis not present

## 2021-04-17 DIAGNOSIS — G43109 Migraine with aura, not intractable, without status migrainosus: Secondary | ICD-10-CM | POA: Diagnosis not present

## 2021-04-17 DIAGNOSIS — G8929 Other chronic pain: Secondary | ICD-10-CM | POA: Insufficient documentation

## 2021-04-17 DIAGNOSIS — R42 Dizziness and giddiness: Secondary | ICD-10-CM | POA: Insufficient documentation

## 2021-04-17 DIAGNOSIS — M25561 Pain in right knee: Secondary | ICD-10-CM | POA: Diagnosis not present

## 2021-04-17 DIAGNOSIS — R262 Difficulty in walking, not elsewhere classified: Secondary | ICD-10-CM | POA: Diagnosis not present

## 2021-04-17 DIAGNOSIS — R2681 Unsteadiness on feet: Secondary | ICD-10-CM | POA: Insufficient documentation

## 2021-04-17 NOTE — Therapy (Addendum)
?OUTPATIENT PHYSICAL THERAPY VESTIBULAR EVALUATION ? ? ? ? ?Patient Name: April Mcmahon ?MRN: [id number] ?DOB:August 02, 1960, 61 y.o., female ?Today's Date: 04/17/2021 ? ?PCP: Ladell Pier, MD ?REFERRING PROVIDER: Gildardo Pounds, NP ? ? PT End of Session - 04/17/21 1517   ? ? Visit Number 1   ? Number of Visits 7   ? Date for PT Re-Evaluation 06/05/21   ? Authorization Type [employer] (VL: 27)   ? Authorization Time Period awaiting authorization   ? PT Start Time 4818   ? PT Stop Time 1600   ? PT Time Calculation (min) 42 min   ? Activity Tolerance Patient tolerated treatment well   ? Behavior During Therapy [facility] for tasks assessed/performed   ? ?  ?  ? ?  ? ? ?Past Medical History:  ?Diagnosis Date  ? Bilateral chronic knee pain 06/2014  ? Chronic low back pain 06/2014  ? Chronic migraine 02/1985  ? Depression   ? hx of  ? Diabetes mellitus without complication ([place]) 56/3149  ? Fall   ? Hyperlipidemia   ? Hypertension 09/2013  ? Shoulder pain   ? ?Past Surgical History:  ?Procedure Laterality Date  ? no previous surgeries    ? ?Patient Active Problem List  ? Diagnosis Date Noted  ? Dyslipidemia 10/17/2020  ? Uncontrolled type 2 diabetes mellitus with peripheral neuropathy 10/17/2020  ? Hyperlipidemia associated with type 2 diabetes mellitus ([place]) 05/05/2020  ? Influenza vaccine needed 01/03/2020  ? Former smoker 01/03/2020  ? Acquired hypothyroidism 01/03/2020  ? Microalbuminuria 11/15/2018  ? Muscle cramps 02/21/2018  ? Hepatic steatosis 12/23/2017  ? MCI (mild cognitive impairment) 02/14/2017  ? Diabetic peripheral neuropathy ([place]) 02/14/2017  ? Memory loss 12/27/2016  ? Dysfunctional uterine bleeding 08/30/2016  ? Analgesic rebound headache 06/24/2016  ? Esophageal dysphagia 06/21/2016  ? Tobacco dependence 01/20/2016  ? Plantar fasciitis, bilateral 12/24/2015  ? DJD (degenerative joint disease) of knee 06/20/2015  ? Type 2 diabetes mellitus with peripheral neuropathy ([place]) 06/19/2015  ? Essential  hypertension 06/19/2015  ? Vitamin D deficiency 09/24/2013  ? Migraine 05/15/2013  ? Low back pain 08/18/2012  ? ? ?ONSET DATE: 03/13/21 (Referral Date)  ? ?REFERRING DIAG: R29.6 (ICD-10-CM) - Frequent falls, R42 (ICD-10-CM) - Dizziness ? ?THERAPY DIAG:  ?Unsteadiness on feet ? ?Chronic pain of right knee ? ?Dizziness and giddiness ? ?Difficulty in walking, not elsewhere classified ? ?SUBJECTIVE:  ? ?SUBJECTIVE STATEMENT: ?Pt had recent ED visit due to fall after standing and became lightheaded. Denies hitting her head at time of fall. Patient reports feels as the legs will suddenly get weak and is causing her to fall. Started to notice this approx. 4 years ago. Patient reports sometimes dizziness is associated but does not occur every time. Patient reports some pinching pain in the knee area at times that can be 10/10. Denies recent injuries to the area. Pt has had to use a cane at times due to weakness/dizziness. Reports some dizziness when laying down and does report that it is spinning at times.  ?Pt accompanied by: significant other and interpreter: In person Interpreter, [place] ? ?PERTINENT HISTORY: B Chronic Knee Pain, Chronic LBP, Migraines, Depression, DM, Falls, HLD, HTN ? ? ?PAIN:  ?Are you having pain? No ? ?PRECAUTIONS: Fall ? ?WEIGHT BEARING RESTRICTIONS No ? ?FALLS: Has patient fallen in last 6 months? Yes, Number of falls: uncountable, reports too many falls.  ? ?LIVING ENVIRONMENT: ?Lives with: lives with their family ?Lives in:  House/apartment ?Stairs: Yes; but does not go into the basement ?Has following equipment at home: Single point cane and Walker - 2 wheeled ? ?PLOF: Independent with household mobility with device ? ?PATIENT GOALS: ? ?OBJECTIVE:  ? ?COGNITION: ?Overall cognitive status: Difficulty to assess ?  ?SENSATION: ?Light touch: WFL ? ?POSTURE: rounded shoulders and forward head ? ?ROM:  ? R Knee ROM: Extension: 0 Degs, Flexion: 121 degs (with pain at end range) ? ?MMT:   ? ?MMT Right ?04/17/2021 Left ?04/17/2021  ?Hip flexion 4-/5 5/5  ?Hip abduction    ?Hip adduction    ?Hip internal rotation    ?Hip external rotation    ?Knee flexion 4-/5 5/5  ?Knee extension 4-/5 5/5  ?Ankle dorsiflexion    ?Ankle plantarflexion    ?Ankle inversion    ?Ankle eversion    ?(Blank rows = not tested) ? ?TRANSFERS: ?Assistive device utilized: None  ?Sit to stand: SBA ?Stand to sit: SBA ? ?GAIT: ?Gait pattern: step through pattern, decreased step length- Right, decreased step length- Left, and decreased stance time- Right ?Distance walked: 100 ?Assistive device utilized: None ?Level of assistance: SBA and CGA ?Comments: mild unsteadiness with ambulation, no buckling noted on RLE during eval, but increased sway with intermittent touch to wall needed.  ? ?FUNCTIONAL TESTs:  ?5 times sit to stand: 25.50 secs with BUE support, pt reports increased sharp pain 10/10 after completion of 5x sit <> stand.  ? ? ? ?VESTIBULAR ASSESSMENT ? ?  ? SYMPTOM BEHAVIOR: ?  Subjective history: Pt reports dizziness is not as big of issue, more worried about knee and falls. But does have intermittent spinning and lightheadedness.  ?  Non-Vestibular symptoms: headaches ?  Type of dizziness: Imbalance (Disequilibrium), Spinning/Vertigo, and Lightheadedness/Faint ?  Frequency: daily ?  Duration: 15 minutes ?  Aggravating factors: Induced by position change: supine to sit and sit to stand ?  Relieving factors: rest ?  Progression of symptoms: unchanged ? ? OCULOMOTOR EXAM: ?  Ocular Alignment: normal ?  Ocular ROM: No Limitations ?  Spontaneous Nystagmus: absent ?  Gaze-Induced Nystagmus: absent ?  Smooth Pursuits: saccades; moderate dizziness ?  Saccades: hypometric/undershoots, slow, and frequent blinking, with increased (moderate) dizziness ?  ? ? VESTIBULAR - OCULAR REFLEX:  ?  Slow VOR: Comment: difficulty maintaining eyes focused, moderate dizziness.  ?  Head-Impulse Test: Not Tested due to symptoms with Slow VOR ?  Dynamic  Visual Acuity: Not able to be assessed ?  ? POSITIONAL TESTING: Right Dix-Hallpike: none; Duration:0 ?Left Dix-Hallpike: none; Duration: 0 ?  ? ?MOTION SENSITIVITY: ? ?  Motion Sensitivity Quotient ? ?Intensity: 0 = none, 1 = Lightheaded, 2 = Mild, 3 = Moderate, 4 = Severe, 5 = Vomiting ? Intensity  ?1. Sitting to supine 0  ?2. Supine to L side   ?3. Supine to R side   ?4. Supine to sitting 1  ?5. L Hallpike-Dix 0  ?6. Up from L  1  ?7. R Hallpike-Dix 1  ?8. Up from R    ?9. Sitting, head  ?tipped to L knee   ?10. Head up from L  ?knee   ?11. Sitting, head  ?tipped to R knee   ?12. Head up from R  ?knee   ?13. Sitting head turns x5   ?14.Sitting head nods x5   ?15. In stance, 180?  ?turn to L    ?16. In stance, 180?  ?turn to R   ?  ?OTHOSTATICS: not done ? ? ?  PATIENT EDUCATION: ?Education details: Educated on POC/Eval Findings ?Person educated: Patient and Child(ren) ?Education method: Explanation ?Education comprehension: verbalized understanding ? ? ?GOALS: ?Goals reviewed with patient? Yes ? ?SHORT TERM GOALS: Target date: 05/08/2021 ? ?Pt will be independent with initial HEP for improved balance/vestibular ?Baseline: no HEP established ?Goal status: INITIAL ? ?2.  FGA TBA and LTG to be set as applicable ?Baseline: TBA ?Goal status: INITIAL ? ?3.  Pt will improve 5x STS to </= 20 sec and less than 7/10 pain in knee to demo improved functional LE strength and balance  ?Baseline: 25.50 secs with BUE support ?Goal status: INITIAL ? ? ?LONG TERM GOALS: Target date:  06/05/21 ? ?Pt will be independent with final HEP for improved balance/strength  ?Baseline: no HEP established ?Goal status: INITIAL ? ?2.  Pt will improve 5x STS to </= 15 sec and less than 5/10 pain in knee to demo improved functional LE strength and balance  ?Baseline: 25.50 secs ?Goal status: INITIAL ? ?3.  LTG to be set for FGA ?Baseline: TBA ?Goal status: INITIAL ? ?4.  Patient will report no dizziness with MSQ to demo improved activity tolerance for  bed mobility ?Baseline: 1/5 ?Goal status: INITIAL ? ?5.  Pt will report 50% improvement in R knee pain and report no recent falls ?Baseline: falls weekly, with constant pain ?Goal status: INITIAL ? ? ? ?ASSESS

## 2021-04-17 NOTE — Addendum Note (Signed)
Addended by: Baldomero Lamy B on: 04/17/2021 04:25 PM ? ? Modules accepted: Orders ? ?

## 2021-04-18 DIAGNOSIS — G43109 Migraine with aura, not intractable, without status migrainosus: Secondary | ICD-10-CM | POA: Diagnosis not present

## 2021-04-19 DIAGNOSIS — G43109 Migraine with aura, not intractable, without status migrainosus: Secondary | ICD-10-CM | POA: Diagnosis not present

## 2021-04-20 DIAGNOSIS — G43109 Migraine with aura, not intractable, without status migrainosus: Secondary | ICD-10-CM | POA: Diagnosis not present

## 2021-04-21 ENCOUNTER — Encounter: Payer: Self-pay | Admitting: Internal Medicine

## 2021-04-21 ENCOUNTER — Ambulatory Visit: Payer: Medicaid Other | Attending: Internal Medicine | Admitting: Internal Medicine

## 2021-04-21 ENCOUNTER — Other Ambulatory Visit: Payer: Self-pay

## 2021-04-21 VITALS — BP 130/78 | HR 68 | Resp 16 | Wt 150.4 lb

## 2021-04-21 DIAGNOSIS — R42 Dizziness and giddiness: Secondary | ICD-10-CM

## 2021-04-21 DIAGNOSIS — E1142 Type 2 diabetes mellitus with diabetic polyneuropathy: Secondary | ICD-10-CM

## 2021-04-21 DIAGNOSIS — R296 Repeated falls: Secondary | ICD-10-CM

## 2021-04-21 DIAGNOSIS — E039 Hypothyroidism, unspecified: Secondary | ICD-10-CM | POA: Diagnosis not present

## 2021-04-21 DIAGNOSIS — I10 Essential (primary) hypertension: Secondary | ICD-10-CM | POA: Diagnosis not present

## 2021-04-21 DIAGNOSIS — G43109 Migraine with aura, not intractable, without status migrainosus: Secondary | ICD-10-CM | POA: Diagnosis not present

## 2021-04-21 DIAGNOSIS — G8929 Other chronic pain: Secondary | ICD-10-CM | POA: Diagnosis not present

## 2021-04-21 DIAGNOSIS — M542 Cervicalgia: Secondary | ICD-10-CM | POA: Diagnosis not present

## 2021-04-21 DIAGNOSIS — Z23 Encounter for immunization: Secondary | ICD-10-CM | POA: Diagnosis not present

## 2021-04-21 DIAGNOSIS — J302 Other seasonal allergic rhinitis: Secondary | ICD-10-CM

## 2021-04-21 LAB — POCT GLYCOSYLATED HEMOGLOBIN (HGB A1C): HbA1c, POC (controlled diabetic range): 6.9 % (ref 0.0–7.0)

## 2021-04-21 LAB — GLUCOSE, POCT (MANUAL RESULT ENTRY): POC Glucose: 159 mg/dl — AB (ref 70–99)

## 2021-04-21 MED ORDER — VALSARTAN 40 MG PO TABS: 20.0000 mg | ORAL_TABLET | Freq: Every day | ORAL | 3 refills | Status: DC

## 2021-04-21 MED ORDER — ACCU-CHEK GUIDE VI STRP: ORAL_STRIP | 12 refills | Status: DC

## 2021-04-21 MED ORDER — LORATADINE 10 MG PO TABS: 10.0000 mg | ORAL_TABLET | Freq: Every day | ORAL | 3 refills | Status: DC

## 2021-04-21 NOTE — Progress Notes (Signed)
Just ? ? ?Patient ID: April Mcmahon, female    DOB: April 18, 1960  MRN: [id number] ? ?CC: Diabetes and Hypertension ? ? ?Subjective: ?April Mcmahon is a 61 y.o. female who presents for chronic ds management.  Daughter-in-law, Enis Gash interprets. ?Her concerns today include:  ?Pt with hx of DM neuropathy, HTN, HL, former tob dep, migraines, Vit D def, DJD knees and plantar fasciitis, Abn LFT with steatosis on U/S, BL CTS, hypothyroid ?  ? ?Patient seen in the ER last month for fall x2 associated with dizziness.  First episode occurred when she fell after standing up to get off the toilet.  Second episode occurred when she tripped and fell.  No LOC. ? ?Seen by our NP 03/13/2021 for ER follow-up for dizziness and fall.  She was referred to PT for vestibular rehab.  X-ray of the neck and lumbar spine were done because of complaints of pain.  Both were negative for any acute fractures. ?-started P.T. Reports dizziness during the session. ?-reports intermittent dizziness that last a few minutes for past 2 mths.  Occurs with position changes like head movement, rolling in bed.  Dizziness can also occur when she is standing up or walking. ?-Reports that she continues to have pain in the neck that radiates to the shoulders on both sides and down the back.  No numbness or tingling. Tylenol and gabapentin helps but wonders why pain still present.. ? ?On last visit she complained of daily headaches.  Hx of migraines for which she had seen neurologist Dr. Krista Blue in the past.  I started her on Imitrex and Topamax for migraines.  Reports no HA in past 1 mth. ? ?DM: ?Results for orders placed or performed in visit on 04/21/21  ?POCT glucose (manual entry)  ?Result Value Ref Range  ? POC Glucose 159 (A) 70 - 99 mg/dl  ?POCT glycosylated hemoglobin (Hb A1C)  ?Result Value Ref Range  ? Hemoglobin A1C    ? HbA1c POC (<> result, manual entry)    ? HbA1c, POC (prediabetic range)    ? HbA1c, POC (controlled diabetic range) 6.9 0.0 - 7.0 %  ? ?Last  saw Dr. Leonette Monarch 01/16/2021.  A1c at that time was 7.  Patient continued on Jardiance 25 mg, metformin and Lantus 12 units. ?Checks BS BID.  BS in a.m 100-110.  Evenings up to 150.  No Low BS ? ?HTN: Reports compliance with Norvasc and Valsartan.  Does not have bottle of the latter with her but states she does have it at home.  Forgot to bring it with the other meds. ? ?Thyroid Ds:  taking and tolerating Levothyroxine as prescribed. ? ?Complains of allergy symptoms of sneezing, itchy watery eyes and itchy throat.  She has Flonase nasal spray.  Not taking an allergy pill. ? ?Patient Active Problem List  ? Diagnosis Date Noted  ? Dyslipidemia 10/17/2020  ? Uncontrolled type 2 diabetes mellitus with peripheral neuropathy 10/17/2020  ? Hyperlipidemia associated with type 2 diabetes mellitus ([place]) 05/05/2020  ? Influenza vaccine needed 01/03/2020  ? Former smoker 01/03/2020  ? Acquired hypothyroidism 01/03/2020  ? Microalbuminuria 11/15/2018  ? Muscle cramps 02/21/2018  ? Hepatic steatosis 12/23/2017  ? MCI (mild cognitive impairment) 02/14/2017  ? Diabetic peripheral neuropathy ([place]) 02/14/2017  ? Memory loss 12/27/2016  ? Dysfunctional uterine bleeding 08/30/2016  ? Analgesic rebound headache 06/24/2016  ? Esophageal dysphagia 06/21/2016  ? Tobacco dependence 01/20/2016  ? Plantar fasciitis, bilateral 12/24/2015  ? DJD (degenerative joint disease) of knee 06/20/2015  ?  Type 2 diabetes mellitus with peripheral neuropathy ([place]) 06/19/2015  ? Essential hypertension 06/19/2015  ? Vitamin D deficiency 09/24/2013  ? Migraine 05/15/2013  ? Low back pain 08/18/2012  ?  ? ?Current Outpatient Medications on File Prior to Visit  ?Medication Sig Dispense Refill  ? Accu-Chek Softclix Lancets lancets Check blood sugar three times daily. Dx E11.42 100 each 12  ? amLODipine (NORVASC) 10 MG tablet Take 1 tablet (10 mg total) by mouth daily. 30 tablet 11  ? atorvastatin (LIPITOR) 20 MG tablet Take 1 tablet (20 mg total) by mouth  daily. 90 tablet 3  ? Blood Glucose Monitoring Suppl (ACCU-CHEK GUIDE ME) w/Device KIT Check blood sugar three times daily. Dx E11.42 1 kit 0  ? diclofenac Sodium (VOLTAREN) 1 % GEL Apply 2 g topically 4 (four) times daily. 100 g 3  ? empagliflozin (JARDIANCE) 25 MG TABS tablet Take 1 tablet (25 mg total) by mouth daily before breakfast. 90 tablet 3  ? fluticasone (FLONASE) 50 MCG/ACT nasal spray Place 2 sprays into both nostrils daily. 16 g 0  ? gabapentin (NEURONTIN) 600 MG tablet Take 0.5 tablets (300 mg total) by mouth 3 (three) times daily. 45 tablet 1  ? insulin glargine (LANTUS SOLOSTAR) 100 UNIT/ML Solostar Pen Inject 14 Units into the skin daily. 15 mL 6  ? Insulin Pen Needle 32G X 4 MM MISC 1 Device by Does not apply route daily in the afternoon. 100 each 3  ? levothyroxine (SYNTHROID) 50 MCG tablet TAKE 1 TABLET BY MOUTH EVERY DAY 90 tablet 2  ? meloxicam (MOBIC) 15 MG tablet Take 1 tablet (15 mg total) by mouth daily. For arthritis pain 30 tablet 6  ? metFORMIN (GLUCOPHAGE) 1000 MG tablet Take 1 tablet (1,000 mg total) by mouth 2 (two) times daily. 180 tablet 3  ? SUMAtriptan (IMITREX) 50 MG tablet Take 1 tab at the start of the headache.  May repeat in 2 hours if headache does not completely resolve.  Max 2 tabs/24 hr. 10 tablet 2  ? [DISCONTINUED] venlafaxine (EFFEXOR) 37.5 MG tablet Take 1 tablet (37.5 mg total) by mouth 2 (two) times daily with a meal. 60 tablet 11  ? ?No current facility-administered medications on file prior to visit.  ? ? ?Allergies  ?Allergen Reactions  ? Elavil [Amitriptyline Hcl] Diarrhea and Nausea And Vomiting  ? ? ?Social History  ? ?Socioeconomic History  ? Marital status: Married  ?  Spouse name: Not on file  ? Number of children: Not on file  ? Years of education: Not on file  ? Highest education level: Not on file  ?Occupational History  ? Not on file  ?Tobacco Use  ? Smoking status: Former  ? Smokeless tobacco: Never  ?Vaping Use  ? Vaping Use: Never used  ?Substance  and Sexual Activity  ? Alcohol use: No  ? Drug use: No  ? Sexual activity: Not on file  ?Other Topics Concern  ? Not on file  ?Social History Narrative  ? Not on file  ? ?Social Determinants of Health  ? ?Financial Resource Strain: Not on file  ?Food Insecurity: Not on file  ?Transportation Needs: Not on file  ?Physical Activity: Not on file  ?Stress: Not on file  ?Social Connections: Not on file  ?Intimate Partner Violence: Not on file  ? ? ?Family History  ?Problem Relation Age of Onset  ? Migraines Mother   ? Esophageal cancer Other   ? Breast cancer Neg Hx   ? Colon  cancer Neg Hx   ? Rectal cancer Neg Hx   ? Stomach cancer Neg Hx   ? ? ?Past Surgical History:  ?Procedure Laterality Date  ? no previous surgeries    ? ? ?ROS: ?Review of Systems ?Negative except as stated above ? ?PHYSICAL EXAM: ?BP 130/78   Pulse 68   Resp 16   Wt 150 lb 6.4 oz (68.2 kg)   SpO2 100%   BMI 25.82 kg/m?   ?Physical Exam ?Sitting: BP 134/76, pulse 70 ?Standing BP 131/76, pulse 68 ?General appearance - alert, well appearing, and in no distress ?Mental status - normal mood, behavior, speech, dress, motor activity, and thought processes ?Mouth - mucous membranes moist, pharynx normal without lesions ?Neck - supple, no significant adenopathy ?Chest - clear to auscultation, no wheezes, rales or rhonchi, symmetric air entry ?Heart - normal rate, regular rhythm, normal S1, S2, no murmurs, rubs, clicks or gallops ?Neurological - cranial nerves II through XII intact, motor and sensory grossly normal bilaterally, normal muscle tone, no tremors, strength 5/5. ?No point tenderness on palpation of paraspinal neck muscles.  Neck supple.  ?Extremities - peripheral pulses normal, no pedal edema, no clubbing or cyanosis ?Diabetic Foot Exam - Simple   ?Simple Foot Form ?Visual Inspection ?No deformities, no ulcerations, no other skin breakdown bilaterally: Yes ?Sensation Testing ?Intact to touch and monofilament testing bilaterally: Yes ?Pulse  Check ?Posterior Tibialis and Dorsalis pulse intact bilaterally: Yes ?Comments ?  ? ? ? ?CMP Latest Ref Rng & Units 12/22/2020 05/09/2020 10/03/2019  ?Glucose 70 - 99 mg/dL 90 302(H) 123(H)  ?BUN 6 - 24 mg/dL 10 6 6

## 2021-04-21 NOTE — Patient Instructions (Signed)
Stop topiramate.  This was the headache medication that you are taking at bedtime.  I have referred you to the neurologist. ? ?I have ordered a CAT scan of the head and an MRI of your neck. ?

## 2021-04-22 DIAGNOSIS — G43109 Migraine with aura, not intractable, without status migrainosus: Secondary | ICD-10-CM | POA: Diagnosis not present

## 2021-04-22 LAB — TSH: TSH: 4.85 u[IU]/mL — ABNORMAL HIGH (ref 0.450–4.500)

## 2021-04-23 DIAGNOSIS — G43109 Migraine with aura, not intractable, without status migrainosus: Secondary | ICD-10-CM | POA: Diagnosis not present

## 2021-04-24 DIAGNOSIS — G43109 Migraine with aura, not intractable, without status migrainosus: Secondary | ICD-10-CM | POA: Diagnosis not present

## 2021-04-25 DIAGNOSIS — G43109 Migraine with aura, not intractable, without status migrainosus: Secondary | ICD-10-CM | POA: Diagnosis not present

## 2021-04-26 DIAGNOSIS — G43109 Migraine with aura, not intractable, without status migrainosus: Secondary | ICD-10-CM | POA: Diagnosis not present

## 2021-04-27 ENCOUNTER — Telehealth: Payer: Self-pay

## 2021-04-27 ENCOUNTER — Ambulatory Visit: Payer: Medicaid Other | Admitting: Physical Therapy

## 2021-04-27 DIAGNOSIS — G43109 Migraine with aura, not intractable, without status migrainosus: Secondary | ICD-10-CM | POA: Diagnosis not present

## 2021-04-27 NOTE — Telephone Encounter (Signed)
Started prior auth for MRI and CT  that is scheduled for 3/30. Sent prior auth form and office note to [employer]  ?

## 2021-04-28 DIAGNOSIS — G43109 Migraine with aura, not intractable, without status migrainosus: Secondary | ICD-10-CM | POA: Diagnosis not present

## 2021-04-29 ENCOUNTER — Telehealth: Payer: Self-pay

## 2021-04-29 DIAGNOSIS — G43109 Migraine with aura, not intractable, without status migrainosus: Secondary | ICD-10-CM | POA: Diagnosis not present

## 2021-04-29 DIAGNOSIS — G8929 Other chronic pain: Secondary | ICD-10-CM

## 2021-04-29 NOTE — Telephone Encounter (Addendum)
Faxed prior auth form on 3/27 to [facility]. Received a fax from [facility] [place] in regards to pt prior auth. Will need to initiate prior auth through [place].  ? ?Contacted [employer] and spoke to intake rep and started prior auth.  ? ?Per intake rep CT and MRI does not meet criteria. Will need to have provider do peer to peer.  ? ? ?Dr. Wynetta Emery may you please call and do peer to peer. Pt appointment is schedule 3/30 and will need to have prior auth authorized before 1pm. ? ?Case #[id number]  ?[phone] ?

## 2021-04-29 NOTE — Telephone Encounter (Signed)
Contacted pt daughter and made aware of provider message pt daughter states she would like referral placed to PT ?

## 2021-04-29 NOTE — Addendum Note (Signed)
Addended by: Karle Plumber B on: 04/29/2021 12:13 PM ? ? Modules accepted: Orders ? ?

## 2021-04-30 ENCOUNTER — Ambulatory Visit (HOSPITAL_COMMUNITY)
Admission: RE | Admit: 2021-04-30 | Discharge: 2021-04-30 | Disposition: A | Discharge status: Home / Self Care | Payer: Medicaid Other | Source: Ambulatory Visit | Attending: Internal Medicine | Admitting: Internal Medicine

## 2021-04-30 DIAGNOSIS — M542 Cervicalgia: Secondary | ICD-10-CM | POA: Insufficient documentation

## 2021-04-30 DIAGNOSIS — G8929 Other chronic pain: Secondary | ICD-10-CM | POA: Insufficient documentation

## 2021-04-30 DIAGNOSIS — G43109 Migraine with aura, not intractable, without status migrainosus: Secondary | ICD-10-CM | POA: Diagnosis not present

## 2021-04-30 DIAGNOSIS — R42 Dizziness and giddiness: Secondary | ICD-10-CM | POA: Insufficient documentation

## 2021-04-30 DIAGNOSIS — R296 Repeated falls: Secondary | ICD-10-CM | POA: Diagnosis present

## 2021-05-01 DIAGNOSIS — G43109 Migraine with aura, not intractable, without status migrainosus: Secondary | ICD-10-CM | POA: Diagnosis not present

## 2021-05-02 ENCOUNTER — Telehealth: Payer: Self-pay | Admitting: Internal Medicine

## 2021-05-02 DIAGNOSIS — G8929 Other chronic pain: Secondary | ICD-10-CM

## 2021-05-02 DIAGNOSIS — G43109 Migraine with aura, not intractable, without status migrainosus: Secondary | ICD-10-CM | POA: Diagnosis not present

## 2021-05-02 NOTE — Telephone Encounter (Signed)
Phone call placed to patient's daughter-in-law Enis Gash today to go over the results of CAT scan of the head and MRI of the neck.  I informed her that the CAT scan of the patient's head came back okay. ?Explained to her that the insurance approved and will pay for the CAT scan of the head but did not approve for and will not pay for the MRI of the neck.  It was intended to be canceled.  However the order was still in the system at the time that she went to get the CAT scan of the head so it was done.  Daughter-in-law stated that they will pay for MRI. Advised that the MRI showed some bulging disks in the neck with narrowing around the spinal cord at some levels that we call spinal stenosis.  Also showed arthritis changes.  I inquired whether the patient was still having pain in the neck.  Daughter-in-law states she was still having significant pain.  I told her that I had referred her for physical therapy but given the MRI findings I will also send her to a spine specialist to take a look at it.  She was agreeable to this. ?

## 2021-05-03 DIAGNOSIS — G43109 Migraine with aura, not intractable, without status migrainosus: Secondary | ICD-10-CM | POA: Diagnosis not present

## 2021-05-04 ENCOUNTER — Ambulatory Visit: Payer: Medicaid Other | Attending: Nurse Practitioner | Admitting: Physical Therapy

## 2021-05-04 ENCOUNTER — Encounter: Payer: Self-pay | Admitting: Physical Therapy

## 2021-05-04 DIAGNOSIS — G8929 Other chronic pain: Secondary | ICD-10-CM

## 2021-05-04 DIAGNOSIS — R42 Dizziness and giddiness: Secondary | ICD-10-CM

## 2021-05-04 DIAGNOSIS — R293 Abnormal posture: Secondary | ICD-10-CM | POA: Diagnosis not present

## 2021-05-04 DIAGNOSIS — R262 Difficulty in walking, not elsewhere classified: Secondary | ICD-10-CM | POA: Diagnosis not present

## 2021-05-04 DIAGNOSIS — R2681 Unsteadiness on feet: Secondary | ICD-10-CM | POA: Diagnosis not present

## 2021-05-04 DIAGNOSIS — M6281 Muscle weakness (generalized): Secondary | ICD-10-CM | POA: Insufficient documentation

## 2021-05-04 DIAGNOSIS — M542 Cervicalgia: Secondary | ICD-10-CM | POA: Insufficient documentation

## 2021-05-04 DIAGNOSIS — G43109 Migraine with aura, not intractable, without status migrainosus: Secondary | ICD-10-CM | POA: Diagnosis not present

## 2021-05-04 DIAGNOSIS — M25561 Pain in right knee: Secondary | ICD-10-CM | POA: Diagnosis not present

## 2021-05-04 NOTE — Therapy (Signed)
?OUTPATIENT PHYSICAL THERAPY VESTIBULAR TREATMENT NOTE ? ? ?Patient Name: April Mcmahon ?MRN: [id number] ?DOB:August 22, 1960, 61 y.o., female ?Today's Date: 05/04/2021 ? ?PCP: Ladell Pier, MD ?REFERRING PROVIDER: Gildardo Pounds, NP ? ? PT End of Session - 05/04/21 1452   ? ? Visit Number 2   ? Number of Visits 7   ? Date for PT Re-Evaluation 06/05/21   ? Authorization Type [employer] M[employer] (VL: 27)   ? Authorization Time Period awaiting authorization   ? PT Start Time 1450   ? PT Stop Time 1530   ? PT Time Calculation (min) 40 min   ? Activity Tolerance Patient tolerated treatment well   ? Behavior During Therapy [facility] for tasks assessed/performed   ? ?  ?  ? ?  ? ? ?Past Medical History:  ?Diagnosis Date  ? Bilateral chronic knee pain 06/2014  ? Chronic low back pain 06/2014  ? Chronic migraine 02/1985  ? Depression   ? hx of  ? Diabetes mellitus without complication ([place]) 24/5809  ? Fall   ? Hyperlipidemia   ? Hypertension 09/2013  ? Shoulder pain   ? ?Past Surgical History:  ?Procedure Laterality Date  ? no previous surgeries    ? ?Patient Active Problem List  ? Diagnosis Date Noted  ? Dyslipidemia 10/17/2020  ? Uncontrolled type 2 diabetes mellitus with peripheral neuropathy 10/17/2020  ? Hyperlipidemia associated with type 2 diabetes mellitus ([place]) 05/05/2020  ? Influenza vaccine needed 01/03/2020  ? Former smoker 01/03/2020  ? Acquired hypothyroidism 01/03/2020  ? Microalbuminuria 11/15/2018  ? Muscle cramps 02/21/2018  ? Hepatic steatosis 12/23/2017  ? MCI (mild cognitive impairment) 02/14/2017  ? Diabetic peripheral neuropathy ([place]) 02/14/2017  ? Memory loss 12/27/2016  ? Dysfunctional uterine bleeding 08/30/2016  ? Analgesic rebound headache 06/24/2016  ? Esophageal dysphagia 06/21/2016  ? Tobacco dependence 01/20/2016  ? Plantar fasciitis, bilateral 12/24/2015  ? DJD (degenerative joint disease) of knee 06/20/2015  ? Type 2 diabetes mellitus with peripheral neuropathy ([place]) 06/19/2015  ? Essential  hypertension 06/19/2015  ? Vitamin D deficiency 09/24/2013  ? Migraine 05/15/2013  ? Low back pain 08/18/2012  ? ? ?ONSET DATE: 03/13/21 (Referral Date)  ? ?REFERRING DIAG: R29.6 (ICD-10-CM) - Frequent falls, R42 (ICD-10-CM) - Dizziness ? ?THERAPY DIAG:  ?Unsteadiness on feet ? ?Dizziness and giddiness ? ?Chronic pain of right knee ? ?Difficulty in walking, not elsewhere classified ? ?PERTINENT HISTORY: B Chronic Knee Pain, Chronic LBP, Migraines, Depression, DM, Falls, HLD, HTN ? ?PRECAUTIONS: Fall ? ?SUBJECTIVE: Having neck pain. No falls.  ? ?Pt's daughter in law interpreting today.  ? ?PAIN:  ?Are you having pain? Yes: NPRS scale: 10/10 ?Pain location: bilat neck and shoulders ?Pain description: Cramping ?Aggravating factors: Not taking Gabapentin ?Relieving factors: Gabapentin ? ? ? ?OBJECTIVE:  ? ? [facility] PT Assessment - 05/04/21 1456   ? ?  ? Functional Gait  Assessment  ? Gait assessed  Yes   ? Gait Level Surface Walks 20 ft, slow speed, abnormal gait pattern, evidence for imbalance or deviates 10-15 in outside of the 12 in walkway width. Requires more than 7 sec to ambulate 20 ft.   9.43  ? Change in Gait Speed Able to smoothly change walking speed without loss of balance or gait deviation. Deviate no more than 6 in outside of the 12 in walkway width.   ? Gait with Horizontal Head Turns Performs head turns smoothly with slight change in gait velocity (eg, minor disruption to smooth gait path),  deviates 6-10 in outside 12 in walkway width, or uses an assistive device.   5-6/10 diziness  ? Gait with Vertical Head Turns Performs task with moderate change in gait velocity, slows down, deviates 10-15 in outside 12 in walkway width but recovers, can continue to walk.   pt reporting 10/10 dizziness  ? Gait and Pivot Turn Pivot turns safely within 3 sec and stops quickly with no loss of balance.   ? Step Over Obstacle Is able to step over one shoe box (4.5 in total height) without changing gait speed. No evidence of  imbalance.   ? Gait with Narrow Base of Support Ambulates less than 4 steps heel to toe or cannot perform without assistance.   ? Gait with Eyes Closed Cannot walk 20 ft without assistance, severe gait deviations or imbalance, deviates greater than 15 in outside 12 in walkway width or will not attempt task.   15.63  ? Ambulating Backwards Walks 20 ft, slow speed, abnormal gait pattern, evidence for imbalance, deviates 10-15 in outside 12 in walkway width.   33.5 sec  ? Steps Two feet to a stair, must use rail.   ? Total Score 14   ? FGA comment: 14/30 = high fall risk   ? ?  ?  ? ?  ? ? ?VESTIBULAR TREATMENT: ? ?   ?Gaze Adaptation: ?x1 Viewing Horizontal: Position: seated with feet supported,  Reps: 2 x 5 reps (pt reporting mod-severe dizziness) and x3 reps (pt able to tolerate this better with 3 reps) ?x1 Viewing Vertical:  Position: seated with feet supported. Reps: 3 sets of 5 reps and Comment: mild/mod dizziness.  ? ?Cues initially needed for pt to keep eyes on the target the whole time.  ? ?  ? ?HEP: ?Access Code: [email or web address] ?URL: [email or web address] ?Date: 05/04/2021 ?Prepared by: Janann August ? ?Exercises ?- Romberg Stance with Eyes Closed  - 1 x daily - 5 x weekly - 3 sets - 30 hold ? ?Seated VOR x1 (see pt instructions for more details). ?  ?PATIENT EDUCATION: ?Education details: Initial HEP, results of FGA. Pt with new referral for neck pain. Discussed that if pt is going to be treated for neck pain she would be better suited at an ortho clinic (pt is already being seen for balance, dizziness, and knee pain at this clinic) and provided information for pt's daughter to reach out to get pt scheduled.  ?Person educated: Patient and Child(ren) ?Education method: Explanation ?Education comprehension: verbalized understanding ?  ?  ?GOALS: ?Goals reviewed with patient? Yes ?  ?SHORT TERM GOALS: Target date: 05/08/2021 ?  ?Pt will be independent with initial HEP for improved  balance/vestibular ?Baseline: no HEP established ?Goal status: INITIAL ?  ?2.  FGA TBA and LTG to be set as applicable ?Baseline: 14/30 ?Goal status: MET ?  ?3.  Pt will improve 5x STS to </= 20 sec and less than 7/10 pain in knee to demo improved functional LE strength and balance  ?Baseline: 25.50 secs with BUE support ?Goal status: INITIAL ?  ?  ?LONG TERM GOALS: Target date:  06/05/21 ?  ?Pt will be independent with final HEP for improved balance/strength  ?Baseline: no HEP established ?Goal status: INITIAL ?  ?2.  Pt will improve 5x STS to </= 15 sec and less than 5/10 pain in knee to demo improved functional LE strength and balance  ?Baseline: 25.50 secs ?Goal status: INITIAL ?  ?3.  Pt will improve FGA to at least a 18/30  in order to demo decr fall risk.  ?Baseline: 14/30 ?Goal status: INITIAL ?  ?4.  Patient will report no dizziness with MSQ to demo improved activity tolerance for bed mobility ?Baseline: 1/5 ?Goal status: INITIAL ?  ?5.  Pt will report 50% improvement in R knee pain and report no recent falls ?Baseline: falls weekly, with constant pain ?Goal status: INITIAL ?  ?  ?  ?ASSESSMENT: ?  ?CLINICAL IMPRESSION: ?Performed the FGA with pt scoring a 14/30, indicating a high fall risk. LTG updated. Focused on initiating HEP for standing balance and seated VOR x1. Pt able to perform VOR x1 slowly for 3-5 reps and had moderate dizziness that was able to subside with rest. Pt wearing platform flip flops to session, asked if pt had any other closed toe shoes to wear for balance and for safety, but pt does not. Will continue to progress towards LTGs.  ? ?  ?  ?  ?OBJECTIVE IMPAIRMENTS Abnormal gait, decreased activity tolerance, decreased balance, difficulty walking, decreased strength, dizziness, postural dysfunction, and pain.  ?  ?ACTIVITY LIMITATIONS cleaning and meal prep.  ?  ?PERSONAL FACTORS 3+ comorbidities: B Chronic Knee Pain, Chronic LBP, Migraines, Depression, DM, Falls, HLD, HTN  are also  affecting patient's functional outcome.  ?  ?  ?REHAB POTENTIAL: Good ?  ?CLINICAL DECISION MAKING: Stable/uncomplicated ?  ?EVALUATION COMPLEXITY: Low ?  ?  ?PLAN: ?PT FREQUENCY: 1x/week ?  ?PT DURATION: 6 weeks ?  ?PLANNED INTERVENTI

## 2021-05-04 NOTE — Patient Instructions (Addendum)
Gaze Stabilization: Sitting ? ? ? ?Keeping eyes on target on wall a few feet away, tilt head down 15-30? and move head side to side slowly for 3-5 times.  ? ?Repeat while moving head up and down 5 times. Perform slowly.  ?Do __2-3__ sessions per day. ? ?Wait for symptoms to subside between each time  ? ?Copyright ? VHI. All rights reserved.  ? ? ?Access Code: [email or web address] ?URL: [email or web address] ?Date: 05/04/2021 ?Prepared by: Janann August ? ?Exercises ?- Romberg Stance with Eyes Closed  - 1 x daily - 5 x weekly - 3 sets - 30 hold ?

## 2021-05-05 DIAGNOSIS — G43109 Migraine with aura, not intractable, without status migrainosus: Secondary | ICD-10-CM | POA: Diagnosis not present

## 2021-05-06 DIAGNOSIS — G43109 Migraine with aura, not intractable, without status migrainosus: Secondary | ICD-10-CM | POA: Diagnosis not present

## 2021-05-07 DIAGNOSIS — G43109 Migraine with aura, not intractable, without status migrainosus: Secondary | ICD-10-CM | POA: Diagnosis not present

## 2021-05-08 DIAGNOSIS — G43109 Migraine with aura, not intractable, without status migrainosus: Secondary | ICD-10-CM | POA: Diagnosis not present

## 2021-05-09 DIAGNOSIS — G43109 Migraine with aura, not intractable, without status migrainosus: Secondary | ICD-10-CM | POA: Diagnosis not present

## 2021-05-10 DIAGNOSIS — G43109 Migraine with aura, not intractable, without status migrainosus: Secondary | ICD-10-CM | POA: Diagnosis not present

## 2021-05-11 ENCOUNTER — Ambulatory Visit: Payer: Medicaid Other | Admitting: Physical Therapy

## 2021-05-11 DIAGNOSIS — M50222 Other cervical disc displacement at C5-C6 level: Secondary | ICD-10-CM | POA: Diagnosis not present

## 2021-05-11 DIAGNOSIS — M50223 Other cervical disc displacement at C6-C7 level: Secondary | ICD-10-CM | POA: Diagnosis not present

## 2021-05-11 DIAGNOSIS — I10 Essential (primary) hypertension: Secondary | ICD-10-CM | POA: Diagnosis not present

## 2021-05-11 DIAGNOSIS — M4722 Other spondylosis with radiculopathy, cervical region: Secondary | ICD-10-CM | POA: Diagnosis not present

## 2021-05-11 DIAGNOSIS — Z6828 Body mass index (BMI) 28.0-28.9, adult: Secondary | ICD-10-CM | POA: Diagnosis not present

## 2021-05-11 DIAGNOSIS — G43109 Migraine with aura, not intractable, without status migrainosus: Secondary | ICD-10-CM | POA: Diagnosis not present

## 2021-05-12 DIAGNOSIS — G43109 Migraine with aura, not intractable, without status migrainosus: Secondary | ICD-10-CM | POA: Diagnosis not present

## 2021-05-13 ENCOUNTER — Telehealth: Payer: Self-pay | Admitting: Internal Medicine

## 2021-05-13 DIAGNOSIS — G43109 Migraine with aura, not intractable, without status migrainosus: Secondary | ICD-10-CM | POA: Diagnosis not present

## 2021-05-13 NOTE — Telephone Encounter (Signed)
Sent pt a Mychart message

## 2021-05-14 DIAGNOSIS — G43109 Migraine with aura, not intractable, without status migrainosus: Secondary | ICD-10-CM | POA: Diagnosis not present

## 2021-05-15 DIAGNOSIS — G43109 Migraine with aura, not intractable, without status migrainosus: Secondary | ICD-10-CM | POA: Diagnosis not present

## 2021-05-16 DIAGNOSIS — G43109 Migraine with aura, not intractable, without status migrainosus: Secondary | ICD-10-CM | POA: Diagnosis not present

## 2021-05-17 DIAGNOSIS — G43109 Migraine with aura, not intractable, without status migrainosus: Secondary | ICD-10-CM | POA: Diagnosis not present

## 2021-05-18 ENCOUNTER — Encounter: Payer: Self-pay | Admitting: Physical Therapy

## 2021-05-18 ENCOUNTER — Ambulatory Visit: Payer: Medicaid Other | Admitting: Physical Therapy

## 2021-05-18 ENCOUNTER — Other Ambulatory Visit: Payer: Self-pay

## 2021-05-18 DIAGNOSIS — R293 Abnormal posture: Secondary | ICD-10-CM | POA: Diagnosis not present

## 2021-05-18 DIAGNOSIS — R2681 Unsteadiness on feet: Secondary | ICD-10-CM | POA: Diagnosis not present

## 2021-05-18 DIAGNOSIS — R42 Dizziness and giddiness: Secondary | ICD-10-CM | POA: Diagnosis not present

## 2021-05-18 DIAGNOSIS — M6281 Muscle weakness (generalized): Secondary | ICD-10-CM | POA: Diagnosis not present

## 2021-05-18 DIAGNOSIS — M542 Cervicalgia: Secondary | ICD-10-CM

## 2021-05-18 DIAGNOSIS — R262 Difficulty in walking, not elsewhere classified: Secondary | ICD-10-CM | POA: Diagnosis not present

## 2021-05-18 DIAGNOSIS — G8929 Other chronic pain: Secondary | ICD-10-CM | POA: Diagnosis not present

## 2021-05-18 DIAGNOSIS — M25561 Pain in right knee: Secondary | ICD-10-CM | POA: Diagnosis not present

## 2021-05-18 DIAGNOSIS — G43109 Migraine with aura, not intractable, without status migrainosus: Secondary | ICD-10-CM | POA: Diagnosis not present

## 2021-05-18 NOTE — Therapy (Signed)
?OUTPATIENT PHYSICAL THERAPY RE-EVALUATION ? ? ?Patient Name: April Mcmahon ?MRN: [id number] ?DOB:1960-07-26, 61 y.o., female ?Today's Date: 05/18/2021 ? ? PT End of Session - 05/18/21 1710   ? ? Visit Number 3   ? Number of Visits 7   ? Date for PT Re-Evaluation 06/05/21   ? Authorization Type [employer]   ? Authorization Time Period awaiting authorization   ? PT Start Time 1702   ? PT Stop Time 7341   ? PT Time Calculation (min) 43 min   ? Activity Tolerance Patient limited by pain   ? Behavior During Therapy [facility] for tasks assessed/performed   ? ?  ?  ? ?  ? ? ?Past Medical History:  ?Diagnosis Date  ? Bilateral chronic knee pain 06/2014  ? Chronic low back pain 06/2014  ? Chronic migraine 02/1985  ? Depression   ? hx of  ? Diabetes mellitus without complication ([place]) 93/7902  ? Fall   ? Hyperlipidemia   ? Hypertension 09/2013  ? Shoulder pain   ? ?Past Surgical History:  ?Procedure Laterality Date  ? no previous surgeries    ? ?Patient Active Problem List  ? Diagnosis Date Noted  ? Dyslipidemia 10/17/2020  ? Uncontrolled type 2 diabetes mellitus with peripheral neuropathy 10/17/2020  ? Hyperlipidemia associated with type 2 diabetes mellitus ([place]) 05/05/2020  ? Influenza vaccine needed 01/03/2020  ? Former smoker 01/03/2020  ? Acquired hypothyroidism 01/03/2020  ? Microalbuminuria 11/15/2018  ? Muscle cramps 02/21/2018  ? Hepatic steatosis 12/23/2017  ? MCI (mild cognitive impairment) 02/14/2017  ? Diabetic peripheral neuropathy ([place]) 02/14/2017  ? Memory loss 12/27/2016  ? Dysfunctional uterine bleeding 08/30/2016  ? Analgesic rebound headache 06/24/2016  ? Esophageal dysphagia 06/21/2016  ? Tobacco dependence 01/20/2016  ? Plantar fasciitis, bilateral 12/24/2015  ? DJD (degenerative joint disease) of knee 06/20/2015  ? Type 2 diabetes mellitus with peripheral neuropathy ([place]) 06/19/2015  ? Essential hypertension 06/19/2015  ? Vitamin D deficiency 09/24/2013  ? Migraine 05/15/2013  ? Low back pain  08/18/2012  ? ? ?PCP: Ladell Pier, MD ? ?REFERRING PROVIDER: Ladell Pier, MD ? ?REFERRING DIAG: Neck pain, chronic ? ?THERAPY DIAG:  ?Cervicalgia ? ?Muscle weakness (generalized) ? ?Abnormal posture ? ?ONSET DATE: chronic pain ? ? ?SUBJECTIVE:      ?SUBJECTIVE STATEMENT: ?Patient reports neck pain for years. She did have therapy a few years ago and she feels that helped at the time, but the neck pain has been worsening again. Her neck pain is constant and can cause headaches. She also notes L > R shoulder pain and difficulty raising her arms. She can take medication to help with the pain but the pain is always there not matter what she is doing. Denies any radicular symptoms. ? ?PERTINENT HISTORY:  ?Chronic neck and low back pain, Migraines, Depression, DM, Falls, HLD, HTN ? ?PAIN:  ?Are you having pain? Yes:  ?NPRS scale: 7-8/10 (10/10 without medication) ?Pain location: Neck ?Pain description: Constant, pounding, aching ?Aggravating factors: "always hurts" ?Relieving factors: Medication ? ?PRECAUTIONS: Fall ? ?WEIGHT BEARING RESTRICTIONS No ? ?FALLS:  ?Has patient fallen in last 6 months? Yes. Number of falls : patient reports a lot of falls, none since starting therapy ? ?LIVING ENVIRONMENT: ?Lives with: lives with their family ?Lives in: House/apartment ?Stairs: Yes: Internal: basement ?Has following equipment at home: Single point cane and Walker - 2 wheeled ? ?OCCUPATION: None ? ?PLOF: Independent with household mobility with device ? ?PATIENT GOALS: Pain relief ? ? ?  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 ?PATIENT SURVEYS:  ?Not assessed due to language barrier ? ?COGNITION: ?Overall cognitive status: Within functional limits for tasks assessed ? ?SENSATION: ?WFL ? ?POSTURE:  ?Rounded shoulders and forward head ? ? PALPATION: ?Exquisite  tenderness bilateral upper traps and cervical paraspinals with palpable trigger points noted  ? ?CERVICAL ROM:  ? ?Active ROM A/PROM (deg) ?05/18/2021  ?Flexion 45  ?Extension 35  ?Right lateral flexion 25  ?Left lateral flexion 25  ?Right rotation 35  ?Left rotation 40  ?*pain noted in all directions of cervical movement ? ?UE ROM: ? ?AROM Right ?05/18/2021 Left ?05/18/2021  ?Shoulder flexion 110 95  ? ? ?UE MMT: ? ?MMT Right ?05/18/2021 Left ?05/18/2021  ?Shoulder flexion 4 4-  ?Shoulder extension 4 4  ?Shoulder abduction 4 4-  ?Shoulder internal rotation 4 4  ?Shoulder external rotation 4 4-  ?Periscapular musculature 4- 4-  ? ?CERVICAL SPECIAL TESTS:  ?Neck flexor muscle endurance test: 5 seconds ?Spurling's test: Negative ? ? ?TODAY'S TREATMENT:  ?Seated chin tuck with finger assist on chin 5 x 5 sec ?Seated upper trap and levator scap stretch 2 x 15 sec each ?Seated row with red x 10 ?Seated double ER and scap retraction with yellow x 10 ?Instruction of SMFR using tennis ball for upper trap regions ? ?PATIENT EDUCATION:  ?Education details: Exam findings, POC, HEP, dry needling for future appointments ?Person educated: Patient ?Education method: Explanation, Demonstration, Tactile cues, Verbal cues, and Handouts ?Education comprehension: verbalized understanding, returned demonstration, verbal cues required, tactile cues required, and needs further education ? ?HOME EXERCISE PROGRAM: ?Access Code: [email or web address] ? ? ?ASSESSMENT: ?CLINICAL IMPRESSION: ?Patient is a 61 y.o. female who was seen today for physical therapy evaluation and treatment for chronic neck and bilateral shoulder pain.  ? ? ?OBJECTIVE IMPAIRMENTS decreased activity tolerance, decreased ROM, decreased strength, increased muscle spasms, postural dysfunction, and pain.  ? ?ACTIVITY LIMITATIONS cleaning, community activity, meal prep, laundry, and shopping.  ? ?PERSONAL FACTORS Fitness, Past/current experiences, and Time since onset of  injury/illness/exacerbation are also affecting patient's functional outcome.  ? ? ?REHAB POTENTIAL: Good ? ?CLINICAL DECISION MAKING: Stable/uncomplicated ? ?EVALUATION COMPLEXITY: Low ? ? ?GOALS: ?Goals reviewed with patient? Yes ? ?SHORT TERM GOALS: Target date: 06/05/2021 ? ?Patient will be I with initial HEP in order to progress with therapy. ?Baseline: HEP provided at evaluation ?Goal status: INITIAL ? ?2.  Patient will report pain level </= 5/10 in order to reduce functional limitations ?Baseline: 7-8/10 with medication, 10/10 without medication ?Goal status: INITIAL ? ?3.  Patient will demonstrate improve cervical rotation >/= 45 deg bilateral to indicate reduced muscle tension and improve ability to perform daily activities ?Baseline: right 35 deg, left 40 deg ?Goal status: INITIAL ? ?LONG TERM GOALS: Target date: 06/29/2021 ? ?Patient will be I with final HEP to maintain progress from PT. ?Baseline: HEP provided at evaluation ?Goal status: INITIAL ? ?2.  Patient will demonstrate cervical rotation >/= 55 deg bilaterally to improve safety with community ambulation ?Baseline: right 35 deg, left 40 deg ?Goal status: INITIAL ? ?3.  Patient will demonstrate shoulder flexion >/= 120 deg to improve ability to perform self care and dressing ?Baseline: right 110 deg, left 95 deg ?Goal status: INITIAL ? ?4.  Patient will exhibit DNF endurance >/= 20 deg to improve postural control and reduce neck pain ?Baseline: DNF  endurance 5 sec ?Goal status: INITIAL ? ? ?PLAN: ?PT FREQUENCY: 1x/week ? ?PT DURATION: 6 weeks ? ?PLANNED INTERVENTIONS: Therapeutic exercises, Therapeutic activity, Neuromuscular re-education, Balance training, Gait training, Patient/Family education, Joint manipulation, Joint mobilization, Aquatic Therapy, Dry Needling, Spinal manipulation, Spinal mobilization, Cryotherapy, Moist heat, Taping, and Manual therapy ? ?PLAN FOR NEXT SESSION: Review HEP and progress PRN, manual/dry needling for bilateral upper  traps and cervical paraspinals, suboccipital release and DNF endurance training, postural strengthening ? ? ?Hilda Blades, PT, DPT, LAT, ATC ?05/19/21  11:17 AM ?Phone: [phone] ?Fax: [phone] ? ? ? ?Check all possible

## 2021-05-18 NOTE — Patient Instructions (Signed)
Access Code: [email or web address] ?URL: [email or web address] ?Date: 05/18/2021 ?Prepared by: Hilda Blades ? ?Exercises ?- Seated Passive Cervical Retraction  - 2-3 x daily - 7 x weekly - 2 sets - 10 reps - 5 seconds hold ?- Gentle Upper Trap Stretch  - 2-3 x daily - 7 x weekly - 3 reps - 15 seconds hold ?- Gentle Levator Scapulae Stretch  - 2-3 x daily - 7 x weekly - 3 reps - 15 seconds hold ?- Standing Row with Anchored Resistance  - 2-3 x daily - 7 x weekly - 10 reps ?- Shoulder External Rotation and Scapular Retraction with Resistance  - 2-3 x daily - 7 x weekly - 10 reps ?- Standing Upper Trapezius Mobilization with Small Ball  - 2-3 x daily - 7 x weekly ?

## 2021-05-19 DIAGNOSIS — G43109 Migraine with aura, not intractable, without status migrainosus: Secondary | ICD-10-CM | POA: Diagnosis not present

## 2021-05-20 DIAGNOSIS — G43109 Migraine with aura, not intractable, without status migrainosus: Secondary | ICD-10-CM | POA: Diagnosis not present

## 2021-05-21 DIAGNOSIS — G43109 Migraine with aura, not intractable, without status migrainosus: Secondary | ICD-10-CM | POA: Diagnosis not present

## 2021-05-22 ENCOUNTER — Ambulatory Visit: Payer: Medicaid Other

## 2021-05-22 DIAGNOSIS — G43109 Migraine with aura, not intractable, without status migrainosus: Secondary | ICD-10-CM | POA: Diagnosis not present

## 2021-05-23 DIAGNOSIS — G43109 Migraine with aura, not intractable, without status migrainosus: Secondary | ICD-10-CM | POA: Diagnosis not present

## 2021-05-24 DIAGNOSIS — G43109 Migraine with aura, not intractable, without status migrainosus: Secondary | ICD-10-CM | POA: Diagnosis not present

## 2021-05-25 ENCOUNTER — Ambulatory Visit: Payer: Medicaid Other | Admitting: Physical Therapy

## 2021-05-25 DIAGNOSIS — G43109 Migraine with aura, not intractable, without status migrainosus: Secondary | ICD-10-CM | POA: Diagnosis not present

## 2021-05-26 DIAGNOSIS — G43109 Migraine with aura, not intractable, without status migrainosus: Secondary | ICD-10-CM | POA: Diagnosis not present

## 2021-05-27 DIAGNOSIS — G43109 Migraine with aura, not intractable, without status migrainosus: Secondary | ICD-10-CM | POA: Diagnosis not present

## 2021-05-28 DIAGNOSIS — G43109 Migraine with aura, not intractable, without status migrainosus: Secondary | ICD-10-CM | POA: Diagnosis not present

## 2021-05-29 ENCOUNTER — Other Ambulatory Visit: Payer: Self-pay | Admitting: Neurosurgery

## 2021-05-29 DIAGNOSIS — G43109 Migraine with aura, not intractable, without status migrainosus: Secondary | ICD-10-CM | POA: Diagnosis not present

## 2021-05-30 DIAGNOSIS — G43109 Migraine with aura, not intractable, without status migrainosus: Secondary | ICD-10-CM | POA: Diagnosis not present

## 2021-05-31 DIAGNOSIS — G43109 Migraine with aura, not intractable, without status migrainosus: Secondary | ICD-10-CM | POA: Diagnosis not present

## 2021-06-01 ENCOUNTER — Ambulatory Visit: Payer: Medicaid Other | Admitting: Physical Therapy

## 2021-06-01 ENCOUNTER — Encounter: Payer: Medicaid Other | Admitting: Physical Therapy

## 2021-06-01 DIAGNOSIS — G43109 Migraine with aura, not intractable, without status migrainosus: Secondary | ICD-10-CM | POA: Diagnosis not present

## 2021-06-02 DIAGNOSIS — G43109 Migraine with aura, not intractable, without status migrainosus: Secondary | ICD-10-CM | POA: Diagnosis not present

## 2021-06-03 DIAGNOSIS — G43109 Migraine with aura, not intractable, without status migrainosus: Secondary | ICD-10-CM | POA: Diagnosis not present

## 2021-06-04 ENCOUNTER — Encounter: Payer: Self-pay | Admitting: Dietician

## 2021-06-04 ENCOUNTER — Encounter: Payer: Medicaid Other | Attending: Internal Medicine | Admitting: Dietician

## 2021-06-04 DIAGNOSIS — E1142 Type 2 diabetes mellitus with diabetic polyneuropathy: Secondary | ICD-10-CM | POA: Insufficient documentation

## 2021-06-04 DIAGNOSIS — G43109 Migraine with aura, not intractable, without status migrainosus: Secondary | ICD-10-CM | POA: Diagnosis not present

## 2021-06-04 NOTE — Patient Instructions (Addendum)
Continue to take your medications as prescribed. ?  ?Treat low blood sugar (less than 70) with 1/2 cup juice.  ?  ?Increase exercise to 20 minutes most days.  Walk quickly if running is too difficult.  (10 minutes after each meal would be fine) ?  ?Continue to eat plenty of vegetables. ?Continue to choose whole grains.  Watch portion size. ?Choose lentils or beans with each lunch and dinner. ?  ?Continue to drink plenty of water and avoid sweetened beverages. ?Limit added salt. ?Use only small amounts of oil in cooking. ?  ?Eat until you are satisfied not overfull. ?  ?

## 2021-06-04 NOTE — Progress Notes (Signed)
?Diabetes Self-Management Education ? ?Visit Type: Follow-up ? ?Appt. Start Time: 1040 Appt. End Time: 0102 ? ?06/04/2021 ? ?[name], identified by name and date of birth, is a 61 y.o. female with a diagnosis of Diabetes:  .  ? ?ASSESSMENT ?Patient is here today with her daughte-in-law and a [place] interpretor [place]. ? ?States that her blood glucose meter has not worked for a couple of days and has not been checking. ?Provided sample blood glucose meter:  Accu [employer] Guide Me Lot [id number], Expiration 05/20/2022.  She was able to demonstrate use and blood glucose was 228 at 11:00 am.  She ate breakfast at 9:30 (roti and unsweetened tea) but forgot her medication.  Usual fasting blood glucose is 120-130.   ? ?She has increased her exercise and walks outside or on a treadmill for 10-15 minutes daily in addition to exercises from PT.  She is scheduled for neck surgery 07/15/2021. ? ?Weight 151 lb (68.5 kg). ?Body mass index is 25.92 kg/m?. ? ? Diabetes Self-Management Education - 06/04/21 1400   ? ?  ? Visit Information  ? Visit Type Follow-up   ?  ? Initial Visit  ? Are you currently following a meal plan? Yes   ? Are you taking your medications as prescribed? Yes   ?  ? Psychosocial Assessment  ? Self-care barriers Low literacy;English as a second language   ? Self-management support Doctor's office   ? Other persons present Patient;Family Member;Interpreter   ?  ? Pre-Education Assessment  ? Patient understands the diabetes disease and treatment process. Demonstrates understanding / competency   ? Patient understands incorporating nutritional management into lifestyle. Needs Review   ? Patient undertands incorporating physical activity into lifestyle. Demonstrates understanding / competency   ? Patient understands using medications safely. Demonstrates understanding / competency   ? Patient understands monitoring blood glucose, interpreting and using results Demonstrates understanding / competency   ? Patient  understands prevention, detection, and treatment of acute complications. Demonstrates understanding / competency   ? Patient understands prevention, detection, and treatment of chronic complications. Demonstrates understanding / competency   ? Patient understands how to develop strategies to address psychosocial issues. Demonstrates understanding / competency   ? Patient understands how to develop strategies to promote health/change behavior. Needs Review   ?  ? Complications  ? Fasting Blood glucose range (mg/dL) 70-129   ? Postprandial Blood glucose range (mg/dL) 130-179;180-200   ? Number of hypoglycemic episodes per month 0   ?  ? Dietary Intake  ? Breakfast roti   ? Snack (morning) none   ? Lunch brown rice, vegetables, lentils/beans   ? Dinner bread and vegetables   ? Snack (evening) none   ? Beverage(s) water, hot tea, occasional homemade juice   ?  ? Activity / Exercise  ? Activity / Exercise Type Light (walking / raking leaves)   ? How many days per week do you exercise? 7   ? How many minutes per day do you exercise? 15   ? Total minutes per week of exercise 105   ?  ? Patient Education  ? Previous Diabetes Education Yes (please comment)   ? Healthy Eating Meal options for control of blood glucose level and chronic complications.   ? Being Active Helped patient identify appropriate exercises in relation to his/her diabetes, diabetes complications and other health issue.   encouraged to continue to increase as able  ? Medications Reviewed patients medication for diabetes, action, purpose, timing of  dose and side effects.   ? Monitoring Taught/evaluated SMBG meter.   ?  ? Individualized Goals (developed by patient)  ? Nutrition General guidelines for healthy choices and portions discussed   ? Physical Activity Exercise 3-5 times per week;30 minutes per day   ? Monitoring  Test my blood glucose as discussed   ?  ? Patient Self-Evaluation of Goals - Patient rates self as meeting previously set goals (% of  time)  ? Nutrition >75% (most of the time)   ? Physical Activity >75% (most of the time)   ? Medications >75% (most of the time)   ? Monitoring >75% (most of the time)   ? Problem Solving and behavior change strategies  >75% (most of the time)   ? Reducing Risk (treating acute and chronic complications) >12% (most of the time)   ? Health Coping >75% (most of the time)   ?  ? Post-Education Assessment  ? Patient understands the diabetes disease and treatment process. Demonstrates understanding / competency   ? Patient understands incorporating nutritional management into lifestyle. Demonstrates understanding / competency   ? Patient undertands incorporating physical activity into lifestyle. Demonstrates understanding / competency   ? Patient understands using medications safely. Demonstrates understanding / competency   ? Patient understands monitoring blood glucose, interpreting and using results Demonstrates understanding / competency   ? Patient understands prevention, detection, and treatment of acute complications. Demonstrates understanding / competency   ? Patient understands prevention, detection, and treatment of chronic complications. Demonstrates understanding / competency   ? Patient understands how to develop strategies to address psychosocial issues. Demonstrates understanding / competency   ? Patient understands how to develop strategies to promote health/change behavior. Needs Review   ?  ? Outcomes  ? Expected Outcomes Demonstrated interest in learning. Expect positive outcomes   ? Future DMSE 2 months   ? Program Status Not Completed   ?  ? Subsequent Visit  ? Since your last visit have you experienced any weight changes? No change   ? ?  ?  ? ?  ? ? ?Individualized Plan for Diabetes Self-Management Training:  ? ?Learning Objective:  Patient will have a greater understanding of diabetes self-management. ?Patient education plan is to attend individual and/or group sessions per assessed needs and  concerns. ?  ?Plan:  ? ?Patient Instructions  ?Continue to take your medications as prescribed. ?  ?Treat low blood sugar (less than 70) with 1/2 cup juice.  ?  ?Increase exercise to 20 minutes most days.  Walk quickly if running is too difficult.  (10 minutes after each meal would be fine) ?  ?Continue to eat plenty of vegetables. ?Continue to choose whole grains.  Watch portion size. ?Choose lentils or beans with each lunch and dinner. ?  ?Continue to drink plenty of water and avoid sweetened beverages. ?Limit added salt. ?Use only small amounts of oil in cooking. ?  ?Eat until you are satisfied not overfull. ?  ?Expected Outcomes:  Demonstrated interest in learning. Expect positive outcomes ? ?Education material provided:  ? ?If problems or questions, patient to contact team via:  Phone ? ?Future DSME appointment: 2 months ? ? ?

## 2021-06-07 DIAGNOSIS — G43109 Migraine with aura, not intractable, without status migrainosus: Secondary | ICD-10-CM | POA: Diagnosis not present

## 2021-06-08 DIAGNOSIS — G43109 Migraine with aura, not intractable, without status migrainosus: Secondary | ICD-10-CM | POA: Diagnosis not present

## 2021-06-09 DIAGNOSIS — G43109 Migraine with aura, not intractable, without status migrainosus: Secondary | ICD-10-CM | POA: Diagnosis not present

## 2021-06-10 DIAGNOSIS — G43109 Migraine with aura, not intractable, without status migrainosus: Secondary | ICD-10-CM | POA: Diagnosis not present

## 2021-06-11 ENCOUNTER — Ambulatory Visit: Payer: Medicaid Other | Attending: Internal Medicine

## 2021-06-11 DIAGNOSIS — G43109 Migraine with aura, not intractable, without status migrainosus: Secondary | ICD-10-CM | POA: Diagnosis not present

## 2021-06-11 DIAGNOSIS — M6281 Muscle weakness (generalized): Secondary | ICD-10-CM | POA: Diagnosis present

## 2021-06-11 DIAGNOSIS — M542 Cervicalgia: Secondary | ICD-10-CM | POA: Diagnosis present

## 2021-06-11 NOTE — Therapy (Addendum)
OUTPATIENT PHYSICAL THERAPY RE-EVALUATION/DISCHARGE  PHYSICAL THERAPY DISCHARGE SUMMARY  Visits from Start of Care: 4  Current functional level related to goals / functional outcomes: Unable to assess   Remaining deficits: Unable to assess   Education / Equipment: N/A   Patient agrees to discharge. Patient goals were  unable to assess . Patient is being discharged due to not returning since the last visit.   Patient Name: April Mcmahon MRN: [id number] DOB:05-Feb-1960, 61 y.o., female Today's Date: 05/18/2021   PT End of Session - 06/11/21 [place]     Visit Number 4    Number of Visits 7    Date for PT Re-Evaluation 06/05/21    Authorization Type [employer]    Authorization Time Period awaiting authorization    PT Start Time 1745    PT Stop Time 6720    PT Time Calculation (min) 39 min    Activity Tolerance Patient limited by pain    Behavior During Therapy [facility] for tasks assessed/performed              Past Medical History:  Diagnosis Date   Bilateral chronic knee pain 06/2014   Chronic low back pain 06/2014   Chronic migraine 02/1985   Depression    hx of   Diabetes mellitus without complication ([place]) 94/7096   Fall    Hyperlipidemia    Hypertension 09/2013   Shoulder pain    Past Surgical History:  Procedure Laterality Date   no previous surgeries     Patient Active Problem List   Diagnosis Date Noted   Dyslipidemia 10/17/2020   Uncontrolled type 2 diabetes mellitus with peripheral neuropathy 10/17/2020   Hyperlipidemia associated with type 2 diabetes mellitus ([place]) 05/05/2020   Influenza vaccine needed 01/03/2020   Former smoker 01/03/2020   Acquired hypothyroidism 01/03/2020   Microalbuminuria 11/15/2018   Muscle cramps 02/21/2018   Hepatic steatosis 12/23/2017   MCI (mild cognitive impairment) 02/14/2017   Diabetic peripheral neuropathy ([place]) 02/14/2017   Memory loss 12/27/2016   Dysfunctional uterine bleeding 08/30/2016   Analgesic  rebound headache 06/24/2016   Esophageal dysphagia 06/21/2016   Tobacco dependence 01/20/2016   Plantar fasciitis, bilateral 12/24/2015   DJD (degenerative joint disease) of knee 06/20/2015   Type 2 diabetes mellitus with peripheral neuropathy ([place]) 06/19/2015   Essential hypertension 06/19/2015   Vitamin D deficiency 09/24/2013   Migraine 05/15/2013   Low back pain 08/18/2012    PCP: Ladell Pier, MD  REFERRING PROVIDER: Ladell Pier, MD  REFERRING DIAG: Neck pain, chronic  THERAPY DIAG:  Cervicalgia  Muscle weakness (generalized)  ONSET DATE: chronic pain   SUBJECTIVE:      SUBJECTIVE STATEMENT: Pt presents to PT with daughter-in-law for translation. She notes that she has greatly reduced her pain since starting therapy and believes her exercises are helping. She is having an ACDF on 07/15/2021 but would like to continue PT until then. She is ready to begin PT at this time.   PERTINENT HISTORY:  Chronic neck and low back pain, Migraines, Depression, DM, Falls, HLD, HTN  PAIN:  Are you having pain? 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-7 where there is moderate spinal stenosis and moderate to severe left neural foraminal stenosis  PATIENT SURVEYS:  Not assessed due to  language barrier  COGNITION: Overall cognitive status: Within functional limits for tasks assessed  SENSATION: WFL  POSTURE:  Rounded shoulders and forward head   PALPATION: Exquisite tenderness bilateral upper traps and cervical paraspinals with palpable trigger points noted   CERVICAL ROM:   Active ROM A/PROM (deg) 05/18/2021  Flexion 45  Extension 35  Right lateral flexion 25  Left lateral flexion 25  Right  rotation 35  Left rotation 40  *pain noted in all directions of cervical movement  UE ROM:  AROM Right 05/18/2021 Left 05/18/2021  Shoulder flexion 110 95    UE MMT:  MMT Right 05/18/2021 Left 05/18/2021  Shoulder flexion 4 4-  Shoulder extension 4 4  Shoulder abduction 4 4-  Shoulder internal rotation 4 4  Shoulder external rotation 4 4-  Periscapular musculature 4- 4-   CERVICAL SPECIAL TESTS:  Neck flexor muscle endurance test: 5 seconds Spurling's test: Negative   TODAY'S TREATMENT:  Therapeutic Exercise: Seated horizontal abduction 2x10 GTB Seated row 2x10 GTB Seated chin tuck with finger assist on chin 2x10 5 sec Seated bilateral ER 3x10 RTB Seated upper trap and levator scap stretch 2 x 15 sec each   Manual Therapy Suboccipital release  Positional release upper trap Trigger point release bilateral upper trap  PATIENT EDUCATION:  Education details: Exam findings, POC, HEP, dry needling for future appointments Person educated: Patient Education method: Explanation, Demonstration, Tactile cues, Verbal cues, and Handouts Education comprehension: verbalized understanding, returned demonstration, verbal cues required, tactile cues required, and needs further education  HOME EXERCISE PROGRAM: Access Code: [email or web address]   ASSESSMENT: CLINICAL IMPRESSION: Pt was able to complete all prescribed exercises with no adverse effect or increase in pain. Therapy today focused on improving DNF endurance and periscapular strength in order to decrease pain and improve function. She responded well to manual therapy interventions noting 0/10 pain post session. She continues to benefit from skilled PT and will continue to be seen and progressed as able per POC.    OBJECTIVE IMPAIRMENTS decreased activity tolerance, decreased ROM, decreased strength, increased muscle spasms, postural dysfunction, and pain.   ACTIVITY LIMITATIONS cleaning, community activity, meal prep, laundry, and  shopping.   PERSONAL FACTORS Fitness, Past/current experiences, and Time since onset of injury/illness/exacerbation are also affecting patient's functional outcome.    GOALS: Goals reviewed with patient? Yes  SHORT TERM GOALS: Target date: 06/05/2021  Patient will be I with initial HEP in order to progress with therapy. Baseline: HEP provided at evaluation Goal status: INITIAL  2.  Patient will report pain level </= 5/10 in order to reduce functional limitations Baseline: 7-8/10 with medication, 10/10 without medication Goal status: INITIAL  3.  Patient will demonstrate improve cervical rotation >/= 45 deg bilateral to indicate reduced muscle tension and improve ability to perform daily activities Baseline: right 35 deg, left 40 deg Goal status: INITIAL  LONG TERM GOALS: Target date: 06/29/2021  Patient will be I with final HEP to maintain progress from PT. Baseline: HEP provided at evaluation Goal status: INITIAL  2.  Patient will demonstrate cervical rotation >/= 55 deg bilaterally to improve safety with community ambulation Baseline: right 35 deg, left 40 deg Goal status: INITIAL  3.  Patient will demonstrate shoulder flexion >/= 120 deg to improve ability to perform self care and dressing Baseline: right 110 deg, left 95 deg Goal status: INITIAL  4.  Patient will exhibit DNF endurance >/= 20 deg to improve postural control and reduce neck pain Baseline: DNF endurance 5 sec  Goal status: INITIAL   PLAN: PT FREQUENCY: 1x/week  PT DURATION: 6 weeks  PLANNED INTERVENTIONS: Therapeutic exercises, Therapeutic activity, Neuromuscular re-education, Balance training, Gait training, Patient/Family education, Joint manipulation, Joint mobilization, Aquatic Therapy, Dry Needling, Spinal manipulation, Spinal mobilization, Cryotherapy, Moist heat, Taping, and Manual therapy  PLAN FOR NEXT SESSION: Review HEP and progress PRN, manual/dry needling for bilateral upper traps and  cervical paraspinals, suboccipital release and DNF endurance training, postural strengthening   Hilda Blades, PT, DPT, LAT, ATC 06/11/21  6:29 PM Phone: [phone] Fax: [phone]    Check all possible CPT codes: 16435 - Re-evaluation, 97110- Therapeutic Exercise, [phone]- Neuro Re-education, [phone] - Gait Training, [phone] - Manual Therapy, 97530 - Therapeutic Activities, [phone] - Self Care, and [phone] - Aquatic therapy     If treatment provided at initial evaluation, no treatment charged due to lack of authorization.

## 2021-06-12 DIAGNOSIS — G43109 Migraine with aura, not intractable, without status migrainosus: Secondary | ICD-10-CM | POA: Diagnosis not present

## 2021-06-13 DIAGNOSIS — G43109 Migraine with aura, not intractable, without status migrainosus: Secondary | ICD-10-CM | POA: Diagnosis not present

## 2021-06-14 DIAGNOSIS — G43109 Migraine with aura, not intractable, without status migrainosus: Secondary | ICD-10-CM | POA: Diagnosis not present

## 2021-06-15 ENCOUNTER — Ambulatory Visit: Payer: Medicaid Other | Admitting: Physical Therapy

## 2021-06-15 ENCOUNTER — Encounter: Payer: Self-pay | Admitting: Neurology

## 2021-06-15 ENCOUNTER — Ambulatory Visit: Payer: Medicaid Other | Admitting: Neurology

## 2021-06-15 VITALS — BP 134/84 | HR 73 | Wt 150.5 lb

## 2021-06-15 DIAGNOSIS — E1142 Type 2 diabetes mellitus with diabetic polyneuropathy: Secondary | ICD-10-CM

## 2021-06-15 DIAGNOSIS — R42 Dizziness and giddiness: Secondary | ICD-10-CM

## 2021-06-15 DIAGNOSIS — G43109 Migraine with aura, not intractable, without status migrainosus: Secondary | ICD-10-CM | POA: Diagnosis not present

## 2021-06-15 MED ORDER — DULOXETINE HCL 30 MG PO CPEP: 30.0000 mg | ORAL_CAPSULE | Freq: Every day | ORAL | 0 refills | Status: DC

## 2021-06-15 MED ORDER — DULOXETINE HCL 60 MG PO CPEP: 60.0000 mg | ORAL_CAPSULE | Freq: Every day | ORAL | 3 refills | Status: DC

## 2021-06-15 NOTE — Patient Instructions (Signed)
Increase water intake, at least 64 ounces each day. ? ? ?Lab evaluation today. ? ?Cymbalta 30 mg daily after breakfast now ? ?Then '60mg'$  daily. ? ? ?

## 2021-06-15 NOTE — Progress Notes (Signed)
? ?Chief Complaint  ?Patient presents with  ? New Patient (Initial Visit)  ?  Rm 15. Accompanied by daughter-in-law. ?NX Dr. Orson Aloe 2019/internal referral for dizziness and frequent falls.  ? ? ? ? ?ASSESSMENT AND PLAN ? ?April Mcmahon is a 61 y.o. 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 Suboptimal control of diabetes in the past, improved now, ? Likely orthostatic hypotension or tachycardia, probable underlying mood disorder likely playing a role as well ? Laboratory evaluation for possible other causes of peripheral neuropathy ? Encouraged to increase water intake ? Cymbalta 30 mg daily then titrating to 60 mg daily ? If she remains symptomatic, may consider ultrasound of carotid artery ? Follow-up with nurse practitioner in 6 months ?  ? ? ?DIAGNOSTIC DATA (LABS, IMAGING, TESTING) ?- I reviewed patient records, labs, notes, testing and imaging myself where available. ? ? ?MEDICAL HISTORY: ? ?April Mcmahon is a 61 year old female, accompanied by her daughter-in-law, interpreter at today's visit, she is native of [place], seen in request by primary care doctor Karle Plumber for evaluation of dizziness ? ?I reviewed and summarized the referring note. PMHX. ?HTN ?HLD ?DM- insulin dependent,  ? ?I saw her previously in 2019 for evaluation of memory loss. ? ?She is negative of [place], moved to [place] in 2011, she was illiterate, has 6 children, lives with her son and his family, she was noted to have gradual onset memory loss since 2016, gradually getting worse, but when she cooks, she forgot to turn off the stove leading to fire alarm 3 times over the past 3 years, also missed taking her medications, but tends to repeat herself, get frustrated and agitated easily, ?  ?She is still independent in her daily activity otherwise, denied family history of dementia ?  ?Laboratory evaluation 2018 A1c  6.3, normal CBC, TSH, B12, C-reactive protein, ESR, RPR, HIV, ? ?MRI of the brain in December 2018 which showed mild generalized atrophy ?  ?She lives with her son's family, complain few months history of frequent dizziness, mainly happening when she is standing up, denies vertigo, denies hearing loss, does complains of gradual worsening bilateral feet paresthesia for few years, starting at the plantar surface and toes, is at distal left shin level, ? ?Laboratory in 2023: Normal TSH, A1c March 6 0.9, was 10 in September 22, ? ?PHYSICAL EXAM: ?  ?Vitals:  ? 06/15/21 1529  ?BP: 134/84  ?Pulse: 73  ?Weight: 150 lb 8 oz (68.3 kg)  ? ?Blood pressure lying down 146/80, heart rate of 69; sitting up 144/63 heart rate of 67, standing up 141/65 heart rate of 72, ? ? ?Body mass index is 25.83 kg/m?. ? ?PHYSICAL EXAMNIATION: ? ?Gen: NAD, conversant, well nourised, well groomed                     ?Cardiovascular: Regular rate rhythm, no peripheral edema, warm, nontender. ?Eyes: Conjunctivae clear without exudates or hemorrhage ?Neck: Supple, no carotid bruits. ?Pulmonary: Clear to auscultation bilaterally  ? ?NEUROLOGICAL EXAM: ? ?MENTAL STATUS: ?Speech/cognition: Depressed looking middle-aged female, language barrier, following visual commands ? ?CRANIAL NERVES: ?CN II: Visual fields are full to confrontation. Pupils are round equal and briskly reactive to light. ?CN III, IV, VI: extraocular movement are normal. No ptosis. ?CN V: Facial sensation is intact to light touch ?CN VII: Face is symmetric with normal eye closure  ?CN VIII: Hearing is normal to  causal conversation. ?CN IX, X: Phonation is normal. ?CN XI: Head turning and shoulder shrug are intact ? ?MOTOR: ?There is no pronator drift of out-stretched arms. Muscle bulk and tone are normal. Muscle strength is normal. ? ?REFLEXES: ?Reflexes are 1  and symmetric at the biceps, triceps, knees, and absent at ankles. Plantar responses are flexor. ? ?SENSORY: Mildly  length-dependent decreased vibratory sensation, pinprick light touch to ankle level ? ?COORDINATION: ?There is no trunk or limb dysmetria noted. ? ?GAIT/STANCE: ?Posture is normal. Gait is steady with normal steps,  ? ?REVIEW OF SYSTEMS:  ?Full 14 system review of systems performed and notable only for as above ?All other review of systems were negative. ? ? ?ALLERGIES: ?Allergies  ?Allergen Reactions  ? Elavil [Amitriptyline Hcl] Diarrhea and Nausea And Vomiting  ? ? ?HOME MEDICATIONS: ?Current Outpatient Medications  ?Medication Sig Dispense Refill  ? Accu-Chek Softclix Lancets lancets Check blood sugar three times daily. Dx E11.42 100 each 12  ? amLODipine (NORVASC) 10 MG tablet Take 1 tablet (10 mg total) by mouth daily. 30 tablet 11  ? atorvastatin (LIPITOR) 20 MG tablet Take 1 tablet (20 mg total) by mouth daily. 90 tablet 3  ? Blood Glucose Monitoring Suppl (ACCU-CHEK GUIDE ME) w/Device KIT Check blood sugar three times daily. Dx E11.42 1 kit 0  ? diclofenac Sodium (VOLTAREN) 1 % GEL Apply 2 g topically 4 (four) times daily. 100 g 3  ? empagliflozin (JARDIANCE) 25 MG TABS tablet Take 1 tablet (25 mg total) by mouth daily before breakfast. 90 tablet 3  ? fluticasone (FLONASE) 50 MCG/ACT nasal spray Place 2 sprays into both nostrils daily. 16 g 0  ? glucose blood (ACCU-CHEK GUIDE) test strip Use as instructed 100 each 12  ? insulin glargine (LANTUS SOLOSTAR) 100 UNIT/ML Solostar Pen Inject 14 Units into the skin daily. 15 mL 6  ? Insulin Pen Needle 32G X 4 MM MISC 1 Device by Does not apply route daily in the afternoon. 100 each 3  ? levothyroxine (SYNTHROID) 50 MCG tablet TAKE 1 TABLET BY MOUTH EVERY DAY 90 tablet 2  ? loratadine (CLARITIN) 10 MG tablet Take 1 tablet (10 mg total) by mouth daily. 30 tablet 3  ? meloxicam (MOBIC) 15 MG tablet Take 1 tablet (15 mg total) by mouth daily. For arthritis pain 30 tablet 6  ? metFORMIN (GLUCOPHAGE) 1000 MG tablet Take 1 tablet (1,000 mg total) by mouth 2 (two) times  daily. 180 tablet 3  ? gabapentin (NEURONTIN) 600 MG tablet Take 0.5 tablets (300 mg total) by mouth 3 (three) times daily. 45 tablet 1  ? ?No current facility-administered medications for this visit.  ? ? ?PAST MEDICAL HISTORY: ?Past Medical History:  ?Diagnosis Date  ? Bilateral chronic knee pain 06/2014  ? Chronic low back pain 06/2014  ? Chronic migraine 02/1985  ? Depression   ? hx of  ? Diabetes mellitus without complication ([place]) 40/3474  ? Fall   ? Hyperlipidemia   ? Hypertension 09/2013  ? Shoulder pain   ? ? ?PAST SURGICAL HISTORY: ?Past Surgical History:  ?Procedure Laterality Date  ? no previous surgeries    ? ? ?FAMILY HISTORY: ?Family History  ?Problem Relation Age of Onset  ? Migraines Mother   ? Esophageal cancer Other   ? Breast cancer Neg Hx   ? Colon cancer Neg Hx   ? Rectal cancer Neg Hx   ? Stomach cancer Neg Hx   ? ? ?SOCIAL HISTORY: ?Social History  ? ?  Socioeconomic History  ? Marital status: Married  ?  Spouse name: Not on file  ? Number of children: Not on file  ? Years of education: Not on file  ? Highest education level: Not on file  ?Occupational History  ? Not on file  ?Tobacco Use  ? Smoking status: Former  ? Smokeless tobacco: Never  ?Vaping Use  ? Vaping Use: Never used  ?Substance and Sexual Activity  ? Alcohol use: No  ? Drug use: No  ? Sexual activity: Not on file  ?Other Topics Concern  ? Not on file  ?Social History Narrative  ? Not on file  ? ?Social Determinants of Health  ? ?Financial Resource Strain: Not on file  ?Food Insecurity: Not on file  ?Transportation Needs: Not on file  ?Physical Activity: Not on file  ?Stress: Not on file  ?Social Connections: Not on file  ?Intimate Partner Violence: Not on file  ? ? ? ? ?Marcial Pacas, M.D. Ph.D. ? ?[facility] ?[address] ?[place], [place] [address] ?Ph: [phone]) [phone] ?Fax: [phone] ? ?CC:  Ladell Pier, MD ?[address] ?[address] ?[place],  [place] [address]  Ladell Pier, MD   ?

## 2021-06-15 NOTE — Therapy (Incomplete)
?OUTPATIENT PHYSICAL THERAPY RE-EVALUATION ? ? ?Patient Name: April Mcmahon ?MRN: [id number] ?DOB:29-Mar-1960, 61 y.o., female ?Today's Date: 05/18/2021 ? ? ? ? ? ?Past Medical History:  ?Diagnosis Date  ? Bilateral chronic knee pain 06/2014  ? Chronic low back pain 06/2014  ? Chronic migraine 02/1985  ? Depression   ? hx of  ? Diabetes mellitus without complication ([place]) 81/0175  ? Fall   ? Hyperlipidemia   ? Hypertension 09/2013  ? Shoulder pain   ? ?Past Surgical History:  ?Procedure Laterality Date  ? no previous surgeries    ? ?Patient Active Problem List  ? Diagnosis Date Noted  ? Dyslipidemia 10/17/2020  ? Uncontrolled type 2 diabetes mellitus with peripheral neuropathy 10/17/2020  ? Hyperlipidemia associated with type 2 diabetes mellitus ([place]) 05/05/2020  ? Influenza vaccine needed 01/03/2020  ? Former smoker 01/03/2020  ? Acquired hypothyroidism 01/03/2020  ? Microalbuminuria 11/15/2018  ? Muscle cramps 02/21/2018  ? Hepatic steatosis 12/23/2017  ? MCI (mild cognitive impairment) 02/14/2017  ? Diabetic peripheral neuropathy ([place]) 02/14/2017  ? Memory loss 12/27/2016  ? Dysfunctional uterine bleeding 08/30/2016  ? Analgesic rebound headache 06/24/2016  ? Esophageal dysphagia 06/21/2016  ? Tobacco dependence 01/20/2016  ? Plantar fasciitis, bilateral 12/24/2015  ? DJD (degenerative joint disease) of knee 06/20/2015  ? Type 2 diabetes mellitus with peripheral neuropathy ([place]) 06/19/2015  ? Essential hypertension 06/19/2015  ? Vitamin D deficiency 09/24/2013  ? Migraine 05/15/2013  ? Low back pain 08/18/2012  ? ? ?PCP: Ladell Pier, MD ? ?REFERRING PROVIDER: Ladell Pier, MD ? ?REFERRING DIAG: Neck pain, chronic ? ?THERAPY DIAG:  ?No diagnosis found. ? ?ONSET DATE: chronic pain ? ? ?SUBJECTIVE:      ?SUBJECTIVE STATEMENT: ?Pt presents to PT with daughter-in-law for translation. She notes that she has greatly reduced her pain since starting therapy and believes her exercises are helping. She is having  an ACDF on 07/15/2021 but would like to continue PT until then. She is ready to begin PT at this time.  ? ?PERTINENT HISTORY:  ?Chronic neck and low back pain, Migraines, Depression, DM, Falls, HLD, HTN ? ?PAIN:  ?Are you having pain? Yes:  ?NPRS scale: 7-8/10 (10/10 without medication) ?Pain location: Neck ?Pain description: Constant, pounding, aching ?Aggravating factors: "always hurts" ?Relieving factors: Medication ? ?PRECAUTIONS: Fall ? ? ?OBJECTIVE:  ?DIAGNOSTIC FINDINGS:  ?MRI Cervical Spine 04/30/2021: Spondylosis primarily at C5-6 and C6-7 where there is moderate spinal stenosis and moderate to severe left neural foraminal stenosis ? ?PATIENT SURVEYS:  ?Not assessed due to language barrier ? ?COGNITION: ?Overall cognitive status: Within functional limits for tasks assessed ? ?SENSATION: ?WFL ? ?POSTURE:  ?Rounded shoulders and forward head ? ? PALPATION: ?Exquisite tenderness bilateral upper traps and cervical paraspinals with palpable trigger points noted  ? ?CERVICAL ROM:  ? ?Active ROM A/PROM (deg) ?05/18/2021  ?Flexion 45  ?Extension 35  ?Right lateral flexion 25  ?Left lateral flexion 25  ?Right rotation 35  ?Left rotation 40  ?*pain noted in all directions of cervical movement ? ?UE ROM: ? ?AROM Right ?05/18/2021 Left ?05/18/2021  ?Shoulder flexion 110 95  ? ? ?UE MMT: ? ?MMT Right ?05/18/2021 Left ?05/18/2021  ?Shoulder flexion 4 4-  ?Shoulder extension 4 4  ?Shoulder abduction 4 4-  ?Shoulder internal rotation 4 4  ?Shoulder external rotation 4 4-  ?Periscapular musculature 4- 4-  ? ?CERVICAL SPECIAL TESTS:  ?Neck flexor muscle endurance test: 5 seconds ?Spurling's test: Negative ? ? ?TODAY'S TREATMENT:  ?[facility]  Adult PT Treatment:                                                DATE: 06/15/2021 ?Therapeutic Exercise: ?Seated horizontal abduction 2x10 GTB ?Seated row 2x10 GTB ?Seated chin tuck with finger assist on chin 2x10 5 sec ?Seated bilateral ER 3x10 RTB ?Seated upper trap and levator scap stretch 2 x 15  sec each ?  Manual Therapy: ?Suboccipital release  ?Positional release upper trap ?Trigger point release bilateral upper trap ? ? ?[facility] Adult PT Treatment:                                                DATE: 06/11/2021 ?Therapeutic Exercise: ?Seated horizontal abduction 2x10 GTB ?Seated row 2x10 GTB ?Seated chin tuck with finger assist on chin 2x10 5 sec ?Seated bilateral ER 3x10 RTB ?Seated upper trap and levator scap stretch 2 x 15 sec each ?  Manual Therapy: ?Suboccipital release  ?Positional release upper trap ?Trigger point release bilateral upper trap ? ?[facility] Adult PT Treatment:                                                DATE: 05/18/2021 ?Therapeutic Exercise: ?Seated chin tuck with finger assist on chin 5 x 5 sec ?Seated upper trap and levator scap stretch 2 x 15 sec each ?Seated row with red x 10 ?Seated double ER and scap retraction with yellow x 10 ?Instruction of SMFR using tennis ball for upper trap regions ? ?PATIENT EDUCATION:  ?Education details: HEP ?Person educated: Patient ?Education method: Explanation, Demonstration, Tactile cues, Verbal cues, and Handouts ?Education comprehension: verbalized understanding, returned demonstration, verbal cues required, tactile cues required, and needs further education ? ?HOME EXERCISE PROGRAM: ?Access Code: [email or web address] ? ? ?ASSESSMENT: ?CLINICAL IMPRESSION: ?Patient tolerated therapy well with no adverse effects. *** ? ?Pt was able to complete all prescribed exercises with no adverse effect or increase in pain. Therapy today focused on improving DNF endurance and periscapular strength in order to decrease pain and improve function. She responded well to manual therapy interventions noting 0/10 pain post session. She continues to benefit from skilled PT and will continue to be seen and progressed as able per POC.  ? ? ?OBJECTIVE IMPAIRMENTS decreased activity tolerance, decreased ROM, decreased strength, increased muscle spasms, postural dysfunction, and pain.   ? ?ACTIVITY LIMITATIONS cleaning, community activity, meal prep, laundry, and shopping.  ? ?PERSONAL FACTORS Fitness, Past/current experiences, and Time since onset of injury/illness/exacerbation are also affecting patient's functional outcome.  ? ? ?GOALS: ?Goals reviewed with patient? Yes ? ?SHORT TERM GOALS: Target date: 06/05/2021 ? ?Patient will be I with initial HEP in order to progress with therapy. ?Baseline: HEP provided at evaluation ?Goal status: INITIAL ? ?2.  Patient will report pain level </= 5/10 in order to reduce functional limitations ?Baseline: 7-8/10 with medication, 10/10 without medication ?Goal status: INITIAL ? ?3.  Patient will demonstrate improve cervical rotation >/= 45 deg bilateral to indicate reduced muscle tension and improve ability to perform daily activities ?Baseline: right 35 deg, left 40 deg ?Goal status: INITIAL ? ?  LONG TERM GOALS: Target date: 06/29/2021 ? ?Patient will be I with final HEP to maintain progress from PT. ?Baseline: HEP provided at evaluation ?Goal status: INITIAL ? ?2.  Patient will demonstrate cervical rotation >/= 55 deg bilaterally to improve safety with community ambulation ?Baseline: right 35 deg, left 40 deg ?Goal status: INITIAL ? ?3.  Patient will demonstrate shoulder flexion >/= 120 deg to improve ability to perform self care and dressing ?Baseline: right 110 deg, left 95 deg ?Goal status: INITIAL ? ?4.  Patient will exhibit DNF endurance >/= 20 deg to improve postural control and reduce neck pain ?Baseline: DNF endurance 5 sec ?Goal status: INITIAL ? ? ?PLAN: ?PT FREQUENCY: 1x/week ? ?PT DURATION: 6 weeks ? ?PLANNED INTERVENTIONS: Therapeutic exercises, Therapeutic activity, Neuromuscular re-education, Balance training, Gait training, Patient/Family education, Joint manipulation, Joint mobilization, Aquatic Therapy, Dry Needling, Spinal manipulation, Spinal mobilization, Cryotherapy, Moist heat, Taping, and Manual therapy ? ?PLAN FOR NEXT SESSION: Review  HEP and progress PRN, manual/dry needling for bilateral upper traps and cervical paraspinals, suboccipital release and DNF endurance training, postural strengthening ? ? ?Hilda Blades, PT, DPT, LAT,

## 2021-06-16 DIAGNOSIS — G43109 Migraine with aura, not intractable, without status migrainosus: Secondary | ICD-10-CM | POA: Diagnosis not present

## 2021-06-16 LAB — SEDIMENTATION RATE: Sed Rate: 14 mm/hr (ref 0–40)

## 2021-06-16 LAB — C-REACTIVE PROTEIN: CRP: 1 mg/L (ref 0–10)

## 2021-06-16 LAB — CBC WITH DIFFERENTIAL
Basophils Absolute: 0 10*3/uL (ref 0.0–0.2)
Basos: 1 %
EOS (ABSOLUTE): 0.2 10*3/uL (ref 0.0–0.4)
Eos: 3 %
Hematocrit: 37.1 % (ref 34.0–46.6)
Hemoglobin: 12.7 g/dL (ref 11.1–15.9)
Immature Grans (Abs): 0 10*3/uL (ref 0.0–0.1)
Immature Granulocytes: 0 %
Lymphocytes Absolute: 3.2 10*3/uL — ABNORMAL HIGH (ref 0.7–3.1)
Lymphs: 40 %
MCH: 26.9 pg (ref 26.6–33.0)
MCHC: 34.2 g/dL (ref 31.5–35.7)
MCV: 79 fL (ref 79–97)
Monocytes Absolute: 0.5 10*3/uL (ref 0.1–0.9)
Monocytes: 6 %
Neutrophils Absolute: 4.2 10*3/uL (ref 1.4–7.0)
Neutrophils: 50 %
RBC: 4.72 x10E6/uL (ref 3.77–5.28)
RDW: 12.7 % (ref 11.7–15.4)
WBC: 8.1 10*3/uL (ref 3.4–10.8)

## 2021-06-16 LAB — CK: Total CK: 82 U/L (ref 32–182)

## 2021-06-16 LAB — ANA W/REFLEX IF POSITIVE: Anti Nuclear Antibody (ANA): NEGATIVE

## 2021-06-16 LAB — HIV ANTIBODY (ROUTINE TESTING W REFLEX): HIV Screen 4th Generation wRfx: NONREACTIVE

## 2021-06-16 LAB — VITAMIN B12: Vitamin B-12: 300 pg/mL (ref 232–1245)

## 2021-06-16 LAB — RPR: RPR Ser Ql: NONREACTIVE

## 2021-06-17 DIAGNOSIS — G43109 Migraine with aura, not intractable, without status migrainosus: Secondary | ICD-10-CM | POA: Diagnosis not present

## 2021-06-18 DIAGNOSIS — G43109 Migraine with aura, not intractable, without status migrainosus: Secondary | ICD-10-CM | POA: Diagnosis not present

## 2021-06-19 DIAGNOSIS — G43109 Migraine with aura, not intractable, without status migrainosus: Secondary | ICD-10-CM | POA: Diagnosis not present

## 2021-06-20 DIAGNOSIS — G43109 Migraine with aura, not intractable, without status migrainosus: Secondary | ICD-10-CM | POA: Diagnosis not present

## 2021-06-21 DIAGNOSIS — G43109 Migraine with aura, not intractable, without status migrainosus: Secondary | ICD-10-CM | POA: Diagnosis not present

## 2021-06-22 DIAGNOSIS — G43109 Migraine with aura, not intractable, without status migrainosus: Secondary | ICD-10-CM | POA: Diagnosis not present

## 2021-06-23 DIAGNOSIS — G43109 Migraine with aura, not intractable, without status migrainosus: Secondary | ICD-10-CM | POA: Diagnosis not present

## 2021-06-24 DIAGNOSIS — G43109 Migraine with aura, not intractable, without status migrainosus: Secondary | ICD-10-CM | POA: Diagnosis not present

## 2021-06-25 DIAGNOSIS — G43109 Migraine with aura, not intractable, without status migrainosus: Secondary | ICD-10-CM | POA: Diagnosis not present

## 2021-06-26 DIAGNOSIS — G43109 Migraine with aura, not intractable, without status migrainosus: Secondary | ICD-10-CM | POA: Diagnosis not present

## 2021-06-27 DIAGNOSIS — G43109 Migraine with aura, not intractable, without status migrainosus: Secondary | ICD-10-CM | POA: Diagnosis not present

## 2021-06-28 DIAGNOSIS — G43109 Migraine with aura, not intractable, without status migrainosus: Secondary | ICD-10-CM | POA: Diagnosis not present

## 2021-06-29 DIAGNOSIS — G43109 Migraine with aura, not intractable, without status migrainosus: Secondary | ICD-10-CM | POA: Diagnosis not present

## 2021-06-30 DIAGNOSIS — G43109 Migraine with aura, not intractable, without status migrainosus: Secondary | ICD-10-CM | POA: Diagnosis not present

## 2021-07-01 DIAGNOSIS — G43109 Migraine with aura, not intractable, without status migrainosus: Secondary | ICD-10-CM | POA: Diagnosis not present

## 2021-07-02 DIAGNOSIS — G43109 Migraine with aura, not intractable, without status migrainosus: Secondary | ICD-10-CM | POA: Diagnosis not present

## 2021-07-05 DIAGNOSIS — G43109 Migraine with aura, not intractable, without status migrainosus: Secondary | ICD-10-CM | POA: Diagnosis not present

## 2021-07-06 DIAGNOSIS — G43109 Migraine with aura, not intractable, without status migrainosus: Secondary | ICD-10-CM | POA: Diagnosis not present

## 2021-07-07 DIAGNOSIS — G43109 Migraine with aura, not intractable, without status migrainosus: Secondary | ICD-10-CM | POA: Diagnosis not present

## 2021-07-08 ENCOUNTER — Other Ambulatory Visit: Payer: Self-pay

## 2021-07-08 ENCOUNTER — Encounter (HOSPITAL_COMMUNITY)
Admission: RE | Admit: 2021-07-08 | Discharge: 2021-07-08 | Disposition: A | Discharge status: Home / Self Care | Payer: Medicaid Other | Source: Ambulatory Visit | Attending: Neurosurgery | Admitting: Neurosurgery

## 2021-07-08 ENCOUNTER — Encounter (HOSPITAL_COMMUNITY): Payer: Self-pay

## 2021-07-08 VITALS — BP 139/72 | HR 80 | Temp 98.0°F | Resp 18 | Ht 61.0 in | Wt 148.5 lb

## 2021-07-08 DIAGNOSIS — G43109 Migraine with aura, not intractable, without status migrainosus: Secondary | ICD-10-CM | POA: Diagnosis not present

## 2021-07-08 DIAGNOSIS — Z01812 Encounter for preprocedural laboratory examination: Secondary | ICD-10-CM | POA: Diagnosis not present

## 2021-07-08 DIAGNOSIS — Z01818 Encounter for other preprocedural examination: Secondary | ICD-10-CM

## 2021-07-08 HISTORY — DX: Hypothyroidism, unspecified: E03.9

## 2021-07-08 LAB — BASIC METABOLIC PANEL
Anion gap: 6 (ref 5–15)
BUN: 10 mg/dL (ref 6–20)
CO2: 23 mmol/L (ref 22–32)
Calcium: 9.3 mg/dL (ref 8.9–10.3)
Chloride: 111 mmol/L (ref 98–111)
Creatinine, Ser: 0.62 mg/dL (ref 0.44–1.00)
GFR, Estimated: >60 mL/min (ref >60)
Glucose, Bld: 169 mg/dL — ABNORMAL HIGH (ref 70–99)
Potassium: 3.9 mmol/L (ref 3.5–5.1)
Sodium: 140 mmol/L (ref 135–145)

## 2021-07-08 LAB — TYPE AND SCREEN
ABO/RH(D): B POS
Antibody Screen: NEGATIVE

## 2021-07-08 LAB — GLUCOSE, CAPILLARY: Glucose-Capillary: 198 mg/dL — ABNORMAL HIGH (ref 70–99)

## 2021-07-08 LAB — HEMOGLOBIN A1C
Hgb A1c MFr Bld: 7.6 % — ABNORMAL HIGH (ref 4.8–5.6)
Mean Plasma Glucose: 171.42 mg/dL

## 2021-07-08 LAB — SURGICAL PCR SCREEN
MRSA, PCR: NEGATIVE
Staphylococcus aureus: NEGATIVE

## 2021-07-08 NOTE — Progress Notes (Addendum)
Surgical Instructions    Your procedure is scheduled on Wednesday June 14.  Report to [facility] Main Entrance "A" at 7:00 A.M., then check in with the Admitting office.  Call this number if you have problems the morning of surgery:  [phone]   If you have any questions prior to your surgery date call [phone]: Open Monday-Friday 8am-4pm    Remember:  Do not eat after midnight the night before your surgery  You may drink clear liquids until 6:00am the morning of your surgery.   Clear liquids allowed are: Water, Non-Citrus Juices (without pulp), Carbonated Beverages, Clear Tea, Black Coffee ONLY (NO MILK, CREAM OR POWDERED CREAMER of any kind), and Gatorade    Take these medicines the morning of surgery with A SIP OF WATER:  amLODipine (NORVASC)  DULoxetine (CYMBALTA) fluticasone (FLONASE) if needed gabapentin (NEURONTIN) levothyroxine (SYNTHROID)  loratadine (CLARITIN) if needed  As of today, STOP taking any Aspirin (unless otherwise instructed by your surgeon),meloxicam (MOBIC), Aleve, Naproxen, Ibuprofen, Motrin, Advil, Goody's, BC's, all herbal medications, fish oil, and all vitamins.  WHAT DO I DO ABOUT MY DIABETES MEDICATION?   Do not take oral diabetes medicines (pills) the morning of surgery. Do Not take metFORMIN (GLUCOPHAGE)  the day of surgery.  Do Not take empagliflozin (JARDIANCE) the day before surgery.       Do Not take empagliflozin (JARDIANCE) the day of surgery.       THE EVENING BEFORE SURGERY, take 6 units of insulin glargine (LANTUS SOLOSTAR) insulin.    HOW TO MANAGE YOUR DIABETES BEFORE AND AFTER SURGERY  Why is it important to control my blood sugar before and after surgery? Improving blood sugar levels before and after surgery helps healing and can limit problems. A way of improving blood sugar control is eating a healthy diet by:  Eating less sugar and carbohydrates  Increasing activity/exercise  Talking with your doctor about reaching  your blood sugar goals High blood sugars (greater than 180 mg/dL) can raise your risk of infections and slow your recovery, so you will need to focus on controlling your diabetes during the weeks before surgery. Make sure that the doctor who takes care of your diabetes knows about your planned surgery including the date and location.  How do I manage my blood sugar before surgery? Check your blood sugar at least 4 times a day, starting 2 days before surgery, to make sure that the level is not too high or low.  Check your blood sugar the morning of your surgery when you wake up and every 2 hours until you get to the Short Stay unit.  If your blood sugar is less than 70 mg/dL, you will need to treat for low blood sugar: Do not take insulin. Treat a low blood sugar (less than 70 mg/dL) with  cup of clear juice (cranberry or apple), 4 glucose tablets, OR glucose gel. Recheck blood sugar in 15 minutes after treatment (to make sure it is greater than 70 mg/dL). If your blood sugar is not greater than 70 mg/dL on recheck, call [phone] for further instructions. Report your blood sugar to the short stay nurse when you get to Short Stay.  If you are admitted to the hospital after surgery: Your blood sugar will be checked by the staff and you will probably be given insulin after surgery (instead of oral diabetes medicines) to make sure you have good blood sugar levels. The goal for blood sugar control after surgery is 80-180 mg/dL.  Do not wear jewelry or makeup Do not wear lotions, powders, perfumes/colognes, or deodorant. Do not shave 48 hours prior to surgery.  Men may shave face and neck. Do not bring valuables to the hospital. Do not wear nail polish, gel polish, artificial nails, or any other type of covering on natural nails (fingers and toes) If you have artificial nails or gel coating that need to be removed by a nail salon, please have this removed prior to surgery. Artificial  nails or gel coating may interfere with anesthesia's ability to adequately monitor your vital signs.  [facility] is not responsible for any belongings or valuables. .   Do NOT Smoke (Tobacco/Vaping)  24 hours prior to your procedure  If you use a CPAP at night, you may bring your mask for your overnight stay.   Contacts, glasses, hearing aids, dentures or partials may not be worn into surgery, please bring cases for these belongings   For patients admitted to the hospital, discharge time will be determined by your treatment team.   Patients discharged the day of surgery will not be allowed to drive home, and someone needs to stay with them for 24 hours.   SURGICAL WAITING ROOM VISITATION Patients having surgery or a procedure in a hospital may have two support people. Children under the age of 18 must have an adult with them who is not the patient. They may stay in the waiting area during the procedure and may switch out with other visitors. If the patient needs to stay at the hospital during part of their recovery, the visitor guidelines for inpatient rooms apply.  Please refer to the [facility] website for the visitor guidelines for Inpatients (after your surgery is over and you are in a regular room).       Special instructions:    Oral Hygiene is also important to reduce your risk of infection.  Remember - BRUSH YOUR TEETH THE MORNING OF SURGERY WITH YOUR REGULAR TOOTHPASTE   [place]- Preparing For Surgery  Before surgery, you can play an important role. Because skin is not sterile, your skin needs to be as free of germs as possible. You can reduce the number of germs on your skin by washing with CHG (chlorahexidine gluconate) Soap before surgery.  CHG is an antiseptic cleaner which kills germs and bonds with the skin to continue killing germs even after washing.     Please do not use if you have an allergy to CHG or antibacterial soaps. If your skin becomes  reddened/irritated stop using the CHG.  Do not shave (including legs and underarms) for at least 48 hours prior to first CHG shower. It is OK to shave your face.  Please follow these instructions carefully.     Shower the NIGHT BEFORE SURGERY and the MORNING OF SURGERY with CHG Soap.   If you chose to wash your hair, wash your hair first as usual with your normal shampoo. After you shampoo, rinse your hair and body thoroughly to remove the shampoo.  Then [employer] and genitals (private parts) with your normal soap and rinse thoroughly to remove soap.  After that Use CHG Soap as you would any other liquid soap. You can apply CHG directly to the skin and wash gently with a scrungie or a clean washcloth.   Apply the CHG Soap to your body ONLY FROM THE NECK DOWN.  Do not use on open wounds or open sores. Avoid contact with your eyes, ears, mouth and  genitals (private parts). Wash Face and genitals (private parts)  with your normal soap.   Wash thoroughly, paying special attention to the area where your surgery will be performed.  Thoroughly rinse your body with warm water from the neck down.  DO NOT shower/wash with your normal soap after using and rinsing off the CHG Soap.  Pat yourself dry with a CLEAN TOWEL.  Wear CLEAN PAJAMAS to bed the night before surgery  Place CLEAN SHEETS on your bed the night before your surgery  DO NOT SLEEP WITH PETS.   Day of Surgery:  Take a shower with CHG soap. Wear Clean/Comfortable clothing the morning of surgery Do not apply any deodorants/lotions.   Remember to brush your teeth WITH YOUR REGULAR TOOTHPASTE.    If you received a COVID test during your pre-op visit, it is requested that you wear a mask when out in public, stay away from anyone that may not be feeling well, and notify your surgeon if you develop symptoms. If you have been in contact with anyone that has tested positive in the last 10 days, please notify your surgeon.    Please  read over the following fact sheets that you were given.

## 2021-07-08 NOTE — Progress Notes (Signed)
PCP - Ladell Pier MD Cardiologist - denies  PPM/ICD - denies Device Orders -  Rep Notified -   Chest x-ray - none EKG - 03/11/21 Stress Test - none ECHO - none Cardiac Cath - none  Sleep Study - none CPAP -   Fasting Blood Sugar - 140's Checks Blood Sugar 2-3 times a day  Blood Thinner Instructions:na Aspirin Instructions:na  ERAS Protcol - clear liquids until 0600 PRE-SURGERY Ensure or G2-   COVID TEST- na   Anesthesia review:   Patient denies shortness of breath, fever, cough and chest pain at PAT appointment   All instructions explained to the patient, with a verbal understanding of the material. Patient agrees to go over the instructions while at home for a better understanding. Patient also instructed to wear a mask when out in public prior to surgery. The opportunity to ask questions was provided.

## 2021-07-09 DIAGNOSIS — G43109 Migraine with aura, not intractable, without status migrainosus: Secondary | ICD-10-CM | POA: Diagnosis not present

## 2021-07-10 DIAGNOSIS — G43109 Migraine with aura, not intractable, without status migrainosus: Secondary | ICD-10-CM | POA: Diagnosis not present

## 2021-07-11 DIAGNOSIS — G43109 Migraine with aura, not intractable, without status migrainosus: Secondary | ICD-10-CM | POA: Diagnosis not present

## 2021-07-12 DIAGNOSIS — G43109 Migraine with aura, not intractable, without status migrainosus: Secondary | ICD-10-CM | POA: Diagnosis not present

## 2021-07-13 ENCOUNTER — Encounter: Payer: Self-pay | Admitting: Dietician

## 2021-07-13 ENCOUNTER — Encounter: Payer: Medicaid Other | Attending: Internal Medicine | Admitting: Dietician

## 2021-07-13 DIAGNOSIS — G43109 Migraine with aura, not intractable, without status migrainosus: Secondary | ICD-10-CM | POA: Diagnosis not present

## 2021-07-13 NOTE — Progress Notes (Signed)
   Diabetes Self-Management Education  Visit Type:    Appt. Start Time: 1130 Appt. End Time: 1200  07/13/2021  April Mcmahon, identified by name and date of birth, is a 61 y.o. female with a diagnosis of Diabetes:  .   ASSESSMENT Patient is here today with her daughter-in-law. She was last seen by this RD on 06/04/2021 [employer] via language line Lynann Bologna [id number].  She is scheduled for neck surgery 07/15/2021 and states that therapy is helping her a lot and she is considering cancelling her surgery.  She requested that I call the office with the interpretor on line and did cancel this surgery appointment and will follow up with MD as needed.   Blood glucose meter reviewed and average blood glucose is 132. She is continuing to walk  History includes Type 2 Diabetes (>5 years), HTN, HLD, vitamin D deficiency Medications include:  Jardiance, Metformin, Lantus 12 units q HS, synthroid Labs noted:  A1C is 7% 01/16/2021 which has decreased from 10.9% 10/17/2020 and 11.3% 09/26/2020.  Choldesterol 219, HDL 56, LDL 109, Triglycerides 261 09/26/2020, GFR 103 10/2019.   Patient lives with husband, 3 children and 2 grandchildren. She is from [place] and has been in the [name] since 2011. She is unable to read.  Runs on the treadmill daily for 5-10 minutes. She does not eat chicken or other meats or eggs and does eat dairy, occasional fish. Uses small amounts of salt.  Avoids sweets.  Chooses whole grains and states that she tries to be mindful of portions.  Weight 149 lb (67.6 kg). Body mass index is 28.15 kg/m.    Individualized Plan for Diabetes Self-Management Training:   Learning Objective:  Patient will have a greater understanding of diabetes self-management. Patient education plan is to attend individual and/or group sessions per assessed needs and concerns.   Plan:   Patient Instructions  Blood Sugar Goals: 80-130 before breakfast Less than 180 2 hours after a meal  Continue  to walk.  Walking after most meals will lower your blood sugar.  Continue to eat plenty of vegetables. Continue to choose whole grains.  Watch portion size. Choose lentils or beans with each lunch and dinner.   Continue to drink plenty of water and avoid sweetened beverages. Limit added salt. Use only small amounts of oil in cooking.  Expected Outcomes:     Education material provided:   If problems or questions, patient to contact team via:  Phone  Future DSME appointment:

## 2021-07-13 NOTE — Patient Instructions (Addendum)
Blood Sugar Goals: 80-130 before breakfast Less than 180 2 hours after a meal   Continue to walk.  Walking after most meals will lower your blood sugar.   Continue to eat plenty of vegetables. Continue to choose whole grains.  Watch portion size. Choose lentils or beans with each lunch and dinner.   Continue to drink plenty of water and avoid sweetened beverages. Limit added salt. Use only small amounts of oil in cooking.   

## 2021-07-14 DIAGNOSIS — G43109 Migraine with aura, not intractable, without status migrainosus: Secondary | ICD-10-CM | POA: Diagnosis not present

## 2021-07-15 ENCOUNTER — Ambulatory Visit (HOSPITAL_COMMUNITY): Admission: RE | Admit: 2021-07-15 | Payer: Medicaid Other | Source: Home / Self Care | Admitting: Neurosurgery

## 2021-07-15 ENCOUNTER — Encounter (HOSPITAL_COMMUNITY): Admission: RE | Payer: Self-pay | Source: Home / Self Care

## 2021-07-15 DIAGNOSIS — G43109 Migraine with aura, not intractable, without status migrainosus: Secondary | ICD-10-CM | POA: Diagnosis not present

## 2021-07-15 SURGERY — ANTERIOR CERVICAL DECOMPRESSION/DISCECTOMY FUSION 2 LEVELS
Anesthesia: General

## 2021-07-16 DIAGNOSIS — G43109 Migraine with aura, not intractable, without status migrainosus: Secondary | ICD-10-CM | POA: Diagnosis not present

## 2021-07-17 DIAGNOSIS — G43109 Migraine with aura, not intractable, without status migrainosus: Secondary | ICD-10-CM | POA: Diagnosis not present

## 2021-07-18 DIAGNOSIS — G43109 Migraine with aura, not intractable, without status migrainosus: Secondary | ICD-10-CM | POA: Diagnosis not present

## 2021-07-19 DIAGNOSIS — G43109 Migraine with aura, not intractable, without status migrainosus: Secondary | ICD-10-CM | POA: Diagnosis not present

## 2021-07-20 DIAGNOSIS — G43109 Migraine with aura, not intractable, without status migrainosus: Secondary | ICD-10-CM | POA: Diagnosis not present

## 2021-07-21 DIAGNOSIS — G43109 Migraine with aura, not intractable, without status migrainosus: Secondary | ICD-10-CM | POA: Diagnosis not present

## 2021-07-22 DIAGNOSIS — G43109 Migraine with aura, not intractable, without status migrainosus: Secondary | ICD-10-CM | POA: Diagnosis not present

## 2021-07-23 DIAGNOSIS — G43109 Migraine with aura, not intractable, without status migrainosus: Secondary | ICD-10-CM | POA: Diagnosis not present

## 2021-07-24 ENCOUNTER — Encounter: Payer: Self-pay | Admitting: Internal Medicine

## 2021-07-24 ENCOUNTER — Ambulatory Visit: Payer: Medicaid Other | Admitting: Internal Medicine

## 2021-07-24 VITALS — BP 124/70 | HR 77 | Ht 61.0 in | Wt 150.8 lb

## 2021-07-24 DIAGNOSIS — E1142 Type 2 diabetes mellitus with diabetic polyneuropathy: Secondary | ICD-10-CM

## 2021-07-24 DIAGNOSIS — G43109 Migraine with aura, not intractable, without status migrainosus: Secondary | ICD-10-CM | POA: Diagnosis not present

## 2021-07-24 DIAGNOSIS — E785 Hyperlipidemia, unspecified: Secondary | ICD-10-CM

## 2021-07-24 MED ORDER — INSULIN PEN NEEDLE 32G X 4 MM MISC: 1.0000 | Freq: Every day | 3 refills | Status: DC

## 2021-07-24 MED ORDER — LANTUS SOLOSTAR 100 UNIT/ML ~~LOC~~ SOPN: 14.0000 [IU] | PEN_INJECTOR | Freq: Every day | SUBCUTANEOUS | 3 refills | Status: DC

## 2021-07-24 MED ORDER — ATORVASTATIN CALCIUM 20 MG PO TABS
20.0000 mg | ORAL_TABLET | Freq: Every day | ORAL | 3 refills | Status: DC
Start: 2021-07-24 — End: 2022-01-27

## 2021-07-24 MED ORDER — METFORMIN HCL 1000 MG PO TABS: 1000.0000 mg | ORAL_TABLET | Freq: Two times a day (BID) | ORAL | 3 refills | Status: DC

## 2021-07-24 MED ORDER — EMPAGLIFLOZIN 25 MG PO TABS
25.0000 mg | ORAL_TABLET | Freq: Every day | ORAL | 3 refills | Status: DC
Start: 2021-07-24 — End: 2022-01-27

## 2021-07-25 DIAGNOSIS — G43109 Migraine with aura, not intractable, without status migrainosus: Secondary | ICD-10-CM | POA: Diagnosis not present

## 2021-07-26 DIAGNOSIS — G43109 Migraine with aura, not intractable, without status migrainosus: Secondary | ICD-10-CM | POA: Diagnosis not present

## 2021-07-27 DIAGNOSIS — G43109 Migraine with aura, not intractable, without status migrainosus: Secondary | ICD-10-CM | POA: Diagnosis not present

## 2021-07-28 DIAGNOSIS — G43109 Migraine with aura, not intractable, without status migrainosus: Secondary | ICD-10-CM | POA: Diagnosis not present

## 2021-07-29 DIAGNOSIS — G43109 Migraine with aura, not intractable, without status migrainosus: Secondary | ICD-10-CM | POA: Diagnosis not present

## 2021-07-30 DIAGNOSIS — G43109 Migraine with aura, not intractable, without status migrainosus: Secondary | ICD-10-CM | POA: Diagnosis not present

## 2021-07-31 DIAGNOSIS — G43109 Migraine with aura, not intractable, without status migrainosus: Secondary | ICD-10-CM | POA: Diagnosis not present

## 2021-08-01 DIAGNOSIS — G43109 Migraine with aura, not intractable, without status migrainosus: Secondary | ICD-10-CM | POA: Diagnosis not present

## 2021-08-02 DIAGNOSIS — G43109 Migraine with aura, not intractable, without status migrainosus: Secondary | ICD-10-CM | POA: Diagnosis not present

## 2021-08-03 DIAGNOSIS — G43109 Migraine with aura, not intractable, without status migrainosus: Secondary | ICD-10-CM | POA: Diagnosis not present

## 2021-08-04 DIAGNOSIS — G43109 Migraine with aura, not intractable, without status migrainosus: Secondary | ICD-10-CM | POA: Diagnosis not present

## 2021-08-05 DIAGNOSIS — G43109 Migraine with aura, not intractable, without status migrainosus: Secondary | ICD-10-CM | POA: Diagnosis not present

## 2021-08-06 DIAGNOSIS — G43109 Migraine with aura, not intractable, without status migrainosus: Secondary | ICD-10-CM | POA: Diagnosis not present

## 2021-08-07 DIAGNOSIS — G43109 Migraine with aura, not intractable, without status migrainosus: Secondary | ICD-10-CM | POA: Diagnosis not present

## 2021-08-08 DIAGNOSIS — G43109 Migraine with aura, not intractable, without status migrainosus: Secondary | ICD-10-CM | POA: Diagnosis not present

## 2021-08-09 DIAGNOSIS — G43109 Migraine with aura, not intractable, without status migrainosus: Secondary | ICD-10-CM | POA: Diagnosis not present

## 2021-08-10 ENCOUNTER — Ambulatory Visit: Payer: Medicaid Other | Attending: Internal Medicine | Admitting: Internal Medicine

## 2021-08-10 DIAGNOSIS — G43109 Migraine with aura, not intractable, without status migrainosus: Secondary | ICD-10-CM | POA: Diagnosis not present

## 2021-08-10 DIAGNOSIS — R159 Full incontinence of feces: Secondary | ICD-10-CM | POA: Diagnosis not present

## 2021-08-10 DIAGNOSIS — N3941 Urge incontinence: Secondary | ICD-10-CM | POA: Diagnosis not present

## 2021-08-10 NOTE — Progress Notes (Signed)
Patient ID: April Mcmahon, female   DOB: Jul 05, 1960, 61 y.o.   MRN: [id number] Virtual Visit via Telephone Note  I connected with Farzana Rupa Ruppel on 08/10/2021 at 12:26 PM by telephone and verified that I am speaking with the correct person using two identifiers  Location: Patient: home Provider: office  Participants: Myself Patient.   Daughter-in-law, Enis Gash interprets.   I discussed the limitations, risks, security and privacy concerns of performing an evaluation and management service by telephone and the availability of in person appointments. I also discussed with the patient that there may be a patient responsible charge related to this service. The patient expressed understanding and agreed to proceed.   History of Present Illness: Pt with hx of DM neuropathy, HTN, HL, former tob dep, migraines, Vit D def, DJD knees and plantar fasciitis, Abn LFT with steatosis on U/S, BL CTS, hypothyroid.  Purpose of today's visit is request for incontinence supply.  We did received a form from a medical supply company for this purpose.  Patient reports that she has not been able to hold her urine well for the past 6 to 7 months.  When she gets the urge to urinate, she has to go right away and most of the times she is still not able to hold her urine until she gets to rest room.  According to her daughter-in-law she has been wetting the bed at nights if she does not wear an incontinence pad.  Sometimes has fecal incontinence on occasion.  They are requesting incontinence underwear and pads for her.   Observations/Objective: No direct observation done as this was a telephone encounter.  Assessment and Plan: 1. Urge incontinence of urine 2. Incontinence of feces, unspecified fecal incontinence type Will order incontinence supplies to include adult under garment and pads.    Follow Up Instructions: 1 mth for chronic ds management   I discussed the assessment and treatment plan with the patient.  The patient was provided an opportunity to ask questions and all were answered. The patient agreed with the plan and demonstrated an understanding of the instructions.   The patient was advised to call back or seek an in-person evaluation if the symptoms worsen or if the condition fails to improve as anticipated.  I  Spent 5 minutes on this telephone encounter  This note has been created with Surveyor, quantity. Any transcriptional errors are unintentional.  Karle Plumber, MD

## 2021-08-11 DIAGNOSIS — G43109 Migraine with aura, not intractable, without status migrainosus: Secondary | ICD-10-CM | POA: Diagnosis not present

## 2021-08-12 DIAGNOSIS — G43109 Migraine with aura, not intractable, without status migrainosus: Secondary | ICD-10-CM | POA: Diagnosis not present

## 2021-08-13 DIAGNOSIS — G43109 Migraine with aura, not intractable, without status migrainosus: Secondary | ICD-10-CM | POA: Diagnosis not present

## 2021-08-14 DIAGNOSIS — G43109 Migraine with aura, not intractable, without status migrainosus: Secondary | ICD-10-CM | POA: Diagnosis not present

## 2021-08-15 DIAGNOSIS — G43109 Migraine with aura, not intractable, without status migrainosus: Secondary | ICD-10-CM | POA: Diagnosis not present

## 2021-08-16 DIAGNOSIS — G43109 Migraine with aura, not intractable, without status migrainosus: Secondary | ICD-10-CM | POA: Diagnosis not present

## 2021-08-17 DIAGNOSIS — G43109 Migraine with aura, not intractable, without status migrainosus: Secondary | ICD-10-CM | POA: Diagnosis not present

## 2021-08-18 DIAGNOSIS — G43109 Migraine with aura, not intractable, without status migrainosus: Secondary | ICD-10-CM | POA: Diagnosis not present

## 2021-08-19 DIAGNOSIS — G43109 Migraine with aura, not intractable, without status migrainosus: Secondary | ICD-10-CM | POA: Diagnosis not present

## 2021-08-20 DIAGNOSIS — G43109 Migraine with aura, not intractable, without status migrainosus: Secondary | ICD-10-CM | POA: Diagnosis not present

## 2021-08-21 DIAGNOSIS — G43109 Migraine with aura, not intractable, without status migrainosus: Secondary | ICD-10-CM | POA: Diagnosis not present

## 2021-08-22 DIAGNOSIS — G43109 Migraine with aura, not intractable, without status migrainosus: Secondary | ICD-10-CM | POA: Diagnosis not present

## 2021-08-23 DIAGNOSIS — G43109 Migraine with aura, not intractable, without status migrainosus: Secondary | ICD-10-CM | POA: Diagnosis not present

## 2021-08-24 DIAGNOSIS — G43109 Migraine with aura, not intractable, without status migrainosus: Secondary | ICD-10-CM | POA: Diagnosis not present

## 2021-08-25 DIAGNOSIS — G43109 Migraine with aura, not intractable, without status migrainosus: Secondary | ICD-10-CM | POA: Diagnosis not present

## 2021-08-26 DIAGNOSIS — G43109 Migraine with aura, not intractable, without status migrainosus: Secondary | ICD-10-CM | POA: Diagnosis not present

## 2021-08-27 DIAGNOSIS — G43109 Migraine with aura, not intractable, without status migrainosus: Secondary | ICD-10-CM | POA: Diagnosis not present

## 2021-08-30 DIAGNOSIS — G43109 Migraine with aura, not intractable, without status migrainosus: Secondary | ICD-10-CM | POA: Diagnosis not present

## 2021-08-31 DIAGNOSIS — G43109 Migraine with aura, not intractable, without status migrainosus: Secondary | ICD-10-CM | POA: Diagnosis not present

## 2021-09-01 DIAGNOSIS — G43109 Migraine with aura, not intractable, without status migrainosus: Secondary | ICD-10-CM | POA: Diagnosis not present

## 2021-09-02 ENCOUNTER — Telehealth: Payer: Self-pay | Admitting: Internal Medicine

## 2021-09-02 DIAGNOSIS — G43109 Migraine with aura, not intractable, without status migrainosus: Secondary | ICD-10-CM | POA: Diagnosis not present

## 2021-09-02 NOTE — Telephone Encounter (Signed)
Caller checking on the status of incontinence supply order, faxed on 6/16, 7/6, 7/6 and 8/2 to [facility] fax # [phone]. Caller states forms were emailed to [email or web address] for [email or web address]. Please note when received.

## 2021-09-03 DIAGNOSIS — G43109 Migraine with aura, not intractable, without status migrainosus: Secondary | ICD-10-CM | POA: Diagnosis not present

## 2021-09-03 NOTE — Telephone Encounter (Signed)
[employer] has been contacted and they will be faxing a new order over for PCP to complete.

## 2021-09-06 DIAGNOSIS — G43109 Migraine with aura, not intractable, without status migrainosus: Secondary | ICD-10-CM | POA: Diagnosis not present

## 2021-09-07 DIAGNOSIS — G43109 Migraine with aura, not intractable, without status migrainosus: Secondary | ICD-10-CM | POA: Diagnosis not present

## 2021-09-07 DIAGNOSIS — R32 Unspecified urinary incontinence: Secondary | ICD-10-CM | POA: Diagnosis not present

## 2021-09-08 DIAGNOSIS — G43109 Migraine with aura, not intractable, without status migrainosus: Secondary | ICD-10-CM | POA: Diagnosis not present

## 2021-09-09 ENCOUNTER — Other Ambulatory Visit: Payer: Self-pay | Admitting: Internal Medicine

## 2021-09-09 DIAGNOSIS — G43109 Migraine with aura, not intractable, without status migrainosus: Secondary | ICD-10-CM | POA: Diagnosis not present

## 2021-09-09 DIAGNOSIS — R296 Repeated falls: Secondary | ICD-10-CM

## 2021-09-10 DIAGNOSIS — G43109 Migraine with aura, not intractable, without status migrainosus: Secondary | ICD-10-CM | POA: Diagnosis not present

## 2021-09-24 ENCOUNTER — Ambulatory Visit: Payer: Medicaid Other | Attending: Internal Medicine | Admitting: Internal Medicine

## 2021-09-24 ENCOUNTER — Encounter: Payer: Self-pay | Admitting: Internal Medicine

## 2021-09-24 VITALS — BP 129/78 | HR 65 | Temp 98.1°F | Ht 60.0 in | Wt 152.2 lb

## 2021-09-24 DIAGNOSIS — G8929 Other chronic pain: Secondary | ICD-10-CM

## 2021-09-24 DIAGNOSIS — E039 Hypothyroidism, unspecified: Secondary | ICD-10-CM | POA: Diagnosis not present

## 2021-09-24 DIAGNOSIS — Z23 Encounter for immunization: Secondary | ICD-10-CM | POA: Diagnosis not present

## 2021-09-24 DIAGNOSIS — I152 Hypertension secondary to endocrine disorders: Secondary | ICD-10-CM

## 2021-09-24 DIAGNOSIS — E1142 Type 2 diabetes mellitus with diabetic polyneuropathy: Secondary | ICD-10-CM | POA: Diagnosis not present

## 2021-09-24 DIAGNOSIS — E538 Deficiency of other specified B group vitamins: Secondary | ICD-10-CM

## 2021-09-24 DIAGNOSIS — E1159 Type 2 diabetes mellitus with other circulatory complications: Secondary | ICD-10-CM

## 2021-09-24 DIAGNOSIS — M542 Cervicalgia: Secondary | ICD-10-CM

## 2021-09-24 NOTE — Patient Instructions (Addendum)
Purchase vitamin B 12 supplement 1000 mcg from other the counter and take one daily.

## 2021-09-24 NOTE — Progress Notes (Signed)
Patient ID: April Mcmahon, female    DOB: 13-Nov-1960  MRN: [id number]  CC: Diabetes (/)   Subjective: April Mcmahon is a 61 y.o. female who presents for chronic disease management.   Daughter-in-law, Enis Gash, is with her and interprets Her concerns today include:  Pt with hx of DM neuropathy, HTN, HL, former tob dep, migraines, Vit D def, DJD knees and plantar fasciitis, Abn LFT with steatosis on U/S, BL CTS, hypothyroid, spinal stenosis cervical spine  DM: Lab Results  Component Value Date   HGBA1C 7.6 (H) 07/08/2021  Saw Dr. Leonette Monarch 07/2021.  Lantus increased to 14 units.  Continued on Metformin 1 gram BID and Jardiance 25 mg  Checks BS 2-3 times a day.  She has a glucometer with her.  Blood sugar readings before breakfast range has been 115-132.  Blood sugar readings at bedtime range is 104-179. Doing good with eating habits.  Walking daily. She completed P.T and found to be very helpful.  Reports that her neck pain has significantly decreased. Due for her diabetic eye exam.   Thyroid: Consistent with taking levothyroxine 50 mcg daily.  Last TSH level was 4.8 which was just slightly above normal range.  Denies any symptoms related to thyroid at this time.  HTN: Taking amlodipine 10 mg daily.  I think she was also supposed to be on valsartan 40 mg half a tablet daily but I do not see it on her medication list.  She limits salt in the foods.  No chest pains or shortness of breath.  No lower extremity edema. Dizziness has decreased.  She has not had any further falls.  She saw her neurologist Dr. Krista Blue in May of this year for the same.  Thought to be more positional related.  Started on Cymbalta for neuropathy symptoms in the feet.  Patient did not tolerate the medication.  It caused headache and vomiting.  She stopped the medicine.  Vitamin B12 level was noted to be 300 on blood test that were done.   HM:  Due for flu shot and diabetic eye exam.  Patient Active Problem List    Diagnosis Date Noted   DM type 2 with diabetic peripheral neuropathy ([place]) 06/15/2021   Dizziness 06/15/2021   Dyslipidemia 10/17/2020   Uncontrolled type 2 diabetes mellitus with peripheral neuropathy 10/17/2020   Hyperlipidemia associated with type 2 diabetes mellitus ([place]) 05/05/2020   Influenza vaccine needed 01/03/2020   Former smoker 01/03/2020   Acquired hypothyroidism 01/03/2020   Microalbuminuria 11/15/2018   Closed fracture of distal phalanx of great toe 08/23/2018   Muscle cramps 02/21/2018   Hepatic steatosis 12/23/2017   MCI (mild cognitive impairment) 02/14/2017   Diabetic peripheral neuropathy ([place]) 02/14/2017   Memory loss 12/27/2016   Dysfunctional uterine bleeding 08/30/2016   Analgesic rebound headache 06/24/2016   Esophageal dysphagia 06/21/2016   Tobacco dependence 01/20/2016   Plantar fasciitis, bilateral 12/24/2015   DJD (degenerative joint disease) of knee 06/20/2015   Type 2 diabetes mellitus with peripheral neuropathy ([place]) 06/19/2015   Essential hypertension 06/19/2015   Vitamin D deficiency 09/24/2013   Migraine 05/15/2013   Low back pain 08/18/2012     Current Outpatient Medications on File Prior to Visit  Medication Sig Dispense Refill   Accu-Chek Softclix Lancets lancets Check blood sugar three times daily. Dx E11.42 100 each 12   amLODipine (NORVASC) 10 MG tablet Take 1 tablet (10 mg total) by mouth daily. 30 tablet 11   atorvastatin (LIPITOR) 20 MG tablet  Take 1 tablet (20 mg total) by mouth daily. 90 tablet 3   Blood Glucose Monitoring Suppl (ACCU-CHEK GUIDE ME) w/Device KIT Check blood sugar three times daily. Dx E11.42 1 kit 0   diclofenac Sodium (VOLTAREN) 1 % GEL Apply 2 g topically 4 (four) times daily. 100 g 3   empagliflozin (JARDIANCE) 25 MG TABS tablet Take 1 tablet (25 mg total) by mouth daily before breakfast. 90 tablet 3   fluticasone (FLONASE) 50 MCG/ACT nasal spray Place 2 sprays into both nostrils daily. (Patient taking  differently: Place 2 sprays into both nostrils daily as needed for allergies.) 16 g 0   glucose blood (ACCU-CHEK GUIDE) test strip Use as instructed 100 each 12   insulin glargine (LANTUS SOLOSTAR) 100 UNIT/ML Solostar Pen Inject 14 Units into the skin daily. 15 mL 3   Insulin Pen Needle 32G X 4 MM MISC 1 Device by Does not apply route daily in the afternoon. 100 each 3   levothyroxine (SYNTHROID) 50 MCG tablet TAKE 1 TABLET BY MOUTH EVERY DAY 90 tablet 2   loratadine (CLARITIN) 10 MG tablet Take 1 tablet (10 mg total) by mouth daily. (Patient taking differently: Take 10 mg by mouth at bedtime as needed for allergies.) 30 tablet 3   meloxicam (MOBIC) 15 MG tablet Take 1 tablet (15 mg total) by mouth daily. For arthritis pain 30 tablet 6   metFORMIN (GLUCOPHAGE) 1000 MG tablet Take 1 tablet (1,000 mg total) by mouth 2 (two) times daily with a meal. 180 tablet 3   gabapentin (NEURONTIN) 600 MG tablet Take 0.5 tablets (300 mg total) by mouth 3 (three) times daily. 45 tablet 1   [DISCONTINUED] venlafaxine (EFFEXOR) 37.5 MG tablet Take 1 tablet (37.5 mg total) by mouth 2 (two) times daily with a meal. 60 tablet 11   No current facility-administered medications on file prior to visit.    Allergies  Allergen Reactions   Elavil [Amitriptyline Hcl] Diarrhea and Nausea And Vomiting    Social History   Socioeconomic History   Marital status: Married    Spouse name: Not on file   Number of children: Not on file   Years of education: Not on file   Highest education level: Not on file  Occupational History   Not on file  Tobacco Use   Smoking status: Former   Smokeless tobacco: Never  Vaping Use   Vaping Use: Never used  Substance and Sexual Activity   Alcohol use: No   Drug use: No   Sexual activity: Not on file  Other Topics Concern   Not on file  Social History Narrative   Not on file   Social Determinants of Health   Financial Resource Strain: Not on file  Food Insecurity: Not on  file  Transportation Needs: Not on file  Physical Activity: Not on file  Stress: Not on file  Social Connections: Not on file  Intimate Partner Violence: Not on file    Family History  Problem Relation Age of Onset   Migraines Mother    Esophageal cancer Other    Breast cancer Neg Hx    Colon cancer Neg Hx    Rectal cancer Neg Hx    Stomach cancer Neg Hx     Past Surgical History:  Procedure Laterality Date   no previous surgeries      ROS: Review of Systems Negative except as stated above  PHYSICAL EXAM: BP 129/78   Pulse 65   Temp 98.1 F (36.7  C) (Oral)   Ht 5' (1.524 m)   Wt 152 lb 3.2 oz (69 kg)   SpO2 97%   BMI 29.72 kg/m   / Wt Readings from Last 3 Encounters:  09/24/21 152 lb 3.2 oz (69 kg)  07/24/21 150 lb 12.8 oz (68.4 kg)  07/13/21 149 lb (67.6 kg)    Physical Exam  General appearance - alert, well appearing, and in no distress Mental status - normal mood, behavior, speech, dress, motor activity, and thought processes Neck -no cervical lymphadenopathy.  No thyroid enlargement. Chest - clear to auscultation, no wheezes, rales or rhonchi, symmetric air entry Heart - normal rate, regular rhythm, normal S1, S2, no murmurs, rubs, clicks or gallops Extremities - peripheral pulses normal, no pedal edema, no clubbing or cyanosis Diabetic Foot Exam - Simple   Simple Foot Form Diabetic Foot exam was performed with the following findings: Yes 09/24/2021 12:32 PM  Visual Inspection No deformities, no ulcerations, no other skin breakdown bilaterally: Yes Sensation Testing Intact to touch and monofilament testing bilaterally: Yes Pulse Check Posterior Tibialis and Dorsalis pulse intact bilaterally: Yes Comments         Latest Ref Rng & Units 07/08/2021    2:13 PM 12/22/2020   10:14 AM 05/09/2020   10:06 AM  CMP  Glucose 70 - 99 mg/dL 169  90  302   BUN 6 - 20 mg/dL 10  10  6    Creatinine 0.44 - 1.00 mg/dL 0.62  0.66  0.82   Sodium 135 - 145 mmol/L  140  144  138   Potassium 3.5 - 5.1 mmol/L 3.9  4.5  4.7   Chloride 98 - 111 mmol/L 111  106  101   CO2 22 - 32 mmol/L 23  22  19    Calcium 8.9 - 10.3 mg/dL 9.3  9.6  9.5   Total Protein 6.0 - 8.5 g/dL  7.7  7.5   Total Bilirubin 0.0 - 1.2 mg/dL  0.5  0.4   Alkaline Phos 44 - 121 IU/L  126  145   AST 0 - 40 IU/L  27  30   ALT 0 - 32 IU/L  32  35    Lipid Panel     Component Value Date/Time   CHOL 210 (H) 09/26/2020 1104   TRIG 261 (H) 09/26/2020 1104   HDL 56 09/26/2020 1104   CHOLHDL 3.8 09/26/2020 1104   CHOLHDL 3.3 10/20/2015 1625   VLDL 46 (H) 10/20/2015 1625   LDLCALC 109 (H) 09/26/2020 1104    CBC    Component Value Date/Time   WBC 8.1 06/15/2021 1615   WBC 9.7 10/10/2017 1941   RBC 4.72 06/15/2021 1615   RBC 4.81 10/10/2017 1941   HGB 12.7 06/15/2021 1615   HCT 37.1 06/15/2021 1615   PLT 259 01/03/2020 1616   MCV 79 06/15/2021 1615   MCH 26.9 06/15/2021 1615   MCH 26.4 10/10/2017 1941   MCHC 34.2 06/15/2021 1615   MCHC 31.0 10/10/2017 1941   RDW 12.7 06/15/2021 1615   LYMPHSABS 3.2 (H) 06/15/2021 1615   MONOABS 335 10/20/2015 1625   EOSABS 0.2 06/15/2021 1615   BASOSABS 0.0 06/15/2021 1615    ASSESSMENT AND PLAN:  1. Type 2 diabetes mellitus with peripheral neuropathy (H[facility]) Reported blood sugar readings are at goal. Continue Lantus insulin 14 units daily, metformin 1 g twice a day and Jardiance 25 mg daily.  Encourage healthy eating habits.  Continue to stay active. - Microalbumin /  creatinine urine ratio - Ambulatory referral to Ophthalmology  2. Hypertension associated with type 2 diabetes mellitus ([place]) At goal. Bring all medicines with her at next visit to clarify whether she has Diovan or not.  At this time I see just the Norvasc 10 mg on her medication list which she reports taking.  3. Acquired hypothyroidism Continue levothyroxine.  Recheck level today. - TSH  4. Vitamin B12 deficiency She is relative B12 deficiency with a level in the low  normal range.  I recommend that she purchase vitamin B12 supplement 1000 mcg over-the-counter and take 1 daily.  Long-term use of metformin can cause B12 deficiency.  5. Chronic neck pain Significantly improved post physical therapy.  6. Need for immunization against influenza - Flu Vaccine QUAD 79moIM (Fluarix, Fluzone & Alfiuria Quad PF)  Patient's daughter-in-law told me that [place] be going to [place] and would like to know what vaccines she may need to get in regards to that travel.  She has not been there in 12 years.  I recommend that they call the [facility] travel [facility] and get an appointment there prior to her travels.  Patient was given the opportunity to ask questions.  Patient verbalized understanding of the plan and was able to repeat key elements of the plan.   This documentation was completed using DRadio producer  Any transcriptional errors are unintentional.  Orders Placed This Encounter  Procedures   Flu Vaccine QUAD 669moM (Fluarix, Fluzone & Alfiuria Quad PF)   TSH   Microalbumin / creatinine urine ratio   Ambulatory referral to Ophthalmology     Requested Prescriptions    No prescriptions requested or ordered in this encounter    Return in about 4 months (around 01/24/2022).  DeKarle PlumberMD, FACP

## 2021-09-25 LAB — MICROALBUMIN / CREATININE URINE RATIO
Creatinine, Urine: 26.2 mg/dL
Microalb/Creat Ratio: 44 mg/g creat — ABNORMAL HIGH (ref 0–29)
Microalbumin, Urine: 11.4 ug/mL

## 2021-09-25 LAB — TSH: TSH: 2.3 u[IU]/mL (ref 0.450–4.500)

## 2021-10-07 ENCOUNTER — Telehealth: Payer: Self-pay | Admitting: Emergency Medicine

## 2021-10-07 NOTE — Telephone Encounter (Signed)
Copied from Agency [phone]. Topic: General - Other >> Oct 06, 2021  4:51 PM Oley Balm E wrote: Reason for CRM: Olivia Mackie called from [facility] called to check status on fax that was submitted on the 24th and the 29th. Please advise, this wqs for incontinence supplies   Best contact: [phone]

## 2021-10-07 NOTE — Telephone Encounter (Signed)
Forms reviewed, signed, and faxed to [facility]. ----DD,RMA

## 2021-10-15 ENCOUNTER — Encounter: Payer: Medicaid Other | Attending: Internal Medicine | Admitting: Dietician

## 2021-10-15 ENCOUNTER — Encounter: Payer: Self-pay | Admitting: Dietician

## 2021-10-15 DIAGNOSIS — E1142 Type 2 diabetes mellitus with diabetic polyneuropathy: Secondary | ICD-10-CM | POA: Diagnosis present

## 2021-10-15 NOTE — Progress Notes (Signed)
Diabetes Self-Management Education  Visit Type: Follow-up  Appt. Start Time: 1630 Appt. End Time: 1700  10/15/2021  [name], identified by name and date of birth, is a 61 y.o. female with a diagnosis of Diabetes:  .   ASSESSMENT Patient is here today with her daughter-in-law and her interpretor Baxter Flattery from [employer]. Patient was last seen in our office on 07/13/2021. She is checking her blood glucose twice per day and was 139 this am. She needs lancets. She continues to walk daily for 10-15 minutes bid.  She gardens each morning as well.  Feels dizzy at times and no longer feels safe on the treadmill. Diet includes plenty of vegetables  History includes Type 2 Diabetes (>5 years), HTN, HLD, vitamin D deficiency Medications include:  Jardiance, Metformin, Lantus 12 units q HS, synthroid Labs noted:  A1C is 7% 01/16/2021 which has decreased from 10.9% 10/17/2020 and 11.3% 09/26/2020.  Choldesterol 219, HDL 56, LDL 109, Triglycerides 261 09/26/2020, GFR 103 10/2019.   Patient lives with husband, 3 children and 2 grandchildren. She is from [place] and has been in the [name] since 2011. She is unable to read.  Walks daily for 10-15 minutes bid. She does not eat chicken or other meats or eggs and does eat dairy, occasional fish. Uses small amounts of salt.  Avoids sweets.  Chooses whole grains and states that she tries to be mindful of portions. Continues to use small amounts of added salt on fresh vegetables Weight 152 lb (68.9 kg). Body mass index is 29.69 kg/m.   Diabetes Self-Management Education - 10/15/21 1740       Visit Information   Visit Type Follow-up      Psychosocial Assessment   Patient Belief/Attitude about Diabetes Motivated to manage diabetes    Self-care barriers English as a second language    Self-management support Doctor's office;Family;CDE visits    Other persons present Patient;Interpreter;Family Member    Patient Concerns Nutrition/Meal  planning;Healthy Lifestyle;Glycemic Control    Special Needs Verbal instruction    Preferred Learning Style Auditory    Learning Readiness Ready    How often do you need to have someone help you when you read instructions, pamphlets, or other written materials from your doctor or pharmacy? 5 - Always   unable to read     Pre-Education Assessment   Patient understands the diabetes disease and treatment process. Comprehends key points    Patient understands incorporating nutritional management into lifestyle. Comprehends key points    Patient undertands incorporating physical activity into lifestyle. Comprehends key points    Patient understands using medications safely. Comprehends key points    Patient understands monitoring blood glucose, interpreting and using results Comprehends key points    Patient understands prevention, detection, and treatment of acute complications. Comprehends key points    Patient understands prevention, detection, and treatment of chronic complications. Compreheands key points    Patient understands how to develop strategies to address psychosocial issues. Comprehends key points    Patient understands how to develop strategies to promote health/change behavior. Comprehends key points      Complications   How often do you check your blood sugar? 1-2 times/day    Fasting Blood glucose range (mg/dL) 130-179      Dietary Intake   Breakfast coffee or tea, bread and smoothie made with avocado, zucchini, fruit, water, occasional yogurt    Lunch brown rice, lentil soup    Dinner corn, potato and bitter melon, occasional tortillas,  fresh vegetables    Beverage(s) water, unsweetened hot tea, coffee      Activity / Exercise   Activity / Exercise Type Light (walking / raking leaves)    How many days per week do you exercise? 7    How many minutes per day do you exercise? 30    Total minutes per week of exercise 210      Patient Education   Previous Diabetes  Education Yes (please comment)   07/2021   Healthy Eating Other (comment)   reviewed   Being Active Role of exercise on diabetes management, blood pressure control and cardiac health.    Medications Reviewed patients medication for diabetes, action, purpose, timing of dose and side effects.    Monitoring Taught/evaluated SMBG meter.      Individualized Goals (developed by patient)   Nutrition General guidelines for healthy choices and portions discussed    Physical Activity Exercise 5-7 days per week;30 minutes per day    Medications take my medication as prescribed    Monitoring  Test my blood glucose as discussed    Reducing Risk treat hypoglycemia with 15 grams of carbs if blood glucose less than '70mg'$ /dL      Patient Self-Evaluation of Goals - Patient rates self as meeting previously set goals (% of time)   Nutrition >75% (most of the time)    Physical Activity >75% (most of the time)    Medications >75% (most of the time)    Monitoring >75% (most of the time)    Problem Solving and behavior change strategies  25 - 50% (sometimes)    Reducing Risk (treating acute and chronic complications) >70% (most of the time)    Health Coping >75% (most of the time)      Post-Education Assessment   Patient understands the diabetes disease and treatment process. Comprehends key points    Patient understands incorporating nutritional management into lifestyle. Comprehends key points    Patient undertands incorporating physical activity into lifestyle. Comprehends key points    Patient understands using medications safely. Comphrehends key points    Patient understands monitoring blood glucose, interpreting and using results Comprehends key points    Patient understands prevention, detection, and treatment of acute complications. Comprehends key points    Patient understands prevention, detection, and treatment of chronic complications. Comprehends key points    Patient understands how to develop  strategies to address psychosocial issues. Comprehends key points    Patient understands how to develop strategies to promote health/change behavior. Comprehends key points      Outcomes   Expected Outcomes Demonstrated interest in learning. Expect positive outcomes    Future DMSE 3-4 months    Program Status Not Completed      Subsequent Visit   Since your last visit have you continued or begun to take your medications as prescribed? Yes    Since your last visit have you experienced any weight changes? No change             Individualized Plan for Diabetes Self-Management Training:   Learning Objective:  Patient will have a greater understanding of diabetes self-management. Patient education plan is to attend individual and/or group sessions per assessed needs and concerns.   Plan:   Patient Instructions   Patient Instructions  Blood Sugar Goals: 80-130 before breakfast Less than 180 2 hours after a meal   Continue to walk.  Walking after most meals will lower your blood sugar.   Continue to eat plenty  of vegetables. Continue to choose whole grains.  Watch portion size. Choose lentils or beans with each lunch and dinner.   Continue to drink plenty of water and avoid sweetened beverages. Limit added salt. Use only small amounts of oil in cooking.    Expected Outcomes:  Demonstrated interest in learning. Expect positive outcomes  Education material provided:   If problems or questions, patient to contact team via:  Phone  Future DSME appointment: 3-4 months

## 2021-10-15 NOTE — Patient Instructions (Addendum)
Patient Instructions  Blood Sugar Goals: 80-130 before breakfast Less than 180 2 hours after a meal   Continue to walk.  Walking after most meals will lower your blood sugar.   Continue to eat plenty of vegetables. Continue to choose whole grains.  Watch portion size. Choose lentils or beans with each lunch and dinner.   Continue to drink plenty of water and avoid sweetened beverages. Limit added salt. Use only small amounts of oil in cooking.

## 2021-10-16 ENCOUNTER — Other Ambulatory Visit: Payer: Self-pay

## 2021-10-16 DIAGNOSIS — E1142 Type 2 diabetes mellitus with diabetic polyneuropathy: Secondary | ICD-10-CM

## 2021-10-16 MED ORDER — ACCU-CHEK SOFTCLIX LANCETS MISC: 12 refills | Status: DC

## 2021-10-30 ENCOUNTER — Encounter (HOSPITAL_COMMUNITY): Payer: Self-pay

## 2021-10-30 ENCOUNTER — Ambulatory Visit (HOSPITAL_COMMUNITY)
Admission: EM | Admit: 2021-10-30 | Discharge: 2021-10-30 | Disposition: A | Discharge status: Home / Self Care | Payer: Medicaid Other | Attending: Emergency Medicine | Admitting: Emergency Medicine

## 2021-10-30 ENCOUNTER — Ambulatory Visit (INDEPENDENT_AMBULATORY_CARE_PROVIDER_SITE_OTHER): Payer: Medicaid Other

## 2021-10-30 DIAGNOSIS — Z20822 Contact with and (suspected) exposure to covid-19: Secondary | ICD-10-CM | POA: Diagnosis not present

## 2021-10-30 DIAGNOSIS — E1165 Type 2 diabetes mellitus with hyperglycemia: Secondary | ICD-10-CM | POA: Diagnosis not present

## 2021-10-30 DIAGNOSIS — R739 Hyperglycemia, unspecified: Secondary | ICD-10-CM | POA: Insufficient documentation

## 2021-10-30 DIAGNOSIS — S9031XA Contusion of right foot, initial encounter: Secondary | ICD-10-CM

## 2021-10-30 DIAGNOSIS — Z8679 Personal history of other diseases of the circulatory system: Secondary | ICD-10-CM

## 2021-10-30 DIAGNOSIS — Z8639 Personal history of other endocrine, nutritional and metabolic disease: Secondary | ICD-10-CM | POA: Diagnosis present

## 2021-10-30 DIAGNOSIS — J069 Acute upper respiratory infection, unspecified: Secondary | ICD-10-CM | POA: Diagnosis present

## 2021-10-30 LAB — RESP PANEL BY RT-PCR (FLU A&B, COVID) ARPGX2
Influenza A by PCR: NEGATIVE
Influenza B by PCR: NEGATIVE
SARS Coronavirus 2 by RT PCR: NEGATIVE

## 2021-10-30 LAB — CBG MONITORING, ED: Glucose-Capillary: 305 mg/dL — ABNORMAL HIGH (ref 70–99)

## 2021-10-30 MED ORDER — ACETAMINOPHEN 325 MG PO TABS
ORAL_TABLET | ORAL | Status: AC
  Filled 2021-10-30: qty 3

## 2021-10-30 MED ORDER — ACETAMINOPHEN 325 MG PO TABS
975.0000 mg | ORAL_TABLET | Freq: Once | ORAL | Status: AC
  Administered 2021-10-30: 975 mg via ORAL

## 2021-10-30 NOTE — ED Triage Notes (Signed)
Pt has a cough for over 1wk, headache x3days,  right foot pain  due to a glass dropped on foot x3days causing pain and discomfort. Pt complains of ongoing back pain that has flared up within the last three days .

## 2021-10-30 NOTE — Discharge Instructions (Addendum)
Your xray is negative, you have a bruise of foot. Rest,ice,elevate. Drink plenty of water. Please quarantine until results are known, check MyChart.  May take over-the-counter Tylenol and Coricidin HBP as label directed.  Follow-up with PCP, return to urgent care as needed if you develop chest pain or shortness of breath nausea,vomiting,etc go to ER for further evaluation.

## 2021-10-30 NOTE — ED Provider Notes (Signed)
Frannie    CSN: [id number] Arrival date & time: 10/30/21  1508      History   Chief Complaint Chief Complaint  Patient presents with   Cough   Headache   Foot Pain    HPI Amneet Rupa Pieper is a 61 y.o. female.   61 year old female, Maddyson Keil, presents to urgent care with chief complaint of cough x1 week, headache x3 days, right foot pain x3 days after dropping a glass bottle on her foot.  No treatment tried prior to arrival, patient states her blood sugar has been running about 125, takes metformin.  The history is provided by the patient. No language interpreter was used.    Past Medical History:  Diagnosis Date   Bilateral chronic knee pain 06/2014   Chronic low back pain 06/2014   Chronic migraine 02/1985   Depression    hx of   Diabetes mellitus without complication ([place]) 35/3299   Fall    Hyperlipidemia    Hypertension 09/2013   Hypothyroidism    Shoulder pain     Patient Active Problem List   Diagnosis Date Noted   Contusion of right foot 10/30/2021   Viral URI with cough 10/30/2021   History of diabetes mellitus, type II 10/30/2021   History of hypertension 10/30/2021   Hyperglycemia 10/30/2021   Vitamin B12 deficiency 09/24/2021   DM type 2 with diabetic peripheral neuropathy ([place]) 06/15/2021   Dizziness 06/15/2021   Dyslipidemia 10/17/2020   Uncontrolled type 2 diabetes mellitus with peripheral neuropathy 10/17/2020   Hyperlipidemia associated with type 2 diabetes mellitus ([place]) 05/05/2020   Influenza vaccine needed 01/03/2020   Former smoker 01/03/2020   Acquired hypothyroidism 01/03/2020   Microalbuminuria 11/15/2018   Closed fracture of distal phalanx of great toe 08/23/2018   Muscle cramps 02/21/2018   Hepatic steatosis 12/23/2017   MCI (mild cognitive impairment) 02/14/2017   Diabetic peripheral neuropathy ([place]) 02/14/2017   Memory loss 12/27/2016   Dysfunctional uterine bleeding 08/30/2016   Analgesic rebound headache  06/24/2016   Esophageal dysphagia 06/21/2016   Tobacco dependence 01/20/2016   Plantar fasciitis, bilateral 12/24/2015   DJD (degenerative joint disease) of knee 06/20/2015   Type 2 diabetes mellitus with peripheral neuropathy ([place]) 06/19/2015   Essential hypertension 06/19/2015   Vitamin D deficiency 09/24/2013   Migraine 05/15/2013   Low back pain 08/18/2012    Past Surgical History:  Procedure Laterality Date   no previous surgeries      OB History   No obstetric history on file.      Home Medications    Prior to Admission medications   Medication Sig Start Date End Date Taking? Authorizing Provider  Accu-Chek Softclix Lancets lancets Check blood sugar three times daily. Dx E11.42 10/01/20   Ladell Pier, MD  Accu-Chek Softclix Lancets lancets Use as instructed to Check blood sugar three times daily 10/16/21   Shamleffer, Melanie Crazier, MD  amLODipine (NORVASC) 10 MG tablet Take 1 tablet (10 mg total) by mouth daily. 12/22/20   Ladell Pier, MD  amLODipine (NORVASC) 10 MG tablet Take 1 tablet by mouth daily.    [provider]  atorvastatin (LIPITOR) 20 MG tablet Take 1 tablet (20 mg total) by mouth daily. 07/24/21   Shamleffer, Melanie Crazier, MD  Blood Glucose Monitoring Suppl (ACCU-CHEK GUIDE ME) w/Device KIT Check blood sugar three times daily. Dx E11.42 10/01/20   Ladell Pier, MD  diclofenac Sodium (VOLTAREN) 1 % GEL Apply 2 g topically 4 (  four) times daily. 01/03/20   Ladell Pier, MD  empagliflozin (JARDIANCE) 25 MG TABS tablet Take 1 tablet (25 mg total) by mouth daily before breakfast. 07/24/21   Shamleffer, Melanie Crazier, MD  fluticasone (FLONASE) 50 MCG/ACT nasal spray Place 2 sprays into both nostrils daily. Patient taking differently: Place 2 sprays into both nostrils daily as needed for allergies. 03/13/21   Gildardo Pounds, NP  gabapentin (NEURONTIN) 600 MG tablet Take 0.5 tablets (300 mg total) by mouth 3 (three) times daily.  03/13/21 07/02/21  Gildardo Pounds, NP  glucose blood (ACCU-CHEK GUIDE) test strip Use as instructed 04/21/21   Ladell Pier, MD  insulin glargine (LANTUS SOLOSTAR) 100 UNIT/ML Solostar Pen Inject 14 Units into the skin daily. 07/24/21   Shamleffer, Melanie Crazier, MD  Insulin Pen Needle 32G X 4 MM MISC 1 Device by Does not apply route daily in the afternoon. 07/24/21   Shamleffer, Melanie Crazier, MD  levothyroxine (SYNTHROID) 50 MCG tablet TAKE 1 TABLET BY MOUTH EVERY DAY 02/27/21   Ladell Pier, MD  loratadine (CLARITIN) 10 MG tablet Take 1 tablet (10 mg total) by mouth daily. Patient taking differently: Take 10 mg by mouth at bedtime as needed for allergies. 04/21/21   Ladell Pier, MD  meloxicam (MOBIC) 15 MG tablet Take 1 tablet (15 mg total) by mouth daily. For arthritis pain 12/22/20   Ladell Pier, MD  metFORMIN (GLUCOPHAGE) 1000 MG tablet Take 1 tablet (1,000 mg total) by mouth 2 (two) times daily with a meal. 07/24/21   Shamleffer, Melanie Crazier, MD  venlafaxine (EFFEXOR) 37.5 MG tablet Take 1 tablet (37.5 mg total) by mouth 2 (two) times daily with a meal. 02/09/17 12/14/18  Marcial Pacas, MD    Family History Family History  Problem Relation Age of Onset   Migraines Mother    Esophageal cancer Other    Breast cancer Neg Hx    Colon cancer Neg Hx    Rectal cancer Neg Hx    Stomach cancer Neg Hx     Social History Social History   Tobacco Use   Smoking status: Former   Smokeless tobacco: Never  Vaping Use   Vaping Use: Never used  Substance Use Topics   Alcohol use: No   Drug use: No     Allergies   Elavil [amitriptyline hcl]   Review of Systems Review of Systems   Physical Exam Triage Vital Signs ED Triage Vitals  Enc Vitals Group     BP 10/30/21 1525 104/69     Pulse --      Resp 10/30/21 1525 12     Temp 10/30/21 1525 98.3 F (36.8 C)     Temp Source 10/30/21 1525 Oral     SpO2 10/30/21 1525 97 %     Weight 10/30/21 1523 150 lb (68  kg)     Height --      Head Circumference --      Peak Flow --      Pain Score 10/30/21 1522 10     Pain Loc --      Pain Edu? --      Excl. in [place]? --    No data found.  Updated Vital Signs BP 104/69 (BP Location: Left Arm)   Pulse 84   Temp 98.3 F (36.8 C) (Oral)   Resp 12   Wt 150 lb (68 kg)   SpO2 97%   BMI 29.29 kg/m   Visual Acuity Right  Eye Distance:   Left Eye Distance:   Bilateral Distance:    Right Eye Near:   Left Eye Near:    Bilateral Near:     Physical Exam   UC Treatments / Results  Labs (all labs ordered are listed, but only abnormal results are displayed) Labs Reviewed  CBG MONITORING, ED - Abnormal; Notable for the following components:      Result Value   Glucose-Capillary 305 (*)    All other components within normal limits  RESP PANEL BY RT-PCR (FLU A&B, COVID) [id number]    EKG   Radiology DG Foot Complete Right  Result Date: 10/30/2021 CLINICAL DATA:  Trauma 3 days ago with pain EXAM: RIGHT FOOT COMPLETE - 3+ VIEW COMPARISON:  09/30/2017 FINDINGS: No acute fracture or dislocation. Small calcaneal spur. No radiopaque foreign object. IMPRESSION: No acute osseous abnormality. Electronically Signed   By: Abigail Miyamoto M.D.   On: 10/30/2021 16:07    Procedures Procedures (including critical care time)  Medications Ordered in UC Medications  acetaminophen (TYLENOL) tablet 975 mg (975 mg Oral Given 10/30/21 1543)    Initial Impression / Assessment and Plan / UC Course  I have reviewed the triage vital signs and the nursing notes.  Pertinent labs & imaging results that were available during my care of the patient were reviewed by me and considered in my medical decision making (see chart for details).     Discussed elevate blood glucose of 305 with Dr. Windy Carina, pt does not appear septic: no fever, no tachycardia. Recommend pushing fluids, strict go to Er precautions given.  Patient verbalized understanding of care plan, strict ER  precautions given.  Ddx: Viral illness, DM, hyperglycemia, foot contusion Final Clinical Impressions(s) / UC Diagnoses   Final diagnoses:  Contusion of right foot, initial encounter  Viral URI with cough  History of diabetes mellitus, type II  History of hypertension  Hyperglycemia     Discharge Instructions      Your xray is negative, you have a bruise of foot. Rest,ice,elevate. Drink plenty of water. Please quarantine until results are known, check MyChart.  May take over-the-counter Tylenol and Coricidin HBP as label directed.  Follow-up with PCP, return to urgent care as needed if you develop chest pain or shortness of breath nausea,vomiting,etc go to ER for further evaluation.     ED Prescriptions   None    PDMP not reviewed this encounter.   Tori Milks, NP 23/55/73 Bosie Helper

## 2021-12-16 NOTE — Progress Notes (Deleted)
Patient: April Mcmahon Date of Birth: 09-Jun-1960  Reason for Visit: Follow up History from: Patient Primary Neurologist: Krista Blue   ASSESSMENT AND PLAN 61 y.o. year old female   1.  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  Neuropathic pain to bilateral feet   HISTORY  April Mcmahon is a 61 year old female, accompanied by her daughter-in-law, interpreter at today's visit, she is native of [place], seen in request by primary care doctor Karle Plumber for evaluation of dizziness   I reviewed and summarized the referring note. PMHX. HTN HLD DM- insulin dependent,    I saw her previously in 2019 for evaluation of memory loss.   She is negative of [place], moved to [place] in 2011, she was illiterate, has 6 children, lives with her son and his family, she was noted to have gradual onset memory loss since 2016, gradually getting worse, but when she cooks, she forgot to turn off the stove leading to fire alarm 3 times over the past 3 years, also missed taking her medications, but tends to repeat herself, get frustrated and agitated easily,   She is still independent in her daily activity otherwise, denied family history of dementia   Laboratory evaluation 2018 A1c 6.3, normal CBC, TSH, B12, C-reactive protein, ESR, RPR, HIV,   MRI of the brain in December 2018 which showed mild generalized atrophy   She lives with her son's family, complain few months history of frequent dizziness, mainly happening when she is standing up, denies vertigo, denies hearing loss, does complains of gradual worsening bilateral feet paresthesia for few years, starting at the plantar surface and toes, is at distal left shin level,   Laboratory in 2023: Normal TSH, A1c March 6 0.9, was 10 in September 22,  REVIEW OF SYSTEMS: Out of a complete 14 system review of symptoms, the patient complains only of the following symptoms, and all other reviewed systems are  negative.  See HPI  ALLERGIES: Allergies  Allergen Reactions   Elavil [Amitriptyline Hcl] Diarrhea and Nausea And Vomiting    HOME MEDICATIONS: Outpatient Medications Prior to Visit  Medication Sig Dispense Refill   Accu-Chek Softclix Lancets lancets Check blood sugar three times daily. Dx E11.42 100 each 12   Accu-Chek Softclix Lancets lancets Use as instructed to Check blood sugar three times daily 100 each 12   amLODipine (NORVASC) 10 MG tablet Take 1 tablet (10 mg total) by mouth daily. 30 tablet 11   amLODipine (NORVASC) 10 MG tablet Take 1 tablet by mouth daily.     atorvastatin (LIPITOR) 20 MG tablet Take 1 tablet (20 mg total) by mouth daily. 90 tablet 3   Blood Glucose Monitoring Suppl (ACCU-CHEK GUIDE ME) w/Device KIT Check blood sugar three times daily. Dx E11.42 1 kit 0   diclofenac Sodium (VOLTAREN) 1 % GEL Apply 2 g topically 4 (four) times daily. 100 g 3   empagliflozin (JARDIANCE) 25 MG TABS tablet Take 1 tablet (25 mg total) by mouth daily before breakfast. 90 tablet 3   fluticasone (FLONASE) 50 MCG/ACT nasal spray Place 2 sprays into both nostrils daily. (Patient taking differently: Place 2 sprays into both nostrils daily as needed for allergies.) 16 g 0   gabapentin (NEURONTIN) 600 MG tablet Take 0.5 tablets (300 mg total) by mouth 3 (three) times daily. 45 tablet 1   glucose blood (ACCU-CHEK GUIDE) test strip Use as instructed 100 each 12   insulin glargine (LANTUS SOLOSTAR) 100 UNIT/ML Solostar Pen  Inject 14 Units into the skin daily. 15 mL 3   Insulin Pen Needle 32G X 4 MM MISC 1 Device by Does not apply route daily in the afternoon. 100 each 3   levothyroxine (SYNTHROID) 50 MCG tablet TAKE 1 TABLET BY MOUTH EVERY DAY 90 tablet 2   loratadine (CLARITIN) 10 MG tablet Take 1 tablet (10 mg total) by mouth daily. (Patient taking differently: Take 10 mg by mouth at bedtime as needed for allergies.) 30 tablet 3   meloxicam (MOBIC) 15 MG tablet Take 1 tablet (15 mg total)  by mouth daily. For arthritis pain 30 tablet 6   metFORMIN (GLUCOPHAGE) 1000 MG tablet Take 1 tablet (1,000 mg total) by mouth 2 (two) times daily with a meal. 180 tablet 3   No facility-administered medications prior to visit.    PAST MEDICAL HISTORY: Past Medical History:  Diagnosis Date   Bilateral chronic knee pain 06/2014   Chronic low back pain 06/2014   Chronic migraine 02/1985   Depression    hx of   Diabetes mellitus without complication ([place]) 70/1779   Fall    Hyperlipidemia    Hypertension 09/2013   Hypothyroidism    Shoulder pain     PAST SURGICAL HISTORY: Past Surgical History:  Procedure Laterality Date   no previous surgeries      FAMILY HISTORY: Family History  Problem Relation Age of Onset   Migraines Mother    Esophageal cancer Other    Breast cancer Neg Hx    Colon cancer Neg Hx    Rectal cancer Neg Hx    Stomach cancer Neg Hx     SOCIAL HISTORY: Social History   Socioeconomic History   Marital status: Married    Spouse name: Not on file   Number of children: Not on file   Years of education: Not on file   Highest education level: Not on file  Occupational History   Not on file  Tobacco Use   Smoking status: Former   Smokeless tobacco: Never  Vaping Use   Vaping Use: Never used  Substance and Sexual Activity   Alcohol use: No   Drug use: No   Sexual activity: Not on file  Other Topics Concern   Not on file  Social History Narrative   Not on file   Social Determinants of Health   Financial Resource Strain: Not on file  Food Insecurity: Not on file  Transportation Needs: Not on file  Physical Activity: Not on file  Stress: Not on file  Social Connections: Not on file  Intimate Partner Violence: Not on file    PHYSICAL EXAM  There were no vitals filed for this visit. There is no height or weight on file to calculate BMI.  Generalized: Well developed, in no acute distress  Neurological examination  Mentation: Alert  oriented to time, place, history taking. Follows all commands speech and language fluent Cranial nerve II-XII: Pupils were equal round reactive to light. Extraocular movements were full, visual field were full on confrontational test. Facial sensation and strength were normal. Uvula tongue midline. Head turning and shoulder shrug  were normal and symmetric. Motor: The motor testing reveals 5 over 5 strength of all 4 extremities. Good symmetric motor tone is noted throughout.  Sensory: Sensory testing is intact to soft touch on all 4 extremities. No evidence of extinction is noted.  Coordination: Cerebellar testing reveals good finger-nose-finger and heel-to-shin bilaterally.  Gait and station: Gait is normal. Tandem gait is  normal. Romberg is negative. No drift is seen.  Reflexes: Deep tendon reflexes are symmetric and normal bilaterally.   DIAGNOSTIC DATA (LABS, IMAGING, TESTING) - I reviewed patient records, labs, notes, testing and imaging myself where available.  Lab Results  Component Value Date   WBC 8.1 06/15/2021   HGB 12.7 06/15/2021   HCT 37.1 06/15/2021   MCV 79 06/15/2021   PLT 259 01/03/2020      Component Value Date/Time   NA 140 07/08/2021 1413   NA 144 12/22/2020 1014   K 3.9 07/08/2021 1413   CL 111 07/08/2021 1413   CO2 23 07/08/2021 1413   GLUCOSE 169 (H) 07/08/2021 1413   BUN 10 07/08/2021 1413   BUN 10 12/22/2020 1014   CREATININE 0.62 07/08/2021 1413   CREATININE 0.62 10/20/2015 1625   CALCIUM 9.3 07/08/2021 1413   PROT 7.7 12/22/2020 1014   ALBUMIN 4.9 12/22/2020 1014   AST 27 12/22/2020 1014   ALT 32 12/22/2020 1014   ALKPHOS 126 (H) 12/22/2020 1014   BILITOT 0.5 12/22/2020 1014   GFRNONAA >60 07/08/2021 1413   GFRNONAA >89 09/03/2014 1219   GFRAA 119 10/03/2019 0909   GFRAA >89 09/03/2014 1219   Lab Results  Component Value Date   CHOL 210 (H) 09/26/2020   HDL 56 09/26/2020   LDLCALC 109 (H) 09/26/2020   TRIG 261 (H) 09/26/2020   CHOLHDL 3.8  09/26/2020   Lab Results  Component Value Date   HGBA1C 7.6 (H) 07/08/2021   Lab Results  Component Value Date   VITAMINB12 300 06/15/2021   Lab Results  Component Value Date   TSH 2.300 09/24/2021    Butler Denmark, AGNP-C, DNP 12/16/2021, 1:15 PM [facility] [address], [place], [place] [address] [phone]

## 2021-12-17 ENCOUNTER — Ambulatory Visit: Payer: Medicaid Other | Admitting: Neurology

## 2022-01-15 ENCOUNTER — Other Ambulatory Visit: Payer: Self-pay | Admitting: Internal Medicine

## 2022-01-15 DIAGNOSIS — E039 Hypothyroidism, unspecified: Secondary | ICD-10-CM

## 2022-01-19 ENCOUNTER — Encounter (HOSPITAL_COMMUNITY): Payer: Self-pay | Admitting: Emergency Medicine

## 2022-01-19 ENCOUNTER — Ambulatory Visit (INDEPENDENT_AMBULATORY_CARE_PROVIDER_SITE_OTHER): Payer: Medicaid Other

## 2022-01-19 ENCOUNTER — Ambulatory Visit (HOSPITAL_COMMUNITY)
Admission: EM | Admit: 2022-01-19 | Discharge: 2022-01-19 | Disposition: A | Discharge status: Home / Self Care | Payer: Medicaid Other | Attending: Family Medicine | Admitting: Family Medicine

## 2022-01-19 DIAGNOSIS — M25512 Pain in left shoulder: Secondary | ICD-10-CM

## 2022-01-19 MED ORDER — KETOROLAC TROMETHAMINE 30 MG/ML IJ SOLN
30.0000 mg | Freq: Once | INTRAMUSCULAR | Status: AC
  Administered 2022-01-19: 30 mg via INTRAMUSCULAR

## 2022-01-19 MED ORDER — MELOXICAM 15 MG PO TBDP: 15.0000 mg | ORAL_TABLET | Freq: Every day | ORAL | 0 refills | Status: DC | PRN

## 2022-01-19 MED ORDER — KETOROLAC TROMETHAMINE 30 MG/ML IJ SOLN
INTRAMUSCULAR | Status: AC
  Filled 2022-01-19: qty 1

## 2022-01-19 NOTE — Discharge Instructions (Signed)
The x-ray did not show any bony abnormality  Meloxicam 15 mg--1 tablet daily as needed for arthritis or joint pain

## 2022-01-19 NOTE — ED Provider Notes (Signed)
[facility]    CSN: [id number] Arrival date & time: 01/19/22  1251      History   Chief Complaint Chief Complaint  Patient presents with   Shoulder Pain    HPI April Mcmahon is a 61 y.o. female.    Shoulder Pain  Here for left shoulder pain.  It began about 4 days ago.  No trauma or fall.  No fever or cough or congestion.  Pain is radiating into her upper left arm.  There is no pain in her neck  No rash  She has had similar pain about 15 years ago  Past Medical History:  Diagnosis Date   Bilateral chronic knee pain 06/2014   Chronic low back pain 06/2014   Chronic migraine 02/1985   Depression    hx of   Diabetes mellitus without complication ([place]) 13/2440   Fall    Hyperlipidemia    Hypertension 09/2013   Hypothyroidism    Shoulder pain     Patient Active Problem List   Diagnosis Date Noted   Contusion of right foot 10/30/2021   Viral URI with cough 10/30/2021   History of diabetes mellitus, type II 10/30/2021   History of hypertension 10/30/2021   Hyperglycemia 10/30/2021   Vitamin B12 deficiency 09/24/2021   DM type 2 with diabetic peripheral neuropathy ([place]) 06/15/2021   Dizziness 06/15/2021   Dyslipidemia 10/17/2020   Uncontrolled type 2 diabetes mellitus with peripheral neuropathy 10/17/2020   Hyperlipidemia associated with type 2 diabetes mellitus ([place]) 05/05/2020   Influenza vaccine needed 01/03/2020   Former smoker 01/03/2020   Acquired hypothyroidism 01/03/2020   Microalbuminuria 11/15/2018   Closed fracture of distal phalanx of great toe 08/23/2018   Muscle cramps 02/21/2018   Hepatic steatosis 12/23/2017   MCI (mild cognitive impairment) 02/14/2017   Diabetic peripheral neuropathy ([place]) 02/14/2017   Memory loss 12/27/2016   Dysfunctional uterine bleeding 08/30/2016   Analgesic rebound headache 06/24/2016   Esophageal dysphagia 06/21/2016   Tobacco dependence 01/20/2016   Plantar fasciitis, bilateral 12/24/2015   DJD  (degenerative joint disease) of knee 06/20/2015   Type 2 diabetes mellitus with peripheral neuropathy ([place]) 06/19/2015   Essential hypertension 06/19/2015   Vitamin D deficiency 09/24/2013   Migraine 05/15/2013   Low back pain 08/18/2012    Past Surgical History:  Procedure Laterality Date   no previous surgeries      OB History   No obstetric history on file.      Home Medications    Prior to Admission medications   Medication Sig Start Date End Date Taking? Authorizing Provider  Meloxicam 15 MG TBDP Take 15 mg by mouth daily as needed (joint pain). 01/19/22  Yes Barrett Henle, MD  Accu-Chek Softclix Lancets lancets Check blood sugar three times daily. Dx E11.42 10/01/20   Ladell Pier, MD  Accu-Chek Softclix Lancets lancets Use as instructed to Check blood sugar three times daily 10/16/21   Shamleffer, Melanie Crazier, MD  amLODipine (NORVASC) 10 MG tablet Take 1 tablet (10 mg total) by mouth daily. 12/22/20   Ladell Pier, MD  amLODipine (NORVASC) 10 MG tablet Take 1 tablet by mouth daily.    [provider]  atorvastatin (LIPITOR) 20 MG tablet Take 1 tablet (20 mg total) by mouth daily. 07/24/21   Shamleffer, Melanie Crazier, MD  Blood Glucose Monitoring Suppl (ACCU-CHEK GUIDE ME) w/Device KIT Check blood sugar three times daily. Dx N02.72 10/01/20   Ladell Pier, MD  diclofenac Sodium (  VOLTAREN) 1 % GEL Apply 2 g topically 4 (four) times daily. 01/03/20   Ladell Pier, MD  empagliflozin (JARDIANCE) 25 MG TABS tablet Take 1 tablet (25 mg total) by mouth daily before breakfast. 07/24/21   Shamleffer, Melanie Crazier, MD  fluticasone (FLONASE) 50 MCG/ACT nasal spray Place 2 sprays into both nostrils daily. Patient taking differently: Place 2 sprays into both nostrils daily as needed for allergies. 03/13/21   Gildardo Pounds, NP  gabapentin (NEURONTIN) 600 MG tablet Take 0.5 tablets (300 mg total) by mouth 3 (three) times daily. 03/13/21 07/02/21   Gildardo Pounds, NP  glucose blood (ACCU-CHEK GUIDE) test strip Use as instructed 04/21/21   Ladell Pier, MD  insulin glargine (LANTUS SOLOSTAR) 100 UNIT/ML Solostar Pen Inject 14 Units into the skin daily. 07/24/21   Shamleffer, Melanie Crazier, MD  Insulin Pen Needle 32G X 4 MM MISC 1 Device by Does not apply route daily in the afternoon. 07/24/21   Shamleffer, Melanie Crazier, MD  levothyroxine (SYNTHROID) 50 MCG tablet TAKE 1 TABLET BY MOUTH EVERY DAY 01/15/22   Ladell Pier, MD  loratadine (CLARITIN) 10 MG tablet Take 1 tablet (10 mg total) by mouth daily. Patient taking differently: Take 10 mg by mouth at bedtime as needed for allergies. 04/21/21   Ladell Pier, MD  metFORMIN (GLUCOPHAGE) 1000 MG tablet Take 1 tablet (1,000 mg total) by mouth 2 (two) times daily with a meal. 07/24/21   Shamleffer, Melanie Crazier, MD  venlafaxine (EFFEXOR) 37.5 MG tablet Take 1 tablet (37.5 mg total) by mouth 2 (two) times daily with a meal. 02/09/17 12/14/18  Marcial Pacas, MD    Family History Family History  Problem Relation Age of Onset   Migraines Mother    Esophageal cancer Other    Breast cancer Neg Hx    Colon cancer Neg Hx    Rectal cancer Neg Hx    Stomach cancer Neg Hx     Social History Social History   Tobacco Use   Smoking status: Former   Smokeless tobacco: Never  Vaping Use   Vaping Use: Never used  Substance Use Topics   Alcohol use: No   Drug use: No     Allergies   Elavil [amitriptyline hcl]   Review of Systems Review of Systems   Physical Exam Triage Vital Signs ED Triage Vitals  Enc Vitals Group     BP 01/19/22 1715 (!) 158/86     Pulse Rate 01/19/22 1715 64     Resp 01/19/22 1715 18     Temp 01/19/22 1715 98 F (36.7 C)     Temp Source 01/19/22 1715 Oral     SpO2 01/19/22 1715 94 %     Weight --      Height --      Head Circumference --      Peak Flow --      Pain Score 01/19/22 1714 10     Pain Loc --      Pain Edu? --      Excl. in  [place]? --    No data found.  Updated Vital Signs BP (!) 158/86 (BP Location: Right Arm)   Pulse 64   Temp 98 F (36.7 C) (Oral)   Resp 18   SpO2 94%   Visual Acuity Right Eye Distance:   Left Eye Distance:   Bilateral Distance:    Right Eye Near:   Left Eye Near:    Bilateral Near:  Physical Exam Vitals reviewed.  Constitutional:      General: She is not in acute distress.    Appearance: She is not ill-appearing, toxic-appearing or diaphoretic.  HENT:     Mouth/Throat:     Mouth: Mucous membranes are moist.  Eyes:     Extraocular Movements: Extraocular movements intact.     Pupils: Pupils are equal, round, and reactive to light.  Cardiovascular:     Rate and Rhythm: Normal rate and regular rhythm.     Pulses: Normal pulses.     Heart sounds: No murmur heard. Pulmonary:     Effort: No respiratory distress.     Breath sounds: No stridor. No wheezing, rhonchi or rales.  Musculoskeletal:     Cervical back: Neck supple.     Comments: There is tenderness over the lateral shoulder and range of motion is limited by pain.  Lymphadenopathy:     Cervical: No cervical adenopathy.  Skin:    Capillary Refill: Capillary refill takes less than 2 seconds.     Coloration: Skin is not jaundiced or pale.     Findings: No rash.  Neurological:     General: No focal deficit present.     Mental Status: She is alert and oriented to person, place, and time.  Psychiatric:        Behavior: Behavior normal.      UC Treatments / Results  Labs (all labs ordered are listed, but only abnormal results are displayed) Labs Reviewed - No data to display  EKG   Radiology DG Shoulder Left  Result Date: 01/19/2022 CLINICAL DATA:  Left shoulder pain. EXAM: LEFT SHOULDER - 2+ VIEW COMPARISON:  December 13, 2018. FINDINGS: There is no evidence of fracture or dislocation. There is no evidence of arthropathy or other focal bone abnormality. Soft tissues are unremarkable. IMPRESSION: Negative.  Electronically Signed   By: Marijo Conception M.D.   On: 01/19/2022 18:20    Procedures Procedures (including critical care time)  Medications Ordered in UC Medications  ketorolac (TORADOL) 30 MG/ML injection 30 mg (has no administration in time range)    Initial Impression / Assessment and Plan / UC Course  I have reviewed the triage vital signs and the nursing notes.  Pertinent labs & imaging results that were available during my care of the patient were reviewed by me and considered in my medical decision making (see chart for details).        Her x-ray is negative.  She is given a shot of Toradol here.  She reports she is not taking any anti-inflammatories at this time, so I am sending her in some more meloxicam to take as needed.  Contact information for orthopedics is given Final Clinical Impressions(s) / UC Diagnoses   Final diagnoses:  Acute pain of left shoulder     Discharge Instructions      The x-ray did not show any bony abnormality  Meloxicam 15 mg--1 tablet daily as needed for arthritis or joint pain       ED Prescriptions     Medication Sig Dispense Auth. Provider   Meloxicam 15 MG TBDP Take 15 mg by mouth daily as needed (joint pain). 15 tablet , Gwenlyn Perking, MD      I have reviewed the PDMP during this encounter.   Barrett Henle, MD 01/19/22 1840

## 2022-01-19 NOTE — ED Triage Notes (Signed)
Pt reports left shoulder pain extending all the way to the elbow x 4 days.  Denies any obvious injuries.

## 2022-01-27 ENCOUNTER — Ambulatory Visit (INDEPENDENT_AMBULATORY_CARE_PROVIDER_SITE_OTHER): Payer: Medicaid Other | Admitting: Internal Medicine

## 2022-01-27 ENCOUNTER — Encounter: Payer: Self-pay | Admitting: Internal Medicine

## 2022-01-27 VITALS — BP 122/70 | HR 79 | Ht 60.0 in | Wt 151.0 lb

## 2022-01-27 DIAGNOSIS — E785 Hyperlipidemia, unspecified: Secondary | ICD-10-CM | POA: Diagnosis not present

## 2022-01-27 DIAGNOSIS — E1142 Type 2 diabetes mellitus with diabetic polyneuropathy: Secondary | ICD-10-CM

## 2022-01-27 LAB — POCT GLYCOSYLATED HEMOGLOBIN (HGB A1C): Hemoglobin A1C: 7.1 % — AB (ref 4.0–5.6)

## 2022-01-27 MED ORDER — LANTUS SOLOSTAR 100 UNIT/ML ~~LOC~~ SOPN: 14.0000 [IU] | PEN_INJECTOR | Freq: Every day | SUBCUTANEOUS | 3 refills | Status: DC

## 2022-01-27 MED ORDER — METFORMIN HCL 1000 MG PO TABS: 1000.0000 mg | ORAL_TABLET | Freq: Two times a day (BID) | ORAL | 3 refills | Status: DC

## 2022-01-27 MED ORDER — EMPAGLIFLOZIN 25 MG PO TABS: 25.0000 mg | ORAL_TABLET | Freq: Every day | ORAL | 3 refills | Status: DC

## 2022-01-27 MED ORDER — INSULIN PEN NEEDLE 32G X 4 MM MISC: 1.0000 | Freq: Every day | 3 refills | Status: DC

## 2022-01-27 MED ORDER — ATORVASTATIN CALCIUM 20 MG PO TABS: 20.0000 mg | ORAL_TABLET | Freq: Every day | ORAL | 3 refills | Status: DC

## 2022-01-27 NOTE — Progress Notes (Signed)
Name: April Mcmahon  MRN/ DOB: [id number], Jan 25, 1961   Age/ Sex: 61 y.o., female    PCP: Ladell Pier, MD   Reason for Endocrinology Evaluation: Type 2 Diabetes Mellitus     Date of Initial Endocrinology Visit: 10/17/2020    PATIENT IDENTIFIER: April Mcmahon is a 61 y.o. female with a past medical history of T2DM, HTN and Dyslipidemia . The patient presented for initial endocrinology clinic visit on 10/17/2020 for consultative assistance with her diabetes management.     DIABETIC HISTORY:  April Mcmahon  was diagnosed with DM yrs ago. Her  hemoglobin A1c has ranged from 6.3% in 2018, peaking at 10.3% in 2022.  On her initial visit to our clinic she had an A1c of 11.3%, she was on glipizide, Jardiance, and metformin as well as basal insulin.  We stopped glimepiride.  Continued metformin, increase Jardiance, and switch Levemir to Lantus   SUBJECTIVE:   During the last visit (07/24/2021): A1c 7.6 %      Today (01/27/22): April Mcmahon is here for a follow up on diabetes management.  She is accompanied by her daughter In-Law . She checks her  blood sugars 1 times daily. The patient has not had hypoglycemic episodes since the last clinic visit.   She met with our RD in June 2023 and 10/2021   Denies nausea,vomiting or diarrhea  Daughter in-law again states they are out of atorvastatin   HOME Endocrine  REGIMEN: Jardiance 25 mg daily  Lantus 14 units daily  Metformin 1000 mg BID Atorvastatin 20 mg daily    Statin: yes ACE-I/ARB: yes Prior Diabetic Education: no   METER DOWNLOAD SUMMARY: unable to download [phone]       DIABETIC COMPLICATIONS: Microvascular complications:  Neuropathy  Denies: CKD, retinopathy  Last eye exam: Completed 2022  Macrovascular complications:   Denies: CAD, PVD, CVA   PAST HISTORY: Past Medical History:  Past Medical History:  Diagnosis Date   Bilateral chronic knee pain 06/2014   Chronic low back pain 06/2014    Chronic migraine 02/1985   Depression    hx of   Diabetes mellitus without complication ([place]) 76/5465   Fall    Hyperlipidemia    Hypertension 09/2013   Hypothyroidism    Shoulder pain    Past Surgical History:  Past Surgical History:  Procedure Laterality Date   no previous surgeries      Social History:  reports that she has quit smoking. She has never used smokeless tobacco. She reports that she does not drink alcohol and does not use drugs. Family History:  Family History  Problem Relation Age of Onset   Migraines Mother    Esophageal cancer Other    Breast cancer Neg Hx    Colon cancer Neg Hx    Rectal cancer Neg Hx    Stomach cancer Neg Hx      HOME MEDICATIONS: Allergies as of 01/27/2022       Reactions   Elavil [amitriptyline Hcl] Diarrhea, Nausea And Vomiting        Medication List        Accurate as of January 27, 2022  1:25 PM. If you have any questions, ask your nurse or doctor.          Accu-Chek Guide Me w/Device Kit Check blood sugar three times daily. Dx E11.42   Accu-Chek Guide test strip Generic drug: glucose blood Use as instructed   Accu-Chek Softclix Lancets lancets Check blood sugar three times  daily. Dx E11.42   Accu-Chek Softclix Lancets lancets Use as instructed to Check blood sugar three times daily   amLODipine 10 MG tablet Commonly known as: NORVASC Take 1 tablet by mouth daily.   amLODipine 10 MG tablet Commonly known as: NORVASC Take 1 tablet (10 mg total) by mouth daily.   atorvastatin 20 MG tablet Commonly known as: LIPITOR Take 1 tablet (20 mg total) by mouth daily.   diclofenac Sodium 1 % Gel Commonly known as: Voltaren Apply 2 g topically 4 (four) times daily.   empagliflozin 25 MG Tabs tablet Commonly known as: Jardiance Take 1 tablet (25 mg total) by mouth daily before breakfast.   fluticasone 50 MCG/ACT nasal spray Commonly known as: FLONASE Place 2 sprays into both nostrils daily. What changed:   when to take this reasons to take this   gabapentin 600 MG tablet Commonly known as: Neurontin Take 0.5 tablets (300 mg total) by mouth 3 (three) times daily.   Insulin Pen Needle 32G X 4 MM Misc 1 Device by Does not apply route daily in the afternoon.   Lantus SoloStar 100 UNIT/ML Solostar Pen Generic drug: insulin glargine Inject 14 Units into the skin daily.   levothyroxine 50 MCG tablet Commonly known as: SYNTHROID TAKE 1 TABLET BY MOUTH EVERY DAY   loratadine 10 MG tablet Commonly known as: CLARITIN Take 1 tablet (10 mg total) by mouth daily. What changed:  when to take this reasons to take this   Meloxicam 15 MG Tbdp Take 15 mg by mouth daily as needed (joint pain).   metFORMIN 1000 MG tablet Commonly known as: GLUCOPHAGE Take 1 tablet (1,000 mg total) by mouth 2 (two) times daily with a meal.   valsartan 40 MG tablet Commonly known as: DIOVAN Take 20 mg by mouth daily.         ALLERGIES: Allergies  Allergen Reactions   Elavil [Amitriptyline Hcl] Diarrhea and Nausea And Vomiting        OBJECTIVE:   VITAL SIGNS: BP 122/70 (BP Location: Left Arm, Patient Position: Sitting, Cuff Size: Large)   Pulse 79   Ht 5' (1.524 m)   Wt 151 lb (68.5 kg)   SpO2 97%   BMI 29.49 kg/m    PHYSICAL EXAM:  General: Pt appears well and is in NAD  Lungs: Clear with good BS bilat   Heart: RRR   Extremities:  Lower extremities - No pretibial edema. No lesions.  Neuro: MS is good with appropriate affect, pt is alert and Ox3    DM Foot Exam 01/27/2022  The skin of the feet is intact without sores or ulcerations. The pedal pulses are 2+ on right and 2+ on left. The sensation is intact to a screening 5.07, 10 gram monofilament bilaterally     DATA REVIEWED:  Lab Results  Component Value Date   HGBA1C 7.1 (A) 01/27/2022   HGBA1C 7.6 (H) 07/08/2021   HGBA1C 6.9 04/21/2021   Lab Results  Component Value Date   MICROALBUR 3.8 01/20/2016   LDLCALC 109 (H)  09/26/2020   CREATININE 0.62 07/08/2021   Lab Results  Component Value Date   MICRALBCREAT 44 (H) 09/24/2021    Lab Results  Component Value Date   CHOL 210 (H) 09/26/2020   HDL 56 09/26/2020   LDLCALC 109 (H) 09/26/2020   TRIG 261 (H) 09/26/2020   CHOLHDL 3.8 09/26/2020        ASSESSMENT / PLAN / RECOMMENDATIONS:   1) Type 2 Diabetes Mellitus,  optimally controlled, With neuropathic  complications - Most recent A1c of 7.6  %. Goal A1c < 7.0 %.     - A1  at goal  - Pt with language barrier - No changes    MEDICATIONS:  -Continue Jardiance  25 mg daily  -Continue Metformin 1000 mg , twice a day  -Continue  Lantus 14 units daily   EDUCATION / INSTRUCTIONS: BG monitoring instructions: Patient is instructed to check her blood sugars 3 times a day, before meals . Call [facility] if: BG persistently < 70  I reviewed the Rule of 15 for the treatment of hypoglycemia in detail with the patient. Literature supplied.   2) Diabetic complications:  Eye: Does not have known diabetic retinopathy.  Neuro/ Feet: Does  have known diabetic peripheral neuropathy. Renal: Patient does not have known baseline CKD. She is  on an ACEI/ARB at present.     3) Dyslipidemia:  -We had increased her atorvastatin due to LDL above goal at 109 mg/DL in 09/2020 -Per daughter in-law the pt has been out, this is the second visit I am being told they don't have refilled. A yr supply was sent back in 07/2021 - We discussed cardiovascular benefits of statin therapy and the importance of compliance with statin therapy to reduce risk of CVA and MI    Medication   Continue atorvastatin 20 mg daily   F/U in 6 months     Signed electronically by: Mack Guise, MD  [facility]  [facility] [address] [place], [place] [address] Phone: [phone] FAX: [phone]   CC: Ladell Pier, MD [place]  [place] [address] Phone: [phone]  Fax: [phone]    Return to Endocrinology clinic as below: Future Appointments  Date Time Provider [place]  02/02/2022  1:30 PM Ladell Pier, MD [id number] None  02/18/2022  4:15 PM Valora Piccolo, Barnabas Lister, RD [place] [facility]

## 2022-01-27 NOTE — Patient Instructions (Signed)
-   Continue Jardiance 25 mg daily  - Continue Metformin 1000 mg , twice a day  - Continue Lantus 14 units once daily  - Continue Atorvastatin to 20 mg daily ( Cholesterol )     HOW TO TREAT LOW BLOOD SUGARS (Blood sugar LESS THAN 70 MG/DL) Please follow the RULE OF 15 for the treatment of hypoglycemia treatment (when your (blood sugars are less than 70 mg/dL)   STEP 1: Take 15 grams of carbohydrates when your blood sugar is low, which includes:  3-4 GLUCOSE TABS  OR 3-4 OZ OF JUICE OR REGULAR SODA OR ONE TUBE OF GLUCOSE GEL    STEP 2: RECHECK blood sugar in 15 MINUTES STEP 3: If your blood sugar is still low at the 15 minute recheck --> then, go back to STEP 1 and treat AGAIN with another 15 grams of carbohydrates.

## 2022-02-02 ENCOUNTER — Ambulatory Visit: Payer: Medicaid Other | Attending: Internal Medicine | Admitting: Internal Medicine

## 2022-02-02 ENCOUNTER — Encounter: Payer: Self-pay | Admitting: Internal Medicine

## 2022-02-02 VITALS — BP 150/68 | HR 75 | Temp 97.6°F | Ht 60.0 in | Wt 154.0 lb

## 2022-02-02 DIAGNOSIS — Z7984 Long term (current) use of oral hypoglycemic drugs: Secondary | ICD-10-CM | POA: Diagnosis not present

## 2022-02-02 DIAGNOSIS — M25512 Pain in left shoulder: Secondary | ICD-10-CM | POA: Diagnosis not present

## 2022-02-02 DIAGNOSIS — M170 Bilateral primary osteoarthritis of knee: Secondary | ICD-10-CM | POA: Insufficient documentation

## 2022-02-02 DIAGNOSIS — E1142 Type 2 diabetes mellitus with diabetic polyneuropathy: Secondary | ICD-10-CM | POA: Diagnosis not present

## 2022-02-02 DIAGNOSIS — M79602 Pain in left arm: Secondary | ICD-10-CM | POA: Insufficient documentation

## 2022-02-02 DIAGNOSIS — G43909 Migraine, unspecified, not intractable, without status migrainosus: Secondary | ICD-10-CM | POA: Diagnosis not present

## 2022-02-02 DIAGNOSIS — Z794 Long term (current) use of insulin: Secondary | ICD-10-CM | POA: Diagnosis not present

## 2022-02-02 DIAGNOSIS — E785 Hyperlipidemia, unspecified: Secondary | ICD-10-CM | POA: Insufficient documentation

## 2022-02-02 DIAGNOSIS — E039 Hypothyroidism, unspecified: Secondary | ICD-10-CM | POA: Insufficient documentation

## 2022-02-02 DIAGNOSIS — E1159 Type 2 diabetes mellitus with other circulatory complications: Secondary | ICD-10-CM | POA: Diagnosis not present

## 2022-02-02 DIAGNOSIS — G43009 Migraine without aura, not intractable, without status migrainosus: Secondary | ICD-10-CM

## 2022-02-02 DIAGNOSIS — E559 Vitamin D deficiency, unspecified: Secondary | ICD-10-CM | POA: Insufficient documentation

## 2022-02-02 DIAGNOSIS — I152 Hypertension secondary to endocrine disorders: Secondary | ICD-10-CM | POA: Insufficient documentation

## 2022-02-02 DIAGNOSIS — Z79899 Other long term (current) drug therapy: Secondary | ICD-10-CM | POA: Diagnosis not present

## 2022-02-02 MED ORDER — SUMATRIPTAN SUCCINATE 50 MG PO TABS: 50.0000 mg | ORAL_TABLET | ORAL | 2 refills | Status: DC | PRN

## 2022-02-02 MED ORDER — MELOXICAM 15 MG PO TBDP: 15.0000 mg | ORAL_TABLET | Freq: Every day | ORAL | 2 refills | Status: AC

## 2022-02-02 MED ORDER — TRAMADOL HCL 50 MG PO TABS: 50.0000 mg | ORAL_TABLET | Freq: Three times a day (TID) | ORAL | 0 refills | Status: AC | PRN

## 2022-02-02 MED ORDER — LEVOTHYROXINE SODIUM 50 MCG PO TABS: 50.0000 ug | ORAL_TABLET | Freq: Every day | ORAL | 4 refills | Status: DC

## 2022-02-02 MED ORDER — AMLODIPINE BESYLATE 10 MG PO TABS: 10.0000 mg | ORAL_TABLET | Freq: Every day | ORAL | 11 refills | Status: DC

## 2022-02-02 NOTE — Patient Instructions (Signed)
Stop ibuprofen. I have sent refill on the medication called meloxicam 15 mg for you to take once a day. I have also given a short course of pain medication called tramadol 50 mg for you to take 3 times a day as needed.  The medication can cause drowsiness.  We will use this for short period.  I have referred you to the orthopedics for your shoulder.  You are missing one of your blood pressure medication called amlodipine.  I have sent a refill on that to your pharmacy.

## 2022-02-02 NOTE — Progress Notes (Signed)
Patient ID: April Mcmahon, female    DOB: 03/24/1960  MRN: [id number]  CC: Diabetes (DM f/u. /L arm pain X1 mo, headache.)   Subjective: April Mcmahon is a 62 y.o. female who presents for chronic disease management.  Daughter-in-law, Enis Gash. Is with her Her concerns today include:  Pt with hx of DM neuropathy, HTN, HL, former tob dep, migraines, Vit D def, DJD knees and plantar fasciitis, Abn LFT with steatosis on U/S, BL CTS, hypothyroid, spinal stenosis cervical spine   Pt c/o pain in LT shoulder that radiates bicep x 21 days No initiating factors.  No associated numbness or tingling' no neck pain.  However some weakness when she tries to hold something.  N Pain is constant and hurts when she lays down at nights.  Has to sleep with arm prop on pillow.  Taking 800 mg Ibuprofen Q 8hrs which helps  C/o HA that started around 9 a.m this morning. Similar to previous migraine HA.  HA started in RT temple area then moved to crown and is sharp +photophobia.  No blurred vision, dizziness, N/V, scalp pain Out of Imitrex x 2 mths  HTN:  On last visit she had Norvasc with he rbut not Valsartan.  Today she has bottle of Valsartan 40 mg but does not have the Norvasc 10 mg with her.  Not sure if she left it at home Checks BP daily.  This a.m BP was 133 SBP.  Not sure of DBP ? 80.  Does not have log with her Has bottle of Atorvastatin 20 mg with her.  Taking daily, no muscle cramps.   Saw Dr. Leonette Monarch less than 1 wk ago. A1C 7.1.  Compliant with Lantus 14 units, Metformin 1 gram BID and Jardiance.  Does well with eating habits Referral sent to Dr. Katy Fitch on last visit for eye exam.  No appt as yet  Has bottle of Cymbalta 60 mg with her.  However she reports that she discontinued taking it because it was causing nausea and dizziness.  Patient Active Problem List   Diagnosis Date Noted   Contusion of right foot 10/30/2021   Viral URI with cough 10/30/2021   History of diabetes mellitus, type  II 10/30/2021   History of hypertension 10/30/2021   Hyperglycemia 10/30/2021   Vitamin B12 deficiency 09/24/2021   DM type 2 with diabetic peripheral neuropathy ([place]) 06/15/2021   Dizziness 06/15/2021   Dyslipidemia 10/17/2020   Uncontrolled type 2 diabetes mellitus with peripheral neuropathy 10/17/2020   Hyperlipidemia associated with type 2 diabetes mellitus ([place]) 05/05/2020   Influenza vaccine needed 01/03/2020   Former smoker 01/03/2020   Acquired hypothyroidism 01/03/2020   Microalbuminuria 11/15/2018   Closed fracture of distal phalanx of great toe 08/23/2018   Muscle cramps 02/21/2018   Hepatic steatosis 12/23/2017   MCI (mild cognitive impairment) 02/14/2017   Diabetic peripheral neuropathy ([place]) 02/14/2017   Memory loss 12/27/2016   Dysfunctional uterine bleeding 08/30/2016   Analgesic rebound headache 06/24/2016   Esophageal dysphagia 06/21/2016   Tobacco dependence 01/20/2016   Plantar fasciitis, bilateral 12/24/2015   DJD (degenerative joint disease) of knee 06/20/2015   Type 2 diabetes mellitus with peripheral neuropathy ([place]) 06/19/2015   Essential hypertension 06/19/2015   Vitamin D deficiency 09/24/2013   Migraine 05/15/2013   Low back pain 08/18/2012     Current Outpatient Medications on File Prior to Visit  Medication Sig Dispense Refill   Accu-Chek Softclix Lancets lancets Check blood sugar three times daily. Dx E08.14  100 each 12   Accu-Chek Softclix Lancets lancets Use as instructed to Check blood sugar three times daily 100 each 12   atorvastatin (LIPITOR) 20 MG tablet Take 1 tablet (20 mg total) by mouth daily. 90 tablet 3   Blood Glucose Monitoring Suppl (ACCU-CHEK GUIDE ME) w/Device KIT Check blood sugar three times daily. Dx E11.42 1 kit 0   DULoxetine (CYMBALTA) 60 MG capsule Take 60 mg by mouth daily. Take 1 capsule by mouth every day.     empagliflozin (JARDIANCE) 25 MG TABS tablet Take 1 tablet (25 mg total) by mouth daily before breakfast. 90  tablet 3   fluticasone (FLONASE) 50 MCG/ACT nasal spray Place 2 sprays into both nostrils daily. (Patient taking differently: Place 2 sprays into both nostrils daily as needed for allergies.) 16 g 0   glucose blood (ACCU-CHEK GUIDE) test strip Use as instructed 100 each 12   insulin glargine (LANTUS SOLOSTAR) 100 UNIT/ML Solostar Pen Inject 14 Units into the skin daily. 15 mL 3   Insulin Pen Needle 32G X 4 MM MISC 1 Device by Does not apply route daily in the afternoon. 100 each 3   loratadine (CLARITIN) 10 MG tablet Take 1 tablet (10 mg total) by mouth daily. (Patient taking differently: Take 10 mg by mouth at bedtime as needed for allergies.) 30 tablet 3   meclizine (ANTIVERT) 25 MG tablet Take 25 mg by mouth 3 (three) times daily as needed for dizziness. Take 1 tablet by mouth 3 times daily as needed for dizziness.     metFORMIN (GLUCOPHAGE) 1000 MG tablet Take 1 tablet (1,000 mg total) by mouth 2 (two) times daily with a meal. 180 tablet 3   valsartan (DIOVAN) 40 MG tablet Take 20 mg by mouth daily.     diclofenac Sodium (VOLTAREN) 1 % GEL Apply 2 g topically 4 (four) times daily. (Patient not taking: Reported on 02/02/2022) 100 g 3   gabapentin (NEURONTIN) 600 MG tablet Take 0.5 tablets (300 mg total) by mouth 3 (three) times daily. 45 tablet 1   [DISCONTINUED] venlafaxine (EFFEXOR) 37.5 MG tablet Take 1 tablet (37.5 mg total) by mouth 2 (two) times daily with a meal. 60 tablet 11   No current facility-administered medications on file prior to visit.    Allergies  Allergen Reactions   Elavil [Amitriptyline Hcl] Diarrhea and Nausea And Vomiting    Social History   Socioeconomic History   Marital status: Married    Spouse name: Not on file   Number of children: Not on file   Years of education: Not on file   Highest education level: Not on file  Occupational History   Not on file  Tobacco Use   Smoking status: Former   Smokeless tobacco: Never  Vaping Use   Vaping Use: Never used   Substance and Sexual Activity   Alcohol use: No   Drug use: No   Sexual activity: Not on file  Other Topics Concern   Not on file  Social History Narrative   Not on file   Social Determinants of Health   Financial Resource Strain: Not on file  Food Insecurity: Not on file  Transportation Needs: Not on file  Physical Activity: Not on file  Stress: Not on file  Social Connections: Not on file  Intimate Partner Violence: Not on file    Family History  Problem Relation Age of Onset   Migraines Mother    Esophageal cancer Other    Breast cancer Neg  Hx    Colon cancer Neg Hx    Rectal cancer Neg Hx    Stomach cancer Neg Hx     Past Surgical History:  Procedure Laterality Date   no previous surgeries      ROS: Review of Systems Negative except as stated above  PHYSICAL EXAM: BP (!) 150/68   Pulse 75   Temp 97.6 F (36.4 C) (Oral)   Ht 5' (1.524 m)   Wt 154 lb (69.9 kg)   SpO2 98%   BMI 30.08 kg/m   Wt Readings from Last 3 Encounters:  02/02/22 154 lb (69.9 kg)  01/27/22 151 lb (68.5 kg)  10/30/21 150 lb (68 kg)    Physical Exam  General appearance - alert, well appearing, and in no distress Neck - supple, no significant adenopathy Chest - clear to auscultation, no wheezes, rales or rhonchi, symmetric air entry Heart - normal rate, regular rhythm, normal S1, S2, no murmurs, rubs, clicks or gallops Neurological - cranial nerves II through XII intact, motor and sensory grossly normal bilaterally Musculoskeletal - LT shoulder: Mild to moderate tenderness on palpation of the anterior joint line.  Moderate discomfort with attempted passive range of motion in all directions.  Drop arm test positive. Extremities - peripheral pulses normal, no pedal edema, no clubbing or cyanosis      Latest Ref Rng & Units 07/08/2021    2:13 PM 12/22/2020   10:14 AM 05/09/2020   10:06 AM  CMP  Glucose 70 - 99 mg/dL 169  90  302   BUN 6 - 20 mg/dL _0 Creatinine 0.44 -  1.00 mg/dL 0.62  0.66  0.82   Sodium 135 - 145 mmol/L 140  144  138   Potassium 3.5 - 5.1 mmol/L 3.9  4.5  4.7   Chloride 98 - 111 mmol/L 111  106  101   CO2 22 - 32 mmol/L _1 Calcium 8.9 - 10.3 mg/dL 9.3  9.6  9.5   Total Protein 6.0 - 8.5 g/dL  7.7  7.5   Total Bilirubin 0.0 - 1.2 mg/dL  0.5  0.4   Alkaline Phos 44 - 121 IU/L  126  145   AST 0 - 40 IU/L  27  30   ALT 0 - 32 IU/L  32  35    Lipid Panel     Component Value Date/Time   CHOL 210 (H) 09/26/2020 1104   TRIG 261 (H) 09/26/2020 1104   HDL 56 09/26/2020 1104   CHOLHDL 3.8 09/26/2020 1104   CHOLHDL 3.3 10/20/2015 1625   VLDL 46 (H) 10/20/2015 1625   LDLCALC 109 (H) 09/26/2020 1104    CBC    Component Value Date/Time   WBC 8.1 06/15/2021 1615   WBC 9.7 10/10/2017 1941   RBC 4.72 06/15/2021 1615   RBC 4.81 10/10/2017 1941   HGB 12.7 06/15/2021 1615   HCT 37.1 06/15/2021 1615   PLT 259 01/03/2020 1616   MCV 79 06/15/2021 1615   MCH 26.9 06/15/2021 1615   MCH 26.4 10/10/2017 1941   MCHC 34.2 06/15/2021 1615   MCHC 31.0 10/10/2017 1941   RDW 12.7 06/15/2021 1615   LYMPHSABS 3.2 (H) 06/15/2021 1615   MONOABS 335 10/20/2015 1625   EOSABS 0.2 06/15/2021 1615   BASOSABS 0.0 06/15/2021 1615    ASSESSMENT AND PLAN: 1. Hypertension associated with type 2 diabetes mellitus ([place]) Not at goal. Continue Valsartan.  RF Norvasc, advise pt to pick up from pharmacy.  Advised to stop Ibuprofen as may be contributing to elevated blood pressure as well. - amLODipine (NORVASC) 10 MG tablet; Take 1 tablet (10 mg total) by mouth daily.  Dispense: 30 tablet; Refill: 11  2. Acute pain of left shoulder Likely rotator cuff pathology.  Pt denies any initiating factors. Stop Ibuprofen.  Restart Mobic Patient given a limited supply of tramadol to use as needed.  Advised that the medication can cause drowsiness and can become habit-forming.  [place] controlled substance reporting system reviewed. - Meloxicam 15 MG  TBDP; Take 15 mg by mouth daily.  Dispense: 30 tablet; Refill: 2 - traMADol (ULTRAM) 50 MG tablet; Take 1 tablet (50 mg total) by mouth every 8 (eight) hours as needed for up to 5 days.  Dispense: 15 tablet; Refill: 0 - Ambulatory referral to Orthopedic Surgery  3. Migraine without aura and without status migrainosus, not intractable RF Imitrex and went over how to use it - SUMAtriptan (IMITREX) 50 MG tablet; Take 1 tablet (50 mg total) by mouth every 2 (two) hours as needed for migraine. Take 1 tab at start of headache; may repeat in 2 hrs if headache still present.  Max 2 tabs/24 hr.  Dispense: 10 tablet; Refill: 2  4. Type 2 diabetes mellitus with peripheral neuropathy (HCC) Last A1c was close to goal.  She will continue her current medications that include Jardiance 25 mg daily, metformin 1 g twice a day and Lantus 14 units daily. - Lipid panel - Comprehensive metabolic panel - CBC - Ambulatory referral to Ophthalmology  5. Acquired hypothyroidism - TSH - levothyroxine (SYNTHROID) 50 MCG tablet; Take 1 tablet (50 mcg total) by mouth daily.  Dispense: 30 tablet; Refill: 4    [employer] Language interpreter used during this encounter. #[id number], Gabriel Rainwater  Patient was given the opportunity to ask questions.  Patient verbalized understanding of the plan and was able to repeat key elements of the plan.   This documentation was completed using Radio producer.  Any transcriptional errors are unintentional.  Orders Placed This Encounter  Procedures   Lipid panel   Comprehensive metabolic panel   TSH   CBC   Ambulatory referral to Orthopedic Surgery   Ambulatory referral to Ophthalmology     Requested Prescriptions   Signed Prescriptions Disp Refills   amLODipine (NORVASC) 10 MG tablet 30 tablet 11    Sig: Take 1 tablet (10 mg total) by mouth daily.   levothyroxine (SYNTHROID) 50 MCG tablet 30 tablet 4    Sig: Take 1 tablet (50 mcg total) by mouth daily.   Meloxicam  15 MG TBDP 30 tablet 2    Sig: Take 15 mg by mouth daily.   traMADol (ULTRAM) 50 MG tablet 15 tablet 0    Sig: Take 1 tablet (50 mg total) by mouth every 8 (eight) hours as needed for up to 5 days.   SUMAtriptan (IMITREX) 50 MG tablet 10 tablet 2    Sig: Take 1 tablet (50 mg total) by mouth every 2 (two) hours as needed for migraine. Take 1 tab at start of headache; may repeat in 2 hrs if headache still present.  Max 2 tabs/24 hr.    Return in about 1 month (around 03/05/2022) for PAP.  Karle Plumber, MD, FACP

## 2022-02-03 ENCOUNTER — Other Ambulatory Visit: Payer: Self-pay | Admitting: Internal Medicine

## 2022-02-03 DIAGNOSIS — E039 Hypothyroidism, unspecified: Secondary | ICD-10-CM

## 2022-02-03 LAB — COMPREHENSIVE METABOLIC PANEL
ALT: 18 IU/L (ref 0–32)
AST: 18 IU/L (ref 0–40)
Albumin/Globulin Ratio: 1.9 (ref 1.2–2.2)
Albumin: 4.9 g/dL (ref 3.8–4.9)
Alkaline Phosphatase: 119 IU/L (ref 44–121)
BUN/Creatinine Ratio: 26 (ref 12–28)
BUN: 17 mg/dL (ref 8–27)
Bilirubin Total: 0.3 mg/dL (ref 0.0–1.2)
CO2: 22 mmol/L (ref 20–29)
Calcium: 9.4 mg/dL (ref 8.7–10.3)
Chloride: 108 mmol/L — ABNORMAL HIGH (ref 96–106)
Creatinine, Ser: 0.66 mg/dL (ref 0.57–1.00)
Globulin, Total: 2.6 g/dL (ref 1.5–4.5)
Glucose: 127 mg/dL — ABNORMAL HIGH (ref 70–99)
Potassium: 4.2 mmol/L (ref 3.5–5.2)
Sodium: 145 mmol/L — ABNORMAL HIGH (ref 134–144)
Total Protein: 7.5 g/dL (ref 6.0–8.5)
eGFR: 100 mL/min/{1.73_m2} (ref >59)

## 2022-02-03 LAB — LIPID PANEL
Chol/HDL Ratio: 2.6 ratio (ref 0.0–4.4)
Cholesterol, Total: 144 mg/dL (ref 100–199)
HDL: 55 mg/dL (ref >39)
LDL Chol Calc (NIH): 49 mg/dL (ref 0–99)
Triglycerides: 262 mg/dL — ABNORMAL HIGH (ref 0–149)
VLDL Cholesterol Cal: 40 mg/dL (ref 5–40)

## 2022-02-03 LAB — CBC
Hematocrit: 38.6 % (ref 34.0–46.6)
Hemoglobin: 12.7 g/dL (ref 11.1–15.9)
MCH: 26.5 pg — ABNORMAL LOW (ref 26.6–33.0)
MCHC: 32.9 g/dL (ref 31.5–35.7)
MCV: 80 fL (ref 79–97)
Platelets: 259 10*3/uL (ref 150–450)
RBC: 4.8 x10E6/uL (ref 3.77–5.28)
RDW: 14.8 % (ref 11.7–15.4)
WBC: 7.8 10*3/uL (ref 3.4–10.8)

## 2022-02-03 LAB — TSH: TSH: 23.1 u[IU]/mL — ABNORMAL HIGH (ref 0.450–4.500)

## 2022-02-03 NOTE — Progress Notes (Signed)
Let patient and her daughter-in-law know that her thyroid level is abnormal.  Please inquire whether or not she has been taking the levothyroxine consistently every day over the past few months. Was there ever a time in the past 1 month that she was out of the Levothyroxine for a week or more?  If she has been taking it consistently and had not been out of the medicine for a week or more within the past 1 month, then we need to increase the dose of the levothyroxine from 50 mcg daily to 75 mcg daily.  Please let me know so that I can send an updated prescription to her pharmacy. Cholesterol levels are good except for mild elevation in triglyceride level.  She can take fish oil supplement 1000 mg daily OTC to help with this. Kidney and liver function tests are good.

## 2022-02-06 ENCOUNTER — Ambulatory Visit (HOSPITAL_COMMUNITY): Admit: 2022-02-06 | Discharge status: Against Medical Advice | Payer: Medicaid Other

## 2022-02-16 ENCOUNTER — Other Ambulatory Visit: Payer: Self-pay

## 2022-02-16 ENCOUNTER — Encounter: Payer: Self-pay | Admitting: Orthopaedic Surgery

## 2022-02-16 ENCOUNTER — Ambulatory Visit (INDEPENDENT_AMBULATORY_CARE_PROVIDER_SITE_OTHER): Payer: Medicaid Other | Admitting: Orthopaedic Surgery

## 2022-02-16 DIAGNOSIS — M25512 Pain in left shoulder: Secondary | ICD-10-CM | POA: Diagnosis not present

## 2022-02-16 DIAGNOSIS — G8929 Other chronic pain: Secondary | ICD-10-CM | POA: Diagnosis not present

## 2022-02-16 LAB — HM DIABETES EYE EXAM

## 2022-02-16 NOTE — Progress Notes (Signed)
Office Visit Note   Patient: April Mcmahon           Date of Birth: 1960-03-15           MRN: [id number] Visit Date: 02/16/2022              Requested by: Ladell Pier, MD [address],  [place] [id number] PCP: Ladell Pier, MD   Assessment & Plan: Visit Diagnoses:  1. Chronic left shoulder pain     Plan: Impression is chronic left shoulder pain.  Findings suggest adhesive capsulitis or osteoarthritis flare.  Rotator cuff feels strong.  I will send her to Dr. Rolena Infante for intra-articular steroid injection and we will see how this works.  I have also made a referral to outpatient PT.  Interpreter present today.  Follow-Up Instructions: No follow-ups on file.   Orders:  Orders Placed This Encounter  Procedures   Ambulatory referral to Occupational Therapy   No orders of the defined types were placed in this encounter.     Procedures: No procedures performed   Clinical Data: No additional findings.   Subjective: Chief Complaint  Patient presents with   Left Shoulder - Pain    HPI Patient is a 62 year old female who has had left shoulder pain for about a month and a half without any injuries.  Has decreased range of motion due to pain.  Left-hand-dominant.  Pain radiates across the anterior joint line. Review of Systems  Constitutional: Negative.   HENT: Negative.    Eyes: Negative.   Respiratory: Negative.    Cardiovascular: Negative.   Endocrine: Negative.   Musculoskeletal: Negative.   Neurological: Negative.   Hematological: Negative.   Psychiatric/Behavioral: Negative.    All other systems reviewed and are negative.    Objective: Vital Signs: There were no vitals taken for this visit.  Physical Exam Vitals and nursing note reviewed.  Constitutional:      Appearance: She is well-developed.  HENT:     Head: Atraumatic.     Nose: Nose normal.  Eyes:     Extraocular Movements: Extraocular movements intact.   Cardiovascular:     Pulses: Normal pulses.  Pulmonary:     Effort: Pulmonary effort is normal.  Abdominal:     Palpations: Abdomen is soft.  Musculoskeletal:     Cervical back: Neck supple.  Skin:    General: Skin is warm.     Capillary Refill: Capillary refill takes less than 2 seconds.  Neurological:     Mental Status: She is alert. Mental status is at baseline.  Psychiatric:        Behavior: Behavior normal.        Thought Content: Thought content normal.        Judgment: Judgment normal.     Ortho Exam Examination left shoulder shows normal range of motion with mild to moderate discomfort.  Manual muscle testing of rotator cuff is normal. Specialty Comments:  No specialty comments available.  Imaging: No results found.   PMFS History: Patient Active Problem List   Diagnosis Date Noted   Contusion of right foot 10/30/2021   Viral URI with cough 10/30/2021   History of diabetes mellitus, type II 10/30/2021   History of hypertension 10/30/2021   Hyperglycemia 10/30/2021   Vitamin B12 deficiency 09/24/2021   DM type 2 with diabetic peripheral neuropathy ([place]) 06/15/2021   Dizziness 06/15/2021   Dyslipidemia 10/17/2020   Uncontrolled type 2 diabetes mellitus with peripheral  neuropathy 10/17/2020   Hyperlipidemia associated with type 2 diabetes mellitus ([place]) 05/05/2020   Influenza vaccine needed 01/03/2020   Former smoker 01/03/2020   Acquired hypothyroidism 01/03/2020   Microalbuminuria 11/15/2018   Closed fracture of distal phalanx of great toe 08/23/2018   Muscle cramps 02/21/2018   Hepatic steatosis 12/23/2017   MCI (mild cognitive impairment) 02/14/2017   Diabetic peripheral neuropathy ([place]) 02/14/2017   Memory loss 12/27/2016   Dysfunctional uterine bleeding 08/30/2016   Analgesic rebound headache 06/24/2016   Esophageal dysphagia 06/21/2016   Tobacco dependence 01/20/2016   Plantar fasciitis, bilateral 12/24/2015   DJD (degenerative joint disease) of  knee 06/20/2015   Type 2 diabetes mellitus with peripheral neuropathy ([place]) 06/19/2015   Essential hypertension 06/19/2015   Vitamin D deficiency 09/24/2013   Migraine 05/15/2013   Low back pain 08/18/2012   Past Medical History:  Diagnosis Date   Bilateral chronic knee pain 06/2014   Chronic low back pain 06/2014   Chronic migraine 02/1985   Depression    hx of   Diabetes mellitus without complication ([place] [place]) 91/5056   Fall    Hyperlipidemia    Hypertension 09/2013   Hypothyroidism    Shoulder pain     Family History  Problem Relation Age of Onset   Migraines Mother    Esophageal cancer Other    Breast cancer Neg Hx    Colon cancer Neg Hx    Rectal cancer Neg Hx    Stomach cancer Neg Hx     Past Surgical History:  Procedure Laterality Date   no previous surgeries     Social History   Occupational History   Not on file  Tobacco Use   Smoking status: Former   Smokeless tobacco: Never  Vaping Use   Vaping Use: Never used  Substance and Sexual Activity   Alcohol use: No   Drug use: No   Sexual activity: Not on file

## 2022-02-18 ENCOUNTER — Encounter: Payer: Self-pay | Admitting: Dietician

## 2022-02-18 ENCOUNTER — Ambulatory Visit: Payer: Self-pay

## 2022-02-18 ENCOUNTER — Encounter: Payer: Self-pay | Admitting: Sports Medicine

## 2022-02-18 ENCOUNTER — Ambulatory Visit (INDEPENDENT_AMBULATORY_CARE_PROVIDER_SITE_OTHER): Payer: Medicaid Other | Admitting: Sports Medicine

## 2022-02-18 ENCOUNTER — Encounter: Payer: Medicaid Other | Attending: Internal Medicine | Admitting: Dietician

## 2022-02-18 VITALS — Wt 152.0 lb

## 2022-02-18 DIAGNOSIS — E1142 Type 2 diabetes mellitus with diabetic polyneuropathy: Secondary | ICD-10-CM | POA: Diagnosis present

## 2022-02-18 DIAGNOSIS — G8929 Other chronic pain: Secondary | ICD-10-CM | POA: Diagnosis not present

## 2022-02-18 DIAGNOSIS — M25512 Pain in left shoulder: Secondary | ICD-10-CM

## 2022-02-18 MED ORDER — METHYLPREDNISOLONE ACETATE 40 MG/ML IJ SUSP
40.0000 mg | INTRAMUSCULAR | Status: AC | PRN
  Administered 2022-02-18: 40 mg via INTRA_ARTICULAR

## 2022-02-18 MED ORDER — LIDOCAINE HCL 1 % IJ SOLN
2.0000 mL | INTRAMUSCULAR | Status: AC | PRN
  Administered 2022-02-18: 2 mL

## 2022-02-18 MED ORDER — BUPIVACAINE HCL 0.25 % IJ SOLN
2.0000 mL | INTRAMUSCULAR | Status: AC | PRN
  Administered 2022-02-18: 2 mL via INTRA_ARTICULAR

## 2022-02-18 NOTE — Patient Instructions (Signed)
Blood Sugar Goals: 80-130 before breakfast Less than 180 2 hours after a meal   Continue to walk.  Walking after most meals will lower your blood sugar.   Continue to eat plenty of vegetables. Continue to choose whole grains.  Watch portion size. Choose lentils or beans with each lunch and dinner.   Continue to drink plenty of water and avoid sweetened beverages. Limit added salt. Use only small amounts of oil in cooking.

## 2022-02-18 NOTE — Progress Notes (Signed)
    Procedure Note  Patient: April Mcmahon             Date of Birth: 13-May-1960           MRN: [id number]             Visit Date: 02/18/2022  The use of an in-person [place], [place], interpreter was used throughout the entirety of this visit.  Procedures: Visit Diagnoses:  1. Chronic left shoulder pain    Large Joint Inj: L glenohumeral on 02/18/2022 4:00 PM Indications: pain Details: 22 G 3.5 in needle, ultrasound-guided posterior approach Medications: 2 mL lidocaine 1 %; 2 mL bupivacaine 0.25 %; 40 mg methylPREDNISolone acetate 40 MG/ML Outcome: tolerated well, no immediate complications  US-guided glenohumeral joint injection, left shoulder After discussion on risks/benefits/indications, informed verbal consent was obtained. A timeout was then performed. The patient was positioned lying lateral recumbent on examination table. The patient's shoulder was prepped with betadine and multiple alcohol swabs and utilizing ultrasound guidance, the patient's glenohumeral joint was identified on ultrasound. Using ultrasound guidance a 22-gauge, 3.5 inch needle with a mixture of 2:2:1 cc's lidocaine:bupivicaine:depomedrol was directed from a lateral to medial direction via in-plane technique into the glenohumeral joint with visualization of appropriate spread of injectate into the joint. Patient tolerated the procedure well without immediate complications.      Procedure, treatment alternatives, risks and benefits explained, specific risks discussed. Consent was given by the patient. Immediately prior to procedure a time out was called to verify the correct patient, procedure, equipment, support staff and site/side marked as required. Patient was prepped and draped in the usual sterile fashion.     - I evaluated the patient about 5 minutes post-injection and she had good improvement in pain and range of motion - follow-up with Dr. Erlinda Hong as indicated; I am happy to see them as needed  Elba Barman,  DO [place]  This note was dictated using Dragon naturally speaking software and may contain errors in syntax, spelling, or content which have not been identified prior to signing this note.

## 2022-02-18 NOTE — Progress Notes (Signed)
Diabetes Self-Management Education  Visit Type: Follow-up  Appt. Start Time: 1191 (late arrival) Appt. End Time: 4782  02/19/2022  April Mcmahon, identified by name and date of birth, is a 62 y.o. female with a diagnosis of Diabetes:  .   ASSESSMENT Patient is here today with her daughter-in-law and an interpretor, Tera from [employer]  She was last seen by this RD 10/15/2021.  She received a steroid injection in her shoulder today. Trying to stay active but is not walking as much in this weather. Continues to check her blood glucose twice daily.  110 fasting this am and 169 after dinner last night.  History includes Type 2 Diabetes (>5 years), HTN, HLD, vitamin D deficiency Medications include:  Jardiance, Metformin, Lantus 14 units q HS, synthroid Labs noted:  A1C is 7.1% 01/27/2022 decreased from 7.6% 07/2021 and 11.3% 09/26/2020.   eGFR 100, cholesterol 144, HDL 55, LDL 49, Triglycerides 262 on 02/02/22   Patient lives with husband, 3 children and 2 grandchildren. She is from [place] and has been in the [name] since 2011. She is unable to read.  Walks daily for 10-15 minutes bid. She does not eat chicken or other meats or eggs and does eat dairy, occasional fish. Uses small amounts of salt.  Avoids sweets.  Chooses whole grains and states that she tries to be mindful of portions. Continues to use small amounts of added salt on fresh vegetables  Weight 152 lb (68.9 kg). Body mass index is 29.69 kg/m.   Diabetes Self-Management Education - 02/19/22 0957       Visit Information   Visit Type Follow-up      Psychosocial Assessment   Self-care barriers English as a second language    Self-management support Doctor's office    Other persons present Patient;Family Member;Interpreter    Patient Concerns Nutrition/Meal planning;Healthy Lifestyle;Glycemic Control    Special Needs None    Learning Readiness Ready    How often do you need to have someone help you when  you read instructions, pamphlets, or other written materials from your doctor or pharmacy? 3 - Sometimes      Pre-Education Assessment   Patient understands the diabetes disease and treatment process. Comprehends key points    Patient understands incorporating nutritional management into lifestyle. Comprehends key points    Patient undertands incorporating physical activity into lifestyle. Comprehends key points    Patient understands using medications safely. Comprehends key points    Patient understands monitoring blood glucose, interpreting and using results Comprehends key points    Patient understands prevention, detection, and treatment of acute complications. Comprehends key points    Patient understands prevention, detection, and treatment of chronic complications. Compreheands key points    Patient understands how to develop strategies to address psychosocial issues. Comprehends key points    Patient understands how to develop strategies to promote health/change behavior. Comprehends key points      Complications   Last HgB A1C per patient/outside source 7.1 %   01/27/22   How often do you check your blood sugar? 1-2 times/day    Fasting Blood glucose range (mg/dL) 70-129    Postprandial Blood glucose range (mg/dL) 130-179    Number of hypoglycemic episodes per month 0      Dietary Intake   Breakfast homemade plain yogurt, nuts    Lunch brown rice, lentil soup, vegetable soup    Snack (afternoon) fruit    Dinner broccoli, quinoa, tofu    Snack (evening)  none    Beverage(s) water, unsweetened milk tea      Activity / Exercise   Activity / Exercise Type Light (walking / raking leaves)      Patient Education   Previous Diabetes Education Yes (please comment)   10/2021   Healthy Eating Meal options for control of blood glucose level and chronic complications.    Being Active Role of exercise on diabetes management, blood pressure control and cardiac health.    Medications  Reviewed patients medication for diabetes, action, purpose, timing of dose and side effects.    Diabetes Stress and Support Identified and addressed patients feelings and concerns about diabetes      Individualized Goals (developed by patient)   Nutrition General guidelines for healthy choices and portions discussed    Physical Activity Exercise 5-7 days per week;30 minutes per day    Medications take my medication as prescribed    Monitoring  Test my blood glucose as discussed    Reducing Risk examine blood glucose patterns;do foot checks daily      Patient Self-Evaluation of Goals - Patient rates self as meeting previously set goals (% of time)   Nutrition >75% (most of the time)    Physical Activity >75% (most of the time)    Medications >75% (most of the time)    Monitoring >75% (most of the time)    Problem Solving and behavior change strategies  50 - 75 % (half of the time)    Reducing Risk (treating acute and chronic complications) >84% (most of the time)    Health Coping >75% (most of the time)      Post-Education Assessment   Patient understands the diabetes disease and treatment process. Comprehends key points    Patient understands incorporating nutritional management into lifestyle. Comprehends key points    Patient undertands incorporating physical activity into lifestyle. Comprehends key points    Patient understands using medications safely. Comphrehends key points    Patient understands monitoring blood glucose, interpreting and using results Comprehends key points    Patient understands prevention, detection, and treatment of acute complications. Comprehends key points    Patient understands prevention, detection, and treatment of chronic complications. Comprehends key points    Patient understands how to develop strategies to address psychosocial issues. Comprehends key points    Patient understands how to develop strategies to promote health/change behavior. Comprehends  key points      Outcomes   Expected Outcomes Demonstrated interest in learning. Expect positive outcomes    Future DMSE 6 months    Program Status Not Completed      Subsequent Visit   Since your last visit have you experienced any weight changes? No change             Individualized Plan for Diabetes Self-Management Training:   Learning Objective:  Patient will have a greater understanding of diabetes self-management. Patient education plan is to attend individual and/or group sessions per assessed needs and concerns.   Plan:   Patient Instructions  Blood Sugar Goals: 80-130 before breakfast Less than 180 2 hours after a meal   Continue to walk.  Walking after most meals will lower your blood sugar.   Continue to eat plenty of vegetables. Continue to choose whole grains.  Watch portion size. Choose lentils or beans with each lunch and dinner.   Continue to drink plenty of water and avoid sweetened beverages. Limit added salt. Use only small amounts of oil in cooking.  Expected Outcomes:  Demonstrated interest in learning. Expect positive outcomes  Education material provided:   If problems or questions, patient to contact team via:  Phone  Future DSME appointment: 6 months

## 2022-02-19 ENCOUNTER — Other Ambulatory Visit: Payer: Self-pay | Admitting: Internal Medicine

## 2022-02-19 DIAGNOSIS — E1142 Type 2 diabetes mellitus with diabetic polyneuropathy: Secondary | ICD-10-CM

## 2022-03-08 ENCOUNTER — Ambulatory Visit: Payer: Medicaid Other | Attending: Internal Medicine | Admitting: Internal Medicine

## 2022-03-11 ENCOUNTER — Encounter: Payer: Self-pay | Admitting: Internal Medicine

## 2022-03-11 ENCOUNTER — Other Ambulatory Visit (HOSPITAL_COMMUNITY)
Admission: RE | Admit: 2022-03-11 | Discharge: 2022-03-11 | Disposition: A | Discharge status: Home / Self Care | Payer: Medicaid Other | Source: Ambulatory Visit | Attending: Internal Medicine | Admitting: Internal Medicine

## 2022-03-11 ENCOUNTER — Ambulatory Visit: Payer: Medicaid Other | Attending: Internal Medicine | Admitting: Internal Medicine

## 2022-03-11 VITALS — BP 129/73 | HR 72 | Temp 97.9°F | Ht 60.0 in | Wt 152.0 lb

## 2022-03-11 DIAGNOSIS — Z124 Encounter for screening for malignant neoplasm of cervix: Secondary | ICD-10-CM | POA: Diagnosis not present

## 2022-03-11 DIAGNOSIS — E039 Hypothyroidism, unspecified: Secondary | ICD-10-CM | POA: Insufficient documentation

## 2022-03-11 DIAGNOSIS — E559 Vitamin D deficiency, unspecified: Secondary | ICD-10-CM | POA: Insufficient documentation

## 2022-03-11 DIAGNOSIS — M170 Bilateral primary osteoarthritis of knee: Secondary | ICD-10-CM | POA: Diagnosis not present

## 2022-03-11 DIAGNOSIS — I152 Hypertension secondary to endocrine disorders: Secondary | ICD-10-CM | POA: Insufficient documentation

## 2022-03-11 DIAGNOSIS — Z87891 Personal history of nicotine dependence: Secondary | ICD-10-CM | POA: Insufficient documentation

## 2022-03-11 DIAGNOSIS — I10 Essential (primary) hypertension: Secondary | ICD-10-CM | POA: Insufficient documentation

## 2022-03-11 DIAGNOSIS — Z794 Long term (current) use of insulin: Secondary | ICD-10-CM | POA: Diagnosis not present

## 2022-03-11 DIAGNOSIS — Z7984 Long term (current) use of oral hypoglycemic drugs: Secondary | ICD-10-CM | POA: Insufficient documentation

## 2022-03-11 DIAGNOSIS — Z79899 Other long term (current) drug therapy: Secondary | ICD-10-CM | POA: Insufficient documentation

## 2022-03-11 DIAGNOSIS — E1142 Type 2 diabetes mellitus with diabetic polyneuropathy: Secondary | ICD-10-CM | POA: Insufficient documentation

## 2022-03-11 DIAGNOSIS — E1159 Type 2 diabetes mellitus with other circulatory complications: Secondary | ICD-10-CM | POA: Diagnosis not present

## 2022-03-11 DIAGNOSIS — Z791 Long term (current) use of non-steroidal anti-inflammatories (NSAID): Secondary | ICD-10-CM | POA: Insufficient documentation

## 2022-03-11 DIAGNOSIS — Z1231 Encounter for screening mammogram for malignant neoplasm of breast: Secondary | ICD-10-CM | POA: Diagnosis not present

## 2022-03-11 DIAGNOSIS — E785 Hyperlipidemia, unspecified: Secondary | ICD-10-CM | POA: Insufficient documentation

## 2022-03-11 DIAGNOSIS — G43909 Migraine, unspecified, not intractable, without status migrainosus: Secondary | ICD-10-CM | POA: Diagnosis not present

## 2022-03-11 NOTE — Progress Notes (Signed)
Patient ID: April Mcmahon, female    DOB: 21-Aug-1960  MRN: [id number]  CC: Gynecologic Exam (Pap. Langston Masker pain X3 mo/Already received flu vax. )   Subjective: April Mcmahon is a 62 y.o. female who presents for pap.   Daughter-in-law, Enis Gash, is with her Her concerns today include:  Pt with hx of DM neuropathy, HTN, HL, former tob dep, migraines, Vit D def, DJD knees and plantar fasciitis, Abn LFT with steatosis on U/S, BL CTS, hypothyroid, spinal stenosis cervical spine   GYN History:  Pt is G6P6 Any hx of abn paps?: no Menses regular or irregular?:  postmenopausal How long does menses last? NA Menstrual flow light or heavy?: NA Method of birth control?:  NA Any vaginal dischg at this time?:  Dysuria?:  no Any hx of STI?: no Sexually active with how many partners:  Desires STI screen: yes Last MMG: 01/30/2021 Family hx of uterine, cervical or breast cancer?:  no  BP cuff/device no longer works.  Would like rxn for new device Patient Active Problem List   Diagnosis Date Noted   Contusion of right foot 10/30/2021   Viral URI with cough 10/30/2021   History of diabetes mellitus, type II 10/30/2021   History of hypertension 10/30/2021   Hyperglycemia 10/30/2021   Vitamin B12 deficiency 09/24/2021   DM type 2 with diabetic peripheral neuropathy ([place]) 06/15/2021   Dizziness 06/15/2021   Dyslipidemia 10/17/2020   Uncontrolled type 2 diabetes mellitus with peripheral neuropathy 10/17/2020   Hyperlipidemia associated with type 2 diabetes mellitus ([place]) 05/05/2020   Influenza vaccine needed 01/03/2020   Former smoker 01/03/2020   Acquired hypothyroidism 01/03/2020   Microalbuminuria 11/15/2018   Closed fracture of distal phalanx of great toe 08/23/2018   Muscle cramps 02/21/2018   Hepatic steatosis 12/23/2017   MCI (mild cognitive impairment) 02/14/2017   Diabetic peripheral neuropathy ([place]) 02/14/2017   Memory loss 12/27/2016   Dysfunctional uterine bleeding 08/30/2016    Analgesic rebound headache 06/24/2016   Esophageal dysphagia 06/21/2016   Tobacco dependence 01/20/2016   Plantar fasciitis, bilateral 12/24/2015   DJD (degenerative joint disease) of knee 06/20/2015   Type 2 diabetes mellitus with peripheral neuropathy ([place]) 06/19/2015   Essential hypertension 06/19/2015   Vitamin D deficiency 09/24/2013   Migraine 05/15/2013   Low back pain 08/18/2012     Current Outpatient Medications on File Prior to Visit  Medication Sig Dispense Refill   Accu-Chek Softclix Lancets lancets Check blood sugar three times daily. Dx E11.42 100 each 12   Accu-Chek Softclix Lancets lancets Use as instructed to Check blood sugar three times daily 100 each 12   amLODipine (NORVASC) 10 MG tablet Take 1 tablet (10 mg total) by mouth daily. 30 tablet 11   atorvastatin (LIPITOR) 20 MG tablet Take 1 tablet (20 mg total) by mouth daily. 90 tablet 3   Blood Glucose Monitoring Suppl (ACCU-CHEK GUIDE ME) w/Device KIT Check blood sugar three times daily. Dx E11.42 1 kit 0   diclofenac Sodium (VOLTAREN) 1 % GEL Apply 2 g topically 4 (four) times daily. 100 g 3   DULoxetine (CYMBALTA) 60 MG capsule Take 60 mg by mouth daily. Take 1 capsule by mouth every day.     empagliflozin (JARDIANCE) 25 MG TABS tablet Take 1 tablet (25 mg total) by mouth daily before breakfast. 90 tablet 3   fluticasone (FLONASE) 50 MCG/ACT nasal spray Place 2 sprays into both nostrils daily. (Patient taking differently: Place 2 sprays into both nostrils daily as needed  for allergies.) 16 g 0   glucose blood (ACCU-CHEK GUIDE) test strip Use as instructed 100 each 12   insulin glargine (LANTUS SOLOSTAR) 100 UNIT/ML Solostar Pen Inject 14 Units into the skin daily. 15 mL 3   Insulin Pen Needle 32G X 4 MM MISC 1 Device by Does not apply route daily in the afternoon. 100 each 3   levothyroxine (SYNTHROID) 50 MCG tablet Take 1 tablet (50 mcg total) by mouth daily. 30 tablet 4   loratadine (CLARITIN) 10 MG tablet Take  1 tablet (10 mg total) by mouth daily. (Patient taking differently: Take 10 mg by mouth at bedtime as needed for allergies.) 30 tablet 3   meclizine (ANTIVERT) 25 MG tablet Take 25 mg by mouth 3 (three) times daily as needed for dizziness. Take 1 tablet by mouth 3 times daily as needed for dizziness.     Meloxicam 15 MG TBDP Take 15 mg by mouth daily. 30 tablet 2   metFORMIN (GLUCOPHAGE) 1000 MG tablet Take 1 tablet (1,000 mg total) by mouth 2 (two) times daily with a meal. 180 tablet 3   SUMAtriptan (IMITREX) 50 MG tablet Take 1 tablet (50 mg total) by mouth every 2 (two) hours as needed for migraine. Take 1 tab at start of headache; may repeat in 2 hrs if headache still present.  Max 2 tabs/24 hr. 10 tablet 2   valsartan (DIOVAN) 40 MG tablet Take 20 mg by mouth daily.     gabapentin (NEURONTIN) 600 MG tablet Take 0.5 tablets (300 mg total) by mouth 3 (three) times daily. 45 tablet 1   [DISCONTINUED] venlafaxine (EFFEXOR) 37.5 MG tablet Take 1 tablet (37.5 mg total) by mouth 2 (two) times daily with a meal. 60 tablet 11   No current facility-administered medications on file prior to visit.    Allergies  Allergen Reactions   Elavil [Amitriptyline Hcl] Diarrhea and Nausea And Vomiting    Social History   Socioeconomic History   Marital status: Married    Spouse name: Not on file   Number of children: Not on file   Years of education: Not on file   Highest education level: Not on file  Occupational History   Not on file  Tobacco Use   Smoking status: Former   Smokeless tobacco: Never  Vaping Use   Vaping Use: Never used  Substance and Sexual Activity   Alcohol use: No   Drug use: No   Sexual activity: Not on file  Other Topics Concern   Not on file  Social History Narrative   Not on file   Social Determinants of Health   Financial Resource Strain: Not on file  Food Insecurity: Not on file  Transportation Needs: Not on file  Physical Activity: Not on file  Stress: Not on  file  Social Connections: Not on file  Intimate Partner Violence: Not on file    Family History  Problem Relation Age of Onset   Migraines Mother    Esophageal cancer Other    Breast cancer Neg Hx    Colon cancer Neg Hx    Rectal cancer Neg Hx    Stomach cancer Neg Hx     Past Surgical History:  Procedure Laterality Date   no previous surgeries      ROS: Review of Systems Negative except as stated above  PHYSICAL EXAM: BP 129/73 (BP Location: Left Arm, Patient Position: Sitting, Cuff Size: Normal)   Pulse 72   Temp 97.9 F (36.6 C) (  Oral)   Ht 5' (1.524 m)   Wt 152 lb (68.9 kg)   SpO2 98%   BMI 29.69 kg/m   Physical Exam  General appearance - alert, well appearing, and in no distress Mental status - normal mood, behavior, speech, dress, motor activity, and thought processes Breasts - CMA Clarrisa present for breast and pelvic exam:  breasts appear normal, no suspicious masses, no skin or nipple changes or axillary nodes Pelvic - normal external genitalia, vulva, vagina, cervix, uterus and adnexa      Latest Ref Rng & Units 02/02/2022    2:51 PM 07/08/2021    2:13 PM 12/22/2020   10:14 AM  CMP  Glucose 70 - 99 mg/dL 127  169  90   BUN 8 - 27 mg/dL '17  10  10   '$ Creatinine 0.57 - 1.00 mg/dL 0.66  0.62  0.66   Sodium 134 - 144 mmol/L 145  140  144   Potassium 3.5 - 5.2 mmol/L 4.2  3.9  4.5   Chloride 96 - 106 mmol/L 108  111  106   CO2 20 - 29 mmol/L '22  23  22   '$ Calcium 8.7 - 10.3 mg/dL 9.4  9.3  9.6   Total Protein 6.0 - 8.5 g/dL 7.5   7.7   Total Bilirubin 0.0 - 1.2 mg/dL 0.3   0.5   Alkaline Phos 44 - 121 IU/L 119   126   AST 0 - 40 IU/L 18   27   ALT 0 - 32 IU/L 18   32    Lipid Panel     Component Value Date/Time   CHOL 144 02/02/2022 1451   TRIG 262 (H) 02/02/2022 1451   HDL 55 02/02/2022 1451   CHOLHDL 2.6 02/02/2022 1451   CHOLHDL 3.3 10/20/2015 1625   VLDL 46 (H) 10/20/2015 1625   LDLCALC 49 02/02/2022 1451    CBC    Component Value  Date/Time   WBC 7.8 02/02/2022 1451   WBC 9.7 10/10/2017 1941   RBC 4.80 02/02/2022 1451   RBC 4.81 10/10/2017 1941   HGB 12.7 02/02/2022 1451   HCT 38.6 02/02/2022 1451   PLT 259 02/02/2022 1451   MCV 80 02/02/2022 1451   MCH 26.5 (L) 02/02/2022 1451   MCH 26.4 10/10/2017 1941   MCHC 32.9 02/02/2022 1451   MCHC 31.0 10/10/2017 1941   RDW 14.8 02/02/2022 1451   LYMPHSABS 3.2 (H) 06/15/2021 1615   MONOABS 335 10/20/2015 1625   EOSABS 0.2 06/15/2021 1615   BASOSABS 0.0 06/15/2021 1615    ASSESSMENT AND PLAN: 1. Pap smear for cervical cancer screening - Cytology - PAP - Cervicovaginal ancillary only  2. Encounter for screening mammogram for malignant neoplasm of breast  - MM Digital Screening; Future  3. Hypertension associated with type 2 diabetes mellitus ([place]) Prescription given for her to get a new blood pressure monitoring device. - For home use only DME Other see comment    [employer] Language interpreter used during this encounter. #Narad, [id number]  Patient was given the opportunity to ask questions.  Patient verbalized understanding of the plan and was able to repeat key elements of the plan.   This documentation was completed using Radio producer.  Any transcriptional errors are unintentional.  No orders of the defined types were placed in this encounter.    Requested Prescriptions    No prescriptions requested or ordered in this encounter    No follow-ups on file.  Karle Plumber, MD, FACP

## 2022-03-12 ENCOUNTER — Ambulatory Visit: Payer: Medicaid Other | Attending: Family Medicine

## 2022-03-12 ENCOUNTER — Other Ambulatory Visit: Payer: Self-pay | Admitting: Internal Medicine

## 2022-03-12 DIAGNOSIS — E039 Hypothyroidism, unspecified: Secondary | ICD-10-CM

## 2022-03-12 LAB — CERVICOVAGINAL ANCILLARY ONLY
Bacterial Vaginitis (gardnerella): POSITIVE — AB
Candida Glabrata: NEGATIVE
Candida Vaginitis: POSITIVE — AB
Chlamydia: NEGATIVE
Comment: NEGATIVE
Comment: NEGATIVE
Comment: NEGATIVE
Comment: NEGATIVE
Comment: NORMAL
Neisseria Gonorrhea: NEGATIVE

## 2022-03-12 MED ORDER — METRONIDAZOLE 500 MG PO TABS: 500.0000 mg | ORAL_TABLET | Freq: Two times a day (BID) | ORAL | 0 refills | Status: DC

## 2022-03-12 MED ORDER — FLUCONAZOLE 150 MG PO TABS: 150.0000 mg | ORAL_TABLET | Freq: Once | ORAL | 0 refills | Status: AC

## 2022-03-13 LAB — TSH: TSH: 4.78 u[IU]/mL — ABNORMAL HIGH (ref 0.450–4.500)

## 2022-03-16 LAB — CYTOLOGY - PAP
Comment: NEGATIVE
Diagnosis: NEGATIVE
High risk HPV: NEGATIVE

## 2022-03-28 ENCOUNTER — Other Ambulatory Visit: Payer: Self-pay | Admitting: Internal Medicine

## 2022-03-29 ENCOUNTER — Other Ambulatory Visit: Payer: Self-pay | Admitting: Internal Medicine

## 2022-03-29 ENCOUNTER — Encounter: Payer: Self-pay | Admitting: Internal Medicine

## 2022-03-29 DIAGNOSIS — E039 Hypothyroidism, unspecified: Secondary | ICD-10-CM

## 2022-03-29 DIAGNOSIS — I152 Hypertension secondary to endocrine disorders: Secondary | ICD-10-CM

## 2022-03-29 MED ORDER — ATORVASTATIN CALCIUM 20 MG PO TABS: 20.0000 mg | ORAL_TABLET | Freq: Every day | ORAL | 3 refills | Status: DC

## 2022-03-29 MED ORDER — AMLODIPINE BESYLATE 10 MG PO TABS: 10.0000 mg | ORAL_TABLET | Freq: Every day | ORAL | 1 refills | Status: DC

## 2022-03-29 MED ORDER — VALSARTAN 40 MG PO TABS: 20.0000 mg | ORAL_TABLET | Freq: Every day | ORAL | 1 refills | Status: DC

## 2022-03-29 MED ORDER — LEVOTHYROXINE SODIUM 50 MCG PO TABS: 50.0000 ug | ORAL_TABLET | Freq: Every day | ORAL | 1 refills | Status: DC

## 2022-03-29 MED ORDER — VALSARTAN 40 MG PO TABS
20.0000 mg | ORAL_TABLET | Freq: Every day | ORAL | 1 refills | Status: DC
  Filled 2022-03-29: qty 90, 180d supply, fill #0

## 2022-03-30 ENCOUNTER — Other Ambulatory Visit: Payer: Self-pay

## 2022-04-28 LAB — HM DIABETES EYE EXAM

## 2022-05-07 ENCOUNTER — Ambulatory Visit
Admission: RE | Admit: 2022-05-07 | Discharge: 2022-05-07 | Disposition: A | Discharge status: Home / Self Care | Payer: Medicaid Other | Source: Ambulatory Visit | Attending: Internal Medicine | Admitting: Internal Medicine

## 2022-05-07 DIAGNOSIS — Z1231 Encounter for screening mammogram for malignant neoplasm of breast: Secondary | ICD-10-CM

## 2022-05-26 ENCOUNTER — Other Ambulatory Visit: Payer: Self-pay

## 2022-06-13 ENCOUNTER — Other Ambulatory Visit: Payer: Self-pay | Admitting: Internal Medicine

## 2022-06-13 DIAGNOSIS — E1142 Type 2 diabetes mellitus with diabetic polyneuropathy: Secondary | ICD-10-CM

## 2022-06-14 NOTE — Telephone Encounter (Signed)
Requested Prescriptions  Pending Prescriptions Disp Refills   ACCU-CHEK GUIDE test strip [Pharmacy Med Name: ACCU-CHEK GUIDE TEST STRIP] 100 strip 0    Sig: USE AS INSTRUCTED     Endocrinology: Diabetes - Testing Supplies Passed - 06/13/2022  5:42 PM      Passed - Valid encounter within last 12 months    Recent Outpatient Visits           3 months ago Pap smear for cervical cancer screening   [place] [facility] Jonah Blue B, MD   4 months ago Hypertension associated with type 2 diabetes mellitus [facility])   [place] [facility] Jonah Blue B, MD   8 months ago Type 2 diabetes mellitus with peripheral neuropathy [facility])   [place] [facility] Marcine Matar, MD   10 months ago Urge incontinence of urine   [place] [facility] Jonah Blue B, MD   1 year ago Type 2 diabetes mellitus with peripheral neuropathy [facility])   [place] [facility] Marcine Matar, MD       Future Appointments             In 3 weeks Marcine Matar, MD [facility]

## 2022-07-08 ENCOUNTER — Ambulatory Visit: Payer: Medicaid Other | Attending: Internal Medicine | Admitting: Internal Medicine

## 2022-07-08 ENCOUNTER — Encounter: Payer: Self-pay | Admitting: Internal Medicine

## 2022-07-08 VITALS — BP 164/79 | HR 74 | Resp 18 | Ht 60.0 in | Wt 149.8 lb

## 2022-07-08 DIAGNOSIS — M4802 Spinal stenosis, cervical region: Secondary | ICD-10-CM | POA: Diagnosis not present

## 2022-07-08 DIAGNOSIS — Z794 Long term (current) use of insulin: Secondary | ICD-10-CM | POA: Diagnosis not present

## 2022-07-08 DIAGNOSIS — E039 Hypothyroidism, unspecified: Secondary | ICD-10-CM | POA: Insufficient documentation

## 2022-07-08 DIAGNOSIS — E559 Vitamin D deficiency, unspecified: Secondary | ICD-10-CM | POA: Diagnosis not present

## 2022-07-08 DIAGNOSIS — Z79890 Hormone replacement therapy (postmenopausal): Secondary | ICD-10-CM | POA: Diagnosis not present

## 2022-07-08 DIAGNOSIS — E1142 Type 2 diabetes mellitus with diabetic polyneuropathy: Secondary | ICD-10-CM | POA: Diagnosis present

## 2022-07-08 DIAGNOSIS — M170 Bilateral primary osteoarthritis of knee: Secondary | ICD-10-CM | POA: Insufficient documentation

## 2022-07-08 DIAGNOSIS — Z7984 Long term (current) use of oral hypoglycemic drugs: Secondary | ICD-10-CM | POA: Insufficient documentation

## 2022-07-08 DIAGNOSIS — Z79899 Other long term (current) drug therapy: Secondary | ICD-10-CM | POA: Diagnosis not present

## 2022-07-08 DIAGNOSIS — E1169 Type 2 diabetes mellitus with other specified complication: Secondary | ICD-10-CM | POA: Insufficient documentation

## 2022-07-08 DIAGNOSIS — I152 Hypertension secondary to endocrine disorders: Secondary | ICD-10-CM

## 2022-07-08 DIAGNOSIS — E785 Hyperlipidemia, unspecified: Secondary | ICD-10-CM | POA: Diagnosis not present

## 2022-07-08 DIAGNOSIS — G43909 Migraine, unspecified, not intractable, without status migrainosus: Secondary | ICD-10-CM | POA: Diagnosis not present

## 2022-07-08 DIAGNOSIS — I10 Essential (primary) hypertension: Secondary | ICD-10-CM | POA: Insufficient documentation

## 2022-07-08 DIAGNOSIS — E1159 Type 2 diabetes mellitus with other circulatory complications: Secondary | ICD-10-CM

## 2022-07-08 LAB — POCT GLYCOSYLATED HEMOGLOBIN (HGB A1C): HbA1c, POC (controlled diabetic range): 7.1 % — AB (ref 0.0–7.0)

## 2022-07-08 LAB — GLUCOSE, POCT (MANUAL RESULT ENTRY): POC Glucose: 182 mg/dL — AB (ref 70–99)

## 2022-07-08 MED ORDER — VALSARTAN 40 MG PO TABS: 20.0000 mg | ORAL_TABLET | Freq: Every day | ORAL | 1 refills | Status: DC

## 2022-07-08 NOTE — Progress Notes (Signed)
Left shoulder and arm pain. Finger pain.

## 2022-07-08 NOTE — Progress Notes (Signed)
Patient ID: April Mcmahon, female    DOB: 02-19-1960  MRN: [id number]  CC: Diabetes and Hypertension   Subjective: April Mcmahon is a 62 y.o. female who presents for chronic ds management.  Daughter-in-law is with her. Her concerns today include:  Pt with hx of DM neuropathy, HTN, HL, former tob dep, migraines, Vit D def, DJD knees and plantar fasciitis, Abn LFT with steatosis on U/S, BL CTS, hypothyroid, spinal stenosis cervical spine   [employer] Language interpreter used during this encounter. #Minu [id number]  DM: Results for orders placed or performed in visit on 07/08/22  POCT glucose (manual entry)  Result Value Ref Range   POC Glucose 182 (A) 70 - 99 mg/dl  POCT glycosylated hemoglobin (Hb A1C)  Result Value Ref Range   Hemoglobin A1C     HbA1c POC (<> result, manual entry)     HbA1c, POC (prediabetic range)     HbA1c, POC (controlled diabetic range) 7.1 (A) 0.0 - 7.0 %  A1C improved slightly from 7.6.  Checks BS BID before BF with range 112-120 and before dinner 160-180.  No low BS Compliant with Lantus 14 units, Metformin 1 gram BID and Jardiance 25 mg daily.  Does well with eating habits Walks a little daily Has upcoming appt with Dr. Brooks Sailors later this mth  HTN: reports compliance with Diovan 40 mg 1/2 tab daily and Norvasc 10 mg daily.  Out of Diovan for 5 days No chest pains or shortness of breath.    Thyroid:  taking Levothyroxine 50 mcg.  Last TSH 4.7 in 03/2022  HL:  taking and tolerating Lipitor Patient Active Problem List   Diagnosis Date Noted   Contusion of right foot 10/30/2021   Viral URI with cough 10/30/2021   History of diabetes mellitus, type II 10/30/2021   History of hypertension 10/30/2021   Hyperglycemia 10/30/2021   Vitamin B12 deficiency 09/24/2021   DM type 2 with diabetic peripheral neuropathy (HCC) 06/15/2021   Dizziness 06/15/2021   Dyslipidemia 10/17/2020   Uncontrolled type 2 diabetes mellitus with peripheral neuropathy 10/17/2020    Hyperlipidemia associated with type 2 diabetes mellitus (HCC) 05/05/2020   Influenza vaccine needed 01/03/2020   Former smoker 01/03/2020   Acquired hypothyroidism 01/03/2020   Microalbuminuria 11/15/2018   Closed fracture of distal phalanx of great toe 08/23/2018   Muscle cramps 02/21/2018   Hepatic steatosis 12/23/2017   MCI (mild cognitive impairment) 02/14/2017   Diabetic peripheral neuropathy (HCC) 02/14/2017   Memory loss 12/27/2016   Dysfunctional uterine bleeding 08/30/2016   Analgesic rebound headache 06/24/2016   Esophageal dysphagia 06/21/2016   Tobacco dependence 01/20/2016   Plantar fasciitis, bilateral 12/24/2015   DJD (degenerative joint disease) of knee 06/20/2015   Type 2 diabetes mellitus with peripheral neuropathy (HCC) 06/19/2015   Essential hypertension 06/19/2015   Vitamin D deficiency 09/24/2013   Migraine 05/15/2013   Low back pain 08/18/2012     Current Outpatient Medications on File Prior to Visit  Medication Sig Dispense Refill   Accu-Chek Softclix Lancets lancets Check blood sugar three times daily. Dx E11.42 100 each 12   Accu-Chek Softclix Lancets lancets Use as instructed to Check blood sugar three times daily 100 each 12   amLODipine (NORVASC) 10 MG tablet Take 1 tablet (10 mg total) by mouth daily. 90 tablet 1   atorvastatin (LIPITOR) 20 MG tablet Take 1 tablet (20 mg total) by mouth daily. 90 tablet 3   Blood Glucose Monitoring Suppl (ACCU-CHEK GUIDE ME) w/Device KIT  Check blood sugar three times daily. Dx E11.42 1 kit 0   empagliflozin (JARDIANCE) 25 MG TABS tablet Take 1 tablet (25 mg total) by mouth daily before breakfast. 90 tablet 3   fluticasone (FLONASE) 50 MCG/ACT nasal spray Place 2 sprays into both nostrils daily. (Patient taking differently: Place 2 sprays into both nostrils daily as needed for allergies.) 16 g 0   glucose blood (ACCU-CHEK GUIDE) test strip USE AS INSTRUCTED 100 strip 0   insulin glargine (LANTUS SOLOSTAR) 100 UNIT/ML  Solostar Pen Inject 14 Units into the skin daily. 15 mL 3   Insulin Pen Needle 32G X 4 MM MISC 1 Device by Does not apply route daily in the afternoon. 100 each 3   levothyroxine (SYNTHROID) 50 MCG tablet Take 1 tablet (50 mcg total) by mouth daily. 90 tablet 1   loratadine (CLARITIN) 10 MG tablet Take 1 tablet (10 mg total) by mouth daily. (Patient taking differently: Take 10 mg by mouth at bedtime as needed for allergies.) 30 tablet 3   Meloxicam 15 MG TBDP Take 15 mg by mouth daily. 30 tablet 2   metFORMIN (GLUCOPHAGE) 1000 MG tablet Take 1 tablet (1,000 mg total) by mouth 2 (two) times daily with a meal. 180 tablet 3   SUMAtriptan (IMITREX) 50 MG tablet Take 1 tablet (50 mg total) by mouth every 2 (two) hours as needed for migraine. Take 1 tab at start of headache; may repeat in 2 hrs if headache still present.  Max 2 tabs/24 hr. 10 tablet 2   diclofenac Sodium (VOLTAREN) 1 % GEL Apply 2 g topically 4 (four) times daily. (Patient not taking: Reported on 07/08/2022) 100 g 3   gabapentin (NEURONTIN) 600 MG tablet Take 0.5 tablets (300 mg total) by mouth 3 (three) times daily. 45 tablet 1   [DISCONTINUED] venlafaxine (EFFEXOR) 37.5 MG tablet Take 1 tablet (37.5 mg total) by mouth 2 (two) times daily with a meal. 60 tablet 11   No current facility-administered medications on file prior to visit.    Allergies  Allergen Reactions   Cymbalta [Duloxetine Hcl]     Nausea and dizziness   Elavil [Amitriptyline Hcl] Diarrhea and Nausea And Vomiting    Social History   Socioeconomic History   Marital status: Married    Spouse name: Not on file   Number of children: Not on file   Years of education: Not on file   Highest education level: Not on file  Occupational History   Not on file  Tobacco Use   Smoking status: Never   Smokeless tobacco: Never  Vaping Use   Vaping Use: Never used  Substance and Sexual Activity   Alcohol use: No   Drug use: No   Sexual activity: Not on file  Other  Topics Concern   Not on file  Social History Narrative   Not on file   Social Determinants of Health   Financial Resource Strain: Not on file  Food Insecurity: Not on file  Transportation Needs: Not on file  Physical Activity: Not on file  Stress: Not on file  Social Connections: Not on file  Intimate Partner Violence: Not on file    Family History  Problem Relation Age of Onset   Migraines Mother    Esophageal cancer Other    Breast cancer Neg Hx    Colon cancer Neg Hx    Rectal cancer Neg Hx    Stomach cancer Neg Hx     Past Surgical History:  Procedure Laterality Date   no previous surgeries      ROS: Review of Systems Negative except as stated above  PHYSICAL EXAM: BP (!) 164/79 (BP Location: Left Arm, Patient Position: Sitting, Cuff Size: Normal)   Pulse 74   Resp 18   Ht 5' (1.524 m)   Wt 149 lb 12.8 oz (67.9 kg)   SpO2 98%   BMI 29.26 kg/m   Physical Exam  General appearance - alert, well appearing, older female and in no distress Mental status - normal mood, behavior, speech, dress, motor activity, and thought processes Neck - supple, no significant adenopathy Chest - clear to auscultation, no wheezes, rales or rhonchi, symmetric air entry Heart - normal rate, regular rhythm, normal S1, S2, no murmurs, rubs, clicks or gallops Extremities - peripheral pulses normal, no pedal edema, no clubbing or cyanosis      Latest Ref Rng & Units 02/02/2022    2:51 PM 07/08/2021    2:13 PM 12/22/2020   10:14 AM  CMP  Glucose 70 - 99 mg/dL 161  096  90   BUN 8 - 27 mg/dL 17  10  10    Creatinine 0.57 - 1.00 mg/dL 0.45  4.09  8.11   Sodium 134 - 144 mmol/L 145  140  144   Potassium 3.5 - 5.2 mmol/L 4.2  3.9  4.5   Chloride 96 - 106 mmol/L 108  111  106   CO2 20 - 29 mmol/L 22  23  22    Calcium 8.7 - 10.3 mg/dL 9.4  9.3  9.6   Total Protein 6.0 - 8.5 g/dL 7.5   7.7   Total Bilirubin 0.0 - 1.2 mg/dL 0.3   0.5   Alkaline Phos 44 - 121 IU/L 119   126   AST 0 - 40  IU/L 18   27   ALT 0 - 32 IU/L 18   32    Lipid Panel     Component Value Date/Time   CHOL 144 02/02/2022 1451   TRIG 262 (H) 02/02/2022 1451   HDL 55 02/02/2022 1451   CHOLHDL 2.6 02/02/2022 1451   CHOLHDL 3.3 10/20/2015 1625   VLDL 46 (H) 10/20/2015 1625   LDLCALC 49 02/02/2022 1451    CBC    Component Value Date/Time   WBC 7.8 02/02/2022 1451   WBC 9.7 10/10/2017 1941   RBC 4.80 02/02/2022 1451   RBC 4.81 10/10/2017 1941   HGB 12.7 02/02/2022 1451   HCT 38.6 02/02/2022 1451   PLT 259 02/02/2022 1451   MCV 80 02/02/2022 1451   MCH 26.5 (L) 02/02/2022 1451   MCH 26.4 10/10/2017 1941   MCHC 32.9 02/02/2022 1451   MCHC 31.0 10/10/2017 1941   RDW 14.8 02/02/2022 1451   LYMPHSABS 3.2 (H) 06/15/2021 1615   MONOABS 335 10/20/2015 1625   EOSABS 0.2 06/15/2021 1615   BASOSABS 0.0 06/15/2021 1615    ASSESSMENT AND PLAN:  1. Type 2 diabetes mellitus with peripheral neuropathy (HCC) Close to goal.  Will have her continue current dose of Lantus insulin 14 units daily, metformin 1 g twice a day and Jardiance 25 mg daily.  Encourage healthy eating habits and continue trying to move is much as she can. - POCT glucose (manual entry) - POCT glycosylated hemoglobin (Hb A1C)  2. Hyperlipidemia associated with type 2 diabetes mellitus (HCC) Continue atorvastatin 20 mg daily.  Last LDL was at goal.  3. Hypertension associated with type 2 diabetes mellitus [facility]) Not  at goal.  She has been out of Diovan.  Refill sent.  Continue Norvasc 10 mg daily. - valsartan (DIOVAN) 40 MG tablet; Take 0.5 tablets (20 mg total) by mouth daily.  Dispense: 90 tablet; Refill: 1  4. Acquired hypothyroidism Continue levothyroxine 50 mcg daily. - TSH; Future   Patient was given the opportunity to ask questions.  Patient verbalized understanding of the plan and was able to repeat key elements of the plan.   This documentation was completed using Paediatric nurse.  Any transcriptional  errors are unintentional.  Orders Placed This Encounter  Procedures   TSH   POCT glucose (manual entry)   POCT glycosylated hemoglobin (Hb A1C)     Requested Prescriptions   Signed Prescriptions Disp Refills   valsartan (DIOVAN) 40 MG tablet 90 tablet 1    Sig: Take 0.5 tablets (20 mg total) by mouth daily.    Return in about 4 months (around 11/07/2022).  Jonah Blue, MD, FACP

## 2022-07-09 ENCOUNTER — Ambulatory Visit: Payer: Medicaid Other | Attending: Family Medicine

## 2022-07-10 ENCOUNTER — Other Ambulatory Visit: Payer: Self-pay | Admitting: Internal Medicine

## 2022-07-10 DIAGNOSIS — E039 Hypothyroidism, unspecified: Secondary | ICD-10-CM

## 2022-07-10 LAB — TSH: TSH: 2.1 u[IU]/mL (ref 0.450–4.500)

## 2022-07-10 MED ORDER — LEVOTHYROXINE SODIUM 50 MCG PO TABS: 50.0000 ug | ORAL_TABLET | Freq: Every day | ORAL | 1 refills | Status: DC

## 2022-07-30 ENCOUNTER — Ambulatory Visit: Payer: Medicaid Other | Admitting: Internal Medicine

## 2022-08-19 ENCOUNTER — Encounter: Payer: Self-pay | Admitting: Dietician

## 2022-08-19 ENCOUNTER — Encounter: Payer: Medicaid Other | Attending: Internal Medicine | Admitting: Dietician

## 2022-08-19 VITALS — Wt 150.0 lb

## 2022-08-19 DIAGNOSIS — E1142 Type 2 diabetes mellitus with diabetic polyneuropathy: Secondary | ICD-10-CM | POA: Insufficient documentation

## 2022-08-19 NOTE — Patient Instructions (Signed)
Blood Sugar Goals: 80-130 before breakfast Less than 180 2 hours after a meal   Continue to walk.  Walking after most meals will lower your blood sugar.   Continue to eat plenty of vegetables. Continue to choose whole grains.  Watch portion size. Choose lentils or beans with each lunch and dinner.   Continue to drink plenty of water and avoid sweetened beverages. Limit added salt. Use only small amounts of oil in cooking.   

## 2022-08-19 NOTE — Progress Notes (Signed)
150 lbs 1300   Diabetes Self-Management Education  Visit Type: Follow-up  Appt. Start Time: 1300 Appt. End Time: 1330  08/20/2022  [name], identified by name and date of birth, is a 62 y.o. female with a diagnosis of Diabetes:  .   ASSESSMENT  Patient is here today with her daughter-in-law and CAP/[place] interpretor Bina.  She was last seen by this RD on 02/18/2022.  Fasting blood glucose was 126 this am, 103 yesterday morning, 101 last night post dinner.  High of 234 after overeating rice but overall below 145. 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  History includes Type 2 Diabetes (>5 years), HTN, HLD, vitamin D deficiency Medications include:  Jardiance, Metformin, Lantus 14 units q HS, synthroid Labs noted:  A1C is 7.1% 07/08/2022 and 01/27/2022 decreased from 7.6% 07/2021 and 11.3% 09/26/2020.   eGFR 100, cholesterol 144, HDL 55, LDL 49, Triglycerides 262 on 02/02/22   Patient lives with husband, 3 children and 2 grandchildren. She is from [place] and has been in the [name] since 2011. She is unable to read.  Walks daily for 10-15 minutes bid. She does not eat chicken or other meats or eggs and does eat dairy, occasional fish. Uses small amounts of salt.  Avoids sweets.  Chooses whole grains and states that she tries to be mindful of portions. Continues to use small amounts of added salt on fresh vegetables  Weight 150 lb (68 kg). Body mass index is 29.29 kg/m.   Diabetes Self-Management Education - 08/19/22 1532       Visit Information   Visit Type Follow-up      Psychosocial Assessment   Patient Belief/Attitude about Diabetes Motivated to manage diabetes    Self-care barriers None    Self-management support Doctor's office    Other persons present Patient    Patient Concerns Nutrition/Meal planning;Glycemic Control;Healthy Lifestyle    Special Needs None    Preferred Learning Style No preference indicated    Learning Readiness Ready     How often do you need to have someone help you when you read instructions, pamphlets, or other written materials from your doctor or pharmacy? 5 - Always      Pre-Education Assessment   Patient understands the diabetes disease and treatment process. Comprehends key points    Patient understands incorporating nutritional management into lifestyle. Comprehends key points    Patient undertands incorporating physical activity into lifestyle. Comprehends key points    Patient understands using medications safely. Comprehends key points    Patient understands monitoring blood glucose, interpreting and using results Comprehends key points    Patient understands prevention, detection, and treatment of acute complications. Comprehends key points    Patient understands prevention, detection, and treatment of chronic complications. Compreheands key points    Patient understands how to develop strategies to address psychosocial issues. Comprehends key points    Patient understands how to develop strategies to promote health/change behavior. Comprehends key points      Complications   Last HgB A1C per patient/outside source 7.1 %   07/08/22 and 01/27/2022   How often do you check your blood sugar? 1-2 times/day    Fasting Blood glucose range (mg/dL) 32-355    Postprandial Blood glucose range (mg/dL) 732-202;>542    Number of hypoglycemic episodes per month 0      Dietary Intake   Breakfast [employer] roti and tea with whole milk    Snack (morning) plain yogurt drink (homemade  yogurt mixed with water)    Lunch none    Dinner [employer] roti, curry with green beans    Snack (evening) pear    Beverage(s) water, tea with whole milk      Activity / Exercise   Activity / Exercise Type Light (walking / raking leaves)    How many days per week do you exercise? 5    How many minutes per day do you exercise? 60    Total minutes per week of exercise 300      Patient Education   Previous Diabetes Education Yes (please  comment)   02/2022   Disease Pathophysiology --   reviewed current meal habits   Healthy Eating Other (comment);Food label reading, portion sizes and measuring food.    Being Active Role of exercise on diabetes management, blood pressure control and cardiac health.    Medications Reviewed patients medication for diabetes, action, purpose, timing of dose and side effects.    Monitoring Taught/evaluated SMBG meter.    Acute complications Educated on sick day management    Diabetes Stress and Support Identified and addressed patients feelings and concerns about diabetes;Worked with patient to identify barriers to care and solutions      Individualized Goals (developed by patient)   Nutrition General guidelines for healthy choices and portions discussed    Physical Activity Exercise 5-7 days per week;30 minutes per day    Medications take my medication as prescribed    Monitoring  Test my blood glucose as discussed    Problem Solving Addressing barriers to behavior change;Eating Pattern    Reducing Risk examine blood glucose patterns;do foot checks daily;treat hypoglycemia with 15 grams of carbs if blood glucose less than 70mg /dL      Patient Self-Evaluation of Goals - Patient rates self as meeting previously set goals (% of time)   Nutrition >75% (most of the time)    Physical Activity >75% (most of the time)    Medications >75% (most of the time)    Monitoring >75% (most of the time)    Problem Solving and behavior change strategies  >75% (most of the time)    Reducing Risk (treating acute and chronic complications) >75% (most of the time)    Health Coping >75% (most of the time)      Post-Education Assessment   Patient understands the diabetes disease and treatment process. Comprehends key points    Patient understands incorporating nutritional management into lifestyle. Comprehends key points    Patient undertands incorporating physical activity into lifestyle. Comprehends key points     Patient understands using medications safely. Comphrehends key points    Patient understands monitoring blood glucose, interpreting and using results Comprehends key points    Patient understands prevention, detection, and treatment of acute complications. Comprehends key points    Patient understands prevention, detection, and treatment of chronic complications. Comprehends key points    Patient understands how to develop strategies to address psychosocial issues. Comprehends key points    Patient understands how to develop strategies to promote health/change behavior. Comprehends key points      Outcomes   Expected Outcomes Demonstrated interest in learning. Expect positive outcomes    Future DMSE PRN    Program Status Completed      Subsequent Visit   Since your last visit have you experienced any weight changes? Loss    Weight Loss (lbs) 2    Since your last visit, are you checking your blood glucose at least once a  day? Yes             Individualized Plan for Diabetes Self-Management Training:   Learning Objective:  Patient will have a greater understanding of diabetes self-management. Patient education plan is to attend individual and/or group sessions per assessed needs and concerns.   Plan:   Patient Instructions  Blood Sugar Goals: 80-130 before breakfast Less than 180 2 hours after a meal   Continue to walk.  Walking after most meals will lower your blood sugar.   Continue to eat plenty of vegetables. Continue to choose whole grains.  Watch portion size. Choose lentils or beans with each lunch and dinner.   Continue to drink plenty of water and avoid sweetened beverages. Limit added salt. Use only small amounts of oil in cooking.    Expected Outcomes:  Demonstrated interest in learning. Expect positive outcomes  Education material provided:   If problems or questions, patient to contact team via:  Phone  Future DSME appointment: PRN

## 2022-08-21 ENCOUNTER — Ambulatory Visit
Admission: EM | Admit: 2022-08-21 | Discharge: 2022-08-21 | Disposition: A | Discharge status: Home / Self Care | Payer: Medicaid Other | Attending: Internal Medicine | Admitting: Internal Medicine

## 2022-08-21 DIAGNOSIS — J029 Acute pharyngitis, unspecified: Secondary | ICD-10-CM | POA: Diagnosis present

## 2022-08-21 DIAGNOSIS — Z1152 Encounter for screening for COVID-19: Secondary | ICD-10-CM | POA: Insufficient documentation

## 2022-08-21 DIAGNOSIS — R051 Acute cough: Secondary | ICD-10-CM | POA: Diagnosis not present

## 2022-08-21 DIAGNOSIS — R519 Headache, unspecified: Secondary | ICD-10-CM | POA: Insufficient documentation

## 2022-08-21 LAB — POCT RAPID STREP A (OFFICE): Rapid Strep A Screen: NEGATIVE

## 2022-08-21 MED ORDER — BENZONATATE 100 MG PO CAPS: 100.0000 mg | ORAL_CAPSULE | Freq: Three times a day (TID) | ORAL | 0 refills | Status: DC

## 2022-08-21 NOTE — ED Provider Notes (Signed)
[facility]    CSN: [id number] Arrival date & time: 08/21/22  1500      History   Chief Complaint Chief Complaint  Patient presents with   Sore Throat   Cough   Headache    HPI April Mcmahon is a 62 y.o. female with a history of hypertension, hyperlipidemia, diabetes and hypothyroidism presents to urgent care today with complaint of headache, runny nose, sore throat and cough.  She reports this started 3 days ago.  She describes the headache as pressure.  She is blowing clear mucus out of her nose.  She denies difficulty swallowing.  The cough is mostly nonproductive.  She denies nasal congestion, ear pain, shortness of breath, nausea, vomiting or diarrhea.  She denies fever, chills or body aches.  She has tried Tylenol OTC with some relief of symptoms.  She has not had sick contacts that she is aware of.  HPI  Past Medical History:  Diagnosis Date   Bilateral chronic knee pain 06/2014   Chronic low back pain 06/2014   Chronic migraine 02/1985   Depression    hx of   Diabetes mellitus without complication (HCC) 11/2014   Fall    Hyperlipidemia    Hypertension 09/2013   Hypothyroidism    Shoulder pain     Patient Active Problem List   Diagnosis Date Noted   Contusion of right foot 10/30/2021   Viral URI with cough 10/30/2021   History of diabetes mellitus, type II 10/30/2021   History of hypertension 10/30/2021   Hyperglycemia 10/30/2021   Vitamin B12 deficiency 09/24/2021   DM type 2 with diabetic peripheral neuropathy (HCC) 06/15/2021   Dizziness 06/15/2021   Dyslipidemia 10/17/2020   Uncontrolled type 2 diabetes mellitus with peripheral neuropathy 10/17/2020   Hyperlipidemia associated with type 2 diabetes mellitus (HCC) 05/05/2020   Influenza vaccine needed 01/03/2020   Former smoker 01/03/2020   Acquired hypothyroidism 01/03/2020   Microalbuminuria 11/15/2018   Closed fracture of distal phalanx of great toe 08/23/2018   Muscle cramps  02/21/2018   Hepatic steatosis 12/23/2017   MCI (mild cognitive impairment) 02/14/2017   Diabetic peripheral neuropathy (HCC) 02/14/2017   Memory loss 12/27/2016   Dysfunctional uterine bleeding 08/30/2016   Analgesic rebound headache 06/24/2016   Esophageal dysphagia 06/21/2016   Tobacco dependence 01/20/2016   Plantar fasciitis, bilateral 12/24/2015   DJD (degenerative joint disease) of knee 06/20/2015   Type 2 diabetes mellitus with peripheral neuropathy (HCC) 06/19/2015   Essential hypertension 06/19/2015   Vitamin D deficiency 09/24/2013   Migraine 05/15/2013   Low back pain 08/18/2012    Past Surgical History:  Procedure Laterality Date   no previous surgeries      OB History   No obstetric history on file.      Home Medications    Prior to Admission medications   Medication Sig Start Date End Date Taking? Authorizing Provider  benzonatate (TESSALON) 100 MG capsule Take 1 capsule (100 mg total) by mouth every 8 (eight) hours. 08/21/22  Yes Lorre Munroe, NP  Accu-Chek Softclix Lancets lancets Check blood sugar three times daily. Dx E11.42 10/01/20   Marcine Matar, MD  Accu-Chek Softclix Lancets lancets Use as instructed to Check blood sugar three times daily 10/16/21   Shamleffer, Konrad Dolores, MD  amLODipine (NORVASC) 10 MG tablet Take 1 tablet (10 mg total) by mouth daily. 03/29/22   Marcine Matar, MD  atorvastatin (LIPITOR) 20 MG tablet Take 1 tablet (20 mg total)  by mouth daily. 03/29/22   Marcine Matar, MD  Blood Glucose Monitoring Suppl (ACCU-CHEK GUIDE ME) w/Device KIT Check blood sugar three times daily. Dx E11.42 10/01/20   Marcine Matar, MD  diclofenac Sodium (VOLTAREN) 1 % GEL Apply 2 g topically 4 (four) times daily. Patient not taking: Reported on 07/08/2022 01/03/20   Marcine Matar, MD  empagliflozin (JARDIANCE) 25 MG TABS tablet Take 1 tablet (25 mg total) by mouth daily before breakfast. 01/27/22   Shamleffer, Konrad Dolores, MD   fluticasone (FLONASE) 50 MCG/ACT nasal spray Place 2 sprays into both nostrils daily. Patient taking differently: Place 2 sprays into both nostrils daily as needed for allergies. 03/13/21   Claiborne Rigg, NP  gabapentin (NEURONTIN) 600 MG tablet Take 0.5 tablets (300 mg total) by mouth 3 (three) times daily. 03/13/21 01/27/22  Claiborne Rigg, NP  glucose blood (ACCU-CHEK GUIDE) test strip USE AS INSTRUCTED 06/14/22   Marcine Matar, MD  insulin glargine (LANTUS SOLOSTAR) 100 UNIT/ML Solostar Pen Inject 14 Units into the skin daily. 01/27/22   Shamleffer, Konrad Dolores, MD  Insulin Pen Needle 32G X 4 MM MISC 1 Device by Does not apply route daily in the afternoon. 01/27/22   Shamleffer, Konrad Dolores, MD  levothyroxine (SYNTHROID) 50 MCG tablet Take 1 tablet (50 mcg total) by mouth daily. 07/10/22   Marcine Matar, MD  loratadine (CLARITIN) 10 MG tablet Take 1 tablet (10 mg total) by mouth daily. Patient taking differently: Take 10 mg by mouth at bedtime as needed for allergies. 04/21/21   Marcine Matar, MD  Meloxicam 15 MG TBDP Take 15 mg by mouth daily. 02/02/22   Marcine Matar, MD  metFORMIN (GLUCOPHAGE) 1000 MG tablet Take 1 tablet (1,000 mg total) by mouth 2 (two) times daily with a meal. 01/27/22   Shamleffer, Konrad Dolores, MD  SUMAtriptan (IMITREX) 50 MG tablet Take 1 tablet (50 mg total) by mouth every 2 (two) hours as needed for migraine. Take 1 tab at start of headache; may repeat in 2 hrs if headache still present.  Max 2 tabs/24 hr. 02/02/22   Marcine Matar, MD  valsartan (DIOVAN) 40 MG tablet Take 0.5 tablets (20 mg total) by mouth daily. 07/08/22   Marcine Matar, MD  venlafaxine (EFFEXOR) 37.5 MG tablet Take 1 tablet (37.5 mg total) by mouth 2 (two) times daily with a meal. 02/09/17 12/14/18  Levert Feinstein, MD    Family History Family History  Problem Relation Age of Onset   Migraines Mother    Esophageal cancer Other    Breast cancer Neg Hx    Colon cancer  Neg Hx    Rectal cancer Neg Hx    Stomach cancer Neg Hx     Social History Social History   Tobacco Use   Smoking status: Never   Smokeless tobacco: Never  Vaping Use   Vaping status: Never Used  Substance Use Topics   Alcohol use: No   Drug use: No     Allergies   Cymbalta [duloxetine hcl] and Elavil [amitriptyline hcl]   Review of Systems Review of Systems  Constitutional:  Positive for fatigue. Negative for chills and fever.  HENT:  Positive for rhinorrhea and sore throat. Negative for congestion, ear pain, sinus pressure, sinus pain and trouble swallowing.   Respiratory:  Positive for cough. Negative for shortness of breath.   Cardiovascular:  Negative for chest pain.  Gastrointestinal:  Negative for diarrhea, nausea and vomiting.  Musculoskeletal:  Negative for arthralgias and myalgias.  Skin:  Negative for rash.  Neurological:  Positive for headaches. Negative for dizziness and weakness.     Physical Exam Triage Vital Signs ED Triage Vitals [08/21/22 1510]  Encounter Vitals Group     BP (!) 144/77     Systolic BP Percentile      Diastolic BP Percentile      Pulse Rate 71     Resp 18     Temp (!) 97.5 F (36.4 C)     Temp Source Oral     SpO2 95 %     Weight      Height      Head Circumference      Peak Flow      Pain Score 7     Pain Loc      Pain Education      Exclude from Growth Chart    No data found.  Updated Vital Signs BP (!) 144/77 (BP Location: Left Arm)   Pulse 71   Temp (!) 97.5 F (36.4 C) (Oral)   Resp 18   SpO2 95%       Physical Exam Constitutional:      General: She is not in acute distress.    Appearance: She is normal weight.  HENT:     Head: Normocephalic.     Comments: No sinus tenderness noted    Right Ear: Tympanic membrane and ear canal normal. No middle ear effusion.     Left Ear: Tympanic membrane and ear canal normal.  No middle ear effusion.     Nose: No congestion or rhinorrhea.     Mouth/Throat:      Mouth: Mucous membranes are dry.     Pharynx: No pharyngeal swelling or posterior oropharyngeal erythema.     Tonsils: No tonsillar exudate or tonsillar abscesses. 1+ on the right. 1+ on the left.  Eyes:     Conjunctiva/sclera: Conjunctivae normal.     Pupils: Pupils are equal, round, and reactive to light.  Cardiovascular:     Rate and Rhythm: Normal rate and regular rhythm.     Heart sounds: Normal heart sounds.  Pulmonary:     Effort: Pulmonary effort is normal.     Breath sounds: Normal breath sounds. No wheezing, rhonchi or rales.  Lymphadenopathy:     Cervical: No cervical adenopathy.  Skin:    General: Skin is warm and dry.     Findings: No rash.  Neurological:     General: No focal deficit present.     Mental Status: She is alert and oriented to person, place, and time.      UC Treatments / Results  Labs Labs Reviewed  SARS CORONAVIRUS 2 (TAT 6-24 HRS)  POCT RAPID STREP A (OFFICE)    EKG   Radiology No results found.  Procedures Procedures (including critical care time)  Medications Ordered in UC Medications - No data to display  Initial Impression / Assessment and Plan / UC Course  I have reviewed the triage vital signs and the nursing notes.  Pertinent labs & imaging results that were available during my care of the patient were reviewed by me and considered in my medical decision making (see chart for details).    62 year old female with 3-day history of URI symptoms.  DDx include viral URI with cough, allergic rhinitis, COVID, viral pharyngitis, bacterial pharyngitis.  Rapid strep negative.  COVID swab obtained-she will be called with any positive results and  recommended treatment plan.  Encouraged rest and fluids.  Okay to continue Tylenol or ibuprofen OTC as he did.  Rx for Tessalon 100 mg 3 times daily as needed for cough.  Advised her to follow-up with her PCP if symptoms persist or worsen.  Final Clinical Impressions(s) / UC Diagnoses   Final  diagnoses:  Acute cough  Acute intractable headache, unspecified headache type  Sore throat     Discharge Instructions      You were seen today for respiratory symptoms.  Your rapid strep was negative.  We have sent a COVID swab and you will be called with any positive results and recommended treatment plan.  I encourage rest and fluids.  It is okay to take Tylenol and ibuprofen OTC as needed.  I sent in a cough suppressant that you can take every 8 hours as needed.  Please follow-up with your PCP if symptoms persist or worsen.     ED Prescriptions     Medication Sig Dispense Auth. Provider   benzonatate (TESSALON) 100 MG capsule Take 1 capsule (100 mg total) by mouth every 8 (eight) hours. 21 capsule Lorre Munroe, NP      PDMP not reviewed this encounter.   Lorre Munroe, NP 08/21/22 [phone]

## 2022-08-21 NOTE — ED Triage Notes (Signed)
Patient with c/o headache, congestion, cough, sore throat. Patient denies coughing up any phlegm. Patient has taken tylenol for headache.

## 2022-08-21 NOTE — Discharge Instructions (Addendum)
You were seen today for respiratory symptoms.  Your rapid strep was negative.  We have sent a COVID swab and you will be called with any positive results and recommended treatment plan.  I encourage rest and fluids.  It is okay to take Tylenol and ibuprofen OTC as needed.  I sent in a cough suppressant that you can take every 8 hours as needed.  Please follow-up with your PCP if symptoms persist or worsen.

## 2022-08-22 LAB — SARS CORONAVIRUS 2 (TAT 6-24 HRS): SARS Coronavirus 2: NEGATIVE

## 2022-08-31 ENCOUNTER — Ambulatory Visit: Payer: Medicaid Other | Admitting: Orthopaedic Surgery

## 2022-10-05 ENCOUNTER — Encounter: Payer: Self-pay | Admitting: Orthopaedic Surgery

## 2022-10-05 ENCOUNTER — Ambulatory Visit (INDEPENDENT_AMBULATORY_CARE_PROVIDER_SITE_OTHER): Payer: Medicaid Other | Admitting: Orthopaedic Surgery

## 2022-10-05 DIAGNOSIS — M25532 Pain in left wrist: Secondary | ICD-10-CM

## 2022-10-05 DIAGNOSIS — M25531 Pain in right wrist: Secondary | ICD-10-CM | POA: Diagnosis not present

## 2022-10-05 MED ORDER — MELOXICAM 7.5 MG PO TABS: 7.5000 mg | ORAL_TABLET | Freq: Every day | ORAL | 2 refills | Status: DC | PRN

## 2022-10-05 NOTE — Progress Notes (Signed)
Office Visit Note   Patient: April Mcmahon           Date of Birth: [birth year]           MRN: [id number] Visit Date: 10/05/2022              Requested by: Marcine Matar, MD [address] [place] [address] [place],  [place] [address] PCP: Marcine Matar, MD   Assessment & Plan: Visit Diagnoses:  1. Bilateral wrist pain     Plan: Impression is bilateral ulnar-sided wrist pain.  At this point, I recommended oral and topical anti-inflammatories as well as removable wrist splinting.  She has the option of obtaining Velcro splints from our office or getting [facility] splints over-the-counter.  She will follow-up with [name] as needed.  Call with concerns or questions.  Follow-Up Instructions: Return if symptoms worsen or fail to improve.   Orders:  No orders of the defined types were placed in this encounter.  Meds ordered this encounter  Medications   meloxicam (MOBIC) 7.5 MG tablet    Sig: Take 1 tablet (7.5 mg total) by mouth daily as needed for pain.    Dispense:  30 tablet    Refill:  2      Procedures: No procedures performed   Clinical Data: No additional findings.   Subjective: Chief Complaint  Patient presents with   Right Wrist - Pain   Left Wrist - Pain    HPI patient is a pleasant 62 year old left-hand-dominant female with Nepali interpreter.  She is left-handed.  She is here with bilateral wrist pain right greater than left for the past 10 days.  She denies any injury or change in activity.  All of her pain is to the ulnar side.  Worse with wrist flexion, extension and ulnar deviation.  She does take Tylenol which significantly helps.  Review of Systems as detailed in HPI.  All others reviewed and are negative.   Objective: Vital Signs: There were no vitals taken for this visit.  Physical Exam well-developed well-nourished female no acute distress.  Alert and oriented x 3.  Ortho Exam bilateral wrist exam: Moderate tenderness over the ulnar styloid.  She  has increased pain with wrist flexion, extension and ulnar deviation.  No tenderness to the forearm.  She is neurovascular intact distally.  Specialty Comments:  No specialty comments available.  Imaging: No new imaging   PMFS History: Patient Active Problem List   Diagnosis Date Noted   Contusion of right foot 10/30/2021   Viral URI with cough 10/30/2021   History of diabetes mellitus, type II 10/30/2021   History of hypertension 10/30/2021   Hyperglycemia 10/30/2021   Vitamin B12 deficiency 09/24/2021   DM type 2 with diabetic peripheral neuropathy (HCC) 06/15/2021   Dizziness 06/15/2021   Dyslipidemia 10/17/2020   Uncontrolled type 2 diabetes mellitus with peripheral neuropathy 10/17/2020   Hyperlipidemia associated with type 2 diabetes mellitus (HCC) 05/05/2020   Influenza vaccine needed 01/03/2020   Former smoker 01/03/2020   Acquired hypothyroidism 01/03/2020   Microalbuminuria 11/15/2018   Closed fracture of distal phalanx of great toe 08/23/2018   Muscle cramps 02/21/2018   Hepatic steatosis 12/23/2017   MCI (mild cognitive impairment) 02/14/2017   Diabetic peripheral neuropathy (HCC) 02/14/2017   Memory loss 12/27/2016   Dysfunctional uterine bleeding 08/30/2016   Analgesic rebound headache 06/24/2016   Esophageal dysphagia 06/21/2016   Tobacco dependence 01/20/2016   Plantar fasciitis, bilateral 12/24/2015   DJD (degenerative joint  disease) of knee 06/20/2015   Type 2 diabetes mellitus with peripheral neuropathy (HCC) 06/19/2015   Essential hypertension 06/19/2015   Vitamin D deficiency 09/24/2013   Migraine 05/15/2013   Low back pain 08/18/2012   Past Medical History:  Diagnosis Date   Bilateral chronic knee pain 06/2014   Chronic low back pain 06/2014   Chronic migraine 02/1985   Depression    hx of   Diabetes mellitus without complication (HCC) 11/2014   Fall    Hyperlipidemia    Hypertension 09/2013   Hypothyroidism    Shoulder pain     Family  History  Problem Relation Age of Onset   Migraines Mother    Esophageal cancer Other    Breast cancer Neg Hx    Colon cancer Neg Hx    Rectal cancer Neg Hx    Stomach cancer Neg Hx     Past Surgical History:  Procedure Laterality Date   no previous surgeries     Social History   Occupational History   Not on file  Tobacco Use   Smoking status: Never   Smokeless tobacco: Never  Vaping Use   Vaping status: Never Used  Substance and Sexual Activity   Alcohol use: No   Drug use: No   Sexual activity: Not on file

## 2022-11-08 ENCOUNTER — Encounter: Payer: Self-pay | Admitting: Internal Medicine

## 2022-11-08 ENCOUNTER — Ambulatory Visit: Payer: Medicaid Other | Attending: Internal Medicine | Admitting: Internal Medicine

## 2022-11-08 VITALS — BP 129/78 | HR 77 | Temp 98.1°F | Ht 60.0 in | Wt 147.0 lb

## 2022-11-08 DIAGNOSIS — E1159 Type 2 diabetes mellitus with other circulatory complications: Secondary | ICD-10-CM | POA: Diagnosis not present

## 2022-11-08 DIAGNOSIS — Z79890 Hormone replacement therapy (postmenopausal): Secondary | ICD-10-CM | POA: Diagnosis not present

## 2022-11-08 DIAGNOSIS — M170 Bilateral primary osteoarthritis of knee: Secondary | ICD-10-CM | POA: Diagnosis not present

## 2022-11-08 DIAGNOSIS — Z23 Encounter for immunization: Secondary | ICD-10-CM | POA: Diagnosis not present

## 2022-11-08 DIAGNOSIS — Z794 Long term (current) use of insulin: Secondary | ICD-10-CM | POA: Insufficient documentation

## 2022-11-08 DIAGNOSIS — Z79899 Other long term (current) drug therapy: Secondary | ICD-10-CM | POA: Insufficient documentation

## 2022-11-08 DIAGNOSIS — E559 Vitamin D deficiency, unspecified: Secondary | ICD-10-CM | POA: Insufficient documentation

## 2022-11-08 DIAGNOSIS — K760 Fatty (change of) liver, not elsewhere classified: Secondary | ICD-10-CM | POA: Diagnosis not present

## 2022-11-08 DIAGNOSIS — Z7984 Long term (current) use of oral hypoglycemic drugs: Secondary | ICD-10-CM | POA: Insufficient documentation

## 2022-11-08 DIAGNOSIS — M722 Plantar fascial fibromatosis: Secondary | ICD-10-CM | POA: Insufficient documentation

## 2022-11-08 DIAGNOSIS — E119 Type 2 diabetes mellitus without complications: Secondary | ICD-10-CM

## 2022-11-08 DIAGNOSIS — M4802 Spinal stenosis, cervical region: Secondary | ICD-10-CM | POA: Diagnosis not present

## 2022-11-08 DIAGNOSIS — E039 Hypothyroidism, unspecified: Secondary | ICD-10-CM | POA: Insufficient documentation

## 2022-11-08 DIAGNOSIS — E1142 Type 2 diabetes mellitus with diabetic polyneuropathy: Secondary | ICD-10-CM | POA: Insufficient documentation

## 2022-11-08 DIAGNOSIS — I10 Essential (primary) hypertension: Secondary | ICD-10-CM | POA: Insufficient documentation

## 2022-11-08 DIAGNOSIS — G43909 Migraine, unspecified, not intractable, without status migrainosus: Secondary | ICD-10-CM | POA: Insufficient documentation

## 2022-11-08 DIAGNOSIS — L853 Xerosis cutis: Secondary | ICD-10-CM

## 2022-11-08 DIAGNOSIS — E1169 Type 2 diabetes mellitus with other specified complication: Secondary | ICD-10-CM

## 2022-11-08 DIAGNOSIS — E785 Hyperlipidemia, unspecified: Secondary | ICD-10-CM | POA: Diagnosis not present

## 2022-11-08 DIAGNOSIS — I152 Hypertension secondary to endocrine disorders: Secondary | ICD-10-CM

## 2022-11-08 DIAGNOSIS — Z87891 Personal history of nicotine dependence: Secondary | ICD-10-CM | POA: Insufficient documentation

## 2022-11-08 DIAGNOSIS — Z760 Encounter for issue of repeat prescription: Secondary | ICD-10-CM | POA: Diagnosis not present

## 2022-11-08 LAB — POCT GLYCOSYLATED HEMOGLOBIN (HGB A1C): HbA1c, POC (controlled diabetic range): 7.3 % — AB (ref 0.0–7.0)

## 2022-11-08 LAB — GLUCOSE, POCT (MANUAL RESULT ENTRY): POC Glucose: 181 mg/dL — AB (ref 70–99)

## 2022-11-08 MED ORDER — ACCU-CHEK GUIDE VI STRP
ORAL_STRIP | 11 refills | Status: DC
Start: 2022-11-08 — End: 2023-12-15

## 2022-11-08 MED ORDER — ACCU-CHEK SOFTCLIX LANCETS MISC
12 refills | Status: DC
Start: 2022-11-08 — End: 2023-12-15
  Filled 2023-07-27: qty 100, 33d supply, fill #0

## 2022-11-08 MED ORDER — AMLODIPINE BESYLATE 10 MG PO TABS
10.0000 mg | ORAL_TABLET | Freq: Every day | ORAL | 1 refills | Status: DC
Start: 2022-11-08 — End: 2023-03-14

## 2022-11-08 MED ORDER — LANTUS SOLOSTAR 100 UNIT/ML ~~LOC~~ SOPN
14.0000 [IU] | PEN_INJECTOR | Freq: Every day | SUBCUTANEOUS | 3 refills | Status: DC
Start: 2022-11-08 — End: 2023-02-10

## 2022-11-08 MED ORDER — TRIAMCINOLONE ACETONIDE 0.025 % EX OINT: 1.0000 | TOPICAL_OINTMENT | Freq: Two times a day (BID) | CUTANEOUS | 0 refills | Status: DC

## 2022-11-08 NOTE — Progress Notes (Signed)
Patient ID: April Mcmahon, female    DOB: 1960-05-29  MRN: [id number]  CC: Diabetes (DM f/u. Med refill. Garth Schlatter dryness of both hands, intermittent bleeding due to dryness X6-7 yrs/Yes to flu vax. )   Subjective: April Mcmahon is a 62 y.o. female who presents for chronic ds management.    Daughter-in-law, Luci Bank. Is with her  Her concerns today include:  Pt with hx of DM neuropathy, HTN, HL, former tob dep, migraines, Vit D def, DJD knees and plantar fasciitis, Abn LFT with steatosis on U/S, BL CTS, hypothyroid, spinal stenosis cervical spine   [employer] Language interpreter used during this encounter. #[id number], [place]  Pt c/o dryness of finger pads both hands for yrs, intermittent bleeding due to dryness.  Was given some ointment in the past and uses lotion but reports no improvement. Noted to have some coloring on fingers today; reports she used some coloring due to ritual done this past wkend.  Does not use harsh cleaning chemicals at home.  Uses gentle soap.   DM: Results for orders placed or performed in visit on 11/08/22  POCT glycosylated hemoglobin (Hb A1C)  Result Value Ref Range   Hemoglobin A1C     HbA1c POC (<> result, manual entry)     HbA1c, POC (prediabetic range)     HbA1c, POC (controlled diabetic range) 7.3 (A) 0.0 - 7.0 %  POCT glucose (manual entry)  Result Value Ref Range   POC Glucose 181 (A) 70 - 99 mg/dl  Z5G on last visit was 7.1. Compliant with Lantus 14 units, Metformin 1 gram BID and Jardiance 25 mg daily.  Has not checked BS in 1 mth.  Ran out of test stripes and lancets.  Up to that point she was checking 2-3x/day. BS were running 120-180.  Lowest was 90  Does well with eating habits; has cut back on portions of rice but still eats rice daily.   Walks a little daily Reports compliance with atorvastatin 20 mg daily.  Last LDL was 49.  In regards to her blood pressure.  She reports compliance with Diovan 40 mg half a tablet daily and Norvasc 10 mg  daily. Checks BP once a day.  Reports range 118-125/does not recall DBP #s No CP/SOB/LE edema  Thyroid: Taking levothyroxine 50 mcg as prescribed.  Ran out yesterday but has already requested RF from pharmacy and will pick up today.  Most recent TSH was 2.1 in June of this year.  HM:  yes to flu vaccine  Patient Active Problem List   Diagnosis Date Noted   Contusion of right foot 10/30/2021   Viral URI with cough 10/30/2021   History of diabetes mellitus, type II 10/30/2021   History of hypertension 10/30/2021   Hyperglycemia 10/30/2021   Vitamin B12 deficiency 09/24/2021   DM type 2 with diabetic peripheral neuropathy (HCC) 06/15/2021   Dizziness 06/15/2021   Dyslipidemia 10/17/2020   Uncontrolled type 2 diabetes mellitus with peripheral neuropathy 10/17/2020   Hyperlipidemia associated with type 2 diabetes mellitus (HCC) 05/05/2020   Influenza vaccine needed 01/03/2020   Former smoker 01/03/2020   Acquired hypothyroidism 01/03/2020   Microalbuminuria 11/15/2018   Closed fracture of distal phalanx of great toe 08/23/2018   Muscle cramps 02/21/2018   Hepatic steatosis 12/23/2017   MCI (mild cognitive impairment) 02/14/2017   Diabetic peripheral neuropathy (HCC) 02/14/2017   Memory loss 12/27/2016   Dysfunctional uterine bleeding 08/30/2016   Analgesic rebound headache 06/24/2016   Esophageal dysphagia 06/21/2016  Tobacco dependence 01/20/2016   Plantar fasciitis, bilateral 12/24/2015   DJD (degenerative joint disease) of knee 06/20/2015   Type 2 diabetes mellitus with peripheral neuropathy (HCC) 06/19/2015   Essential hypertension 06/19/2015   Vitamin D deficiency 09/24/2013   Migraine 05/15/2013   Low back pain 08/18/2012     Current Outpatient Medications on File Prior to Visit  Medication Sig Dispense Refill   atorvastatin (LIPITOR) 20 MG tablet Take 1 tablet (20 mg total) by mouth daily. 90 tablet 3   Blood Glucose Monitoring Suppl (ACCU-CHEK GUIDE ME) w/Device KIT  Check blood sugar three times daily. Dx E11.42 1 kit 0   empagliflozin (JARDIANCE) 25 MG TABS tablet Take 1 tablet (25 mg total) by mouth daily before breakfast. 90 tablet 3   Insulin Pen Needle 32G X 4 MM MISC 1 Device by Does not apply route daily in the afternoon. 100 each 3   levothyroxine (SYNTHROID) 50 MCG tablet Take 1 tablet (50 mcg total) by mouth daily. 90 tablet 1   loratadine (CLARITIN) 10 MG tablet Take 1 tablet (10 mg total) by mouth daily. (Patient taking differently: Take 10 mg by mouth at bedtime as needed for allergies.) 30 tablet 3   meloxicam (MOBIC) 7.5 MG tablet Take 1 tablet (7.5 mg total) by mouth daily as needed for pain. 30 tablet 2   Meloxicam 15 MG TBDP Take 15 mg by mouth daily. 30 tablet 2   metFORMIN (GLUCOPHAGE) 1000 MG tablet Take 1 tablet (1,000 mg total) by mouth 2 (two) times daily with a meal. 180 tablet 3   SUMAtriptan (IMITREX) 50 MG tablet Take 1 tablet (50 mg total) by mouth every 2 (two) hours as needed for migraine. Take 1 tab at start of headache; may repeat in 2 hrs if headache still present.  Max 2 tabs/24 hr. 10 tablet 2   valsartan (DIOVAN) 40 MG tablet Take 0.5 tablets (20 mg total) by mouth daily. 90 tablet 1   fluticasone (FLONASE) 50 MCG/ACT nasal spray Place 2 sprays into both nostrils daily. (Patient not taking: Reported on 11/08/2022) 16 g 0   gabapentin (NEURONTIN) 600 MG tablet Take 0.5 tablets (300 mg total) by mouth 3 (three) times daily. 45 tablet 1   [DISCONTINUED] venlafaxine (EFFEXOR) 37.5 MG tablet Take 1 tablet (37.5 mg total) by mouth 2 (two) times daily with a meal. 60 tablet 11   No current facility-administered medications on file prior to visit.    Allergies  Allergen Reactions   Cymbalta [Duloxetine Hcl]     Nausea and dizziness   Elavil [Amitriptyline Hcl] Diarrhea and Nausea And Vomiting    Social History   Socioeconomic History   Marital status: Married    Spouse name: Not on file   Number of children: Not on file    Years of education: Not on file   Highest education level: Not on file  Occupational History   Not on file  Tobacco Use   Smoking status: Never   Smokeless tobacco: Never  Vaping Use   Vaping status: Never Used  Substance and Sexual Activity   Alcohol use: No   Drug use: No   Sexual activity: Not on file  Other Topics Concern   Not on file  Social History Narrative   Not on file   Social Determinants of Health   Financial Resource Strain: Not on file  Food Insecurity: Not on file  Transportation Needs: Not on file  Physical Activity: Not on file  Stress: Not  on file  Social Connections: Not on file  Intimate Partner Violence: Not on file    Family History  Problem Relation Age of Onset   Migraines Mother    Esophageal cancer Other    Breast cancer Neg Hx    Colon cancer Neg Hx    Rectal cancer Neg Hx    Stomach cancer Neg Hx     Past Surgical History:  Procedure Laterality Date   no previous surgeries      ROS: Review of Systems Negative except as stated above  PHYSICAL EXAM: BP 129/78 (BP Location: Left Arm, Patient Position: Sitting, Cuff Size: Normal)   Pulse 77   Temp 98.1 F (36.7 C) (Oral)   Ht 5' (1.524 m)   Wt 147 lb (66.7 kg)   SpO2 96%   BMI 28.71 kg/m   Wt Readings from Last 3 Encounters:  11/08/22 147 lb (66.7 kg)  08/19/22 150 lb (68 kg)  07/08/22 149 lb 12.8 oz (67.9 kg)    Physical Exam  General appearance - alert, well appearing, and in no distress Mental status - normal mood, behavior, speech, dress, motor activity, and thought processes Neck - supple, no significant adenopathy Chest - clear to auscultation, no wheezes, rales or rhonchi, symmetric air entry Heart - normal rate, regular rhythm, normal S1, S2, no murmurs, rubs, clicks or gallops Extremities - peripheral pulses normal, no pedal edema, no clubbing or cyanosis Skin -patient with dry cracked skin of the finger pads.  See picture below of the right  hand.   Diabetic Foot Exam - Simple   Simple Foot Form Diabetic Foot exam was performed with the following findings: Yes 11/08/2022  2:43 PM  Visual Inspection No deformities, no ulcerations, no other skin breakdown bilaterally: Yes Sensation Testing Intact to touch and monofilament testing bilaterally: Yes Pulse Check Posterior Tibialis and Dorsalis pulse intact bilaterally: Yes Comments         Latest Ref Rng & Units 02/02/2022    2:51 PM 07/08/2021    2:13 PM 12/22/2020   10:14 AM  CMP  Glucose 70 - 99 mg/dL 644  034  90   BUN 8 - 27 mg/dL 17  10  10    Creatinine 0.57 - 1.00 mg/dL 7.42  5.95  6.38   Sodium 134 - 144 mmol/L 145  140  144   Potassium 3.5 - 5.2 mmol/L 4.2  3.9  4.5   Chloride 96 - 106 mmol/L 108  111  106   CO2 20 - 29 mmol/L 22  23  22    Calcium 8.7 - 10.3 mg/dL 9.4  9.3  9.6   Total Protein 6.0 - 8.5 g/dL 7.5   7.7   Total Bilirubin 0.0 - 1.2 mg/dL 0.3   0.5   Alkaline Phos 44 - 121 IU/L 119   126   AST 0 - 40 IU/L 18   27   ALT 0 - 32 IU/L 18   32    Lipid Panel     Component Value Date/Time   CHOL 144 02/02/2022 1451   TRIG 262 (H) 02/02/2022 1451   HDL 55 02/02/2022 1451   CHOLHDL 2.6 02/02/2022 1451   CHOLHDL 3.3 10/20/2015 1625   VLDL 46 (H) 10/20/2015 1625   LDLCALC 49 02/02/2022 1451    CBC    Component Value Date/Time   WBC 7.8 02/02/2022 1451   WBC 9.7 10/10/2017 1941   RBC 4.80 02/02/2022 1451   RBC 4.81 10/10/2017 1941  HGB 12.7 02/02/2022 1451   HCT 38.6 02/02/2022 1451   PLT 259 02/02/2022 1451   MCV 80 02/02/2022 1451   MCH 26.5 (L) 02/02/2022 1451   MCH 26.4 10/10/2017 1941   MCHC 32.9 02/02/2022 1451   MCHC 31.0 10/10/2017 1941   RDW 14.8 02/02/2022 1451   LYMPHSABS 3.2 (H) 06/15/2021 1615   MONOABS 335 10/20/2015 1625   EOSABS 0.2 06/15/2021 1615   BASOSABS 0.0 06/15/2021 1615    ASSESSMENT AND PLAN: 1. Type 2 diabetes mellitus with peripheral neuropathy (HCC) A1c close to goal.  Patient has not checked blood  sugars in over a month due to running out of supplies.  Will have her continue current dose of Lantus insulin 14 units, metformin 1 g twice a day and Jardiance 25 mg daily.  Advised to bring blood sugar readings with her on subsequent visit.  Continue trying to cut back on the white carbohydrates.  Continue daily exercise. - POCT glycosylated hemoglobin (Hb A1C) - POCT glucose (manual entry) - insulin glargine (LANTUS SOLOSTAR) 100 UNIT/ML Solostar Pen; Inject 14 Units into the skin daily.  Dispense: 15 mL; Refill: 3 - Microalbumin / creatinine urine ratio - glucose blood (ACCU-CHEK GUIDE) test strip; Check BS three times a day before meals.  E11.42  Dispense: 100 strip; Refill: 11 - Accu-Chek Softclix Lancets lancets; Use as instructed to Check blood sugar three times daily.  E11.42  Dispense: 100 each; Refill: 12  2. Diabetes mellitus treated with oral medication (HCC) See #1 above. - insulin glargine (LANTUS SOLOSTAR) 100 UNIT/ML Solostar Pen; Inject 14 Units into the skin daily.  Dispense: 15 mL; Refill: 3  3. Insulin long-term use (HCC) See #1 above. - insulin glargine (LANTUS SOLOSTAR) 100 UNIT/ML Solostar Pen; Inject 14 Units into the skin daily.  Dispense: 15 mL; Refill: 3  4. Hypertension associated with type 2 diabetes mellitus (HCC) At goal.  Continue amlodipine 10 mg daily and Diovan 20 mg daily. - amLODipine (NORVASC) 10 MG tablet; Take 1 tablet (10 mg total) by mouth daily.  Dispense: 90 tablet; Refill: 1  5. Hyperlipidemia associated with type 2 diabetes mellitus (HCC) Continue atorvastatin.  Last LDL was at goal.  6. Dry skin - Ambulatory referral to Dermatology - triamcinolone (KENALOG) 0.025 % ointment; Apply 1 Application topically 2 (two) times daily.  Dispense: 30 g; Refill: 0  7. Acquired hypothyroidism Continue levothyroxine 50 mcg daily.  8. Need for influenza vaccination Given today.    Patient was given the opportunity to ask questions.  Patient verbalized  understanding of the plan and was able to repeat key elements of the plan.   This documentation was completed using Paediatric nurse.  Any transcriptional errors are unintentional.  Orders Placed This Encounter  Procedures   Microalbumin / creatinine urine ratio   Ambulatory referral to Dermatology   POCT glycosylated hemoglobin (Hb A1C)   POCT glucose (manual entry)     Requested Prescriptions   Signed Prescriptions Disp Refills   amLODipine (NORVASC) 10 MG tablet 90 tablet 1    Sig: Take 1 tablet (10 mg total) by mouth daily.   insulin glargine (LANTUS SOLOSTAR) 100 UNIT/ML Solostar Pen 15 mL 3    Sig: Inject 14 Units into the skin daily.   triamcinolone (KENALOG) 0.025 % ointment 30 g 0    Sig: Apply 1 Application topically 2 (two) times daily.   glucose blood (ACCU-CHEK GUIDE) test strip 100 strip 11    Sig: Check BS three  times a day before meals.  E11.42   Accu-Chek Softclix Lancets lancets 100 each 12    Sig: Use as instructed to Check blood sugar three times daily.  E11.42    Return in about 4 months (around 03/11/2023) for chronic ds management.  Jonah Blue, MD, FACP

## 2022-11-09 LAB — MICROALBUMIN / CREATININE URINE RATIO
Creatinine, Urine: 39.8 mg/dL
Microalb/Creat Ratio: 26 mg/g{creat} (ref 0–29)
Microalbumin, Urine: 10.3 ug/mL

## 2022-11-16 ENCOUNTER — Other Ambulatory Visit: Payer: Self-pay | Admitting: Internal Medicine

## 2022-11-25 ENCOUNTER — Encounter: Payer: Self-pay | Admitting: Emergency Medicine

## 2022-11-25 ENCOUNTER — Ambulatory Visit
Admission: EM | Admit: 2022-11-25 | Discharge: 2022-11-25 | Disposition: A | Discharge status: Home / Self Care | Payer: Medicaid Other | Attending: Family Medicine | Admitting: Family Medicine

## 2022-11-25 DIAGNOSIS — R52 Pain, unspecified: Secondary | ICD-10-CM | POA: Diagnosis present

## 2022-11-25 DIAGNOSIS — R82998 Other abnormal findings in urine: Secondary | ICD-10-CM | POA: Insufficient documentation

## 2022-11-25 DIAGNOSIS — R519 Headache, unspecified: Secondary | ICD-10-CM | POA: Diagnosis not present

## 2022-11-25 LAB — POCT URINALYSIS DIP (MANUAL ENTRY)
Bilirubin, UA: NEGATIVE
Blood, UA: NEGATIVE
Glucose, UA: 500 mg/dL — AB
Ketones, POC UA: NEGATIVE mg/dL
Nitrite, UA: NEGATIVE
Protein Ur, POC: NEGATIVE mg/dL
Spec Grav, UA: 1.015 (ref 1.010–1.025)
Urobilinogen, UA: 0.2 U/dL
pH, UA: 5.5 (ref 5.0–8.0)

## 2022-11-25 MED ORDER — KETOROLAC TROMETHAMINE 30 MG/ML IJ SOLN
30.0000 mg | Freq: Once | INTRAMUSCULAR | Status: AC
  Administered 2022-11-25: 30 mg via INTRAMUSCULAR

## 2022-11-25 NOTE — ED Provider Notes (Signed)
[facility]   [id number] 11/25/22 Arrival Time: 1547  ASSESSMENT & PLAN:  1. Body aches   2. Acute intractable headache, unspecified headache type   3. Urine leukocytes    Meds ordered this encounter  Medications   ketorolac (TORADOL) 30 MG/ML injection 30 mg   Normal neurological exam. Feeling much better after Toradol. Ques viral illness. Afebrile without nuchal rigidity. Without fever, focal neuro logical deficits, nuchal rigidity, or change in vision. No indication for neurodiagnostic workup at this time. Discussed. She is comfortable with home observation this evening. Ensure adequate hydration.  Urine culture sent.  Recommend:  Follow-up Information     Marcine Matar, MD.   Specialty: Internal Medicine Why: As needed. Contact information: [address] [place] [address] [place] [address] [phone]         [facility]).   Specialty: Urgent Care Why: If worsening or failing to improve as anticipated. Contact information: [address] [place] [address] [phone]                 Reviewed expectations re: course of current medical issues. Questions answered. Outlined signs and symptoms indicating need for more acute intervention. Patient verbalized understanding. After Visit Summary given.   SUBJECTIVE: History from: Patient. Daughter is interpreting. Declines formal interpreter. Patient is able to give a clear and coherent history.  April Mcmahon is a 62 y.o. female who presents with complaint of a body aches and headache. Gradual onset; past couple of days. Tylenol with mild help. Denies resp symptoms. Slightly decreased PO intake without n/v/d. Denies urine symptoms. Denies known sick contacts. Denies fever.  OBJECTIVE:  Vitals:   11/25/22 1645  BP: (!) 151/76  Pulse: 71  Temp: 97.7 F (36.5 C)  TempSrc: Oral  SpO2: 96%    General appearance:  alert; NAD but appears uncomfortable HENT: normocephalic; atraumatic Eyes: PERRLA; EOMI; conjunctivae normal Neck: supple with FROM Lungs: unlabored respirations Heart: regular Extremities: no edema; symmetrical with no gross deformities Skin: warm and dry Neurologic: alert; speech is fluent and clear without dysarthria or aphasia; CN 2-12 grossly intact; no facial droop; normal gait; normal symmetric reflexes; normal extremity strength and sensation throughout; bilateral upper and lower extremity sensation is grossly intact with 5/5 symmetric strength; normal grip strength Psychological: alert and cooperative; normal mood and affect  Labs: Results for orders placed or performed during the hospital encounter of 11/25/22  POCT urinalysis dipstick  Result Value Ref Range   Color, UA yellow yellow   Clarity, UA clear clear   Glucose, UA =500 (A) negative mg/dL   Bilirubin, UA negative negative   Ketones, POC UA negative negative mg/dL   Spec Grav, UA 4.742 5.956 - 1.025   Blood, UA negative negative   pH, UA 5.5 5.0 - 8.0   Protein Ur, POC negative negative mg/dL   Urobilinogen, UA 0.2 0.2 or 1.0 E.U./dL   Nitrite, UA Negative Negative   Leukocytes, UA Small (1+) (A) Negative   Labs Reviewed  POCT URINALYSIS DIP (MANUAL ENTRY) - Abnormal; Notable for the following components:      Result Value   Glucose, UA =500 (*)    Leukocytes, UA Small (1+) (*)    All other components within normal limits    Imaging: No results found.  Allergies  Allergen Reactions   Cymbalta [Duloxetine Hcl]     Nausea and dizziness   Elavil [Amitriptyline Hcl] Diarrhea and Nausea  And Vomiting    Past Medical History:  Diagnosis Date   Bilateral chronic knee pain 06/2014   Chronic low back pain 06/2014   Chronic migraine 02/1985   Depression    hx of   Diabetes mellitus without complication (HCC) 11/2014   Fall    Hyperlipidemia    Hypertension 09/2013   Hypothyroidism    Shoulder pain     Social History   Socioeconomic History   Marital status: Married    Spouse name: Not on file   Number of children: Not on file   Years of education: Not on file   Highest education level: Not on file  Occupational History   Not on file  Tobacco Use   Smoking status: Never   Smokeless tobacco: Never  Vaping Use   Vaping status: Never Used  Substance and Sexual Activity   Alcohol use: No   Drug use: No   Sexual activity: Not on file  Other Topics Concern   Not on file  Social History Narrative   Not on file   Social Determinants of Health   Financial Resource Strain: Not on file  Food Insecurity: Food Insecurity Present (11/08/2022)   Hunger Vital Sign    Worried About Running Out of Food in the Last Year: Often true    Ran Out of Food in the Last Year: Often true  Transportation Needs: Unmet Transportation Needs (11/08/2022)   PRAPARE - Administrator, Civil Service (Medical): Yes    Lack of Transportation (Non-Medical): No  Physical Activity: Not on file  Stress: Not on file  Social Connections: Not on file  Intimate Partner Violence: Not At Risk (11/08/2022)   Humiliation, Afraid, Rape, and Kick questionnaire    Fear of Current or Ex-Partner: No    Emotionally Abused: No    Physically Abused: No    Sexually Abused: No   Family History  Problem Relation Age of Onset   Migraines Mother    Esophageal cancer Other    Breast cancer Neg Hx    Colon cancer Neg Hx    Rectal cancer Neg Hx    Stomach cancer Neg Hx    Past Surgical History:  Procedure Laterality Date   no previous surgeries        Mardella Layman, MD 11/25/22 [phone]

## 2022-11-25 NOTE — ED Triage Notes (Signed)
Patient c/o body aches and headache x 3 days.  Denies any other sx's.  Patient has been taken Tylenol.  Denies any nausea, vomiting or diarrhea.

## 2022-11-25 NOTE — Discharge Instructions (Signed)
Meds ordered this encounter  Medications   ketorolac (TORADOL) 30 MG/ML injection 30 mg    

## 2022-11-27 LAB — URINE CULTURE

## 2023-02-01 ENCOUNTER — Other Ambulatory Visit: Payer: Self-pay | Admitting: Internal Medicine

## 2023-02-10 ENCOUNTER — Ambulatory Visit (INDEPENDENT_AMBULATORY_CARE_PROVIDER_SITE_OTHER): Payer: Medicaid Other | Admitting: Internal Medicine

## 2023-02-10 ENCOUNTER — Encounter: Payer: Self-pay | Admitting: Internal Medicine

## 2023-02-10 VITALS — BP 122/76 | HR 61 | Ht 60.0 in | Wt 149.0 lb

## 2023-02-10 DIAGNOSIS — Z794 Long term (current) use of insulin: Secondary | ICD-10-CM | POA: Diagnosis not present

## 2023-02-10 DIAGNOSIS — E1142 Type 2 diabetes mellitus with diabetic polyneuropathy: Secondary | ICD-10-CM

## 2023-02-10 DIAGNOSIS — Z7984 Long term (current) use of oral hypoglycemic drugs: Secondary | ICD-10-CM

## 2023-02-10 DIAGNOSIS — E119 Type 2 diabetes mellitus without complications: Secondary | ICD-10-CM | POA: Diagnosis not present

## 2023-02-10 DIAGNOSIS — E785 Hyperlipidemia, unspecified: Secondary | ICD-10-CM

## 2023-02-10 LAB — POCT GLYCOSYLATED HEMOGLOBIN (HGB A1C): Hemoglobin A1C: 6.7 % — AB (ref 4.0–5.6)

## 2023-02-10 MED ORDER — BD PEN NEEDLE NANO 2ND GEN 32G X 4 MM MISC
1.0000 | Freq: Every day | 3 refills | Status: DC
  Filled 2023-06-20: qty 100, 30d supply, fill #0

## 2023-02-10 MED ORDER — EMPAGLIFLOZIN 25 MG PO TABS: 25.0000 mg | ORAL_TABLET | Freq: Every day | ORAL | 2 refills | Status: DC

## 2023-02-10 MED ORDER — LANTUS SOLOSTAR 100 UNIT/ML ~~LOC~~ SOPN
14.0000 [IU] | PEN_INJECTOR | Freq: Every day | SUBCUTANEOUS | 2 refills | Status: DC
Start: 2023-02-10 — End: 2023-08-25

## 2023-02-10 MED ORDER — METFORMIN HCL 1000 MG PO TABS: 1000.0000 mg | ORAL_TABLET | Freq: Two times a day (BID) | ORAL | 2 refills | Status: DC

## 2023-02-10 MED ORDER — ATORVASTATIN CALCIUM 20 MG PO TABS: 20.0000 mg | ORAL_TABLET | Freq: Every day | ORAL | 2 refills | Status: DC

## 2023-02-10 NOTE — Progress Notes (Signed)
 Name: April Mcmahon  MRN/ DOB: [id number], [birth year]   Age/ Sex: 63 y.o., female    PCP: Vicci Barnie NOVAK, MD   Reason for Endocrinology Evaluation: Type 2 Diabetes Mellitus     Date of Initial Endocrinology Visit: 10/17/2020    PATIENT IDENTIFIER: April Mcmahon is a 63 y.o. female with a past medical history of T2DM, HTN and Dyslipidemia . The patient presented for initial endocrinology clinic visit on 10/17/2020 for consultative assistance with her diabetes management.     DIABETIC HISTORY:  April Mcmahon  was diagnosed with DM yrs ago. Her  hemoglobin A1c has ranged from 6.3% in 2018, peaking at 10.3% in 2022.  On her initial visit to our clinic she had an A1c of 11.3%, she was on glipizide, Jardiance , and metformin  as well as basal insulin .  We stopped glimepiride .  Continued metformin , increase Jardiance , and switch Levemir  to Lantus    SUBJECTIVE:   During the last visit (01/27/2022): A1c 7.6 %      Today (02/10/23): April Mcmahon is here for a follow up on diabetes management.  She is accompanied by her daughter In-Law . She has NOT been to our clinic in 13 months. checks her  blood sugars 2 times daily. The patient has not had hypoglycemic episodes since the last clinic visit.    She is accompanied by her daughter in-law   Denies nausea or vomiting  Denies constipation or diarrhea    HOME Endocrine  REGIMEN: Jardiance  25 mg daily  Lantus  14 units daily  Metformin  1000 mg BID Atorvastatin  20 mg daily    Statin: yes ACE-I/ARB: yes Prior Diabetic Education: no   METER DOWNLOAD SUMMARY: unable to download 90 day average 127 mg/dL    17-769 mg/dL    DIABETIC COMPLICATIONS: Microvascular complications:  Neuropathy  Denies: CKD, retinopathy  Last eye exam: Completed 2022  Macrovascular complications:   Denies: CAD, PVD, CVA   PAST HISTORY: Past Medical History:  Past Medical History:  Diagnosis Date   Bilateral chronic knee pain 06/2014    Chronic low back pain 06/2014   Chronic migraine 02/1985   Depression    hx of   Diabetes mellitus without complication (HCC) 11/2014   Fall    Hyperlipidemia    Hypertension 09/2013   Hypothyroidism    Shoulder pain    Past Surgical History:  Past Surgical History:  Procedure Laterality Date   no previous surgeries      Social History:  reports that she has never smoked. She has never used smokeless tobacco. She reports that she does not drink alcohol and does not use drugs. Family History:  Family History  Problem Relation Age of Onset   Migraines Mother    Esophageal cancer Other    Breast cancer Neg Hx    Colon cancer Neg Hx    Rectal cancer Neg Hx    Stomach cancer Neg Hx      HOME MEDICATIONS: Allergies as of 02/10/2023       Reactions   Cymbalta  [duloxetine  Hcl]    Nausea and dizziness   Elavil  [amitriptyline  Hcl] Diarrhea, Nausea And Vomiting        Medication List        Accurate as of February 10, 2023 10:22 AM. If you have any questions, ask your nurse or doctor.          Accu-Chek Guide Me w/Device Kit Check blood sugar three times daily. Dx E11.42   Accu-Chek Guide test  strip Generic drug: glucose blood Check BS three times a day before meals.  E11.42   Accu-Chek Softclix Lancets lancets Use as instructed to Check blood sugar three times daily.  E11.42   amLODipine  10 MG tablet Commonly known as: NORVASC  Take 1 tablet (10 mg total) by mouth daily.   atorvastatin  20 MG tablet Commonly known as: LIPITOR Take 1 tablet (20 mg total) by mouth daily.   BD Pen Needle Nano 2nd Gen 32G X 4 MM Misc Generic drug: Insulin  Pen Needle 1 DEVICE BY DOES NOT APPLY ROUTE DAILY IN THE AFTERNOON.   empagliflozin  25 MG Tabs tablet Commonly known as: Jardiance  Take 1 tablet (25 mg total) by mouth daily before breakfast.   fluticasone  50 MCG/ACT nasal spray Commonly known as: FLONASE  Place 2 sprays into both nostrils daily.   gabapentin  600 MG  tablet Commonly known as: Neurontin  Take 0.5 tablets (300 mg total) by mouth 3 (three) times daily.   Lantus  SoloStar 100 UNIT/ML Solostar Pen Generic drug: insulin  glargine Inject 14 Units into the skin daily.   levothyroxine  50 MCG tablet Commonly known as: SYNTHROID  Take 1 tablet (50 mcg total) by mouth daily.   loratadine  10 MG tablet Commonly known as: CLARITIN  Take 1 tablet (10 mg total) by mouth daily. What changed:  when to take this reasons to take this   Meloxicam  15 MG Tbdp Take 15 mg by mouth daily.   meloxicam  7.5 MG tablet Commonly known as: Mobic  Take 1 tablet (7.5 mg total) by mouth daily as needed for pain.   metFORMIN  1000 MG tablet Commonly known as: GLUCOPHAGE  TAKE 1 TABLET (1,000 MG TOTAL) BY MOUTH TWICE A DAY WITH FOOD   SUMAtriptan  50 MG tablet Commonly known as: Imitrex  Take 1 tablet (50 mg total) by mouth every 2 (two) hours as needed for migraine. Take 1 tab at start of headache; may repeat in 2 hrs if headache still present.  Max 2 tabs/24 hr.   triamcinolone  0.025 % ointment Commonly known as: KENALOG  Apply 1 Application topically 2 (two) times daily.   valsartan  40 MG tablet Commonly known as: DIOVAN  Take 0.5 tablets (20 mg total) by mouth daily.         ALLERGIES: Allergies  Allergen Reactions   Cymbalta  [Duloxetine  Hcl]     Nausea and dizziness   Elavil  [Amitriptyline  Hcl] Diarrhea and Nausea And Vomiting        OBJECTIVE:   VITAL SIGNS: BP 122/76 (BP Location: Left Arm, Patient Position: Sitting, Cuff Size: Large)   Pulse 61   Ht 5' (1.524 m)   Wt 149 lb (67.6 kg)   SpO2 99%   BMI 29.10 kg/m    PHYSICAL EXAM:  General: Pt appears well and is in NAD  Lungs: Clear with good BS bilat   Heart: RRR   Extremities:  Lower extremities - No pretibial edema  Neuro: MS is good with appropriate affect, pt is alert and Ox3    DM Foot Exam 02/10/2023  The skin of the feet is intact without sores or ulcerations. The pedal  pulses are 2+ on right and 2+ on left. The sensation is intact to a screening 5.07, 10 gram monofilament bilaterally     DATA REVIEWED:  Lab Results  Component Value Date   HGBA1C 6.7 (A) 02/10/2023   HGBA1C 7.3 (A) 11/08/2022   HGBA1C 7.1 (A) 07/08/2022   Lab Results  Component Value Date   MICROALBUR 3.8 01/20/2016   LDLCALC 49 02/02/2022  CREATININE 0.66 02/02/2022   Lab Results  Component Value Date   MICRALBCREAT 26 11/08/2022    Lab Results  Component Value Date   CHOL 144 02/02/2022   HDL 55 02/02/2022   LDLCALC 49 02/02/2022   TRIG 262 (H) 02/02/2022   CHOLHDL 2.6 02/02/2022        ASSESSMENT / PLAN / RECOMMENDATIONS:   1) Type 2 Diabetes Mellitus, optimally controlled, With neuropathic  complications - Most recent A1c of 7.6  %. Goal A1c < 7.0 %.     - A1  at goal  - Pt with language barrier - No changes  -The patient was supposed to have labs drawn today, but it appears this was not done at checkout  MEDICATIONS:  -Continue Jardiance   25 mg daily  -Continue Metformin  1000 mg , twice a day  -Continue  Lantus  14 units daily   EDUCATION / INSTRUCTIONS: BG monitoring instructions: Patient is instructed to check her blood sugars 3 times a day, before meals . Call [facility] if: BG persistently < 70  I reviewed the Rule of 15 for the treatment of hypoglycemia in detail with the patient. Literature supplied.   2) Diabetic complications:  Eye: Does not have known diabetic retinopathy.  Neuro/ Feet: Does  have known diabetic peripheral neuropathy. Renal: Patient does not have known baseline CKD. She is  on an ACEI/ARB at present.     3) Dyslipidemia:  -We had increased her atorvastatin  due to LDL above goal at 109 mg/DL in 02/7975 -Patient was supposed to have lipid panel drawn today but it appears this was not done   Medication   Continue atorvastatin  20 mg daily   F/U in 6 months     Signed electronically by: Stefano Redgie Butts, MD  [facility]  [facility] [address] [place] [place], [place] [address] Phone: [phone] FAX: [phone]   CC: Vicci Barnie NOVAK, MD [address] [place] [address] Phone: [phone]  Fax: [phone]    Return to Endocrinology clinic as below: Future Appointments  Date Time Provider Department Center  03/14/2023  3:50 PM Vicci Barnie NOVAK, MD [facility] None

## 2023-02-10 NOTE — Patient Instructions (Addendum)
-   Continue Jardiance  25 mg daily  - Continue Metformin  1000 mg , twice a day  - Continue Lantus  14 units once daily  - Continue Atorvastatin  to 20 mg daily     HOW TO TREAT LOW BLOOD SUGARS (Blood sugar LESS THAN 70 MG/DL) Please follow the RULE OF 15 for the treatment of hypoglycemia treatment (when your (blood sugars are less than 70 mg/dL)   STEP 1: Take 15 grams of carbohydrates when your blood sugar is low, which includes:  3-4 GLUCOSE TABS  OR 3-4 OZ OF JUICE OR REGULAR SODA OR ONE TUBE OF GLUCOSE GEL    STEP 2: RECHECK blood sugar in 15 MINUTES STEP 3: If your blood sugar is still low at the 15 minute recheck --> then, go back to STEP 1 and treat AGAIN with another 15 grams of carbohydrates.

## 2023-02-16 ENCOUNTER — Ambulatory Visit (HOSPITAL_COMMUNITY)
Admission: EM | Admit: 2023-02-16 | Discharge: 2023-02-16 | Disposition: A | Discharge status: Home / Self Care | Payer: Medicaid Other | Attending: Emergency Medicine | Admitting: Emergency Medicine

## 2023-02-16 ENCOUNTER — Other Ambulatory Visit: Payer: Self-pay

## 2023-02-16 ENCOUNTER — Other Ambulatory Visit: Payer: Medicaid Other

## 2023-02-16 ENCOUNTER — Encounter (HOSPITAL_COMMUNITY): Payer: Self-pay | Admitting: *Deleted

## 2023-02-16 DIAGNOSIS — R6889 Other general symptoms and signs: Secondary | ICD-10-CM

## 2023-02-16 DIAGNOSIS — B349 Viral infection, unspecified: Secondary | ICD-10-CM | POA: Diagnosis not present

## 2023-02-16 LAB — POC COVID19/FLU A&B COMBO
Covid Antigen, POC: NEGATIVE
Influenza A Antigen, POC: NEGATIVE
Influenza B Antigen, POC: NEGATIVE

## 2023-02-16 NOTE — ED Triage Notes (Signed)
 Pt has had body aches and HA for 3 days.

## 2023-02-16 NOTE — ED Provider Notes (Signed)
 [facility]    CSN: [id number] Arrival date & time: 02/16/23  1252      History   Chief Complaint Chief Complaint  Patient presents with   Generalized Body Aches   Headache    HPI April Mcmahon is a 63 y.o. female. Friend is here interpreting for her  Reports 3 days of body aches, headache, runny nose and cough. Denies SOB. Denies sore throat. Has not tested self for covid at home. Taking tylenol , last dose thsi morning, does not feel it is helping body aches. No other home treatments.     Headache   Past Medical History:  Diagnosis Date   Bilateral chronic knee pain 06/2014   Chronic low back pain 06/2014   Chronic migraine 02/1985   Depression    hx of   Diabetes mellitus without complication (HCC) 11/2014   Fall    Hyperlipidemia    Hypertension 09/2013   Hypothyroidism    Shoulder pain     Patient Active Problem List   Diagnosis Date Noted   Contusion of right foot 10/30/2021   Viral URI with cough 10/30/2021   History of diabetes mellitus, type II 10/30/2021   History of hypertension 10/30/2021   Hyperglycemia 10/30/2021   Vitamin B12 deficiency 09/24/2021   DM type 2 with diabetic peripheral neuropathy (HCC) 06/15/2021   Dizziness 06/15/2021   Dyslipidemia 10/17/2020   Uncontrolled type 2 diabetes mellitus with peripheral neuropathy 10/17/2020   Hyperlipidemia associated with type 2 diabetes mellitus (HCC) 05/05/2020   Influenza vaccine needed 01/03/2020   Former smoker 01/03/2020   Acquired hypothyroidism 01/03/2020   Microalbuminuria 11/15/2018   Closed fracture of distal phalanx of great toe 08/23/2018   Muscle cramps 02/21/2018   Hepatic steatosis 12/23/2017   MCI (mild cognitive impairment) 02/14/2017   Diabetic peripheral neuropathy (HCC) 02/14/2017   Memory loss 12/27/2016   Dysfunctional uterine bleeding 08/30/2016   Analgesic rebound headache 06/24/2016   Esophageal dysphagia 06/21/2016   Tobacco dependence 01/20/2016    Plantar fasciitis, bilateral 12/24/2015   DJD (degenerative joint disease) of knee 06/20/2015   Type 2 diabetes mellitus with peripheral neuropathy (HCC) 06/19/2015   Essential hypertension 06/19/2015   Vitamin D  deficiency 09/24/2013   Migraine 05/15/2013   Low back pain 08/18/2012    Past Surgical History:  Procedure Laterality Date   no previous surgeries      OB History   No obstetric history on file.      Home Medications    Prior to Admission medications   Medication Sig Start Date End Date Taking? Authorizing Provider  Accu-Chek Softclix Lancets lancets Use as instructed to Check blood sugar three times daily.  E11.42 11/08/22  Yes Lawrance Presume, MD  amLODipine  (NORVASC ) 10 MG tablet Take 1 tablet (10 mg total) by mouth daily. 11/08/22  Yes Lawrance Presume, MD  atorvastatin  (LIPITOR) 20 MG tablet Take 1 tablet (20 mg total) by mouth daily. 02/10/23  Yes Shamleffer, Ibtehal Jaralla, MD  Blood Glucose Monitoring Suppl (ACCU-CHEK GUIDE ME) w/Device KIT Check blood sugar three times daily. Dx E11.42 10/01/20  Yes Lawrance Presume, MD  empagliflozin  (JARDIANCE ) 25 MG TABS tablet Take 1 tablet (25 mg total) by mouth daily before breakfast. 02/10/23  Yes Shamleffer, Ibtehal Jaralla, MD  fluticasone  (FLONASE ) 50 MCG/ACT nasal spray Place 2 sprays into both nostrils daily. 03/13/21  Yes Fleming, Zelda W, NP  glucose blood (ACCU-CHEK GUIDE) test strip Check BS three times a day before meals.  E11.42 11/08/22  Yes Lawrance Presume, MD  insulin  glargine (LANTUS  SOLOSTAR) 100 UNIT/ML Solostar Pen Inject 14 Units into the skin daily. 02/10/23  Yes Shamleffer, Ibtehal Jaralla, MD  Insulin  Pen Needle (BD PEN NEEDLE NANO 2ND GEN) 32G X 4 MM MISC 1 Device by Other route daily in the afternoon. 02/10/23  Yes Shamleffer, Ibtehal Jaralla, MD  levothyroxine  (SYNTHROID ) 50 MCG tablet Take 1 tablet (50 mcg total) by mouth daily. 07/10/22  Yes Lawrance Presume, MD  meloxicam  (MOBIC ) 7.5 MG tablet Take  1 tablet (7.5 mg total) by mouth daily as needed for pain. 10/05/22  Yes Sandie Cross, PA-C  Meloxicam  15 MG TBDP Take 15 mg by mouth daily. 02/02/22  Yes Lawrance Presume, MD  metFORMIN  (GLUCOPHAGE ) 1000 MG tablet Take 1 tablet (1,000 mg total) by mouth 2 (two) times daily with a meal. 02/10/23  Yes Shamleffer, Julian Obey, MD  valsartan  (DIOVAN ) 40 MG tablet Take 0.5 tablets (20 mg total) by mouth daily. 07/08/22  Yes Lawrance Presume, MD  gabapentin  (NEURONTIN ) 600 MG tablet Take 0.5 tablets (300 mg total) by mouth 3 (three) times daily. 03/13/21 01/27/22  Fleming, Zelda W, NP  loratadine  (CLARITIN ) 10 MG tablet Take 1 tablet (10 mg total) by mouth daily. Patient taking differently: Take 10 mg by mouth at bedtime as needed for allergies. 04/21/21   Lawrance Presume, MD  SUMAtriptan  (IMITREX ) 50 MG tablet Take 1 tablet (50 mg total) by mouth every 2 (two) hours as needed for migraine. Take 1 tab at start of headache; may repeat in 2 hrs if headache still present.  Max 2 tabs/24 hr. 02/02/22   Lawrance Presume, MD  triamcinolone  (KENALOG ) 0.025 % ointment Apply 1 Application topically 2 (two) times daily. 11/08/22   Lawrance Presume, MD  venlafaxine  (EFFEXOR ) 37.5 MG tablet Take 1 tablet (37.5 mg total) by mouth 2 (two) times daily with a meal. 02/09/17 12/14/18  Phebe Brasil, MD    Family History Family History  Problem Relation Age of Onset   Migraines Mother    Esophageal cancer Other    Breast cancer Neg Hx    Colon cancer Neg Hx    Rectal cancer Neg Hx    Stomach cancer Neg Hx     Social History Social History   Tobacco Use   Smoking status: Never   Smokeless tobacco: Never  Vaping Use   Vaping status: Never Used  Substance Use Topics   Alcohol use: No   Drug use: No     Allergies   Cymbalta  [duloxetine  hcl] and Elavil  [amitriptyline  hcl]   Review of Systems Review of Systems  Neurological:  Positive for headaches.     Physical Exam Triage Vital Signs ED Triage  Vitals  Encounter Vitals Group     BP 02/16/23 1449 (!) 148/74     Systolic BP Percentile --      Diastolic BP Percentile --      Pulse Rate 02/16/23 1339 92     Resp 02/16/23 1449 20     Temp 02/16/23 1449 98.7 F (37.1 C)     Temp src --      SpO2 02/16/23 1339 94 %     Weight --      Height --      Head Circumference --      Peak Flow --      Pain Score 02/16/23 1447 10     Pain Loc --  Pain Education --      Exclude from Growth Chart --    No data found.  Updated Vital Signs BP (!) 148/74   Pulse 93   Temp 98.7 F (37.1 C)   Resp 20   SpO2 95%   Visual Acuity Right Eye Distance:   Left Eye Distance:   Bilateral Distance:    Right Eye Near:   Left Eye Near:    Bilateral Near:     Physical Exam Constitutional:      Appearance: She is well-developed.     Comments: Appears to not feel well  HENT:     Right Ear: Tympanic membrane, ear canal and external ear normal.     Left Ear: Tympanic membrane, ear canal and external ear normal.     Nose: Rhinorrhea present.     Mouth/Throat:     Mouth: Mucous membranes are moist.     Pharynx: Oropharynx is clear. No oropharyngeal exudate or posterior oropharyngeal erythema.  Cardiovascular:     Rate and Rhythm: Normal rate and regular rhythm.  Pulmonary:     Effort: Pulmonary effort is normal.     Breath sounds: Normal breath sounds.  Neurological:     Mental Status: She is alert.      UC Treatments / Results  Labs (all labs ordered are listed, but only abnormal results are displayed) Labs Reviewed  POC COVID19/FLU A&B COMBO    EKG   Radiology No results found.  Procedures Procedures (including critical care time)  Medications Ordered in UC Medications - No data to display  Initial Impression / Assessment and Plan / UC Course  I have reviewed the triage vital signs and the nursing notes.  Pertinent labs & imaging results that were available during my care of the patient were reviewed by me and  considered in my medical decision making (see chart for details).    Covid and flu negative. Likely other viral infection. Discussed supportive care.   Final Clinical Impressions(s) / UC Diagnoses   Final diagnoses:  Flu-like symptoms  Viral illness     Discharge Instructions      You do not have COVID or the flu (influenza). You likely have a different virus infection.   I recommend taking Mucinex DM (or the generic brand of it) for cough and runny nose/congestion.   For body aches and headache,I recommend you continue taking tylenol  according to package directions.   Make sure to drink lots of fluids and stay hydrated. Rest as much as you need.     ED Prescriptions   None    PDMP not reviewed this encounter.   Blinda Burger, NP 02/16/23 1553

## 2023-02-16 NOTE — Discharge Instructions (Signed)
 You do not have COVID or the flu (influenza). You likely have a different virus infection.   I recommend taking Mucinex DM (or the generic brand of it) for cough and runny nose/congestion.   For body aches and headache,I recommend you continue taking tylenol  according to package directions.   Make sure to drink lots of fluids and stay hydrated. Rest as much as you need.

## 2023-02-28 ENCOUNTER — Telehealth: Payer: Self-pay

## 2023-02-28 NOTE — Telephone Encounter (Signed)
Copied from CRM [id number]. Topic: General - Other >> Feb 28, 2023  4:35 PM Antony Haste wrote: Urology [facility] Staff faxed paperwork on 01/24 for this patient, advised it has not been received. She is wanting to receive a callback once it's been received. CB #: [phone]

## 2023-03-02 ENCOUNTER — Other Ambulatory Visit: Payer: Self-pay | Admitting: Internal Medicine

## 2023-03-03 NOTE — Telephone Encounter (Signed)
Noted - will keep a look out for paperwork

## 2023-03-04 NOTE — Telephone Encounter (Signed)
 FYI

## 2023-03-04 NOTE — Telephone Encounter (Signed)
April Mcmahon  case manager w/ [place] [facility] calling to follow up on order for pt to have chuck pads added to the supplies they get now. They are asking for 60 /mo. She asked if we could follow up w/ [facility] if you have not received.  Cb [phone]

## 2023-03-08 NOTE — Telephone Encounter (Signed)
[facility] order received for incontinence supplies. Order placed in provider box for signature.

## 2023-03-10 NOTE — Telephone Encounter (Signed)
 Signed order received and successfully faxed back to [facility] on 03/10/2023. Fax #: [phone].  Called & LVM for April Mcmahon informing that order has been successfully faxed back for the incontinence supplies to [facility].

## 2023-03-11 NOTE — Telephone Encounter (Signed)
 Routing to CMA for follow-up

## 2023-03-11 NOTE — Telephone Encounter (Signed)
 I suspect that the blank fax is a fax machine error. I went ahead and successfully re-faxed the orders on 03/11/2023 to [facility] using fax #: [phone].

## 2023-03-11 NOTE — Telephone Encounter (Signed)
 Copied from CRM [id number]. Topic: General - Other >> Mar 11, 2023  9:21 AM Susanna ORN wrote: Reason for CRM: Fairy, with [facility], called stating that they faxed over a prescription for provider to sign for incontinence supplies for patient. He stated it was sent back to them blank & he's wondering if it was a mistake or does the patient needs an appointment or what? Per notes, it was received & successfully faxed back to [facility]. As I was checking with CAL line & speaking with Zara, Joseph disconnected the line. Delma stated she would send message back to Clarisa to have her re-fax.

## 2023-03-14 ENCOUNTER — Telehealth: Payer: Self-pay

## 2023-03-14 ENCOUNTER — Encounter: Payer: Self-pay | Admitting: Internal Medicine

## 2023-03-14 ENCOUNTER — Ambulatory Visit: Payer: Medicaid Other | Attending: Internal Medicine | Admitting: Internal Medicine

## 2023-03-14 VITALS — BP 134/70 | HR 77 | Temp 97.6°F | Ht 60.0 in | Wt 150.0 lb

## 2023-03-14 DIAGNOSIS — Z79899 Other long term (current) drug therapy: Secondary | ICD-10-CM | POA: Diagnosis not present

## 2023-03-14 DIAGNOSIS — I10 Essential (primary) hypertension: Secondary | ICD-10-CM | POA: Diagnosis not present

## 2023-03-14 DIAGNOSIS — Z7984 Long term (current) use of oral hypoglycemic drugs: Secondary | ICD-10-CM | POA: Diagnosis not present

## 2023-03-14 DIAGNOSIS — M170 Bilateral primary osteoarthritis of knee: Secondary | ICD-10-CM | POA: Diagnosis not present

## 2023-03-14 DIAGNOSIS — E1159 Type 2 diabetes mellitus with other circulatory complications: Secondary | ICD-10-CM | POA: Diagnosis not present

## 2023-03-14 DIAGNOSIS — E1142 Type 2 diabetes mellitus with diabetic polyneuropathy: Secondary | ICD-10-CM

## 2023-03-14 DIAGNOSIS — N3941 Urge incontinence: Secondary | ICD-10-CM | POA: Diagnosis not present

## 2023-03-14 DIAGNOSIS — E785 Hyperlipidemia, unspecified: Secondary | ICD-10-CM | POA: Insufficient documentation

## 2023-03-14 DIAGNOSIS — Z794 Long term (current) use of insulin: Secondary | ICD-10-CM | POA: Diagnosis not present

## 2023-03-14 DIAGNOSIS — E039 Hypothyroidism, unspecified: Secondary | ICD-10-CM

## 2023-03-14 DIAGNOSIS — Z87891 Personal history of nicotine dependence: Secondary | ICD-10-CM | POA: Insufficient documentation

## 2023-03-14 DIAGNOSIS — E1169 Type 2 diabetes mellitus with other specified complication: Secondary | ICD-10-CM

## 2023-03-14 DIAGNOSIS — I152 Hypertension secondary to endocrine disorders: Secondary | ICD-10-CM

## 2023-03-14 DIAGNOSIS — E119 Type 2 diabetes mellitus without complications: Secondary | ICD-10-CM

## 2023-03-14 MED ORDER — METFORMIN HCL 1000 MG PO TABS: 1000.0000 mg | ORAL_TABLET | Freq: Two times a day (BID) | ORAL | 2 refills | Status: DC

## 2023-03-14 MED ORDER — EMPAGLIFLOZIN 25 MG PO TABS
25.0000 mg | ORAL_TABLET | Freq: Every day | ORAL | 2 refills | Status: DC
Start: 2023-03-14 — End: 2023-08-25

## 2023-03-14 MED ORDER — VALSARTAN 40 MG PO TABS
20.0000 mg | ORAL_TABLET | Freq: Every day | ORAL | 1 refills | Status: AC
Start: 2023-03-14 — End: ?

## 2023-03-14 MED ORDER — AMLODIPINE BESYLATE 10 MG PO TABS: 10.0000 mg | ORAL_TABLET | Freq: Every day | ORAL | 1 refills | Status: AC

## 2023-03-14 MED ORDER — LEVOTHYROXINE SODIUM 50 MCG PO TABS
50.0000 ug | ORAL_TABLET | Freq: Every day | ORAL | 1 refills | Status: DC
Start: 2023-03-14 — End: 2023-08-15

## 2023-03-14 NOTE — Telephone Encounter (Signed)
 Copied from CRM [id number]. Topic: Clinical - Prescription Issue >> Mar 14, 2023  9:54 AM Carlatta H wrote: Reason for CRM: [facility] called and stated order for incontinence supply was faxed back but not signed please sign and fax to [phone]

## 2023-03-14 NOTE — Progress Notes (Signed)
 Patient ID: April Mcmahon, female    DOB: 1960/03/06  MRN: [id number]  CC: Hypertension (HTN f/u. Med refills. /No questions / concerns/Already received flu vax)   Subjective: April Mcmahon is a 63 y.o. female who presents for chronic ds management. Daughter-in-law, Chapman Commodore. Is with her  Her concerns today include:  Pt with hx of DM neuropathy, HTN, HL, former tob dep, migraines, Vit D def, DJD knees and plantar fasciitis, Abn LFT with steatosis on U/S, BL CTS, hypothyroid, spinal stenosis cervical spine   AM[employer] Language interpreter used during this encounter. # Basant [id number]  DM:  Lab Results  Component Value Date   HGBA1C 6.7 (A) 02/10/2023  on Lantus  insulin  14 units, metformin  1 g twice a day and Jardiance  25 mg daily. Reports compliance with meds Checks BS in mornings only before BF 89-123.  Does well with eating habits.  HTN: Diovan  40 mg half a tablet daily and Norvasc  10 mg daily. Takes both in the evenings. Checks BP once a day. Reprts SBP usually 120s but can go to 140s. Limits salt.  No chest pains or shortness of breath.  No swelling in the legs.  Reports compliance with taking atorvastatin  20 mg daily. Hypothyroid: Compliant with taking the levothyroxine  as prescribed every day.  Needs refills.  Due for TSH check.  No feeling of being hot or cold all the time. I received a request from [employer] for her to continue to get incontinence supplies.  Patient and daughter-in-law confirms that she still has problems holding her urine when she gets the urge to urinate.  She also sometimes wets the bed at night if she is not able to get up quickly to get to the restroom. Patient Active Problem List   Diagnosis Date Noted   Contusion of right foot 10/30/2021   Viral URI with cough 10/30/2021   History of diabetes mellitus, type II 10/30/2021   History of hypertension 10/30/2021   Hyperglycemia 10/30/2021   Vitamin B12 deficiency 09/24/2021   DM type 2 with diabetic peripheral  neuropathy (HCC) 06/15/2021   Dizziness 06/15/2021   Dyslipidemia 10/17/2020   Uncontrolled type 2 diabetes mellitus with peripheral neuropathy 10/17/2020   Hyperlipidemia associated with type 2 diabetes mellitus (HCC) 05/05/2020   Influenza vaccine needed 01/03/2020   Former smoker 01/03/2020   Acquired hypothyroidism 01/03/2020   Microalbuminuria 11/15/2018   Closed fracture of distal phalanx of great toe 08/23/2018   Muscle cramps 02/21/2018   Hepatic steatosis 12/23/2017   MCI (mild cognitive impairment) 02/14/2017   Diabetic peripheral neuropathy (HCC) 02/14/2017   Memory loss 12/27/2016   Dysfunctional uterine bleeding 08/30/2016   Analgesic rebound headache 06/24/2016   Esophageal dysphagia 06/21/2016   Tobacco dependence 01/20/2016   Plantar fasciitis, bilateral 12/24/2015   DJD (degenerative joint disease) of knee 06/20/2015   Type 2 diabetes mellitus with peripheral neuropathy (HCC) 06/19/2015   Essential hypertension 06/19/2015   Vitamin D  deficiency 09/24/2013   Migraine 05/15/2013   Low back pain 08/18/2012     Current Outpatient Medications on File Prior to Visit  Medication Sig Dispense Refill   Accu-Chek Softclix Lancets lancets Use as instructed to Check blood sugar three times daily.  E11.42 100 each 12   atorvastatin  (LIPITOR) 20 MG tablet Take 1 tablet (20 mg total) by mouth daily. 90 tablet 2   Blood Glucose Monitoring Suppl (ACCU-CHEK GUIDE ME) w/Device KIT Check blood sugar three times daily. Dx E11.42 1 kit 0   fluticasone  (FLONASE ) 50 MCG/ACT  nasal spray Place 2 sprays into both nostrils daily. 16 g 0   glucose blood (ACCU-CHEK GUIDE) test strip Check BS three times a day before meals.  E11.42 100 strip 11   insulin  glargine (LANTUS  SOLOSTAR) 100 UNIT/ML Solostar Pen Inject 14 Units into the skin daily. 15 mL 2   Insulin  Pen Needle (BD PEN NEEDLE NANO 2ND GEN) 32G X 4 MM MISC 1 Device by Other route daily in the afternoon. 100 each 3   loratadine   (CLARITIN ) 10 MG tablet Take 1 tablet (10 mg total) by mouth daily. (Patient taking differently: Take 10 mg by mouth at bedtime as needed for allergies.) 30 tablet 3   meloxicam  (MOBIC ) 7.5 MG tablet Take 1 tablet (7.5 mg total) by mouth daily as needed for pain. 30 tablet 2   Meloxicam  15 MG TBDP Take 15 mg by mouth daily. 30 tablet 2   SUMAtriptan  (IMITREX ) 50 MG tablet Take 1 tablet (50 mg total) by mouth every 2 (two) hours as needed for migraine. Take 1 tab at start of headache; may repeat in 2 hrs if headache still present.  Max 2 tabs/24 hr. 10 tablet 2   triamcinolone  (KENALOG ) 0.025 % ointment Apply 1 Application topically 2 (two) times daily. 30 g 0   gabapentin  (NEURONTIN ) 600 MG tablet Take 0.5 tablets (300 mg total) by mouth 3 (three) times daily. 45 tablet 1   [DISCONTINUED] venlafaxine  (EFFEXOR ) 37.5 MG tablet Take 1 tablet (37.5 mg total) by mouth 2 (two) times daily with a meal. 60 tablet 11   No current facility-administered medications on file prior to visit.    Allergies  Allergen Reactions   Cymbalta  [Duloxetine  Hcl]     Nausea and dizziness   Elavil  [Amitriptyline  Hcl] Diarrhea and Nausea And Vomiting    Social History   Socioeconomic History   Marital status: Married    Spouse name: Not on file   Number of children: Not on file   Years of education: Not on file   Highest education level: Not on file  Occupational History   Not on file  Tobacco Use   Smoking status: Never   Smokeless tobacco: Never  Vaping Use   Vaping status: Never Used  Substance and Sexual Activity   Alcohol use: No   Drug use: No   Sexual activity: Not on file  Other Topics Concern   Not on file  Social History Narrative   Not on file   Social Drivers of Health   Financial Resource Strain: Not on file  Food Insecurity: Food Insecurity Present (03/14/2023)   Hunger Vital Sign    Worried About Running Out of Food in the Last Year: Sometimes true    Ran Out of Food in the Last  Year: Sometimes true  Transportation Needs: No Transportation Needs (03/14/2023)   PRAPARE - Administrator, Civil Service (Medical): No    Lack of Transportation (Non-Medical): No  Physical Activity: Not on file  Stress: Not on file  Social Connections: Not on file  Intimate Partner Violence: Not At Risk (03/14/2023)   Humiliation, Afraid, Rape, and Kick questionnaire    Fear of Current or Ex-Partner: No    Emotionally Abused: No    Physically Abused: No    Sexually Abused: No    Family History  Problem Relation Age of Onset   Migraines Mother    Esophageal cancer Other    Breast cancer Neg Hx    Colon cancer Neg Hx  Rectal cancer Neg Hx    Stomach cancer Neg Hx     Past Surgical History:  Procedure Laterality Date   no previous surgeries      ROS: Review of Systems Negative except as stated above  PHYSICAL EXAM: BP 134/70   Pulse 77   Temp 97.6 F (36.4 C) (Oral)   Ht 5' (1.524 m)   Wt 150 lb (68 kg)   SpO2 97%   BMI 29.29 kg/m   Physical Exam  General appearance - alert, well appearing, and in no distress Mental status - normal mood, behavior, speech, dress, motor activity, and thought processes Neck - supple, no significant adenopathy Chest - clear to auscultation, no wheezes, rales or rhonchi, symmetric air entry Heart - normal rate, regular rhythm, normal S1, S2, no murmurs, rubs, clicks or gallops Extremities - peripheral pulses normal, no pedal edema, no clubbing or cyanosis Diabetic Foot Exam - Simple   Simple Foot Form Diabetic Foot exam was performed with the following findings: Yes 03/14/2023  5:34 PM  Visual Inspection No deformities, no ulcerations, no other skin breakdown bilaterally: Yes Sensation Testing Intact to touch and monofilament testing bilaterally: Yes Pulse Check Posterior Tibialis and Dorsalis pulse intact bilaterally: Yes Comments         Latest Ref Rng & Units 02/02/2022    2:51 PM 07/08/2021    2:13 PM  12/22/2020   10:14 AM  CMP  Glucose 70 - 99 mg/dL 409  811  90   BUN 8 - 27 mg/dL 17  10  10    Creatinine 0.57 - 1.00 mg/dL 9.14  7.82  9.56   Sodium 134 - 144 mmol/L 145  140  144   Potassium 3.5 - 5.2 mmol/L 4.2  3.9  4.5   Chloride 96 - 106 mmol/L 108  111  106   CO2 20 - 29 mmol/L 22  23  22    Calcium  8.7 - 10.3 mg/dL 9.4  9.3  9.6   Total Protein 6.0 - 8.5 g/dL 7.5   7.7   Total Bilirubin 0.0 - 1.2 mg/dL 0.3   0.5   Alkaline Phos 44 - 121 IU/L 119   126   AST 0 - 40 IU/L 18   27   ALT 0 - 32 IU/L 18   32    Lipid Panel     Component Value Date/Time   CHOL 144 02/02/2022 1451   TRIG 262 (H) 02/02/2022 1451   HDL 55 02/02/2022 1451   CHOLHDL 2.6 02/02/2022 1451   CHOLHDL 3.3 10/20/2015 1625   VLDL 46 (H) 10/20/2015 1625   LDLCALC 49 02/02/2022 1451    CBC    Component Value Date/Time   WBC 7.8 02/02/2022 1451   WBC 9.7 10/10/2017 1941   RBC 4.80 02/02/2022 1451   RBC 4.81 10/10/2017 1941   HGB 12.7 02/02/2022 1451   HCT 38.6 02/02/2022 1451   PLT 259 02/02/2022 1451   MCV 80 02/02/2022 1451   MCH 26.5 (L) 02/02/2022 1451   MCH 26.4 10/10/2017 1941   MCHC 32.9 02/02/2022 1451   MCHC 31.0 10/10/2017 1941   RDW 14.8 02/02/2022 1451   LYMPHSABS 3.2 (H) 06/15/2021 1615   MONOABS 335 10/20/2015 1625   EOSABS 0.2 06/15/2021 1615   BASOSABS 0.0 06/15/2021 1615    ASSESSMENT AND PLAN: 1. Type 2 diabetes mellitus with peripheral neuropathy (HCC) (Primary) At goal.  Continue Jardiance , metformin  and Lantus  insulin  14 units.  Encouraged her to continue  healthy eating habits - empagliflozin  (JARDIANCE ) 25 MG TABS tablet; Take 1 tablet (25 mg total) by mouth daily before breakfast.  Dispense: 90 tablet; Refill: 2 - CBC - Comprehensive metabolic panel  2. Diabetes mellitus treated with oral medication (HCC) 3. Insulin  long-term use (HCC) See #1 above.  4. Hypertension associated with type 2 diabetes mellitus (HCC) Repeat blood pressure closer to goal.  Continue  amlodipine  10 mg daily and valsartan  40 mg daily - amLODipine  (NORVASC ) 10 MG tablet; Take 1 tablet (10 mg total) by mouth daily.  Dispense: 90 tablet; Refill: 1 - valsartan  (DIOVAN ) 40 MG tablet; Take 0.5 tablets (20 mg total) by mouth daily.  Dispense: 90 tablet; Refill: 1  5. Hyperlipidemia associated with type 2 diabetes mellitus (HCC) Last LDL was at goal.  Continue atorvastatin  - Lipid panel  6. Acquired hypothyroidism - levothyroxine  (SYNTHROID ) 50 MCG tablet; Take 1 tablet (50 mcg total) by mouth daily.  Dispense: 90 tablet; Refill: 1 - TSH  7. Urge incontinence of urine Form will be completed for her to continue to get incontinence supplies to include disposable under pads and disposable undergarments  Patient was given the opportunity to ask questions.  Patient verbalized understanding of the plan and was able to repeat key elements of the plan.   This documentation was completed using Paediatric nurse.  Any transcriptional errors are unintentional.  Orders Placed This Encounter  Procedures   CBC   Comprehensive metabolic panel   Lipid panel   TSH     Requested Prescriptions   Signed Prescriptions Disp Refills   metFORMIN  (GLUCOPHAGE ) 1000 MG tablet 180 tablet 2    Sig: Take 1 tablet (1,000 mg total) by mouth 2 (two) times daily with a meal.   empagliflozin  (JARDIANCE ) 25 MG TABS tablet 90 tablet 2    Sig: Take 1 tablet (25 mg total) by mouth daily before breakfast.   amLODipine  (NORVASC ) 10 MG tablet 90 tablet 1    Sig: Take 1 tablet (10 mg total) by mouth daily.   levothyroxine  (SYNTHROID ) 50 MCG tablet 90 tablet 1    Sig: Take 1 tablet (50 mcg total) by mouth daily.   valsartan  (DIOVAN ) 40 MG tablet 90 tablet 1    Sig: Take 0.5 tablets (20 mg total) by mouth daily.    Return in about 4 months (around 07/12/2023).  Concetta Dee, MD, FACP

## 2023-03-15 LAB — COMPREHENSIVE METABOLIC PANEL
ALT: 20 [IU]/L (ref 0–32)
AST: 18 [IU]/L (ref 0–40)
Albumin: 4.6 g/dL (ref 3.9–4.9)
Alkaline Phosphatase: 133 [IU]/L — ABNORMAL HIGH (ref 44–121)
BUN/Creatinine Ratio: 22 (ref 12–28)
BUN: 13 mg/dL (ref 8–27)
Bilirubin Total: 0.3 mg/dL (ref 0.0–1.2)
CO2: 21 mmol/L (ref 20–29)
Calcium: 9.8 mg/dL (ref 8.7–10.3)
Chloride: 104 mmol/L (ref 96–106)
Creatinine, Ser: 0.58 mg/dL (ref 0.57–1.00)
Globulin, Total: 2.9 g/dL (ref 1.5–4.5)
Glucose: 113 mg/dL — ABNORMAL HIGH (ref 70–99)
Potassium: 4.7 mmol/L (ref 3.5–5.2)
Sodium: 142 mmol/L (ref 134–144)
Total Protein: 7.5 g/dL (ref 6.0–8.5)
eGFR: 103 mL/min/{1.73_m2} (ref >59)

## 2023-03-15 LAB — CBC
Hematocrit: 39.4 % (ref 34.0–46.6)
Hemoglobin: 12.4 g/dL (ref 11.1–15.9)
MCH: 25.3 pg — ABNORMAL LOW (ref 26.6–33.0)
MCHC: 31.5 g/dL (ref 31.5–35.7)
MCV: 80 fL (ref 79–97)
Platelets: 292 10*3/uL (ref 150–450)
RBC: 4.91 x10E6/uL (ref 3.77–5.28)
RDW: 14 % (ref 11.7–15.4)
WBC: 7.1 10*3/uL (ref 3.4–10.8)

## 2023-03-15 LAB — LIPID PANEL
Chol/HDL Ratio: 2.5 {ratio} (ref 0.0–4.4)
Cholesterol, Total: 135 mg/dL (ref 100–199)
HDL: 55 mg/dL (ref >39)
LDL Chol Calc (NIH): 31 mg/dL (ref 0–99)
Triglycerides: 338 mg/dL — ABNORMAL HIGH (ref 0–149)
VLDL Cholesterol Cal: 49 mg/dL — ABNORMAL HIGH (ref 5–40)

## 2023-03-15 LAB — TSH: TSH: 2.2 u[IU]/mL (ref 0.450–4.500)

## 2023-03-15 NOTE — Progress Notes (Signed)
Blood cell counts are normal. Kidney and liver function tests are good. Cholesterol levels are good except triglyceride levels are elevated.  Healthy eating habits and trying to move more will help to lower triglyceride levels.  Continue atorvastatin. Thyroid level is normal.  Continue current dose of levothyroxine.

## 2023-03-16 NOTE — Telephone Encounter (Signed)
I signed this form and gave it to you on Monday afternoon after I saw the patient.

## 2023-03-17 ENCOUNTER — Other Ambulatory Visit: Payer: Self-pay | Admitting: Internal Medicine

## 2023-03-17 DIAGNOSIS — L853 Xerosis cutis: Secondary | ICD-10-CM

## 2023-03-17 NOTE — Telephone Encounter (Signed)
Requested medication (s) are due for refill today: yes   Requested medication (s) are on the active medication list: yes   Last refill:  11/08/22 #30 g 0 refills  Future visit scheduled: yes in 3 months   Notes to clinic:   not delegated per protocol. Do you want to refill Rx?     Requested Prescriptions  Pending Prescriptions Disp Refills   triamcinolone (KENALOG) 0.025 % ointment [Pharmacy Med Name: TRIAMCINOLONE 0.025% OINT] 30 g 0    Sig: APPLY TO AFFECTED AREA TWICE A DAY     Not Delegated - Dermatology:  Corticosteroids Failed - 03/17/2023  1:26 PM      Failed - This refill cannot be delegated      Passed - Valid encounter within last 12 months    Recent Outpatient Visits           3 days ago Type 2 diabetes mellitus with peripheral neuropathy (HCC)   [place] [facility] [place]. [facility] Jonah Blue B, MD   4 months ago Type 2 diabetes mellitus with peripheral neuropathy [facility])   [place] [facility] Of [place]. [facility] Jonah Blue B, MD   8 months ago Type 2 diabetes mellitus with peripheral neuropathy [facility])   [place] [facility] - A Dept Of [place]. [facility] Marcine Matar, MD   1 year ago Pap smear for cervical cancer screening   [place] [facility] - A Dept Of [place]. [facility] Marcine Matar, MD   1 year ago Hypertension associated with type 2 diabetes mellitus [facility])    [facility] Dept Of [place]. [facility] Marcine Matar, MD       Future Appointments             In 3 months Laural Benes Binnie Rail, MD [facility] - A Dept Of [place]igha [facility]ideg[facility]. [facility]

## 2023-03-18 NOTE — Telephone Encounter (Signed)
Form successfully faxed to [employer] on 03/18/2023.

## 2023-05-10 ENCOUNTER — Other Ambulatory Visit: Payer: Self-pay | Admitting: Internal Medicine

## 2023-05-10 DIAGNOSIS — E039 Hypothyroidism, unspecified: Secondary | ICD-10-CM

## 2023-06-20 ENCOUNTER — Other Ambulatory Visit: Payer: Self-pay

## 2023-06-20 ENCOUNTER — Telehealth: Payer: Self-pay | Admitting: Internal Medicine

## 2023-06-20 MED ORDER — INSULIN GLARGINE SOLOSTAR 100 UNIT/ML ~~LOC~~ SOPN: 14.0000 [IU] | PEN_INJECTOR | Freq: Every day | SUBCUTANEOUS | 3 refills | Status: DC

## 2023-06-20 MED ORDER — ACCU-CHEK GUIDE TEST VI STRP
ORAL_STRIP | Freq: Three times a day (TID) | 11 refills | Status: DC
Start: 2022-11-09 — End: 2023-12-14
  Filled 2023-07-05: qty 100, 33d supply, fill #0

## 2023-06-20 MED ORDER — EMPAGLIFLOZIN 25 MG PO TABS
25.0000 mg | ORAL_TABLET | Freq: Every day | ORAL | 2 refills | Status: DC
  Filled 2023-07-05 – 2023-07-27 (×2): qty 90, 90d supply, fill #0

## 2023-06-20 NOTE — Telephone Encounter (Signed)
 I spoke to [employer], RN/CAP-DA.  She explained that the patient looks well and is well taken care of.  She said that the family is having a hard time with transportation to pick up medications at the pharmacy. April Mcmahon thought they would benefit from home delivery. I told her that [facility] will deliver meds but they may need to have a credit card on file for co-pays.  I told her that I would discuss this with our clinical pharmacist and can request the transfer of prescriptions.  She was in agreement with that plan.   Message sent to April Mcmahon, [facility] requesting assistance with transferring medications to the pharmacy at [facility]. The signed medication report from CAP-DA was emailed to [place].  April Mcmahon went on to explain that the patient ambulates with her walker but she is not able to get into the bathroom with it, so she is requesting a BSC.  I explained that the provider will need to have documentation supporting the need for the [facility] so that an be addressed at her upcoming appointment on 07/12/2023.

## 2023-06-20 NOTE — Telephone Encounter (Signed)
-----   Message from Burnett Carson sent at 06/16/2023  6:42 PM EDT ----- Regarding: CAP/DA I will call the CAP caseworker about the need for encounter notes for the bedside commode and I will also talk to her about medication delivery options

## 2023-07-01 ENCOUNTER — Encounter: Payer: Self-pay | Admitting: Internal Medicine

## 2023-07-01 DIAGNOSIS — Z1231 Encounter for screening mammogram for malignant neoplasm of breast: Secondary | ICD-10-CM

## 2023-07-05 ENCOUNTER — Other Ambulatory Visit (HOSPITAL_COMMUNITY): Payer: Self-pay

## 2023-07-05 ENCOUNTER — Other Ambulatory Visit: Payer: Self-pay

## 2023-07-12 ENCOUNTER — Ambulatory Visit: Payer: Medicaid Other | Admitting: Internal Medicine

## 2023-07-12 ENCOUNTER — Ambulatory Visit: Payer: Self-pay | Attending: Internal Medicine | Admitting: Internal Medicine

## 2023-07-12 ENCOUNTER — Encounter: Payer: Self-pay | Admitting: Internal Medicine

## 2023-07-12 VITALS — BP 128/72 | HR 77 | Temp 97.9°F | Ht 60.0 in | Wt 145.0 lb

## 2023-07-12 DIAGNOSIS — J302 Other seasonal allergic rhinitis: Secondary | ICD-10-CM

## 2023-07-12 DIAGNOSIS — I152 Hypertension secondary to endocrine disorders: Secondary | ICD-10-CM

## 2023-07-12 DIAGNOSIS — E559 Vitamin D deficiency, unspecified: Secondary | ICD-10-CM | POA: Insufficient documentation

## 2023-07-12 DIAGNOSIS — E1142 Type 2 diabetes mellitus with diabetic polyneuropathy: Secondary | ICD-10-CM | POA: Diagnosis not present

## 2023-07-12 DIAGNOSIS — J069 Acute upper respiratory infection, unspecified: Secondary | ICD-10-CM | POA: Diagnosis not present

## 2023-07-12 DIAGNOSIS — H029 Unspecified disorder of eyelid: Secondary | ICD-10-CM

## 2023-07-12 DIAGNOSIS — Z7984 Long term (current) use of oral hypoglycemic drugs: Secondary | ICD-10-CM | POA: Insufficient documentation

## 2023-07-12 DIAGNOSIS — M722 Plantar fascial fibromatosis: Secondary | ICD-10-CM | POA: Diagnosis not present

## 2023-07-12 DIAGNOSIS — M170 Bilateral primary osteoarthritis of knee: Secondary | ICD-10-CM | POA: Insufficient documentation

## 2023-07-12 DIAGNOSIS — Z794 Long term (current) use of insulin: Secondary | ICD-10-CM | POA: Diagnosis not present

## 2023-07-12 DIAGNOSIS — E119 Type 2 diabetes mellitus without complications: Secondary | ICD-10-CM

## 2023-07-12 DIAGNOSIS — K760 Fatty (change of) liver, not elsewhere classified: Secondary | ICD-10-CM | POA: Insufficient documentation

## 2023-07-12 DIAGNOSIS — Z79899 Other long term (current) drug therapy: Secondary | ICD-10-CM | POA: Insufficient documentation

## 2023-07-12 DIAGNOSIS — Z79890 Hormone replacement therapy (postmenopausal): Secondary | ICD-10-CM | POA: Insufficient documentation

## 2023-07-12 DIAGNOSIS — E1140 Type 2 diabetes mellitus with diabetic neuropathy, unspecified: Secondary | ICD-10-CM | POA: Insufficient documentation

## 2023-07-12 DIAGNOSIS — G43009 Migraine without aura, not intractable, without status migrainosus: Secondary | ICD-10-CM | POA: Insufficient documentation

## 2023-07-12 DIAGNOSIS — E039 Hypothyroidism, unspecified: Secondary | ICD-10-CM | POA: Insufficient documentation

## 2023-07-12 DIAGNOSIS — E785 Hyperlipidemia, unspecified: Secondary | ICD-10-CM | POA: Insufficient documentation

## 2023-07-12 DIAGNOSIS — R519 Headache, unspecified: Secondary | ICD-10-CM | POA: Diagnosis present

## 2023-07-12 DIAGNOSIS — M4802 Spinal stenosis, cervical region: Secondary | ICD-10-CM | POA: Insufficient documentation

## 2023-07-12 DIAGNOSIS — J301 Allergic rhinitis due to pollen: Secondary | ICD-10-CM | POA: Diagnosis not present

## 2023-07-12 DIAGNOSIS — Z87891 Personal history of nicotine dependence: Secondary | ICD-10-CM | POA: Insufficient documentation

## 2023-07-12 DIAGNOSIS — I10 Essential (primary) hypertension: Secondary | ICD-10-CM | POA: Insufficient documentation

## 2023-07-12 LAB — POCT GLYCOSYLATED HEMOGLOBIN (HGB A1C): HbA1c, POC (controlled diabetic range): 6.5 % (ref 0.0–7.0)

## 2023-07-12 LAB — GLUCOSE, POCT (MANUAL RESULT ENTRY): POC Glucose: 175 mg/dL — AB (ref 70–99)

## 2023-07-12 LAB — POC COVID19/FLU A&B COMBO
Covid Antigen, POC: NEGATIVE
Influenza A Antigen, POC: NEGATIVE
Influenza B Antigen, POC: NEGATIVE

## 2023-07-12 MED ORDER — KETOROLAC TROMETHAMINE 30 MG/ML IJ SOLN
30.0000 mg | Freq: Once | INTRAMUSCULAR | Status: AC
Start: 2023-07-12 — End: 2023-07-12
  Administered 2023-07-12: 30 mg via INTRAMUSCULAR

## 2023-07-12 MED ORDER — UBRELVY 50 MG PO TABS
ORAL_TABLET | ORAL | 1 refills | Status: AC
Start: 2023-07-12 — End: ?

## 2023-07-12 MED ORDER — LORATADINE 10 MG PO TABS
10.0000 mg | ORAL_TABLET | Freq: Every day | ORAL | 0 refills | Status: DC | PRN
Start: 2023-07-12 — End: 2023-08-09

## 2023-07-12 NOTE — Progress Notes (Signed)
 Patient ID: April Mcmahon, female    DOB: [birth year]  MRN: [id number]  CC: Diabetes (DM f/u. April Mcmahon headache x4 days, runny nose)   Subjective: April Mcmahon is a 63 y.o. female who presents for chronic ds management.  Daughter-in-law, Chapman Commodore. Is with her  Her concerns today include:  Pt with hx of DM neuropathy, HTN, HL, former tob dep, migraines, Vit D def, DJD knees and plantar fasciitis, Abn LFT with steatosis on U/S, BL CTS, hypothyroid, spinal stenosis cervical spine   [employer] Language interpreter used during this encounter. #[id number]   Discussed the use of AI scribe software for clinical note transcription with the patient, who gave verbal consent to proceed.  History of Present Illness April Mcmahon is a 63 year old female with a history of migraines who presents with headache, runny nose, and cough.  She has experienced a diffuse headache for the past three to four days, associated with blurred vision and photophobia. Tylenol  2 tabs TID provides some relief, but significant discomfort persists without it. The headache is similar to her previous migraines. There is no nausea, vomiting, or neck pain.  Sumatriptan  prescribed in the past but she has been out of those for a while.  Did find those helpful when she had migraine headaches.  Concurrently, she has a runny nose and dry cough, which began with the headache. There is nasal congestion and frequent sneezing, but no productive cough, sore throat, itchy eyes, or throat. No fever.  Her medical history includes diabetes type 2, hypertension, hypothyroidism, and hyperlipidemia, managed with Lantus  14 units daily, Metformin  1 gram BID, Jardiance  25 mg daily, Valsartan  40 mg, Amlodipine  10 mg, Levothyroxine , and Atorvastatin  20 mg all daily.  She reports compliance with her medications.  Blood sugar levels are stable b/w 100-102, no lows.  Eating habits are good.  Systolic blood pressure at home ranges between 100-110.  She limits  salt in the food. TSH normal in February. Due for diabetic eye exam.  She has small black pimple on the right lower eyelid which she states she has had since childhood and has not changed in size.  She states that the ophthalmologist told her previously that she should consider having it removed.  She does not want to have it removed because it is not bothersome and has not changed. Results for orders placed or performed in visit on 07/12/23  POCT glucose (manual entry)   Collection Time: 07/12/23 10:23 AM  Result Value Ref Range   POC Glucose 175 (A) 70 - 99 mg/dl  POCT glycosylated hemoglobin (Hb A1C)   Collection Time: 07/12/23 10:28 AM  Result Value Ref Range   Hemoglobin A1C     HbA1c POC (<> result, manual entry)     HbA1c, POC (prediabetic range)     HbA1c, POC (controlled diabetic range) 6.5 0.0 - 7.0 %  POC Covid19/Flu A&B Antigen   Collection Time: 07/12/23  3:27 PM  Result Value Ref Range   Influenza A Antigen, POC Negative Negative   Influenza B Antigen, POC Negative Negative   Covid Antigen, POC Negative Negative       Patient Active Problem List   Diagnosis Date Noted   Contusion of right foot 10/30/2021   Viral URI with cough 10/30/2021   History of diabetes mellitus, type II 10/30/2021   History of hypertension 10/30/2021   Hyperglycemia 10/30/2021   Vitamin B12 deficiency 09/24/2021   DM type 2 with diabetic peripheral neuropathy (HCC) 06/15/2021  Dizziness 06/15/2021   Dyslipidemia 10/17/2020   Uncontrolled type 2 diabetes mellitus with peripheral neuropathy 10/17/2020   Hyperlipidemia associated with type 2 diabetes mellitus (HCC) 05/05/2020   Influenza vaccine needed 01/03/2020   Former smoker 01/03/2020   Acquired hypothyroidism 01/03/2020   Microalbuminuria 11/15/2018   Closed fracture of distal phalanx of great toe 08/23/2018   Muscle cramps 02/21/2018   Hepatic steatosis 12/23/2017   MCI (mild cognitive impairment) 02/14/2017   Diabetic  peripheral neuropathy (HCC) 02/14/2017   Memory loss 12/27/2016   Dysfunctional uterine bleeding 08/30/2016   Analgesic rebound headache 06/24/2016   Esophageal dysphagia 06/21/2016   Tobacco dependence 01/20/2016   Plantar fasciitis, bilateral 12/24/2015   DJD (degenerative joint disease) of knee 06/20/2015   Type 2 diabetes mellitus with peripheral neuropathy (HCC) 06/19/2015   Essential hypertension 06/19/2015   Vitamin D  deficiency 09/24/2013   Migraine 05/15/2013   Low back pain 08/18/2012     Current Outpatient Medications on File Prior to Visit  Medication Sig Dispense Refill   Accu-Chek Softclix Lancets lancets Use as instructed to Check blood sugar three times daily.  E11.42 100 each 12   amLODipine  (NORVASC ) 10 MG tablet Take 1 tablet (10 mg total) by mouth daily. 90 tablet 1   atorvastatin  (LIPITOR) 20 MG tablet Take 1 tablet (20 mg total) by mouth daily. 90 tablet 2   Blood Glucose Monitoring Suppl (ACCU-CHEK GUIDE ME) w/Device KIT Check blood sugar three times daily. Dx E11.42 1 kit 0   empagliflozin  (JARDIANCE ) 25 MG TABS tablet Take 1 tablet (25 mg total) by mouth daily before breakfast. 90 tablet 2   empagliflozin  (JARDIANCE ) 25 MG TABS tablet Take 1 tablet (25 mg total) by mouth daily before breakfast. 90 tablet 2   fluticasone  (FLONASE ) 50 MCG/ACT nasal spray Place 2 sprays into both nostrils daily. 16 g 0   glucose blood (ACCU-CHEK GUIDE TEST) test strip Test blood sugar 3 (three) times daily before meals. 100 each 11   glucose blood (ACCU-CHEK GUIDE) test strip Check BS three times a day before meals.  E11.42 100 strip 11   insulin  glargine (LANTUS  SOLOSTAR) 100 UNIT/ML Solostar Pen Inject 14 Units into the skin daily. 15 mL 2   Insulin  Glargine Solostar (LANTUS ) 100 UNIT/ML Solostar Pen Inject 14 Units into the skin daily. 15 mL 3   Insulin  Pen Needle (BD PEN NEEDLE NANO 2ND GEN) 32G X 4 MM MISC 1 Device by Other route daily in the afternoon. 100 each 3    levothyroxine  (SYNTHROID ) 50 MCG tablet Take 1 tablet (50 mcg total) by mouth daily. 90 tablet 1   Meloxicam  15 MG TBDP Take 15 mg by mouth daily. 30 tablet 2   metFORMIN  (GLUCOPHAGE ) 1000 MG tablet Take 1 tablet (1,000 mg total) by mouth 2 (two) times daily with a meal. 180 tablet 2   triamcinolone  (KENALOG ) 0.025 % ointment APPLY TO AFFECTED AREA TWICE A DAY 30 g 0   valsartan  (DIOVAN ) 40 MG tablet Take 0.5 tablets (20 mg total) by mouth daily. 90 tablet 1   gabapentin  (NEURONTIN ) 600 MG tablet Take 0.5 tablets (300 mg total) by mouth 3 (three) times daily. 45 tablet 1   [DISCONTINUED] venlafaxine  (EFFEXOR ) 37.5 MG tablet Take 1 tablet (37.5 mg total) by mouth 2 (two) times daily with a meal. 60 tablet 11   No current facility-administered medications on file prior to visit.    Allergies  Allergen Reactions   Cymbalta  [Duloxetine  Hcl]     Nausea  and dizziness   Elavil  [Amitriptyline  Hcl] Diarrhea and Nausea And Vomiting    Social History   Socioeconomic History   Marital status: Married    Spouse name: Not on file   Number of children: Not on file   Years of education: Not on file   Highest education level: Not on file  Occupational History   Not on file  Tobacco Use   Smoking status: Never   Smokeless tobacco: Never  Vaping Use   Vaping status: Never Used  Substance and Sexual Activity   Alcohol use: No   Drug use: No   Sexual activity: Not on file  Other Topics Concern   Not on file  Social History Narrative   Not on file   Social Drivers of Health   Financial Resource Strain: Not on file  Food Insecurity: Food Insecurity Present (03/14/2023)   Hunger Vital Sign    Worried About Running Out of Food in the Last Year: Sometimes true    Ran Out of Food in the Last Year: Sometimes true  Transportation Needs: No Transportation Needs (03/14/2023)   PRAPARE - Administrator, Civil Service (Medical): No    Lack of Transportation (Non-Medical): No  Physical  Activity: Not on file  Stress: Not on file  Social Connections: Not on file  Intimate Partner Violence: Not At Risk (03/14/2023)   Humiliation, Afraid, Rape, and Kick questionnaire    Fear of Current or Ex-Partner: No    Emotionally Abused: No    Physically Abused: No    Sexually Abused: No    Family History  Problem Relation Age of Onset   Migraines Mother    Esophageal cancer Other    Breast cancer Neg Hx    Colon cancer Neg Hx    Rectal cancer Neg Hx    Stomach cancer Neg Hx     Past Surgical History:  Procedure Laterality Date   no previous surgeries      ROS: Review of Systems Negative except as stated above  PHYSICAL EXAM: BP 128/72 (BP Location: Left Arm, Patient Position: Sitting, Cuff Size: Normal)   Pulse 77   Temp 97.9 F (36.6 C) (Oral)   Ht 5' (1.524 m)   Wt 145 lb (65.8 kg)   SpO2 97%   BMI 28.32 kg/m   Physical Exam  General appearance - alert, well appearing, and in no distress.  She is sniffling from runny nose and my presence and occasional cough Mental status - normal mood, behavior, speech, dress, motor activity, and thought processes Nose -mild enlargement of the nasal turbinates left greater than right. Mouth - mucous membranes moist, pharynx normal without lesions Neck - supple, no significant adenopathy Chest - clear to auscultation, no wheezes, rales or rhonchi, symmetric air entry Heart - normal rate, regular rhythm, normal S1, S2, no murmurs, rubs, clicks or gallops Neurological - cranial nerves II through XII intact, motor and sensory grossly normal bilaterally, Romberg sign negative, normal gait and station.  No dysmetria on finger-to-nose Extremities - peripheral pulses normal, no pedal edema, no clubbing or cyanosis Skin: Very fine hyperpigmented spot noted on the right lower lid more medially.     Latest Ref Rng & Units 03/14/2023    4:52 PM 02/02/2022    2:51 PM 07/08/2021    2:13 PM  CMP  Glucose 70 - 99 mg/dL 161  096  045   BUN  8 - 27 mg/dL 13  17  10  Creatinine 0.57 - 1.00 mg/dL 1.61  0.96  0.45   Sodium 134 - 144 mmol/L 142  145  140   Potassium 3.5 - 5.2 mmol/L 4.7  4.2  3.9   Chloride 96 - 106 mmol/L 104  108  111   CO2 20 - 29 mmol/L 21  22  23    Calcium  8.7 - 10.3 mg/dL 9.8  9.4  9.3   Total Protein 6.0 - 8.5 g/dL 7.5  7.5    Total Bilirubin 0.0 - 1.2 mg/dL 0.3  0.3    Alkaline Phos 44 - 121 IU/L 133  119    AST 0 - 40 IU/L 18  18    ALT 0 - 32 IU/L 20  18     Lipid Panel     Component Value Date/Time   CHOL 135 03/14/2023 1652   TRIG 338 (H) 03/14/2023 1652   HDL 55 03/14/2023 1652   CHOLHDL 2.5 03/14/2023 1652   CHOLHDL 3.3 10/20/2015 1625   VLDL 46 (H) 10/20/2015 1625   LDLCALC 31 03/14/2023 1652    CBC    Component Value Date/Time   WBC 7.1 03/14/2023 1652   WBC 9.7 10/10/2017 1941   RBC 4.91 03/14/2023 1652   RBC 4.81 10/10/2017 1941   HGB 12.4 03/14/2023 1652   HCT 39.4 03/14/2023 1652   PLT 292 03/14/2023 1652   MCV 80 03/14/2023 1652   MCH 25.3 (L) 03/14/2023 1652   MCH 26.4 10/10/2017 1941   MCHC 31.5 03/14/2023 1652   MCHC 31.0 10/10/2017 1941   RDW 14.0 03/14/2023 1652   LYMPHSABS 3.2 (H) 06/15/2021 1615   MONOABS 335 10/20/2015 1625   EOSABS 0.2 06/15/2021 1615   BASOSABS 0.0 06/15/2021 1615   Results for orders placed or performed in visit on 07/12/23  POCT glucose (manual entry)   Collection Time: 07/12/23 10:23 AM  Result Value Ref Range   POC Glucose 175 (A) 70 - 99 mg/dl  POCT glycosylated hemoglobin (Hb A1C)   Collection Time: 07/12/23 10:28 AM  Result Value Ref Range   Hemoglobin A1C     HbA1c POC (<> result, manual entry)     HbA1c, POC (prediabetic range)     HbA1c, POC (controlled diabetic range) 6.5 0.0 - 7.0 %  POC Covid19/Flu A&B Antigen   Collection Time: 07/12/23  3:27 PM  Result Value Ref Range   Influenza A Antigen, POC Negative Negative   Influenza B Antigen, POC Negative Negative   Covid Antigen, POC Negative Negative    ASSESSMENT AND  PLAN: 1. Migraine without aura and without status migrainosus, not intractable Will give shot of Toradol  today.  Advised to not take meloxicam  for the next 24 to 48 hours. Given her cardiovascular risk factors, I recommend trying her with Ubrelvy instead of sumatriptan .  Advised that the Florette Hurry can cause elevation in blood pressure.  Advised to check blood pressure for few days after each use.  Stop Tylenol . - ketorolac  (TORADOL ) 30 MG/ML injection 30 mg - Ubrogepant (UBRELVY) 50 MG TABS; 1 tab PO at start of headache. May repeat in 2 hrs if no relief.  Max 2 tabs/24 hrs.  Dispense: 12 tablet; Refill: 1  2. Type 2 diabetes mellitus with peripheral neuropathy (HCC) (Primary) At goal.  Continue Lantus  14 units daily, metformin  1000 mg twice a day and Jardiance  25 mg daily.  Continue healthy eating habits - Ambulatory referral to Ophthalmology  3. Diabetes mellitus treated with oral medication (HCC) See #2 above -  POCT glycosylated hemoglobin (Hb A1C) - POCT glucose (manual entry)  4. Insulin  long-term use (HCC) See #2 above  5. Hypertension associated with type 2 diabetes mellitus (HCC) At goal.  Continue Diovan  40 mg daily and amlodipine  10 mg daily  6. Acquired hypothyroidism Stable.  Continue levothyroxine   7. Seasonal allergic rhinitis due to pollen Symptoms seem more consistent with allergies.  Refill given on Claritin . - loratadine  (CLARITIN ) 10 MG tablet; Take 1 tablet (10 mg total) by mouth daily as needed for allergies.  Dispense: 30 tablet; Refill: 0  8. Upper respiratory infection, acute Screen for COVID and flu negative. Symptoms seem more consistent with allergies.  Refill given on Claritin .  9.  Right lower eyelid lesion -Patient reports she has had this all of her life and has not changed in size and is not bothersome to her.  Advised patient that if it does increase in size then she should definitely have it removed.  This documentation was completed using Dietitian.  Any transcriptional errors are unintentional.  Orders Placed This Encounter  Procedures   Ambulatory referral to Ophthalmology   POCT glycosylated hemoglobin (Hb A1C)   POCT glucose (manual entry)   POC Covid19/Flu A&B Antigen     Requested Prescriptions   Signed Prescriptions Disp Refills   loratadine  (CLARITIN ) 10 MG tablet 30 tablet 0    Sig: Take 1 tablet (10 mg total) by mouth daily as needed for allergies.   Ubrogepant (UBRELVY) 50 MG TABS 12 tablet 1    Sig: 1 tab PO at start of headache. May repeat in 2 hrs if no relief.  Max 2 tabs/24 hrs.    Return in about 4 months (around 11/11/2023).  Concetta Dee, MD, FACP

## 2023-07-12 NOTE — Patient Instructions (Signed)
 VISIT SUMMARY:  Today, you were seen for a headache, runny nose, and cough. You have been experiencing a headache for the past few days, which is similar to your previous migraines. You also have a runny nose and dry cough that started with the headache. Your medical history includes diabetes, hypertension, hypothyroidism, and hyperlipidemia, all of which are well-managed with your current medications.  YOUR PLAN:  -MIGRAINE: A migraine is a type of headache that can cause severe pain, often accompanied by blurred vision and sensitivity to light. You received a Toradol  injection for immediate relief and were prescribed Ubrelvy to take at the onset of a migraine, with a maximum of two tablets in 24 hours. Please monitor your blood pressure after taking Ubrelvy and limit its use to no more thantwice a week. PLEASE CALL to FOLLOW UP WITH ME IF HEADACHE DOES NOR RESOLVE IN 2 days.  -ALLERGIC RHINITIS: Allergic rhinitis is an allergic reaction that causes sneezing, runny nose, and nasal congestion. You were prescribed loratadine  to take daily for seven days, then as needed.  -TYPE 2 DIABETES MELLITUS: Type 2 diabetes is a condition that affects the way your body processes blood sugar. Your diabetes is well-controlled with an A1c of 6.5%. Continue taking Lantus  40 units daily, metformin  1000 mg twice daily, and Jardiance  25 mg daily. You have been referred for a diabetic eye exam.  -HYPERTENSION: Hypertension is high blood pressure. Your blood pressure is well-controlled with your current medications. Continue taking valsartan  and amlodipine  as prescribed.  -HYPOTHYROIDISM: Hypothyroidism is a condition where your thyroid  gland doesn't produce enough thyroid  hormone. Your condition is well-managed with normal thyroid  levels. Continue taking levothyroxine  as prescribed.  -HYPERLIPIDEMIA: Hyperlipidemia is having high levels of fats in the blood. You are tolerating atorvastatin  well with no new concerns.  Continue taking atorvastatin  as prescribed.  -BENIGN SKIN LESION: A benign skin lesion is a non-cancerous growth on the skin. You have a small mole near your right eye that is unchanged and asymptomatic. Monitor it for any changes in size or symptoms, and consider removal if changes occur.  INSTRUCTIONS:  Please follow up with a diabetic eye exam as referred. Monitor your blood pressure after taking Ubrelvy and limit its use to twice a week. Continue with your current medications for diabetes, hypertension, hypothyroidism, and hyperlipidemia as prescribed. If you notice any changes in the mole near your right eye, please contact us .

## 2023-07-27 ENCOUNTER — Other Ambulatory Visit (HOSPITAL_COMMUNITY): Payer: Self-pay

## 2023-07-27 ENCOUNTER — Other Ambulatory Visit: Payer: Self-pay

## 2023-08-01 LAB — HM DIABETES EYE EXAM

## 2023-08-02 ENCOUNTER — Ambulatory Visit: Payer: Self-pay | Admitting: Internal Medicine

## 2023-08-09 ENCOUNTER — Other Ambulatory Visit: Payer: Self-pay | Admitting: Internal Medicine

## 2023-08-09 DIAGNOSIS — J301 Allergic rhinitis due to pollen: Secondary | ICD-10-CM

## 2023-08-10 ENCOUNTER — Ambulatory Visit: Payer: Medicaid Other | Admitting: Internal Medicine

## 2023-08-10 NOTE — Progress Notes (Deleted)
 Name: April Mcmahon  MRN/ DOB: [id number], Aug 21, 1960   Age/ Sex: 63 y.o., female    PCP: Vicci Barnie NOVAK, MD   Reason for Endocrinology Evaluation: Type 2 Diabetes Mellitus     Date of Initial Endocrinology Visit: 10/17/2020    PATIENT IDENTIFIER: April Mcmahon is a 63 y.o. female with a past medical history of T2DM, HTN and Dyslipidemia . The patient presented for initial endocrinology clinic visit on 10/17/2020 for consultative assistance with her diabetes management.     DIABETIC HISTORY:  April Mcmahon  was diagnosed with DM yrs ago. Her  hemoglobin A1c has ranged from 6.3% in 2018, peaking at 10.3% in 2022.  On her initial visit to our clinic she had an A1c of 11.3%, she was on glipizide, Jardiance , and metformin  as well as basal insulin .  We stopped glimepiride .  Continued metformin , increase Jardiance , and switch Levemir  to Lantus    SUBJECTIVE:   During the last visit (02/10/2023): A1c 7.6 %      Today (08/10/23): April Mcmahon is here for a follow up on diabetes management.  She is accompanied by her daughter In-Law . She  checks her  blood sugars 2 times daily. The patient has not had hypoglycemic episodes since the last clinic visit.    She is accompanied by her daughter in-law   Denies nausea or vomiting  Denies constipation or diarrhea    HOME Endocrine  REGIMEN: Jardiance  25 mg daily  Lantus  14 units daily  Metformin  1000 mg BID Atorvastatin  20 mg daily    Statin: yes ACE-I/ARB: yes Prior Diabetic Education: no   METER DOWNLOAD SUMMARY: unable to download 90 day average 127 mg/dL    17-769 mg/dL    DIABETIC COMPLICATIONS: Microvascular complications:  Neuropathy  Denies: CKD, retinopathy  Last eye exam: Completed 2022  Macrovascular complications:   Denies: CAD, PVD, CVA   PAST HISTORY: Past Medical History:  Past Medical History:  Diagnosis Date   Bilateral chronic knee pain 06/2014   Chronic low back pain 06/2014   Chronic  migraine 02/1985   Depression    hx of   Diabetes mellitus without complication (HCC) 11/2014   Fall    Hyperlipidemia    Hypertension 09/2013   Hypothyroidism    Shoulder pain    Past Surgical History:  Past Surgical History:  Procedure Laterality Date   no previous surgeries      Social History:  reports that she has never smoked. She has never used smokeless tobacco. She reports that she does not drink alcohol and does not use drugs. Family History:  Family History  Problem Relation Age of Onset   Migraines Mother    Esophageal cancer Other    Breast cancer Neg Hx    Colon cancer Neg Hx    Rectal cancer Neg Hx    Stomach cancer Neg Hx      HOME MEDICATIONS: Allergies as of 08/10/2023       Reactions   Cymbalta  [duloxetine  Hcl]    Nausea and dizziness   Elavil  [amitriptyline  Hcl] Diarrhea, Nausea And Vomiting        Medication List        Accurate as of August 10, 2023  7:28 AM. If you have any questions, ask your nurse or doctor.          Accu-Chek Guide Me w/Device Kit Check blood sugar three times daily. Dx E11.42   Accu-Chek Guide test strip Generic drug: glucose blood Check BS  three times a day before meals.  E11.42   Accu-Chek Guide Test test strip Generic drug: glucose blood Test blood sugar 3 (three) times daily before meals.   Accu-Chek Softclix Lancets lancets Use as instructed to Check blood sugar three times daily.  E11.42 (Use as instructed to Check blood sugar three times daily.  E11.42)   amLODipine  10 MG tablet Commonly known as: NORVASC  Take 1 tablet (10 mg total) by mouth daily.   atorvastatin  20 MG tablet Commonly known as: LIPITOR Take 1 tablet (20 mg total) by mouth daily.   BD Pen Needle Nano 2nd Gen 32G X 4 MM Misc Generic drug: Insulin  Pen Needle 1 Device by Other route daily in the afternoon.   empagliflozin  25 MG Tabs tablet Commonly known as: Jardiance  Take 1 tablet (25 mg total) by mouth daily before breakfast.    empagliflozin  25 MG Tabs tablet Commonly known as: JARDIANCE  Take 1 tablet (25 mg total) by mouth daily before breakfast. (Take 1 tablet (25 mg total) by mouth daily before breakfast.)   fluticasone  50 MCG/ACT nasal spray Commonly known as: FLONASE  Place 2 sprays into both nostrils daily.   gabapentin  600 MG tablet Commonly known as: Neurontin  Take 0.5 tablets (300 mg total) by mouth 3 (three) times daily.   Insulin  Glargine Solostar 100 UNIT/ML Solostar Pen Commonly known as: LANTUS  Inject 14 Units into the skin daily.   Lantus  SoloStar 100 UNIT/ML Solostar Pen Generic drug: insulin  glargine Inject 14 Units into the skin daily.   levothyroxine  50 MCG tablet Commonly known as: SYNTHROID  Take 1 tablet (50 mcg total) by mouth daily.   loratadine  10 MG tablet Commonly known as: CLARITIN  TAKE 1 TABLET BY MOUTH EVERY DAY AS NEEDED FOR ALLERGY   Meloxicam  15 MG Tbdp Take 15 mg by mouth daily.   metFORMIN  1000 MG tablet Commonly known as: GLUCOPHAGE  Take 1 tablet (1,000 mg total) by mouth 2 (two) times daily with a meal.   triamcinolone  0.025 % ointment Commonly known as: KENALOG  APPLY TO AFFECTED AREA TWICE A DAY   Ubrelvy  50 MG Tabs Generic drug: Ubrogepant  1 tab PO at start of headache. May repeat in 2 hrs if no relief.  Max 2 tabs/24 hrs.   valsartan  40 MG tablet Commonly known as: DIOVAN  Take 0.5 tablets (20 mg total) by mouth daily.         ALLERGIES: Allergies  Allergen Reactions   Cymbalta  [Duloxetine  Hcl]     Nausea and dizziness   Elavil  [Amitriptyline  Hcl] Diarrhea and Nausea And Vomiting        OBJECTIVE:   VITAL SIGNS: There were no vitals taken for this visit.   PHYSICAL EXAM:  General: Pt appears well and is in NAD  Lungs: Clear with good BS bilat   Heart: RRR   Extremities:  Lower extremities - No pretibial edema  Neuro: MS is good with appropriate affect, pt is alert and Ox3    DM Foot Exam 02/10/2023  The skin of the feet is  intact without sores or ulcerations. The pedal pulses are 2+ on right and 2+ on left. The sensation is intact to a screening 5.07, 10 gram monofilament bilaterally     DATA REVIEWED:  Lab Results  Component Value Date   HGBA1C 6.5 07/12/2023   HGBA1C 6.7 (A) 02/10/2023   HGBA1C 7.3 (A) 11/08/2022    Latest Reference Range & Units 03/14/23 16:52  Comprehensive metabolic panel with GFR  Rpt !  Sodium 134 - 144 mmol/L  142  Potassium 3.5 - 5.2 mmol/L 4.7  Chloride 96 - 106 mmol/L 104  CO2 20 - 29 mmol/L 21  Glucose 70 - 99 mg/dL 886 (H)  BUN 8 - 27 mg/dL 13  Creatinine 9.42 - 8.99 mg/dL 9.41  Calcium  8.7 - 10.3 mg/dL 9.8  BUN/Creatinine Ratio 12 - 28  22  eGFR >59 mL/min/1.73 103  Alkaline Phosphatase 44 - 121 IU/L 133 (H)  Albumin 3.9 - 4.9 g/dL 4.6  AST 0 - 40 IU/L 18  ALT 0 - 32 IU/L 20  Total Protein 6.0 - 8.5 g/dL 7.5  Total Bilirubin 0.0 - 1.2 mg/dL 0.3    Latest Reference Range & Units 03/14/23 16:52  Total CHOL/HDL Ratio 0.0 - 4.4 ratio 2.5  Cholesterol, Total 100 - 199 mg/dL 864  HDL Cholesterol >60 mg/dL 55  Triglycerides 0 - 850 mg/dL 661 (H)  VLDL Cholesterol Cal 5 - 40 mg/dL 49 (H)  LDL Chol Calc (NIH) 0 - 99 mg/dL 31   ASSESSMENT / PLAN / RECOMMENDATIONS:   1) Type 2 Diabetes Mellitus, optimally controlled, With neuropathic  complications - Most recent A1c of 6.5 %. Goal A1c < 7.0 %.     - A1  at goal  - Pt with language barrier - No changes  -The patient was supposed to have labs drawn today, but it appears this was not done at checkout  MEDICATIONS:  -Continue Jardiance   25 mg daily  -Continue Metformin  1000 mg , twice a day  -Continue  Lantus  14 units daily   EDUCATION / INSTRUCTIONS: BG monitoring instructions: Patient is instructed to check her blood sugars 3 times a day, before meals . Call [facility] if: BG persistently < 70  I reviewed the Rule of 15 for the treatment of hypoglycemia in detail with the patient.  Literature supplied.   2) Diabetic complications:  Eye: Does not have known diabetic retinopathy.  Neuro/ Feet: Does  have known diabetic peripheral neuropathy. Renal: Patient does not have known baseline CKD. She is  on an ACEI/ARB at present.     3) Dyslipidemia:  -We had increased her atorvastatin  due to LDL above goal at 109 mg/DL in 02/7975 -Patient was supposed to have lipid panel drawn today but it appears this was not done   Medication   Continue atorvastatin  20 mg daily   F/U in 6 months     Signed electronically by: Stefano Redgie Butts, MD  [facility]  [facility] [address] [place] [place], [place] [address] Phone: [phone] FAX: [phone]   CC: Vicci Barnie NOVAK, MD [address] [place] [address] Phone: [phone]  Fax: [phone]    Return to Endocrinology clinic as below: Future Appointments  Date Time Provider Department Center  08/10/2023 11:10 AM Latrisa Hellums, Donell Redgie, MD [id number] None  11/11/2023  2:10 PM Vicci Barnie NOVAK, MD [facility] None

## 2023-08-15 ENCOUNTER — Other Ambulatory Visit: Payer: Self-pay | Admitting: Internal Medicine

## 2023-08-15 DIAGNOSIS — E039 Hypothyroidism, unspecified: Secondary | ICD-10-CM

## 2023-08-25 ENCOUNTER — Ambulatory Visit (INDEPENDENT_AMBULATORY_CARE_PROVIDER_SITE_OTHER): Admitting: Internal Medicine

## 2023-08-25 ENCOUNTER — Encounter: Payer: Self-pay | Admitting: Internal Medicine

## 2023-08-25 VITALS — BP 134/80 | HR 74 | Ht 60.0 in | Wt 146.0 lb

## 2023-08-25 DIAGNOSIS — E1142 Type 2 diabetes mellitus with diabetic polyneuropathy: Secondary | ICD-10-CM | POA: Diagnosis not present

## 2023-08-25 DIAGNOSIS — E785 Hyperlipidemia, unspecified: Secondary | ICD-10-CM

## 2023-08-25 DIAGNOSIS — E119 Type 2 diabetes mellitus without complications: Secondary | ICD-10-CM | POA: Diagnosis not present

## 2023-08-25 DIAGNOSIS — Z794 Long term (current) use of insulin: Secondary | ICD-10-CM

## 2023-08-25 DIAGNOSIS — Z7984 Long term (current) use of oral hypoglycemic drugs: Secondary | ICD-10-CM

## 2023-08-25 LAB — POCT GLUCOSE (DEVICE FOR HOME USE): POC Glucose: 186 mg/dL — AB (ref 70–99)

## 2023-08-25 MED ORDER — METFORMIN HCL 1000 MG PO TABS: 1000.0000 mg | ORAL_TABLET | Freq: Two times a day (BID) | ORAL | 3 refills | Status: DC

## 2023-08-25 MED ORDER — ATORVASTATIN CALCIUM 20 MG PO TABS: 20.0000 mg | ORAL_TABLET | Freq: Every day | ORAL | 3 refills | Status: DC

## 2023-08-25 MED ORDER — INSULIN GLARGINE SOLOSTAR 100 UNIT/ML ~~LOC~~ SOPN: 14.0000 [IU] | PEN_INJECTOR | Freq: Every day | SUBCUTANEOUS | 3 refills | Status: DC

## 2023-08-25 MED ORDER — BD PEN NEEDLE NANO 2ND GEN 32G X 4 MM MISC: 1.0000 | Freq: Every day | 3 refills | Status: DC

## 2023-08-25 MED ORDER — LANTUS SOLOSTAR 100 UNIT/ML ~~LOC~~ SOPN: 14.0000 [IU] | PEN_INJECTOR | Freq: Every day | SUBCUTANEOUS | 3 refills | Status: DC

## 2023-08-25 MED ORDER — EMPAGLIFLOZIN 25 MG PO TABS: 25.0000 mg | ORAL_TABLET | Freq: Every day | ORAL | 3 refills | Status: DC

## 2023-08-25 NOTE — Patient Instructions (Signed)
-   Continue Jardiance  25 mg daily  - Continue Metformin  1000 mg , twice a day  - Continue Lantus  14 units once daily  - Continue Atorvastatin  to 20 mg daily     HOW TO TREAT LOW BLOOD SUGARS (Blood sugar LESS THAN 70 MG/DL) Please follow the RULE OF 15 for the treatment of hypoglycemia treatment (when your (blood sugars are less than 70 mg/dL)   STEP 1: Take 15 grams of carbohydrates when your blood sugar is low, which includes:  3-4 GLUCOSE TABS  OR 3-4 OZ OF JUICE OR REGULAR SODA OR ONE TUBE OF GLUCOSE GEL    STEP 2: RECHECK blood sugar in 15 MINUTES STEP 3: If your blood sugar is still low at the 15 minute recheck --> then, go back to STEP 1 and treat AGAIN with another 15 grams of carbohydrates.

## 2023-08-25 NOTE — Progress Notes (Signed)
 Name: April Mcmahon  MRN/ DOB: [id number], March 26, 1960   Age/ Sex: 63 y.o., female    PCP: April Barnie NOVAK, MD   Reason for Endocrinology Evaluation: Type 2 Diabetes Mellitus     Date of Initial Endocrinology Visit: 10/17/2020    PATIENT IDENTIFIER: April Mcmahon is a 63 y.o. female with a past medical history of T2DM, HTN and Dyslipidemia . The patient presented for initial endocrinology clinic visit on 10/17/2020 for consultative assistance with her diabetes management.     DIABETIC HISTORY:  April Mcmahon  was diagnosed with DM yrs ago. Her  hemoglobin A1c has ranged from 6.3% in 2018, peaking at 10.3% in 2022.  On her initial visit to our clinic she had an A1c of 11.3%, she was on glipizide, Jardiance , and metformin  as well as basal insulin .  We stopped glimepiride .  Continued metformin , increase Jardiance , and switch Levemir  to Lantus    SUBJECTIVE:   During the last visit (02/10/2023): A1c 6.5%      Today (08/25/23): April Mcmahon is here for a follow up on diabetes management.  She is accompanied by her daughter In-Law . She  checks her  blood sugars 2 times daily. The patient has not had hypoglycemic episodes since the last clinic visit.  She is accompanied by her daughter in-law    Denies nausea or vomiting Denies constipation or diarrhea   HOME Endocrine  REGIMEN: Jardiance  25 mg daily  Lantus  14 units daily  Metformin  1000 mg BID Atorvastatin  20 mg daily    Statin: yes ACE-I/ARB: yes Prior Diabetic Education: no   METER DOWNLOAD SUMMARY: unable to download 90 day average 127 mg/dL    17-769 mg/dL    DIABETIC COMPLICATIONS: Microvascular complications:  Neuropathy  Denies: CKD, retinopathy  Last eye exam: Completed 08/01/2023  Macrovascular complications:   Denies: CAD, PVD, CVA   PAST HISTORY: Past Medical History:  Past Medical History:  Diagnosis Date   Bilateral chronic knee pain 06/2014   Chronic low back pain 06/2014   Chronic  migraine 02/1985   Depression    hx of   Diabetes mellitus without complication (HCC) 11/2014   Fall    Hyperlipidemia    Hypertension 09/2013   Hypothyroidism    Shoulder pain    Past Surgical History:  Past Surgical History:  Procedure Laterality Date   no previous surgeries      Social History:  reports that she has never smoked. She has never used smokeless tobacco. She reports that she does not drink alcohol and does not use drugs. Family History:  Family History  Problem Relation Age of Onset   Migraines Mother    Esophageal cancer Other    Breast cancer Neg Hx    Colon cancer Neg Hx    Rectal cancer Neg Hx    Stomach cancer Neg Hx      HOME MEDICATIONS: Allergies as of 08/25/2023       Reactions   Cymbalta  [duloxetine  Hcl]    Nausea and dizziness   Elavil  [amitriptyline  Hcl] Diarrhea, Nausea And Vomiting        Medication List        Accurate as of August 25, 2023 11:38 AM. If you have any questions, ask your nurse or doctor.          Accu-Chek Guide Me w/Device Kit Check blood sugar three times daily. Dx E11.42   Accu-Chek Guide test strip Generic drug: glucose blood Check BS three times a day before  meals.  E11.42   Accu-Chek Guide Test test strip Generic drug: glucose blood Test blood sugar 3 (three) times daily before meals.   Accu-Chek Softclix Lancets lancets Use as instructed to Check blood sugar three times daily.  E11.42 (Use as instructed to Check blood sugar three times daily.  E11.42)   amLODipine  10 MG tablet Commonly known as: NORVASC  Take 1 tablet (10 mg total) by mouth daily.   atorvastatin  20 MG tablet Commonly known as: LIPITOR Take 1 tablet (20 mg total) by mouth daily.   BD Pen Needle Nano 2nd Gen 32G X 4 MM Misc Generic drug: Insulin  Pen Needle 1 Device by Other route daily in the afternoon.   empagliflozin  25 MG Tabs tablet Commonly known as: Jardiance  Take 1 tablet (25 mg total) by mouth daily before breakfast.    empagliflozin  25 MG Tabs tablet Commonly known as: JARDIANCE  Take 1 tablet (25 mg total) by mouth daily before breakfast. (Take 1 tablet (25 mg total) by mouth daily before breakfast.)   fluticasone  50 MCG/ACT nasal spray Commonly known as: FLONASE  Place 2 sprays into both nostrils daily.   gabapentin  600 MG tablet Commonly known as: Neurontin  Take 0.5 tablets (300 mg total) by mouth 3 (three) times daily.   Insulin  Glargine Solostar 100 UNIT/ML Solostar Pen Commonly known as: LANTUS  Inject 14 Units into the skin daily.   Lantus  SoloStar 100 UNIT/ML Solostar Pen Generic drug: insulin  glargine Inject 14 Units into the skin daily.   levothyroxine  50 MCG tablet Commonly known as: SYNTHROID  TAKE 1 TABLET BY MOUTH EVERY DAY   loratadine  10 MG tablet Commonly known as: CLARITIN  TAKE 1 TABLET BY MOUTH EVERY DAY AS NEEDED FOR ALLERGY   Meloxicam  15 MG Tbdp Take 15 mg by mouth daily.   metFORMIN  1000 MG tablet Commonly known as: GLUCOPHAGE  Take 1 tablet (1,000 mg total) by mouth 2 (two) times daily with a meal.   triamcinolone  0.025 % ointment Commonly known as: KENALOG  APPLY TO AFFECTED AREA TWICE A DAY   Ubrelvy  50 MG Tabs Generic drug: Ubrogepant  1 tab PO at start of headache. May repeat in 2 hrs if no relief.  Max 2 tabs/24 hrs.   valsartan  40 MG tablet Commonly known as: DIOVAN  Take 0.5 tablets (20 mg total) by mouth daily.         ALLERGIES: Allergies  Allergen Reactions   Cymbalta  [Duloxetine  Hcl]     Nausea and dizziness   Elavil  [Amitriptyline  Hcl] Diarrhea and Nausea And Vomiting        OBJECTIVE:   VITAL SIGNS: BP 134/80 (BP Location: Left Arm, Patient Position: Sitting, Cuff Size: Normal)   Pulse 74   Ht 5' (1.524 m)   Wt 146 lb (66.2 kg)   SpO2 98%   BMI 28.51 kg/m    PHYSICAL EXAM:  General: Pt appears well and is in NAD  Lungs: Clear with good BS bilat   Heart: RRR   Extremities:  Lower extremities - No pretibial edema  Neuro: MS  is good with appropriate affect, pt is alert and Ox3    DM Foot Exam 02/10/2023  The skin of the feet is intact without sores or ulcerations. The pedal pulses are 2+ on right and 2+ on left. The sensation is intact to a screening 5.07, 10 gram monofilament bilaterally     DATA REVIEWED:  Lab Results  Component Value Date   HGBA1C 6.5 07/12/2023   HGBA1C 6.7 (A) 02/10/2023   HGBA1C 7.3 (A) 11/08/2022  Latest Reference Range & Units 03/14/23 16:52  Comprehensive metabolic panel with GFR  Rpt !  Sodium 134 - 144 mmol/L 142  Potassium 3.5 - 5.2 mmol/L 4.7  Chloride 96 - 106 mmol/L 104  CO2 20 - 29 mmol/L 21  Glucose 70 - 99 mg/dL 886 (H)  BUN 8 - 27 mg/dL 13  Creatinine 9.42 - 8.99 mg/dL 9.41  Calcium  8.7 - 10.3 mg/dL 9.8  BUN/Creatinine Ratio 12 - 28  22  eGFR >59 mL/min/1.73 103  Alkaline Phosphatase 44 - 121 IU/L 133 (H)  Albumin 3.9 - 4.9 g/dL 4.6  AST 0 - 40 IU/L 18  ALT 0 - 32 IU/L 20  Total Protein 6.0 - 8.5 g/dL 7.5  Total Bilirubin 0.0 - 1.2 mg/dL 0.3    Latest Reference Range & Units 03/14/23 16:52  Total CHOL/HDL Ratio 0.0 - 4.4 ratio 2.5  Cholesterol, Total 100 - 199 mg/dL 864  HDL Cholesterol >60 mg/dL 55  Triglycerides 0 - 850 mg/dL 661 (H)  VLDL Cholesterol Cal 5 - 40 mg/dL 49 (H)  LDL Chol Calc (NIH) 0 - 99 mg/dL 31     In office BG 186 mg/DL  ASSESSMENT / PLAN / RECOMMENDATIONS:   1) Type 2 Diabetes Mellitus, optimally controlled, With neuropathic  complications - Most recent A1c of 6.7 %. Goal A1c < 7.0 %.     - A1 at goal  - Pt with language barrier - No changes  MEDICATIONS:  -Continue Jardiance   25 mg daily  -Continue Metformin  1000 mg , twice a day  -Continue  Lantus  14 units daily   EDUCATION / INSTRUCTIONS: BG monitoring instructions: Patient is instructed to check her blood sugars 3 times a day, before meals . Call [facility] if: BG persistently < 70  I reviewed the Rule of 15 for the treatment of  hypoglycemia in detail with the patient. Literature supplied.   2) Diabetic complications:  Eye: Does not have known diabetic retinopathy.  Neuro/ Feet: Does  have known diabetic peripheral neuropathy. Renal: Patient does not have known baseline CKD. She is  on an ACEI/ARB at present.     3) Dyslipidemia:  -We had increased her atorvastatin  due to LDL above goal at 109 mg/DL in 02/7975 - LDL at goal, TGs were elevated but suspect this is due to nonfasting status   Medication   Continue atorvastatin  20 mg daily   F/U in 6 months     Signed electronically by: Stefano Redgie Butts, MD  [facility] Endocrinology  [facility] [address] [place] [place], [place] [address] Phone: [phone] FAX: [phone]   CC: April Barnie NOVAK, MD [address] [place] [address] [place] [address] Phone: [phone]  Fax: [phone]    Return to Endocrinology clinic as below: Future Appointments  Date Time Provider Department Center  11/11/2023  2:10 PM April Barnie NOVAK, MD [facility] None

## 2023-08-26 ENCOUNTER — Other Ambulatory Visit (HOSPITAL_COMMUNITY): Payer: Self-pay

## 2023-08-26 ENCOUNTER — Telehealth: Payer: Self-pay | Admitting: Pharmacy Technician

## 2023-08-26 NOTE — Telephone Encounter (Signed)
 Pharmacy Patient Advocate Encounter   Received notification from CoverMyMeds that prior authorization for Semglee  (yfgn) 100UNIT/ML pen-injectors is required/requested.   Insurance verification completed.   The patient is insured through [facility] MEDICAID .   PA not needed. New Rx for Lantus  was just sent to the pharmacy.  Archived Key: [id number]

## 2023-08-31 ENCOUNTER — Ambulatory Visit: Admitting: Internal Medicine

## 2023-09-07 ENCOUNTER — Other Ambulatory Visit: Payer: Self-pay | Admitting: Internal Medicine

## 2023-09-07 DIAGNOSIS — J301 Allergic rhinitis due to pollen: Secondary | ICD-10-CM

## 2023-09-09 ENCOUNTER — Other Ambulatory Visit: Payer: Self-pay | Admitting: Internal Medicine

## 2023-09-19 ENCOUNTER — Other Ambulatory Visit: Payer: Self-pay | Admitting: Internal Medicine

## 2023-09-20 ENCOUNTER — Encounter: Payer: Self-pay | Admitting: Internal Medicine

## 2023-09-21 ENCOUNTER — Other Ambulatory Visit: Payer: Self-pay | Admitting: Internal Medicine

## 2023-09-21 MED ORDER — PEN NEEDLES 31G X 8 MM MISC: 6 refills | Status: DC

## 2023-10-16 ENCOUNTER — Other Ambulatory Visit: Payer: Self-pay | Admitting: Internal Medicine

## 2023-10-16 ENCOUNTER — Encounter: Payer: Self-pay | Admitting: Internal Medicine

## 2023-10-16 DIAGNOSIS — J301 Allergic rhinitis due to pollen: Secondary | ICD-10-CM

## 2023-10-16 MED ORDER — LORATADINE 10 MG PO TABS: 10.0000 mg | ORAL_TABLET | Freq: Every day | ORAL | 1 refills | Status: AC | PRN

## 2023-11-09 ENCOUNTER — Telehealth: Payer: Self-pay | Admitting: Internal Medicine

## 2023-11-09 NOTE — Telephone Encounter (Signed)
 Patient confirmed appointment for 11/11/2023 through volunteer call.

## 2023-11-11 ENCOUNTER — Ambulatory Visit: Admitting: Internal Medicine

## 2023-12-14 ENCOUNTER — Encounter: Payer: Self-pay | Admitting: Internal Medicine

## 2023-12-14 ENCOUNTER — Other Ambulatory Visit (HOSPITAL_COMMUNITY): Payer: Self-pay

## 2023-12-14 ENCOUNTER — Other Ambulatory Visit: Payer: Self-pay | Admitting: Internal Medicine

## 2023-12-14 MED ORDER — ACCU-CHEK GUIDE TEST VI STRP
ORAL_STRIP | Freq: Three times a day (TID) | 1 refills | Status: DC
  Filled 2023-12-14: qty 100, 33d supply, fill #0

## 2023-12-15 ENCOUNTER — Other Ambulatory Visit: Payer: Self-pay | Admitting: Internal Medicine

## 2023-12-15 ENCOUNTER — Other Ambulatory Visit: Payer: Self-pay

## 2023-12-15 DIAGNOSIS — E1142 Type 2 diabetes mellitus with diabetic polyneuropathy: Secondary | ICD-10-CM

## 2023-12-15 MED ORDER — ACCU-CHEK GUIDE W/DEVICE KIT: PACK | 0 refills | Status: DC

## 2023-12-15 MED ORDER — ACCU-CHEK SOFTCLIX LANCETS MISC: 12 refills | Status: AC

## 2023-12-15 MED ORDER — ACCU-CHEK GUIDE TEST VI STRP: ORAL_STRIP | 12 refills | Status: DC

## 2024-01-05 ENCOUNTER — Telehealth: Payer: Self-pay | Admitting: Internal Medicine

## 2024-01-05 NOTE — Telephone Encounter (Signed)
 No notes or information in the system.

## 2024-01-05 NOTE — Telephone Encounter (Signed)
 Copied from CRM #[id number]. Topic: General - Call Back - No Documentation >> Jan 05, 2024  1:26 PM April Mcmahon wrote:  Reason for CRM: Pt has a missed call but show no documentation. Please call pt back on #[phone]

## 2024-02-03 ENCOUNTER — Emergency Department (HOSPITAL_COMMUNITY)

## 2024-02-03 ENCOUNTER — Other Ambulatory Visit: Payer: Self-pay

## 2024-02-03 ENCOUNTER — Emergency Department (HOSPITAL_COMMUNITY): Admission: EM | Admit: 2024-02-03 | Discharge: 2024-02-03 | Disposition: A | Discharge status: Home / Self Care

## 2024-02-03 ENCOUNTER — Encounter (HOSPITAL_COMMUNITY): Payer: Self-pay

## 2024-02-03 DIAGNOSIS — R0789 Other chest pain: Secondary | ICD-10-CM | POA: Diagnosis present

## 2024-02-03 DIAGNOSIS — Z794 Long term (current) use of insulin: Secondary | ICD-10-CM | POA: Diagnosis not present

## 2024-02-03 DIAGNOSIS — Z7984 Long term (current) use of oral hypoglycemic drugs: Secondary | ICD-10-CM | POA: Insufficient documentation

## 2024-02-03 DIAGNOSIS — E039 Hypothyroidism, unspecified: Secondary | ICD-10-CM | POA: Diagnosis not present

## 2024-02-03 DIAGNOSIS — I10 Essential (primary) hypertension: Secondary | ICD-10-CM | POA: Diagnosis not present

## 2024-02-03 DIAGNOSIS — Z79899 Other long term (current) drug therapy: Secondary | ICD-10-CM | POA: Insufficient documentation

## 2024-02-03 DIAGNOSIS — E119 Type 2 diabetes mellitus without complications: Secondary | ICD-10-CM | POA: Insufficient documentation

## 2024-02-03 LAB — COMPREHENSIVE METABOLIC PANEL WITH GFR
ALT: 21 U/L (ref 0–44)
AST: 29 U/L (ref 15–41)
Albumin: 4.5 g/dL (ref 3.5–5.0)
Alkaline Phosphatase: 128 U/L — ABNORMAL HIGH (ref 38–126)
Anion gap: 13 (ref 5–15)
BUN: 12 mg/dL (ref 8–23)
CO2: 19 mmol/L — ABNORMAL LOW (ref 22–32)
Calcium: 9.2 mg/dL (ref 8.9–10.3)
Chloride: 107 mmol/L (ref 98–111)
Creatinine, Ser: 0.58 mg/dL (ref 0.44–1.00)
GFR, Estimated: >60 mL/min
Glucose, Bld: 101 mg/dL — ABNORMAL HIGH (ref 70–99)
Potassium: 4.8 mmol/L (ref 3.5–5.1)
Sodium: 139 mmol/L (ref 135–145)
Total Bilirubin: 0.4 mg/dL (ref 0.0–1.2)
Total Protein: 7.8 g/dL (ref 6.5–8.1)

## 2024-02-03 LAB — CBC WITH DIFFERENTIAL/PLATELET
Abs Immature Granulocytes: 0.01 K/uL (ref 0.00–0.07)
Basophils Absolute: 0 K/uL (ref 0.0–0.1)
Basophils Relative: 1 %
Eosinophils Absolute: 0.3 K/uL (ref 0.0–0.5)
Eosinophils Relative: 4 %
HCT: 42.9 % (ref 36.0–46.0)
Hemoglobin: 13.3 g/dL (ref 12.0–15.0)
Immature Granulocytes: 0 %
Lymphocytes Relative: 35 %
Lymphs Abs: 2.9 K/uL (ref 0.7–4.0)
MCH: 25.9 pg — ABNORMAL LOW (ref 26.0–34.0)
MCHC: 31 g/dL (ref 30.0–36.0)
MCV: 83.5 fL (ref 80.0–100.0)
Monocytes Absolute: 0.4 K/uL (ref 0.1–1.0)
Monocytes Relative: 5 %
Neutro Abs: 4.6 K/uL (ref 1.7–7.7)
Neutrophils Relative %: 55 %
Platelets: ADEQUATE K/uL (ref 150–400)
RBC: 5.14 MIL/uL — ABNORMAL HIGH (ref 3.87–5.11)
RDW: 13.8 % (ref 11.5–15.5)
WBC: 8.3 K/uL (ref 4.0–10.5)
nRBC: 0 % (ref 0.0–0.2)

## 2024-02-03 LAB — I-STAT CHEM 8, ED
BUN: 13 mg/dL (ref 8–23)
Calcium, Ion: 1.05 mmol/L — ABNORMAL LOW (ref 1.15–1.40)
Chloride: 111 mmol/L (ref 98–111)
Creatinine, Ser: 0.5 mg/dL (ref 0.44–1.00)
Glucose, Bld: 95 mg/dL (ref 70–99)
HCT: 38 % (ref 36.0–46.0)
Hemoglobin: 12.9 g/dL (ref 12.0–15.0)
Potassium: 3.9 mmol/L (ref 3.5–5.1)
Sodium: 143 mmol/L (ref 135–145)
TCO2: 21 mmol/L — ABNORMAL LOW (ref 22–32)

## 2024-02-03 LAB — TROPONIN T, HIGH SENSITIVITY
Troponin T High Sensitivity: <15 ng/L (ref 0–19)
Troponin T High Sensitivity: <15 ng/L (ref 0–19)

## 2024-02-03 LAB — PRO BRAIN NATRIURETIC PEPTIDE: Pro Brain Natriuretic Peptide: <50 pg/mL

## 2024-02-03 MED ORDER — LIDOCAINE 5 % EX PTCH
1.0000 | MEDICATED_PATCH | Freq: Once | CUTANEOUS | Status: DC
  Administered 2024-02-03: 1 via TRANSDERMAL
  Filled 2024-02-03: qty 1

## 2024-02-03 MED ORDER — KETOROLAC TROMETHAMINE 15 MG/ML IJ SOLN
15.0000 mg | Freq: Once | INTRAMUSCULAR | Status: AC
  Administered 2024-02-03: 15 mg via INTRAMUSCULAR
  Filled 2024-02-03: qty 1

## 2024-02-03 MED ORDER — SODIUM CHLORIDE 0.9 % IV BOLUS
500.0000 mL | Freq: Once | INTRAVENOUS | Status: AC
  Administered 2024-02-03: 500 mL via INTRAVENOUS

## 2024-02-03 MED ORDER — ONDANSETRON HCL 4 MG/2ML IJ SOLN
4.0000 mg | Freq: Once | INTRAMUSCULAR | Status: AC
  Administered 2024-02-03: 4 mg via INTRAVENOUS
  Filled 2024-02-03: qty 2

## 2024-02-03 MED ORDER — IOHEXOL 350 MG/ML SOLN
75.0000 mL | Freq: Once | INTRAVENOUS | Status: AC | PRN
  Administered 2024-02-03: 75 mL via INTRAVENOUS

## 2024-02-03 MED ORDER — MORPHINE SULFATE (PF) 2 MG/ML IV SOLN
4.0000 mg | Freq: Once | INTRAVENOUS | Status: AC
  Administered 2024-02-03: 4 mg via INTRAVENOUS
  Filled 2024-02-03: qty 2

## 2024-02-03 NOTE — ED Provider Notes (Signed)
 " [place] [facility] Provider Note   CSN: [id number] Arrival date & time: 02/03/24  9061     Patient presents with: Chest Pain  HPI April Mcmahon is a 64 y.o. female with type 2 diabetes, hypertension, hyperlipidemia, hypothyroidism presenting for chest pain.  Started around 2 AM this morning.  Pain is in the center of her chest but does radiate rightward.  Worse with movement.  Denies associated shortness of breath.  Not worse with exertion.  Denies abdominal pain vomiting or urinary symptoms.  Did receive 324 mg of aspirin and nitro x 2 and route and did have some improvement of pain.    Chest Pain      Prior to Admission medications  Medication Sig Start Date End Date Taking? Authorizing Provider  Accu-Chek Softclix Lancets lancets Use as instructed  to check BS three times a day 12/15/23   Vicci Barnie NOVAK, MD  Blood Glucose Monitoring Suppl (ACCU-CHEK GUIDE) w/Device KIT Use to check BS 3 times a day 12/15/23   Vicci Barnie NOVAK, MD  glucose blood (ACCU-CHEK GUIDE TEST) test strip Use as instructed to check BS three times a day 12/15/23   Vicci Barnie NOVAK, MD  amLODipine  (NORVASC ) 10 MG tablet Take 1 tablet (10 mg total) by mouth daily. 03/14/23   Vicci Barnie NOVAK, MD  atorvastatin  (LIPITOR) 20 MG tablet TAKE 1 TABLET BY MOUTH EVERY DAY 09/09/23   Shamleffer, Ibtehal Jaralla, MD  Blood Glucose Monitoring Suppl (ACCU-CHEK GUIDE ME) w/Device KIT Check blood sugar three times daily. Dx E11.42 10/01/20   Vicci Barnie NOVAK, MD  empagliflozin  (JARDIANCE ) 25 MG TABS tablet Take 1 tablet (25 mg total) by mouth daily before breakfast. 08/25/23   Shamleffer, Donell Cardinal, MD  fluticasone  (FLONASE ) 50 MCG/ACT nasal spray Place 2 sprays into both nostrils daily. 03/13/21   Fleming, Zelda W, NP  gabapentin  (NEURONTIN ) 600 MG tablet Take 0.5 tablets (300 mg total) by mouth 3 (three) times daily. 03/13/21 01/27/22  Fleming, Zelda W, NP  insulin  glargine  (LANTUS  SOLOSTAR) 100 UNIT/ML Solostar Pen Inject 14 Units into the skin daily. 08/25/23   Shamleffer, Ibtehal Jaralla, MD  Insulin  Glargine Solostar (LANTUS ) 100 UNIT/ML Solostar Pen Inject 14 Units into the skin daily. 08/25/23   Shamleffer, Ibtehal Jaralla, MD  Insulin  Pen Needle (PEN NEEDLES) 31G X 8 MM MISC UAD to inject insulin  once daily 09/21/23   Vicci Barnie NOVAK, MD  levothyroxine  (SYNTHROID ) 50 MCG tablet TAKE 1 TABLET BY MOUTH EVERY DAY 08/15/23   Vicci Barnie NOVAK, MD  loratadine  (CLARITIN ) 10 MG tablet Take 1 tablet (10 mg total) by mouth daily as needed for allergies. 10/16/23   Vicci Barnie NOVAK, MD  Meloxicam  15 MG TBDP Take 15 mg by mouth daily. 02/02/22   Vicci Barnie NOVAK, MD  metFORMIN  (GLUCOPHAGE ) 1000 MG tablet Take 1 tablet (1,000 mg total) by mouth 2 (two) times daily with a meal. 08/25/23   Shamleffer, Donell Cardinal, MD  triamcinolone  (KENALOG ) 0.025 % ointment APPLY TO AFFECTED AREA TWICE A DAY 03/17/23   Vicci Barnie NOVAK, MD  Ubrogepant  (UBRELVY ) 50 MG TABS 1 tab PO at start of headache. May repeat in 2 hrs if no relief.  Max 2 tabs/24 hrs. 07/12/23   Vicci Barnie NOVAK, MD  valsartan  (DIOVAN ) 40 MG tablet Take 0.5 tablets (20 mg total) by mouth daily. 03/14/23   Vicci Barnie NOVAK, MD  venlafaxine  (EFFEXOR ) 37.5 MG tablet Take 1 tablet (37.5 mg total) by  mouth 2 (two) times daily with a meal. 02/09/17 12/14/18  Onita Duos, MD    Allergies: Cymbalta  [duloxetine  hcl] and Elavil  [amitriptyline  hcl]    Review of Systems  Cardiovascular:  Positive for chest pain.    Updated Vital Signs BP (!) 104/54   Pulse 71   Temp 98 F (36.7 C)   Resp 18   Ht 5' (1.524 m)   Wt 66.2 kg   SpO2 99%   BMI 28.51 kg/m   Physical Exam Vitals and nursing note reviewed.  HENT:     Head: Normocephalic and atraumatic.     Mouth/Throat:     Mouth: Mucous membranes are moist.  Eyes:     General:        Right eye: No discharge.        Left eye: No discharge.     Conjunctiva/sclera:  Conjunctivae normal.  Cardiovascular:     Rate and Rhythm: Normal rate and regular rhythm.     Pulses: Normal pulses.     Heart sounds: Normal heart sounds.  Pulmonary:     Effort: Pulmonary effort is normal.     Breath sounds: Normal breath sounds.  Chest:     Comments: Reproducible chest wall tenderness with movement Abdominal:     General: Abdomen is flat. There is no distension.     Palpations: Abdomen is soft.     Tenderness: There is no abdominal tenderness.  Skin:    General: Skin is warm and dry.  Neurological:     General: No focal deficit present.  Psychiatric:        Mood and Affect: Mood normal.     (all labs ordered are listed, but only abnormal results are displayed) Labs Reviewed  COMPREHENSIVE METABOLIC PANEL WITH GFR - Abnormal; Notable for the following components:      Result Value   CO2 19 (*)    Glucose, Bld 101 (*)    Alkaline Phosphatase 128 (*)    All other components within normal limits  CBC WITH DIFFERENTIAL/PLATELET - Abnormal; Notable for the following components:   RBC 5.14 (*)    MCH 25.9 (*)    All other components within normal limits  I-STAT CHEM 8, ED - Abnormal; Notable for the following components:   Calcium , Ion 1.05 (*)    TCO2 21 (*)    All other components within normal limits  PRO BRAIN NATRIURETIC PEPTIDE  TROPONIN T, HIGH SENSITIVITY  TROPONIN T, HIGH SENSITIVITY    EKG: EKG Interpretation Date/Time:  Friday February 03 2024 09:48:12 EST Ventricular Rate:  81 PR Interval:  168 QRS Duration:  86 QT Interval:  372 QTC Calculation: 432 R Axis:   -79  Text Interpretation: Normal sinus rhythm Left axis deviation Low voltage QRS Cannot rule out Inferior infarct , age undetermined Cannot rule out Anterior infarct , age undetermined Abnormal ECG When compared with ECG of 11-Mar-2021 17:09, PREVIOUS ECG IS PRESENT similar to prior Confirmed by Elnor Savant ([id number]) on 02/03/2024 9:49:38 AM  Radiology: CT Angio Chest PE W and/or Wo  Contrast Result Date: 02/03/2024 CLINICAL DATA:  Chest pain EXAM: CT ANGIOGRAPHY CHEST WITH CONTRAST TECHNIQUE: Multidetector CT imaging of the chest was performed using the standard protocol during bolus administration of intravenous contrast. Multiplanar CT image reconstructions and MIPs were obtained to evaluate the vascular anatomy. RADIATION DOSE REDUCTION: This exam was performed according to the departmental dose-optimization program which includes automated exposure control, adjustment of the mA and/or kV according to  patient size and/or use of iterative reconstruction technique. CONTRAST:  75mL OMNIPAQUE  IOHEXOL  350 MG/ML SOLN COMPARISON:  None Available. FINDINGS: Cardiovascular: Satisfactory opacification of the pulmonary arteries to the segmental level. No evidence of pulmonary embolism. Normal heart size. No pericardial effusion. Mediastinum/Nodes: No enlarged mediastinal, hilar, or axillary lymph nodes. Thyroid  gland, trachea, and esophagus demonstrate no significant findings. Lungs/Pleura: Scattered calcified granulomas. No further follow-up imaging is recommended. Mild dependent atelectasis in both lower lobes. Trace right pleural effusion. Upper Abdomen: No acute abnormality. Musculoskeletal: No chest wall abnormality. No acute or significant osseous findings. Review of the MIP images confirms the above findings. IMPRESSION: 1. Negative for acute pulmonary embolism. 2. Trace right pleural effusion. 3. Mild bilateral lower lobe dependent atelectasis without focal infiltrate. Electronically Signed   By: Wilkie Lent M.D.   On: 02/03/2024 13:12   DG Chest 1 View Result Date: 02/03/2024 EXAM: 1 VIEW(S) XRAY OF THE CHEST 02/03/2024 10:35:00 AM COMPARISON: None available. CLINICAL HISTORY: Chest pain FINDINGS: LUNGS AND PLEURA: Low lung volumes with bronchovascular crowding. No pleural effusion. No pneumothorax. HEART AND MEDIASTINUM: No acute abnormality of the cardiac and mediastinal silhouettes.  BONES AND SOFT TISSUES: No acute osseous abnormality. IMPRESSION: 1. Low lung volumes with bronchovascular crowding. Electronically signed by: Morgane Naveau MD 02/03/2024 11:29 AM EST RP Workstation: [id number]     Procedures   Medications Ordered in the ED  lidocaine  (LIDODERM ) 5 % 1 patch (1 patch Transdermal Patch Applied 02/03/24 1045)  ketorolac  (TORADOL ) 15 MG/ML injection 15 mg (has no administration in time range)  morphine  (PF) 2 MG/ML injection 4 mg (4 mg Intravenous Given 02/03/24 1045)  ondansetron  (ZOFRAN ) injection 4 mg (4 mg Intravenous Given 02/03/24 1047)  sodium chloride  0.9 % bolus 500 mL (500 mLs Intravenous New Bag/Given 02/03/24 1044)  iohexol  (OMNIPAQUE ) 350 MG/ML injection 75 mL (75 mLs Intravenous Contrast Given 02/03/24 1157)                                    Medical Decision Making  Initial Impression and Ddx 64 year old well-appearing female presenting for chest pain. Exam unremarkable.  DDx includes PE, ACS, acute cholecystitis, aortic dissection, pneumothorax, other. Patient PMH that increases complexity of ED encounter: type 2 diabetes, hypertension, hyperlipidemia, hypothyroidism   Interpretation of Diagnostics - I independent reviewed and interpreted the labs as followed: alk phos 128  - I independently visualized the following imaging with scope of interpretation limited to determining acute life threatening conditions related to emergency care: CT angio chest, which was negative for PE, trace pleural effusion, lower lobe atelectasis  - I personally reviewed and interpreted EKG which revealed NSR  Patient Reassessment and Ultimate Disposition/Management Overall workup reassuring.  Suspect MSK etiology given reproducible chest pain worse with movement.  Advised NSAIDs and supportive care and to follow-up with her PCP.  Discussed return precautions.  Discharged good condition.  Patient management required discussion with the following services or consulting  groups:  None  Complexity of Problems Addressed Acute complicated illness or Injury  Additional Data Reviewed and Analyzed Further history obtained from: Past medical history and medications listed in the EMR and Prior ED visit notes  Patient Encounter Risk Assessment Consideration of hospitalization      Final diagnoses:  Atypical chest pain    ED Discharge Orders     None          Lang Norleen POUR, PA-C 02/03/24 1545  Ula Prentice SAUNDERS, MD 02/04/24 0004  "

## 2024-02-03 NOTE — ED Provider Triage Note (Addendum)
 Emergency Medicine Provider Triage Evaluation Note  April Mcmahon , a 64 y.o. female  was evaluated in triage.  Pt complains of cp/back pain  Pt reports around 2 AM this morning she began having upper back pain rating to her chest.  Described as sharp and stabbing pain.  Worse with torso movement or deep inspiration.  Not characterized as ripping or tearing.  She has some difficulty breathing secondary to the pain.  No nausea, vomiting or lightheadedness.  No numbness or weakness to her extremities that seems new.  No trauma.  No abdominal pain.  Reports pain somewhat improved after aspirin, nitroglycerin from EMS. Does not f/w cardiology  Pain to back is reproducible w/ palpation, paraspinal. Approx area of infraspinatus. No midline TTP no crepitance or stepoff, no c - spine ttp or HA  Pulses equal to extremities    Interpreter #[id number].  Review of Systems  Positive: cp Negative: vomiting  Physical Exam  BP 112/65 (BP Location: Left Arm)   Pulse 100   Temp 98.3 F (36.8 C)   Resp 18   Ht 5' (1.524 m)   Wt 66.2 kg   SpO2 96% Comment: Simultaneous filing. User may not have seen previous data.  BMI 28.51 kg/m  Gen:   Awake, no distress   Resp:  Normal effort  MSK:   Moves extremities without difficulty  Other:  No MGR, RRR, normal s1/s2  Medical Decision Making  Medically screening exam initiated at 10:02 AM.  Appropriate orders placed.  April Mcmahon was informed that the remainder of the evaluation will be completed by another provider, this initial triage assessment does not replace that evaluation, and the importance of remaining in the ED until their evaluation is complete.       April Jayson LABOR, DO 02/03/24 1005

## 2024-02-03 NOTE — ED Triage Notes (Signed)
 Patient bib [facility] from home with complaints of Chest pain Pain was sudden onset at 2am woke her up out of her sleep.  It has been worsening since and 45 minutes ago it got really bad. Pain is sharp 10/10 and radiates from her back around to the right side of her chest and into her neck no cardiac history.   She got 324 asa and 2 nitros enroute with improvement of pain.

## 2024-02-03 NOTE — Discharge Instructions (Signed)
 Evaluation today for your chest pain was overall reassuring.  This could be muscular pain.  Recommend ibuprofen  and Tylenol  and to follow-up with your PCP.  If your chest pain worsens, changes if you develop shortness of breath, calf tenderness or swelling or any other concerning symptom please return to the ED for further evaluation.

## 2024-02-13 ENCOUNTER — Inpatient Hospital Stay: Payer: Self-pay | Admitting: Family Medicine

## 2024-02-16 ENCOUNTER — Other Ambulatory Visit: Payer: Self-pay | Admitting: Internal Medicine

## 2024-02-16 DIAGNOSIS — E039 Hypothyroidism, unspecified: Secondary | ICD-10-CM

## 2024-02-17 ENCOUNTER — Other Ambulatory Visit: Payer: Self-pay | Admitting: Internal Medicine

## 2024-02-17 ENCOUNTER — Ambulatory Visit: Payer: Self-pay | Attending: Internal Medicine | Admitting: Internal Medicine

## 2024-02-17 DIAGNOSIS — E1142 Type 2 diabetes mellitus with diabetic polyneuropathy: Secondary | ICD-10-CM

## 2024-02-27 ENCOUNTER — Ambulatory Visit: Admitting: Internal Medicine

## 2024-03-02 ENCOUNTER — Encounter: Payer: Self-pay | Admitting: Internal Medicine

## 2024-03-02 ENCOUNTER — Ambulatory Visit: Admitting: Internal Medicine

## 2024-03-02 ENCOUNTER — Other Ambulatory Visit

## 2024-03-02 VITALS — BP 120/70 | Ht 60.0 in | Wt 147.0 lb

## 2024-03-02 DIAGNOSIS — E1142 Type 2 diabetes mellitus with diabetic polyneuropathy: Secondary | ICD-10-CM

## 2024-03-02 DIAGNOSIS — E039 Hypothyroidism, unspecified: Secondary | ICD-10-CM

## 2024-03-02 DIAGNOSIS — E785 Hyperlipidemia, unspecified: Secondary | ICD-10-CM

## 2024-03-02 LAB — POCT GLYCOSYLATED HEMOGLOBIN (HGB A1C): Hemoglobin A1C: 6.4 % — AB (ref 4.0–5.6)

## 2024-03-02 MED ORDER — ATORVASTATIN CALCIUM 20 MG PO TABS: 20.0000 mg | ORAL_TABLET | Freq: Every day | ORAL | 2 refills | Status: AC

## 2024-03-02 MED ORDER — INSULIN GLARGINE SOLOSTAR 100 UNIT/ML ~~LOC~~ SOPN: 12.0000 [IU] | PEN_INJECTOR | Freq: Every day | SUBCUTANEOUS | 3 refills | Status: AC

## 2024-03-02 MED ORDER — ACCU-CHEK GUIDE TEST VI STRP: ORAL_STRIP | 12 refills | Status: AC

## 2024-03-02 MED ORDER — BD PEN NEEDLE MINI U/F 31G X 5 MM MISC: 1.0000 | Freq: Every day | 3 refills | Status: AC

## 2024-03-02 MED ORDER — METFORMIN HCL 1000 MG PO TABS: 1000.0000 mg | ORAL_TABLET | Freq: Two times a day (BID) | ORAL | 3 refills | Status: AC

## 2024-03-02 MED ORDER — EMPAGLIFLOZIN 25 MG PO TABS: 25.0000 mg | ORAL_TABLET | Freq: Every day | ORAL | 3 refills | Status: AC

## 2024-03-02 NOTE — Patient Instructions (Signed)
" °-   Continue Jardiance  25 mg daily  - Continue Metformin  1000 mg , twice a day  - Decrease Lantus  12 units once daily  - Continue Atorvastatin  to 20 mg daily     HOW TO TREAT LOW BLOOD SUGARS (Blood sugar LESS THAN 70 MG/DL) Please follow the RULE OF 15 for the treatment of hypoglycemia treatment (when your (blood sugars are less than 70 mg/dL)   STEP 1: Take 15 grams of carbohydrates when your blood sugar is low, which includes:  3-4 GLUCOSE TABS  OR 3-4 OZ OF JUICE OR REGULAR SODA OR ONE TUBE OF GLUCOSE GEL    STEP 2: RECHECK blood sugar in 15 MINUTES STEP 3: If your blood sugar is still low at the 15 minute recheck --> then, go back to STEP 1 and treat AGAIN with another 15 grams of carbohydrates. "

## 2024-03-03 LAB — LIPID PANEL
Cholesterol: 113 mg/dL
HDL: 54 mg/dL
LDL Cholesterol (Calc): 27 mg/dL
Non-HDL Cholesterol (Calc): 59 mg/dL
Total CHOL/HDL Ratio: 2.1 (calc)
Triglycerides: 276 mg/dL — ABNORMAL HIGH

## 2024-03-03 LAB — TSH: TSH: 56.89 m[IU]/L — ABNORMAL HIGH (ref 0.40–4.50)

## 2024-03-03 LAB — MICROALBUMIN / CREATININE URINE RATIO
Creatinine, Urine: 36 mg/dL (ref 20–275)
Microalb Creat Ratio: 25 mg/g{creat}
Microalb, Ur: 0.9 mg/dL

## 2024-03-06 ENCOUNTER — Ambulatory Visit: Payer: Self-pay | Admitting: Internal Medicine

## 2024-03-06 MED ORDER — LEVOTHYROXINE SODIUM 75 MCG PO TABS: 75.0000 ug | ORAL_TABLET | Freq: Every day | ORAL | 3 refills | Status: AC

## 2024-04-05 ENCOUNTER — Ambulatory Visit: Admitting: Internal Medicine

## 2024-05-11 ENCOUNTER — Other Ambulatory Visit

## 2024-08-27 ENCOUNTER — Ambulatory Visit: Admitting: Internal Medicine
